# Patient Record
Sex: Female | Born: 1965 | Race: White | Hispanic: No | Smoking: Former smoker
Health system: Southern US, Community
[De-identification: ages and names within clinical notes are randomized; demographics above are authoritative.]

## PROBLEM LIST (undated history)

## (undated) DIAGNOSIS — F419 Anxiety disorder, unspecified: Secondary | ICD-10-CM

## (undated) DIAGNOSIS — K219 Gastro-esophageal reflux disease without esophagitis: Secondary | ICD-10-CM

## (undated) DIAGNOSIS — K7581 Nonalcoholic steatohepatitis (NASH): Secondary | ICD-10-CM

## (undated) DIAGNOSIS — E785 Hyperlipidemia, unspecified: Secondary | ICD-10-CM

## (undated) DIAGNOSIS — Z8719 Personal history of other diseases of the digestive system: Secondary | ICD-10-CM

## (undated) DIAGNOSIS — I2510 Atherosclerotic heart disease of native coronary artery without angina pectoris: Secondary | ICD-10-CM

## (undated) DIAGNOSIS — G629 Polyneuropathy, unspecified: Secondary | ICD-10-CM

## (undated) DIAGNOSIS — I255 Ischemic cardiomyopathy: Secondary | ICD-10-CM

## (undated) DIAGNOSIS — D763 Other histiocytosis syndromes: Secondary | ICD-10-CM

## (undated) DIAGNOSIS — D72829 Elevated white blood cell count, unspecified: Secondary | ICD-10-CM

## (undated) DIAGNOSIS — R001 Bradycardia, unspecified: Secondary | ICD-10-CM

## (undated) DIAGNOSIS — A0472 Enterocolitis due to Clostridium difficile, not specified as recurrent: Secondary | ICD-10-CM

## (undated) DIAGNOSIS — M797 Fibromyalgia: Secondary | ICD-10-CM

## (undated) DIAGNOSIS — J069 Acute upper respiratory infection, unspecified: Secondary | ICD-10-CM

## (undated) HISTORY — PX: SPLENECTOMY: SUR1306

## (undated) HISTORY — PX: ABDOMINAL HYSTERECTOMY: SHX81

## (undated) HISTORY — PX: TONSILLECTOMY: SUR1361

## (undated) HISTORY — DX: Elevated white blood cell count, unspecified: D72.829

---

## 1997-10-30 ENCOUNTER — Other Ambulatory Visit: Admission: RE | Admit: 1997-10-30 | Discharge: 1997-10-30 | Payer: Self-pay | Admitting: *Deleted

## 1999-04-29 ENCOUNTER — Other Ambulatory Visit: Admission: RE | Admit: 1999-04-29 | Discharge: 1999-04-29 | Payer: Self-pay | Admitting: *Deleted

## 2003-01-03 ENCOUNTER — Emergency Department (HOSPITAL_COMMUNITY): Admission: AD | Admit: 2003-01-03 | Discharge: 2003-01-03 | Payer: Self-pay | Admitting: Emergency Medicine

## 2005-06-16 ENCOUNTER — Other Ambulatory Visit: Admission: RE | Admit: 2005-06-16 | Discharge: 2005-06-16 | Payer: Self-pay | Admitting: General Surgery

## 2006-09-19 ENCOUNTER — Emergency Department (HOSPITAL_COMMUNITY): Admission: EM | Admit: 2006-09-19 | Discharge: 2006-09-19 | Payer: Self-pay | Admitting: Emergency Medicine

## 2007-02-25 ENCOUNTER — Ambulatory Visit (HOSPITAL_COMMUNITY): Payer: Self-pay | Admitting: Oncology

## 2007-02-25 ENCOUNTER — Encounter (HOSPITAL_COMMUNITY): Admission: RE | Admit: 2007-02-25 | Discharge: 2007-03-27 | Payer: Self-pay | Admitting: Oncology

## 2007-04-06 ENCOUNTER — Encounter (HOSPITAL_COMMUNITY): Admission: RE | Admit: 2007-04-06 | Discharge: 2007-04-20 | Payer: Self-pay | Admitting: Oncology

## 2007-05-31 ENCOUNTER — Encounter (HOSPITAL_COMMUNITY): Admission: RE | Admit: 2007-05-31 | Discharge: 2007-06-30 | Payer: Self-pay | Admitting: Oncology

## 2007-06-07 ENCOUNTER — Ambulatory Visit (HOSPITAL_COMMUNITY): Payer: Self-pay | Admitting: Oncology

## 2007-07-01 ENCOUNTER — Encounter (INDEPENDENT_AMBULATORY_CARE_PROVIDER_SITE_OTHER): Payer: Self-pay | Admitting: Surgery

## 2007-07-01 ENCOUNTER — Inpatient Hospital Stay (HOSPITAL_COMMUNITY): Admission: RE | Admit: 2007-07-01 | Discharge: 2007-07-07 | Payer: Self-pay | Admitting: Surgery

## 2007-07-14 ENCOUNTER — Encounter: Admission: RE | Admit: 2007-07-14 | Discharge: 2007-07-14 | Payer: Self-pay | Admitting: Surgery

## 2007-07-18 ENCOUNTER — Encounter (HOSPITAL_COMMUNITY): Admission: RE | Admit: 2007-07-18 | Discharge: 2007-08-17 | Payer: Self-pay | Admitting: Oncology

## 2007-08-09 ENCOUNTER — Ambulatory Visit (HOSPITAL_COMMUNITY): Payer: Self-pay | Admitting: Oncology

## 2007-08-15 ENCOUNTER — Inpatient Hospital Stay (HOSPITAL_COMMUNITY): Admission: EM | Admit: 2007-08-15 | Discharge: 2007-08-29 | Payer: Self-pay | Admitting: Emergency Medicine

## 2007-08-15 ENCOUNTER — Ambulatory Visit: Payer: Self-pay | Admitting: Oncology

## 2007-08-31 ENCOUNTER — Encounter (HOSPITAL_COMMUNITY): Admission: RE | Admit: 2007-08-31 | Discharge: 2007-09-30 | Payer: Self-pay | Admitting: Oncology

## 2008-02-02 ENCOUNTER — Encounter (HOSPITAL_COMMUNITY): Admission: RE | Admit: 2008-02-02 | Discharge: 2008-03-03 | Payer: Self-pay | Admitting: Oncology

## 2008-08-27 ENCOUNTER — Ambulatory Visit (HOSPITAL_COMMUNITY): Payer: Self-pay | Admitting: Oncology

## 2008-08-27 ENCOUNTER — Encounter (HOSPITAL_COMMUNITY): Admission: RE | Admit: 2008-08-27 | Discharge: 2008-09-26 | Payer: Self-pay | Admitting: Oncology

## 2008-09-10 ENCOUNTER — Emergency Department (HOSPITAL_COMMUNITY): Admission: EM | Admit: 2008-09-10 | Discharge: 2008-09-10 | Payer: Self-pay | Admitting: Emergency Medicine

## 2008-12-17 ENCOUNTER — Ambulatory Visit: Payer: Self-pay | Admitting: Orthopedic Surgery

## 2008-12-17 DIAGNOSIS — M7710 Lateral epicondylitis, unspecified elbow: Secondary | ICD-10-CM | POA: Insufficient documentation

## 2008-12-26 ENCOUNTER — Encounter (HOSPITAL_COMMUNITY): Admission: RE | Admit: 2008-12-26 | Discharge: 2009-01-17 | Payer: Self-pay | Admitting: Orthopedic Surgery

## 2008-12-26 ENCOUNTER — Encounter: Payer: Self-pay | Admitting: Orthopedic Surgery

## 2009-01-03 ENCOUNTER — Encounter: Payer: Self-pay | Admitting: Orthopedic Surgery

## 2009-01-11 ENCOUNTER — Emergency Department (HOSPITAL_COMMUNITY): Admission: EM | Admit: 2009-01-11 | Discharge: 2009-01-11 | Payer: Self-pay | Admitting: Emergency Medicine

## 2009-01-17 ENCOUNTER — Encounter: Payer: Self-pay | Admitting: Orthopedic Surgery

## 2009-01-23 ENCOUNTER — Encounter (INDEPENDENT_AMBULATORY_CARE_PROVIDER_SITE_OTHER): Payer: Self-pay | Admitting: *Deleted

## 2009-01-23 ENCOUNTER — Ambulatory Visit: Payer: Self-pay | Admitting: Orthopedic Surgery

## 2009-01-30 ENCOUNTER — Encounter: Payer: Self-pay | Admitting: Orthopedic Surgery

## 2009-03-13 ENCOUNTER — Encounter: Payer: Self-pay | Admitting: Orthopedic Surgery

## 2009-05-02 ENCOUNTER — Emergency Department (HOSPITAL_COMMUNITY): Admission: EM | Admit: 2009-05-02 | Discharge: 2009-05-02 | Payer: Self-pay | Admitting: Emergency Medicine

## 2010-05-11 ENCOUNTER — Encounter (HOSPITAL_COMMUNITY): Payer: Self-pay | Admitting: Oncology

## 2010-05-22 NOTE — Assessment & Plan Note (Signed)
Summary: 6 WK RE-CK RT ARM/[place] MEDICAID/CAF   Visit Type:  Follow-up Referring Provider:  Dr Rocco Serene   CC:  right elbow.  History of Present Illness: I saw [name] in the office today for a followup visit.  She is a 45 years old woman with the complaint of:  RIGHT ELBOW.  DX:   TENNIS ELBOW.  Treatment:   PT,  INJECTION, and brace   MEDS: RELAFEN 750 MG.  Complaints:   SHE STATES THAT HER ELBOW IS NOT ANY BETTER.  Today, scheduled for:   RECHECK.  any numbness ? YES SMALL, RING, AND LONG     Shoulder/Elbow Exam  General:    This is a normally developed, well-nourished, well-dressed female, who appears to be in some distress. She has pain in the RIGHT forearm and the extensors of the wrist. There is tenderness in the extensor carpi radialis, longus and brevis. There is numbness and tingling in the small, ring, and long finger. There is mild tenderness over the lateral epicondyle. There are no skin changes. Post and perfusion are normal.  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  Orders: Est. Patient Level III (64403)  Medications Added to Medication List This Visit: 1)  Neurontin 100 Mg Caps (Gabapentin) .Marland Kitchen.. 1 by mouth three times a day  Patient Instructions: 1)  NCS  2)  WRIST SPLINT FULL TIME  3)  NEURONTIN 100 MG three times a day  4)  RETURN WITH NCS REPORT  Prescriptions: NEURONTIN 100 MG CAPS (GABAPENTIN) 1 by mouth three times a day  #60 x 1   Entered and Authorized by:   Fuller Canada MD   Signed by:   Fuller Canada MD on 01/23/2009   Method used:   Print then Give to Patient   RxID:   [id number]

## 2010-05-22 NOTE — Letter (Signed)
Summary: History form  History form   Imported By: Jacklynn Ganong 12/25/2008 14:16:44  _____________________________________________________________________  External Attachment:    Type:   Image     Comment:   External Document

## 2010-05-22 NOTE — Miscellaneous (Signed)
Summary: ncs uppers faxed to [place]  Clinical Lists Changes  Orders: Added new Referral order of Neurology Referral (Neuro) - Signed

## 2010-05-22 NOTE — Miscellaneous (Signed)
Summary: OT Referral form  OT Referral form   Imported By: Jacklynn Ganong 01/08/2009 10:44:12  _____________________________________________________________________  External Attachment:    Type:   Image     Comment:   External Document

## 2010-05-22 NOTE — Assessment & Plan Note (Signed)
Summary: RT ARM PAIN,TENDONITIS/NEED XRAY/REF [place] MEDICA[place]   Visit Type:  Initial Consult Referring Razi Hickle:  Dr Rocco Serene   CC:  pain RIGHT elbow.  History of Present Illness: chief complaint: pain in RIGHT elbow.    45 year old female complains of several month history of pain over the RIGHT lateral elbow with numbness and radiation into the RIGHT hand, associated with numbness in the small, ring, and long finger. She has tried Relafen 750 mg twice a day and ice with no relief. She cannot lift a coffee cup, temperature teeth. Her pain is rated 8/10 at its worse. She is right-hand dominant.      Past History:  Past Medical History: status post hysterectomy, 1993 status post splenectomy 2009  History diabetes, and hypertension.  Family history of arthritis, diabetes.  Patient is married housewife, does not smoke or drink high school education was completed.  Medications include lisinopril 20 mg one a day. Metformin thousand milligrams 2 a day, and Relafen 750 mg twice a day  Review of Systems General:  Denies weight loss, weight gain, fever, chills, and fatigue. Cardiac :  Complains of blood clots; denies chest pain, angina, heart attack, heart failure, poor circulation, and phlebitis. Resp:  Complains of pneumonia; denies short of breath, difficulty breathing, COPD, and cough. GI:  Denies nausea, vomiting, diarrhea, constipation, difficulty swallowing, ulcers, GERD, and reflux. GU:  Denies kidney failure, kidney transplant, kidney stones, burning, poor stream, testicular cancer, blood in urine, and . Neuro:  Denies headache, dizziness, migraines, numbness, weakness, tremor, and unsteady walking. MS:  Denies joint pain, rheumatoid arthritis, joint swelling, gout, bone cancer, osteoporosis, and . Endo:  Complains of diabetes; denies thyroid disease and goiter. Psych:  Denies depression, mood swings, anxiety, panic attack, bipolar, and schizophrenia. Derm:  Denies  eczema, cancer, and itching. EENT:  Complains of poor vision; denies cataracts, glaucoma, poor hearing, vertigo, ears ringing, sinusitis, hoarseness, toothaches, and bleeding gums. Immunology:  Complains of seasonal allergies and sinus problems; denies allergic to bee stings. Lymphatic:  Denies lymph node cancer and lymph edema.  Physical Exam  Additional Exam:  Is a well-developed, well-nourished, female, abdomen, and hygiene, are intact. She is awake, alert, x3. Mood is normal.  She has normal sensation to soft touch over the RIGHT upper extremity, including the hand. She has good capillary refill and color.  She has a positive power grip test with tenderness over the lateral epicondyle, loss of about 10 of extension compared to her LEFT elbow. Flexion is normal. She is painful supination. Normal pronation.  There are no strength deficits in flexion or extension.  There is no lymphadenopathy or lymphangitis in the RIGHT axilla or epitrochlear region.  Skin is warm, dry, and intact. No rash.  LEFT arm has full range of motion with hyperextension of about 10 strength, stability and alignment of LEFT elbow noted.     Impression & Recommendations:  Problem # 1:  LATERAL EPICONDYLITIS, RIGHT (ICD-726.32) Assessment New  Orders: Physical Therapy Referral (PT) New Patient Level III (16109) Depo- Medrol 40mg  (J1030) Joint Aspirate / Injection, Intermediate (60454)  Medications Added to Medication List This Visit: 1)  Nabumetone 750 Mg Tabs (Nabumetone) .Marland Kitchen.. 1 by mouth two times a day  Patient Instructions: 1)  You have received an injection of cortisone today. You may experience increased pain at the injection site. Apply ice pack to the area for 20 minutes every 2 hours and take 2 xtra strength tylenol every 8 hours. This increased pain will usually resolve  in 24 hours. The injection will take effect in 3-10 days.  2)  PT @ [facility] 3)  Brace  4)  Read Tennis elbow brochure and  follw instructions  5)  return in 6 weeks  6)  continue relafen 750 mg two times a day  Prescriptions: NABUMETONE 750 MG TABS (NABUMETONE) 1 by mouth two times a day  #60 x 1   Entered and Authorized by:   Fuller Canada MD   Signed by:   Fuller Canada MD on 12/17/2008   Method used:   Handwritten   RxID:   [id number]

## 2010-05-22 NOTE — Miscellaneous (Signed)
Summary: OT Progress note  OT Progress note   Imported By: Jacklynn Ganong 01/21/2009 12:30:59  _____________________________________________________________________  External Attachment:    Type:   Image     Comment:   External Document

## 2010-05-22 NOTE — Miscellaneous (Signed)
Summary: Discharge from OT  Discharge from OT   Imported By: Jacklynn Ganong 03/26/2009 15:32:23  _____________________________________________________________________  External Attachment:    Type:   Image     Comment:   External Document

## 2010-05-22 NOTE — Miscellaneous (Signed)
Summary: OT Initial evaluation  OT Initial evaluation   Imported By: Jacklynn Ganong 12/31/2008 13:53:57  _____________________________________________________________________  External Attachment:    Type:   Image     Comment:   External Document

## 2010-07-25 LAB — COMPREHENSIVE METABOLIC PANEL
ALT: 45 U/L — ABNORMAL HIGH (ref 0–35)
AST: 32 U/L (ref 0–37)
Albumin: 4.3 g/dL (ref 3.5–5.2)
Alkaline Phosphatase: 72 U/L (ref 39–117)
BUN: 12 mg/dL (ref 6–23)
CO2: 30 mEq/L (ref 19–32)
Calcium: 10.3 mg/dL (ref 8.4–10.5)
Chloride: 98 mEq/L (ref 96–112)
Creatinine, Ser: 0.65 mg/dL (ref 0.4–1.2)
GFR calc Af Amer: >60 mL/min (ref >60)
GFR calc non Af Amer: >60 mL/min (ref >60)
Glucose, Bld: 197 mg/dL — ABNORMAL HIGH (ref 70–99)
Potassium: 4.1 mEq/L (ref 3.5–5.1)
Sodium: 137 mEq/L (ref 135–145)
Total Bilirubin: 0.4 mg/dL (ref 0.3–1.2)
Total Protein: 8.2 g/dL (ref 6.0–8.3)

## 2010-07-25 LAB — PROTIME-INR
INR: 0.9 (ref 0.00–1.49)
Prothrombin Time: 12.4 seconds (ref 11.6–15.2)

## 2010-07-25 LAB — CBC
HCT: 46.4 % — ABNORMAL HIGH (ref 36.0–46.0)
Hemoglobin: 16.1 g/dL — ABNORMAL HIGH (ref 12.0–15.0)
MCHC: 34.6 g/dL (ref 30.0–36.0)
MCV: 99.3 fL (ref 78.0–100.0)
Platelets: 364 10*3/uL (ref 150–400)
RBC: 4.67 MIL/uL (ref 3.87–5.11)
RDW: 13.8 % (ref 11.5–15.5)
WBC: 15.7 10*3/uL — ABNORMAL HIGH (ref 4.0–10.5)

## 2010-07-25 LAB — APTT: aPTT: 24 seconds (ref 24–37)

## 2010-07-25 LAB — DIFFERENTIAL
Basophils Absolute: 0.1 10*3/uL (ref 0.0–0.1)
Basophils Relative: 0 % (ref 0–1)
Eosinophils Absolute: 0.2 10*3/uL (ref 0.0–0.7)
Eosinophils Relative: 1 % (ref 0–5)
Lymphocytes Relative: 30 % (ref 12–46)
Lymphs Abs: 4.7 10*3/uL — ABNORMAL HIGH (ref 0.7–4.0)
Monocytes Absolute: 1.5 10*3/uL — ABNORMAL HIGH (ref 0.1–1.0)
Monocytes Relative: 10 % (ref 3–12)
Neutro Abs: 9.2 10*3/uL — ABNORMAL HIGH (ref 1.7–7.7)
Neutrophils Relative %: 59 % (ref 43–77)

## 2010-07-25 LAB — LIPASE, BLOOD: Lipase: 34 U/L (ref 11–59)

## 2010-07-29 LAB — COMPREHENSIVE METABOLIC PANEL
ALT: 29 U/L (ref 0–35)
AST: 20 U/L (ref 0–37)
Albumin: 3.9 g/dL (ref 3.5–5.2)
Alkaline Phosphatase: 78 U/L (ref 39–117)
BUN: 9 mg/dL (ref 6–23)
CO2: 24 mEq/L (ref 19–32)
Calcium: 9.4 mg/dL (ref 8.4–10.5)
Chloride: 103 mEq/L (ref 96–112)
Creatinine, Ser: 0.55 mg/dL (ref 0.4–1.2)
GFR calc Af Amer: >60 mL/min (ref >60)
GFR calc non Af Amer: >60 mL/min (ref >60)
Glucose, Bld: 185 mg/dL — ABNORMAL HIGH (ref 70–99)
Potassium: 4 mEq/L (ref 3.5–5.1)
Sodium: 135 mEq/L (ref 135–145)
Total Bilirubin: 0.6 mg/dL (ref 0.3–1.2)
Total Protein: 7.8 g/dL (ref 6.0–8.3)

## 2010-07-29 LAB — CBC
HCT: 44.6 % (ref 36.0–46.0)
Hemoglobin: 15.3 g/dL — ABNORMAL HIGH (ref 12.0–15.0)
MCHC: 34.3 g/dL (ref 30.0–36.0)
MCV: 98.6 fL (ref 78.0–100.0)
Platelets: 310 10*3/uL (ref 150–400)
RBC: 4.53 MIL/uL (ref 3.87–5.11)
RDW: 14.2 % (ref 11.5–15.5)
WBC: 13.7 10*3/uL — ABNORMAL HIGH (ref 4.0–10.5)

## 2010-07-29 LAB — DIFFERENTIAL
Basophils Absolute: 0.1 10*3/uL (ref 0.0–0.1)
Basophils Relative: 1 % (ref 0–1)
Eosinophils Absolute: 0.2 10*3/uL (ref 0.0–0.7)
Eosinophils Relative: 2 % (ref 0–5)
Lymphocytes Relative: 30 % (ref 12–46)
Lymphs Abs: 4.1 10*3/uL — ABNORMAL HIGH (ref 0.7–4.0)
Monocytes Absolute: 1.4 10*3/uL — ABNORMAL HIGH (ref 0.1–1.0)
Monocytes Relative: 10 % (ref 3–12)
Neutro Abs: 7.9 10*3/uL — ABNORMAL HIGH (ref 1.7–7.7)
Neutrophils Relative %: 58 % (ref 43–77)

## 2010-09-02 NOTE — Discharge Summary (Signed)
[name], [name]               ACCOUNT NO.:  [id number]   MEDICAL RECORD NO.:  [id number]          PATIENT TYPE:  INP   LOCATION:  1513                         FACILITY:  [facility]   PHYSICIAN:  Thornton Park. 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  Etiology of pain  uncertain, but thought to be related to her splenic and portal vein  thrombosis.   COURSE IN THE HOSPITAL:  This is a 45 year old lady from [place] who  developed some SMV, and portal vein thrombosis after lap spleen.  She  has developed this progression despite being on Lovenox at home.   She was seen here by Dr. Myrle Sheng and we began her on Coumadin.  Also she  was on underlying IV heparin during that period time.  Dr. __________  evaluated her scans and felt there was no indication for thrombolytics.  She has an underlying diagnosis of sea blue histiocytosis.  She  continued to have on and off bouts of pain and she had CT scans that did  not show any evidence of pancreatitis.  This was corroborated with  normal lipase, amylase studies.  She was frustrated by her pain but she  was reassured that there is nothing else that we had seen. We went over  the procedure in detail and she was finally felt to go home on NSAIDS  along with Percocet.  She was to continue her anticoagulants.  She was  given a prescription for Percocet 7.5/325 (number 40) and Coumadin 7.5  mg, was instructed to check with Dr. Mariel Sleet regarding maintenance of  her anticoagulation.  Condition is stable.      Thornton Park Daphine Deutscher, MD  Electronically Signed     MBM/MEDQ  D:   09/14/2007  T:  09/14/2007  Job:  [id number]   cc:   Ladona Horns. Mariel Sleet, MD  Fax: [phone]

## 2010-09-02 NOTE — Op Note (Signed)
NAMEKAMERYN, DAVERN               ACCOUNT NO.:  [id number]   MEDICAL RECORD NO.:  [id number]          PATIENT TYPE:  AMB   LOCATION:  DAY                          FACILITY:  [facility]   PHYSICIAN:  Thornton Park. Daphine Deutscher, MD  DATE OF BIRTH:  Sep 03, 1965   DATE OF PROCEDURE:  07/01/2007  DATE OF DISCHARGE:                               OPERATIVE REPORT   PREOPERATIVE DIAGNOSIS:  Sea-blue histiocytosis with splenomegaly.   POSTOPERATIVE DIAGNOSIS:  Sea-blue histiocytosis with splenomegaly.   PROCEDURE:  Laparoscopic splenectomy.   SURGEON:  Thornton Park. Daphine Deutscher, MD   ASSISTANT:  Currie Paris, MD   ANESTHESIA:  General.   DESCRIPTION OF PROCEDURE:  Shellyann Wandrey was taken to room 1 on July 01, 2007, and given general anesthesia.  She was placed in the leaning  spleen position using bean bag which is a right lateral decubitus.  The  abdomen and thorax on the left side predominantly were prepped with  Techni-Care and draped sterilely.  The abdomen was entered using a 5  Optiview sort of in the anterior axillary line entering without  difficulty and insufflating the abdomen.  A total of four 5 mm(s) were  placed and this gigantic spleen which was really stuck to everything was  taken down meticulously off the surrounding structures.  I only  encountered a little bit of bleeding on either side.  This was  controlled with a stapler.  I worked around her using an angled 30  degree 5 mm and freed it at the top where it was really intimately  associated with the stomach.   It was mobilized laterally, again using the Harmonic with control of  bleeding.  When I felt like I had completely isolated the pedicle and  felt like the splenic pedicle was away from the spleen I then elected to  enlarge my anterior axillary port going up first to 12 so that I could  insert the stapler.  Using the Covidien first a 45 and then a 60 and  then another 45 vascular staple load I went across the pedicle of  the  spleen.  On the second firing I kind of was coming right along the side  of the stomach but that all appeared to be intact and did not appear to  violate or resect any stomach with it although it was right on the edge.  The spleen was thus detached.  I then exchanged the 12 mm trocar for a  15 mm trocar and was able to insert the extraction bag through that and  was fortunate to be able to scoop up the spleen and get it to fit into  the bag and then bring it out through that port.  I then cut down on the  port, enlarging it to about 3 fingerbreadths and with that I was able to  pull the bag up through using the Xcel hand job port that I put into the  wound and then under very controlled circumstances cut up the spleen and  morcellated it and extracted it into a basin.  We then changed  our  gloves and all the equipment and there was complete isolation of the  splenic material as I removed it.   I repaired the extraction site in multiple layers using a running zero  Vicryl on the peritoneum and muscle layer and then the next muscle layer  of obliques closed with interrupted #1 PDS.  This area was injected with  Marcaine and ultimately closed with 4-0 Vicryl and Dermabond.  In the  meantime I inserted the scope and inspected.  We put in an  NG tube, blew up the stomach and inspected that and all looked good.  No  bleeding was noted and I then extracted the ports and closed these using  4-0 Vicryl as well.  Dermabond was used on all the wounds.  The patient  seemed to tolerate the procedure well and was taken to the recovery room  in satisfactory condition.      Thornton Park Daphine Deutscher, MD  Electronically Signed     MBM/MEDQ  D:  07/01/2007  T:  07/02/2007  Job:  [id number]   cc:   Ladona Horns. Mariel Sleet, MD  Fax: [phone]   [facility]

## 2010-09-02 NOTE — H&P (Signed)
[name], Foster               ACCOUNT NO.:  [id number]   MEDICAL RECORD NO.:  [id number]          PATIENT TYPE:  INP   LOCATION:  1513                         FACILITY:  [facility]   PHYSICIAN:  Alfonse Ras, MD   DATE OF BIRTH:  Nov 26, 1965   DATE OF ADMISSION:  08/15/2007  DATE OF DISCHARGE:                              HISTORY & PHYSICAL   REASON FOR ADMISSION:  Splenic vein and superior mesenteric vein  thrombosis status post laparoscopic splenectomy.   HISTORY OF PRESENT ILLNESS:  The patient is a 45 year old white female  now about 5 weeks status post a laparoscopic assisted splenectomy for  sea-blue histiocytosis.  Postoperatively, the patient did well until  postoperative day #5 when she was having increasing abdominal pain.  CT  scan at that time showed portal vein thrombosis.  The patient was seen  and started on therapeutic Lovenox and discharged home.  The patient has  been maintained on Lovenox without improvement in her symptomatology.  She denies any progression of her symptoms and denies any postprandial  pain.  She denies any nausea, vomiting, or diarrhea.  She denies any  fever, chills, or other constitutional symptoms.   PAST MEDICAL HISTORY:  Significant for and non-insulin dependent  diabetes mellitus and hypertension.   MEDICATIONS:  1. Aleve.  2. Lovenox 90 mg twice a day.  3. Simvastatin 40 mg a day.  4. Glucovance 2.5/500 mg b.i.d.  5. Enalapril 5 mg daily.  6. Hydromorphone 2 mg p.o. q.4 h. p.r.n. pain.   PHYSICAL EXAMINATION:  GENERAL:  On physical exam, she is an age-  appropriate obese white female in no distress.  VITAL SINGS:  Temperature is 98, blood pressure is 121/67, heart rate is  94.  HEENT:  Exam is benign.  Normocephalic and atraumatic.  LUNGS:  Clear to auscultation and percussion.  HEART:  Regular rate and rhythm without murmurs, rubs, or gallops.  ABDOMEN:  Obese, nontender.  She has a well-healed left upper quadrant  scar and  small laparoscopic scars.  No evidence of abdominal wall hernia  defects.  No evidence of fluid wave.   IMPRESSION:  Portal vein thrombosis extending down into the superior  mesenteric vein.   PLAN:  Admission, IV heparin, medical hematology consultation,  interventional radiology consultation for possible thrombolytic therapy.      Alfonse Ras, MD  Electronically Signed     KRE/MEDQ  D:  08/15/2007  T:  08/16/2007  Job:  [id number]   cc:   Ladona Horns. Mariel Sleet, MD  Fax: [phone]   Dani Gobble, MD  Fax: [phone]

## 2010-09-02 NOTE — Discharge Summary (Signed)
NAMECALEB, [name]               ACCOUNT NO.:  [id number]   MEDICAL RECORD NO.:  [id number]          PATIENT TYPE:  INP   LOCATION:  1538                         FACILITY:  [facility]   PHYSICIAN:  Thornton Park. Daphine Deutscher, MD  DATE OF BIRTH:  [birth year]   DATE OF ADMISSION:  07/01/2007  DATE OF DISCHARGE:  07/07/2007                               DISCHARGE SUMMARY   ADMITTING DIAGNOSIS:  Sea-blue histiocytosis.   DISCHARGE DIAGNOSIS:  Sea-blue histiocytosis.   PROCEDURE:  Laparoscopic splenectomy.   COURSE IN THE HOSPITAL:  [name] was an a.m. admission on March 13  and underwent laparoscopic splenectomy.  Postoperatively she did well  except having some pain in her midepigastrium.  I was out of town and  she was followed by Dr. Jamey Ripa and Dr. Johna Sheriff who then saw fit to  discharge her.  Arrangements were made for follow-up with me within the  following week.   FINAL DIAGNOSIS:  Sea-blue histiocytosis status post laparoscopic  splenectomy.      Thornton Park Daphine Deutscher, MD  Electronically Signed     MBM/MEDQ  D:  07/20/2007  T:  07/20/2007  Job:  [id number]   cc:   Ladona Horns. Mariel Sleet, MD  Fax: [phone]

## 2010-09-02 NOTE — Consult Note (Signed)
NAMEMARIANN, [name]               ACCOUNT NO.:  [id number]   MEDICAL RECORD NO.:  [id number]          PATIENT TYPE:  INP   LOCATION:  1513                         FACILITY:  [facility]   PHYSICIAN:  Leighton Roach. Truett Perna, M.D. DATE OF BIRTH:  July 21, 1965   DATE OF CONSULTATION:  08/16/2007  DATE OF DISCHARGE:                                 CONSULTATION   CONSULTING PHYSICIAN:  Dr. Truett Perna.   REASON FOR CONSULTATION:  Superior mesenteric vein, splenic vein  thrombosis.   REFERRING PHYSICIAN:  Dr. Luretha Murphy.   HISTORY OF PRESENT ILLNESS:  [name] is a pleasant, 45 year old  woman, patient of Dr. Mariel Sleet, with a history, recent diagnosis of sea  blue histiocytosis, with splenomegaly and thrombocytopenia, initially  seen by Dr. Mariel Sleet in [place] in March 2009, after undergoing  splenectomy by Dr. Colin Benton. The patient has been on Lovenox until  admission at 90 mg subcu b.i.d.  She was last seen by Dr. Mariel Sleet on  August 09, 2007, at which time, her abdominal pain was slightly better,  but not significantly improved. She was afebrile, and she had no  evidence of bleeding.  She was still tender to palpation in the left  upper quadrant to mild palpation.  A new CT scan was recommended, she  underwent this procedure on admission, after presenting with progressive  abdominal pain. Currently, she is on heparin per pharmacy, at 13.5 mL an  hour.  A new CT of the abdomen and pelvis shows thrombus throughout the  splenic vein, extending into the superior mesenteric vein, with no other  findings or masses.  Interventional radiology is evaluate the patient,  in order to determine if she is a candidate for thrombolytic therapy.  We were asked to see her, while she is being admitted in the hospital.   PAST MEDICAL HISTORY:  1. History of sea blue histiocytosis, as above.  2. NIDDM.  3. Hypertension.  4. Status post fracture of the left fifth metatarsal bone on September 19, 2006.   PAST  SURGICAL HISTORY:  1. Status post laparoscopic splenectomy July 01, 2007, Dr. Colin Benton.  2. Status post hysterectomy.   ALLERGIES:  NKDA.   MEDICATIONS:  1. Heparin per pharmacy.  2. Vasotec 5 mg daily.  3. DiaBeta 2.5 mg b.i.d.  4. Glucophage 500 mg b.i.d.  5. Morphine sulfate 2-4 mg q.4 h p.r.n.   REVIEW OF SYSTEMS:  Remarkable for increased fatigue, abdominal pain  with movement, denies any fever, chills, night sweats, headaches,  confusion, double vision, dysphagia, shortness of breath, productive  cough or chest pain.  Denies any GERD symptoms, vomiting, she has mild  nausea.  She denies any diarrhea, constipation, blood in the stools,  hematuria, dysuria, back pain, numbness or edema.  No weight loss or  failure to thrive.   FAMILY HISTORY:  Mother alive, with a history of psoriatic arthritis,  and elevated blood pressure.  Father alive with a history of diabetes  and peripheral vascular disease.  No sisters.  She has one brother at  52, with a diagnosis of blue histiocytosis,  status post splenectomy.  She has also another brother at 32, with what she calls signs of blue  histiocytosis.  Maternal grandmother has a history of pulmonary  embolism.   SOCIAL HISTORY:  The patient is married, she has four children, in good  health.  No tobacco or alcohol history. Lives in [place].   HEALTH MAINTENANCE:  Remarkable for last flu vaccine about 2 weeks ago,  physical about 2 weeks ago.   PHYSICAL EXAMINATION:  This is a 45 year old white female in no acute  distress, alert and oriented x3.  Blood pressure 122/75, pulse 69, respirations 18, pulse oximetry 97% on  room air, temperature 98, weight 192 pounds, height 66 inches.  HEENT:  Normocephalic, atraumatic.  PERRLA.  Oral mucosa without thrush  or lesions.  No evidence of gum bleeding.  NECK:  Supple.  No cervical or supraclavicular masses.  LUNGS:  Clear to auscultation bilaterally.  CARDIOVASCULAR:  Regular rate and  rhythm without murmurs, rubs or  gallops.  ABDOMEN:  Obese, tender in the mid low abdomen, more pronounced on the  left, but if diffuses across the right quadrant.  Incision clean and  dry. No hepatomegaly.  EXTREMITIES:  No clubbing or cyanosis.  No edema.  No inguinal masses.  SKIN:  Without bruising or petechial rash.  BREASTS:  Not examined.  GU AND RECTAL:  Deferred.  MUSCULOSKELETAL:  Without spinal tenderness.  NEURO:  Nonfocal.   LABS:  Hemoglobin of 12.9, hematocrit 38.2, white count 12.8, platelets  499, MCV 94.8, eosinophils 0.3, ANC 5.8, monocytes 1.5, basophils 0.3,  lymphocytes 3.3, sodium 136, potassium 3.6, BUN 5, creatinine 0.59,  glucose 256, calcium 8.9.   IMPRESSION:  Dr. Truett Perna has seen and evaluated the patient, as well as  discussed the case with Dr. Mariel Sleet, and reviewed the CTs with  radiology.   1. Status post splenectomy July 01, 2007.  2. Histiocytosis  3. Splenic - portal - superior mesenteric vein thrombosis, progressive      on Lovenox.  4. Pain, possibly related to abdominal venous thrombosis.  5. Family history of histiocytosis, in the brother.  6. Maternal grandmother with a history of pulmonary embolism.  7. Diabetes mellitus.  8. 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   Her only apparent  risk factor for the abdominal thrombosis has been the  recent surgery.  The abdominal pain is most likely related to the  thrombosis, as there is no other explanation for the pain.  However, the  CT does not show signs of bowel ischemia.   RECOMMENDATIONS:  1. Therapeutic IV heparin.  Anticoagulation should be at least for 7      days.  2. Coumadin anticoagulation to start after 3-4 days of therapeutic      heparin.  3. Check factor V Leiden gene mutation  4. Hematology will continue to follow her and then will follow the      patient in the outpatient basis in [place].   Thank you very much for allowing [place] the opportunity to participate in   the care of [name].      Marlowe Kays, P.A.      Leighton Roach. Truett Perna, M.D.  Electronically Signed    SW/MEDQ  D:  08/18/2007  T:  08/18/2007  Job:  [id number]   cc:   Melony Overly, PA   Ladona Horns. Mariel Sleet, MD  Fax: [phone]

## 2011-01-09 LAB — CBC
HCT: 40.2
Hemoglobin: 14.3
MCHC: 35.5
MCV: 98.3
Platelets: 113 — ABNORMAL LOW
RBC: 4.09
RDW: 13.5
WBC: 4.9

## 2011-01-09 LAB — COMPREHENSIVE METABOLIC PANEL
ALT: 47 — ABNORMAL HIGH
AST: 28
Albumin: 3.6
Alkaline Phosphatase: 61
BUN: 7
CO2: 22
Calcium: 8.6
Chloride: 106
Creatinine, Ser: 0.6
GFR calc Af Amer: >60
GFR calc non Af Amer: >60
Glucose, Bld: 189 — ABNORMAL HIGH
Potassium: 3.6
Sodium: 136
Total Bilirubin: 0.8
Total Protein: 6.7

## 2011-01-09 LAB — DIFFERENTIAL
Basophils Absolute: 0
Basophils Relative: 0
Eosinophils Absolute: 0
Eosinophils Relative: 1
Lymphocytes Relative: 22
Lymphs Abs: 1.1
Monocytes Absolute: 0.3
Monocytes Relative: 6
Neutro Abs: 3.5
Neutrophils Relative %: 72

## 2011-01-09 LAB — PROTIME-INR
INR: 1
Prothrombin Time: 13.3

## 2011-01-09 LAB — CHROMOSOME ANALYSIS, BONE MARROW

## 2011-01-09 LAB — BONE MARROW EXAM

## 2011-01-09 LAB — APTT: aPTT: 28

## 2011-01-12 LAB — CBC
HCT: 33.5 — ABNORMAL LOW
HCT: 42.8
HCT: 31.4 — ABNORMAL LOW
HCT: 34.1 — ABNORMAL LOW
HCT: 36.1
HCT: 38.8
Hemoglobin: 12
Hemoglobin: 15.1 — ABNORMAL HIGH
Hemoglobin: 11.1 — ABNORMAL LOW
Hemoglobin: 11.8 — ABNORMAL LOW
Hemoglobin: 12.7
Hemoglobin: 13.6
MCHC: 35.3
MCHC: 35.6
MCHC: 34.7
MCHC: 35
MCHC: 35.2
MCHC: 35.5
MCV: 98.1
MCV: 98.2
MCV: 100.7 — ABNORMAL HIGH
MCV: 96.9
MCV: 98.9
MCV: 99
Platelets: 132 — ABNORMAL LOW
Platelets: 148 — ABNORMAL LOW
Platelets: 181
Platelets: 234
Platelets: 498 — ABNORMAL HIGH
Platelets: 748 — ABNORMAL HIGH
RBC: 3.42 — ABNORMAL LOW
RBC: 4.36
RBC: 3.18 — ABNORMAL LOW
RBC: 3.38 — ABNORMAL LOW
RBC: 3.64 — ABNORMAL LOW
RBC: 4.01
RDW: 13.9
RDW: 14.1
RDW: 13.5
RDW: 13.6
RDW: 14.1
RDW: 14.6
WBC: 4.7
WBC: 8.7
WBC: 10.2
WBC: 11.1 — ABNORMAL HIGH
WBC: 12.1 — ABNORMAL HIGH
WBC: 9

## 2011-01-12 LAB — BASIC METABOLIC PANEL
BUN: 10
BUN: 2 — ABNORMAL LOW
BUN: 2 — ABNORMAL LOW
BUN: 2 — ABNORMAL LOW
CO2: 24
CO2: 29
CO2: 24
CO2: 27
Calcium: 7.9 — ABNORMAL LOW
Calcium: 8.5
Calcium: 8 — ABNORMAL LOW
Calcium: 8.4
Chloride: 104
Chloride: 99
Chloride: 104
Chloride: 107
Creatinine, Ser: 0.69
Creatinine, Ser: 0.83
Creatinine, Ser: 0.69
Creatinine, Ser: 0.82
GFR calc Af Amer: >60
GFR calc Af Amer: >60
GFR calc Af Amer: >60
GFR calc Af Amer: >60
GFR calc non Af Amer: >60
GFR calc non Af Amer: >60
GFR calc non Af Amer: >60
GFR calc non Af Amer: >60
Glucose, Bld: 214 — ABNORMAL HIGH
Glucose, Bld: 237 — ABNORMAL HIGH
Glucose, Bld: 182 — ABNORMAL HIGH
Glucose, Bld: 193 — ABNORMAL HIGH
Potassium: 3.1 — ABNORMAL LOW
Potassium: 3.2 — ABNORMAL LOW
Potassium: 3.4 — ABNORMAL LOW
Potassium: 4
Sodium: 132 — ABNORMAL LOW
Sodium: 137
Sodium: 136
Sodium: 138

## 2011-01-12 LAB — COMPREHENSIVE METABOLIC PANEL
ALT: 29
ALT: 30
AST: 19
AST: 24
Albumin: 2.9 — ABNORMAL LOW
Albumin: 3 — ABNORMAL LOW
Alkaline Phosphatase: 59
Alkaline Phosphatase: 79
BUN: 3 — ABNORMAL LOW
BUN: 5 — ABNORMAL LOW
CO2: 26
CO2: 28
Calcium: 8.3 — ABNORMAL LOW
Calcium: 8.8
Chloride: 100
Chloride: 101
Creatinine, Ser: 0.62
Creatinine, Ser: 0.64
GFR calc Af Amer: >60
GFR calc Af Amer: >60
GFR calc non Af Amer: >60
GFR calc non Af Amer: >60
Glucose, Bld: 184 — ABNORMAL HIGH
Glucose, Bld: 200 — ABNORMAL HIGH
Potassium: 3.7
Potassium: 3.8
Sodium: 133 — ABNORMAL LOW
Sodium: 135
Total Bilirubin: 0.4
Total Bilirubin: 0.6
Total Protein: 6.3
Total Protein: 7.2

## 2011-01-12 LAB — TYPE AND SCREEN
ABO/RH(D): O POS
Antibody Screen: NEGATIVE

## 2011-01-12 LAB — URINALYSIS, ROUTINE W REFLEX MICROSCOPIC
Bilirubin Urine: NEGATIVE
Glucose, UA: >1000 — AB
Hgb urine dipstick: NEGATIVE
Ketones, ur: NEGATIVE
Leukocytes, UA: NEGATIVE
Nitrite: NEGATIVE
Protein, ur: NEGATIVE
Specific Gravity, Urine: 1.044 — ABNORMAL HIGH
Urobilinogen, UA: 0.2
pH: 5.5

## 2011-01-12 LAB — DIFFERENTIAL
Basophils Absolute: 0.1
Basophils Absolute: 0.1
Basophils Relative: 1
Basophils Relative: 1
Eosinophils Absolute: 0.1
Eosinophils Absolute: 0.1
Eosinophils Relative: 2
Eosinophils Relative: 1
Lymphocytes Relative: 9 — ABNORMAL LOW
Lymphocytes Relative: 19
Lymphs Abs: 0.7
Lymphs Abs: 2.1
Monocytes Absolute: 1.4 — ABNORMAL HIGH
Monocytes Absolute: 1.8 — ABNORMAL HIGH
Monocytes Relative: 16 — ABNORMAL HIGH
Monocytes Relative: 16 — ABNORMAL HIGH
Neutro Abs: 6.4
Neutro Abs: 7
Neutrophils Relative %: 74
Neutrophils Relative %: 63

## 2011-01-12 LAB — HEMOGLOBIN A1C
Hgb A1c MFr Bld: 6.3 — ABNORMAL HIGH
Mean Plasma Glucose: 147

## 2011-01-12 LAB — URINE MICROSCOPIC-ADD ON

## 2011-01-12 LAB — ABO/RH: ABO/RH(D): O POS

## 2011-01-12 LAB — AMYLASE: Amylase: 21 — ABNORMAL LOW

## 2011-01-13 LAB — CBC
HCT: 38.2
HCT: 38.3
HCT: 40.4
HCT: 40.5
HCT: 43.1
Hemoglobin: 12.9
Hemoglobin: 13.3
Hemoglobin: 13.6
Hemoglobin: 13.7
Hemoglobin: 15
MCHC: 33.6
MCHC: 33.9
MCHC: 33.9
MCHC: 34.7
MCHC: 34.8
MCV: 94.2
MCV: 94.8
MCV: 94.8
MCV: 95.2
MCV: 95.4
Platelets: 481 — ABNORMAL HIGH
Platelets: 490 — ABNORMAL HIGH
Platelets: 499 — ABNORMAL HIGH
Platelets: 516 — ABNORMAL HIGH
Platelets: 544 — ABNORMAL HIGH
RBC: 4.03
RBC: 4.06
RBC: 4.24
RBC: 4.25
RBC: 4.54
RDW: 15.2
RDW: 15.4
RDW: 15.7 — ABNORMAL HIGH
RDW: 16.4 — ABNORMAL HIGH
RDW: 16.4 — ABNORMAL HIGH
WBC: 10.4
WBC: 11.2 — ABNORMAL HIGH
WBC: 12.4 — ABNORMAL HIGH
WBC: 9.2
WBC: 9.8

## 2011-01-13 LAB — HEPARIN ANTI-XA: Heparin LMW: <0.1

## 2011-01-13 LAB — BASIC METABOLIC PANEL
BUN: 5 — ABNORMAL LOW
CO2: 27
Calcium: 8.9
Chloride: 105
Creatinine, Ser: 0.59
GFR calc Af Amer: >60
GFR calc non Af Amer: >60
Glucose, Bld: 256 — ABNORMAL HIGH
Potassium: 3.6
Sodium: 136

## 2011-01-13 LAB — DIFFERENTIAL
Basophils Absolute: 0.3 — ABNORMAL HIGH
Basophils Relative: 3 — ABNORMAL HIGH
Eosinophils Absolute: 0.3
Eosinophils Relative: 3
Lymphocytes Relative: 30
Lymphs Abs: 3.3
Monocytes Absolute: 1.5 — ABNORMAL HIGH
Monocytes Relative: 13 — ABNORMAL HIGH
Neutro Abs: 5.8
Neutrophils Relative %: 51

## 2011-01-13 LAB — HEPARIN LEVEL (UNFRACTIONATED)
Heparin Unfractionated: 0.29 — ABNORMAL LOW
Heparin Unfractionated: 0.45
Heparin Unfractionated: 0.46
Heparin Unfractionated: 0.48

## 2011-01-13 LAB — APTT: aPTT: 30

## 2011-01-13 LAB — SAVE SMEAR

## 2011-01-13 LAB — PROTIME-INR
INR: 1
Prothrombin Time: 13

## 2011-01-13 LAB — LIPASE, BLOOD: Lipase: 58

## 2011-01-13 LAB — FACTOR 5 LEIDEN

## 2011-01-13 LAB — AMYLASE: Amylase: 53

## 2011-01-14 LAB — CBC
HCT: 40.9
Hemoglobin: 14.2
MCHC: 34.6
MCV: 94.5
Platelets: 416 — ABNORMAL HIGH
RBC: 4.33
RDW: 17 — ABNORMAL HIGH
WBC: 9.4

## 2011-01-14 LAB — PROTIME-INR
INR: 2.1 — ABNORMAL HIGH
INR: 2.4 — ABNORMAL HIGH
Prothrombin Time: 24.5 — ABNORMAL HIGH
Prothrombin Time: 27.3 — ABNORMAL HIGH

## 2011-01-23 LAB — CBC
HCT: 45
Hemoglobin: 15.5 — ABNORMAL HIGH
MCHC: 34.4
MCV: 98.8
Platelets: 123 — ABNORMAL LOW
RBC: 4.55
RDW: 13.8
WBC: 5.9

## 2011-01-23 LAB — POTASSIUM: Potassium: 3.7

## 2011-01-23 LAB — VITAMIN B12: Vitamin B-12: 476 (ref 211–911)

## 2011-01-27 LAB — IRON AND TIBC
Iron: 124
Saturation Ratios: 36
TIBC: 341
UIBC: 217

## 2011-01-27 LAB — DIFFERENTIAL
Basophils Absolute: 0
Basophils Relative: 1
Eosinophils Absolute: 0.1
Eosinophils Relative: 2
Lymphocytes Relative: 28
Lymphs Abs: 2
Monocytes Absolute: 0.5
Monocytes Relative: 7
Neutro Abs: 4.5
Neutrophils Relative %: 62

## 2011-01-27 LAB — TSH: TSH: 2.393

## 2011-01-27 LAB — COMPREHENSIVE METABOLIC PANEL
ALT: 46 — ABNORMAL HIGH
AST: 30
Albumin: 3.9
Alkaline Phosphatase: 79
BUN: 8
CO2: 24
Calcium: 9.4
Chloride: 104
Creatinine, Ser: 0.69
GFR calc Af Amer: >60
GFR calc non Af Amer: >60
Glucose, Bld: 170 — ABNORMAL HIGH
Potassium: 3.2 — ABNORMAL LOW
Sodium: 137
Total Bilirubin: 1
Total Protein: 7.4

## 2011-01-27 LAB — CBC
HCT: 44.1
Hemoglobin: 15.8 — ABNORMAL HIGH
MCHC: 35.9
MCV: 98.1
Platelets: 145 — ABNORMAL LOW
RBC: 4.5
RDW: 13.3
WBC: 7.2

## 2011-01-27 LAB — T4, FREE: Free T4: 1.15

## 2011-01-27 LAB — FOLATE: Folate: 8.6

## 2011-01-27 LAB — FERRITIN: Ferritin: 278 (ref 10–291)

## 2011-01-27 LAB — HIV ANTIBODY (ROUTINE TESTING W REFLEX): HIV: NONREACTIVE

## 2011-08-05 ENCOUNTER — Inpatient Hospital Stay (HOSPITAL_COMMUNITY)
Admission: EM | Admit: 2011-08-05 | Discharge: 2011-08-12 | DRG: 182 | Disposition: A | Discharge status: Home / Self Care | Payer: BC Managed Care – PPO | Attending: Internal Medicine | Admitting: Internal Medicine

## 2011-08-05 ENCOUNTER — Emergency Department (HOSPITAL_COMMUNITY): Payer: BC Managed Care – PPO

## 2011-08-05 ENCOUNTER — Encounter (HOSPITAL_COMMUNITY): Payer: Self-pay | Admitting: *Deleted

## 2011-08-05 DIAGNOSIS — K5100 Ulcerative (chronic) pancolitis without complications: Secondary | ICD-10-CM | POA: Diagnosis present

## 2011-08-05 DIAGNOSIS — E1165 Type 2 diabetes mellitus with hyperglycemia: Secondary | ICD-10-CM

## 2011-08-05 DIAGNOSIS — M7710 Lateral epicondylitis, unspecified elbow: Secondary | ICD-10-CM

## 2011-08-05 DIAGNOSIS — I10 Essential (primary) hypertension: Secondary | ICD-10-CM | POA: Diagnosis present

## 2011-08-05 DIAGNOSIS — K529 Noninfective gastroenteritis and colitis, unspecified: Secondary | ICD-10-CM | POA: Diagnosis present

## 2011-08-05 DIAGNOSIS — E7800 Pure hypercholesterolemia, unspecified: Secondary | ICD-10-CM | POA: Diagnosis present

## 2011-08-05 DIAGNOSIS — D72829 Elevated white blood cell count, unspecified: Secondary | ICD-10-CM | POA: Diagnosis present

## 2011-08-05 DIAGNOSIS — IMO0001 Reserved for inherently not codable concepts without codable children: Secondary | ICD-10-CM | POA: Diagnosis present

## 2011-08-05 DIAGNOSIS — Z6834 Body mass index (BMI) 34.0-34.9, adult: Secondary | ICD-10-CM

## 2011-08-05 DIAGNOSIS — R109 Unspecified abdominal pain: Secondary | ICD-10-CM | POA: Diagnosis present

## 2011-08-05 DIAGNOSIS — E669 Obesity, unspecified: Secondary | ICD-10-CM | POA: Diagnosis present

## 2011-08-05 DIAGNOSIS — E785 Hyperlipidemia, unspecified: Secondary | ICD-10-CM | POA: Diagnosis present

## 2011-08-05 DIAGNOSIS — A0472 Enterocolitis due to Clostridium difficile, not specified as recurrent: Principal | ICD-10-CM | POA: Diagnosis present

## 2011-08-05 DIAGNOSIS — E119 Type 2 diabetes mellitus without complications: Secondary | ICD-10-CM | POA: Diagnosis present

## 2011-08-05 LAB — CBC
HCT: 44.5 % (ref 36.0–46.0)
Hemoglobin: 15.4 g/dL — ABNORMAL HIGH (ref 12.0–15.0)
MCH: 33 pg (ref 26.0–34.0)
MCHC: 34.6 g/dL (ref 30.0–36.0)
MCV: 95.5 fL (ref 78.0–100.0)
Platelets: 468 10*3/uL — ABNORMAL HIGH (ref 150–400)
RBC: 4.66 MIL/uL (ref 3.87–5.11)
RDW: 14.4 % (ref 11.5–15.5)
WBC: 23.4 10*3/uL — ABNORMAL HIGH (ref 4.0–10.5)

## 2011-08-05 LAB — COMPREHENSIVE METABOLIC PANEL
ALT: 57 U/L — ABNORMAL HIGH (ref 0–35)
AST: 33 U/L (ref 0–37)
Albumin: 3.3 g/dL — ABNORMAL LOW (ref 3.5–5.2)
Alkaline Phosphatase: 71 U/L (ref 39–117)
BUN: 8 mg/dL (ref 6–23)
CO2: 23 mEq/L (ref 19–32)
Calcium: 8.9 mg/dL (ref 8.4–10.5)
Chloride: 96 mEq/L (ref 96–112)
Creatinine, Ser: 0.61 mg/dL (ref 0.50–1.10)
GFR calc Af Amer: >90 mL/min (ref >90)
GFR calc non Af Amer: >90 mL/min (ref >90)
Glucose, Bld: 228 mg/dL — ABNORMAL HIGH (ref 70–99)
Potassium: 3.4 mEq/L — ABNORMAL LOW (ref 3.5–5.1)
Sodium: 134 mEq/L — ABNORMAL LOW (ref 135–145)
Total Bilirubin: 0.4 mg/dL (ref 0.3–1.2)
Total Protein: 7.3 g/dL (ref 6.0–8.3)

## 2011-08-05 LAB — DIFFERENTIAL
Basophils Absolute: 0.1 10*3/uL (ref 0.0–0.1)
Basophils Relative: 0 % (ref 0–1)
Eosinophils Absolute: 0.1 10*3/uL (ref 0.0–0.7)
Eosinophils Relative: 0 % (ref 0–5)
Lymphocytes Relative: 17 % (ref 12–46)
Lymphs Abs: 3.9 10*3/uL (ref 0.7–4.0)
Monocytes Absolute: 3.1 10*3/uL — ABNORMAL HIGH (ref 0.1–1.0)
Monocytes Relative: 13 % — ABNORMAL HIGH (ref 3–12)
Neutro Abs: 16.2 10*3/uL — ABNORMAL HIGH (ref 1.7–7.7)
Neutrophils Relative %: 70 % (ref 43–77)

## 2011-08-05 LAB — LIPASE, BLOOD: Lipase: 12 U/L (ref 11–59)

## 2011-08-05 MED ORDER — ONDANSETRON HCL 4 MG/2ML IJ SOLN
4.0000 mg | Freq: Once | INTRAMUSCULAR | Status: AC
  Administered 2011-08-05: 4 mg via INTRAVENOUS
  Filled 2011-08-05: qty 2

## 2011-08-05 MED ORDER — PANTOPRAZOLE SODIUM 40 MG IV SOLR
40.0000 mg | Freq: Once | INTRAVENOUS | Status: AC
  Administered 2011-08-05: 40 mg via INTRAVENOUS
  Filled 2011-08-05: qty 40

## 2011-08-05 MED ORDER — HYDROMORPHONE HCL PF 1 MG/ML IJ SOLN
1.0000 mg | Freq: Once | INTRAMUSCULAR | Status: AC
  Administered 2011-08-05: 1 mg via INTRAVENOUS
  Filled 2011-08-05: qty 1

## 2011-08-05 MED ORDER — SODIUM CHLORIDE 0.9 % IV BOLUS (SEPSIS)
1000.0000 mL | Freq: Once | INTRAVENOUS | Status: AC
  Administered 2011-08-05: 1000 mL via INTRAVENOUS

## 2011-08-05 MED ORDER — IOHEXOL 300 MG/ML  SOLN: 100.0000 mL | Freq: Once | INTRAMUSCULAR | Status: AC | PRN

## 2011-08-05 NOTE — ED Notes (Signed)
abd cramping with n/v/d since last Friday.

## 2011-08-05 NOTE — ED Provider Notes (Signed)
History     CSN: [id number]  Arrival date & time 08/05/11  1702   First MD Initiated Contact with Patient 08/05/11 2301      Chief Complaint  Patient presents with  . Abdominal Pain  . n/v/d     (Consider location/radiation/quality/duration/timing/severity/associated sxs/prior treatment) HPI Comments: 46 y/o female with onset of crampy abd pain that started 5 days ago - has been constant, gradually worsening and comes in waves.  She has been unable to eat since yesterday, has several episodes of vomiting and some small loose stools.  She has had prior hysterectomy and splenectomy.  She denies alcohol or tobacco use. She has no fevers chills cough shortness of breath swelling rashes headache or sore throat. Symptoms are severe at this time  Patient is a 46 y.o. female presenting with abdominal pain. The history is provided by the patient and the spouse.  Abdominal Pain The primary symptoms of the illness include abdominal pain.    Past Medical History  Diagnosis Date  . Diabetes mellitus   . Hypertension   . High cholesterol     Past Surgical History  Procedure Date  . Splenectomy   . Tonsillectomy   . Abdominal hysterectomy     No family history on file.  History  Substance Use Topics  . Smoking status: Never Smoker   . Smokeless tobacco: Not on file  . Alcohol Use: No    OB History    Grav Para Term Preterm Abortions TAB SAB Ect Mult Living                  Review of Systems  Gastrointestinal: Positive for abdominal pain.  All other systems reviewed and are negative.    Allergies  Aspartame and phenylalanine  Home Medications   Current Outpatient Rx  Name Route Sig Dispense Refill  . BISMUTH SUBSALICYLATE 262 MG/15ML PO SUSP Oral Take 15 mLs by mouth every 6 (six) hours as needed.    . CYCLOBENZAPRINE HCL 10 MG PO TABS Oral Take 10 mg by mouth 3 (three) times daily as needed.    Marland Kitchen FLUOXETINE HCL 20 MG PO CAPS Oral Take 20 mg by mouth daily.    Marland Kitchen  GLIPIZIDE 10 MG PO TABS Oral Take 10 mg by mouth 2 (two) times daily before a meal.    . LISINOPRIL 20 MG PO TABS Oral Take 20 mg by mouth daily.    Marland Kitchen METFORMIN HCL 1000 MG PO TABS Oral Take 1,000 mg by mouth 2 (two) times daily.    Marland Kitchen PANTOPRAZOLE SODIUM 40 MG PO TBEC Oral Take 40 mg by mouth daily.    Marland Kitchen PRAVASTATIN SODIUM 20 MG PO TABS Oral Take 20 mg by mouth daily.    Marland Kitchen SAXAGLIPTIN HCL 5 MG PO TABS Oral Take 5 mg by mouth daily.      BP 100/68  Pulse 77  Temp(Src) 98.1 F (36.7 C) (Oral)  Resp 16  Ht 5\' 2"  (1.575 m)  Wt 178 lb (80.74 kg)  BMI 32.56 kg/m2  SpO2 97%  Physical Exam  Nursing note and vitals reviewed. Constitutional: She appears well-developed and well-nourished.       Uncomfortable appearing  HENT:  Head: Normocephalic and atraumatic.  Mouth/Throat: Oropharynx is clear and moist. No oropharyngeal exudate.  Eyes: Conjunctivae and EOM are normal. Pupils are equal, round, and reactive to light. Right eye exhibits no discharge. Left eye exhibits no discharge. No scleral icterus.  Neck: Normal range of motion. Neck  supple. No JVD present. No thyromegaly present.  Cardiovascular: Normal rate, regular rhythm, normal heart sounds and intact distal pulses.  Exam reveals no gallop and no friction rub.   No murmur heard. Pulmonary/Chest: Effort normal and breath sounds normal. No respiratory distress. She has no wheezes. She has no rales.  Abdominal: Soft. Bowel sounds are normal. She exhibits no distension and no mass. There is tenderness ( Upper abdominal tenderness, right upper, left upper, epigastric. Has mild lower abdominal tenderness. Non-peritoneal, no guarding, no masses).  Musculoskeletal: Normal range of motion. She exhibits no edema and no tenderness.  Lymphadenopathy:    She has no cervical adenopathy.  Neurological: She is alert. Coordination normal.  Skin: Skin is warm and dry. No rash noted. No erythema.  Psychiatric: She has a normal mood and affect. Her  behavior is normal.    ED Course  Procedures (including critical care time)  Labs Reviewed  CBC - Abnormal; Notable for the following:    WBC 23.4 (*)    Hemoglobin 15.4 (*)    Platelets 468 (*)    All other components within normal limits  DIFFERENTIAL - Abnormal; Notable for the following:    Neutro Abs 16.2 (*)    Monocytes Relative 13 (*)    Monocytes Absolute 3.1 (*)    All other components within normal limits  COMPREHENSIVE METABOLIC PANEL - Abnormal; Notable for the following:    Sodium 134 (*)    Potassium 3.4 (*)    Glucose, Bld 228 (*)    Albumin 3.3 (*)    ALT 57 (*)    All other components within normal limits  LIPASE, BLOOD   Ct Abdomen Pelvis W Contrast  08/06/2011  *RADIOLOGY REPORT*  Clinical Data: Abdominal pain cramping, nausea and vomiting. History splenectomy hysterectomy.  CT ABDOMEN AND PELVIS WITH CONTRAST  Technique:  Multidetector CT imaging of the abdomen and pelvis was performed following the standard protocol during bolus administration of intravenous contrast.  Contrast:  100 ml  Comparison: .  CT abdomen pelvis 01/11/2009  Findings:  The lung bases are clear.  There is fatty infiltration of the liver.  No focal hepatic mass. Patient is status post splenectomy.  There is cavernous transformation of the portal vein, a chronic finding, unchanged compared to prior CT examinations.  There is no biliary ductal dilatation.  The pancreas, adrenal glands, and kidneys are within normal limits.  The appendix is normal.  There is circumferential wall thickening of the colon extending from the cecum through the distal sigmoid colon consistent with pancolitis.  There is injection of the vasculature surrounding the colon.  There is slight stranding in the pericolonic mesenteries.  There is no evidence of bowel obstruction.  Small bowel loops appear to have normal wall thickness.  Abdominal aorta is normal in caliber.  Urinary bladder is within normal limits.  Patient is  status post hysterectomy.  There is no free intraperitoneal air or evidence of abscess.  IMPRESSION:  1.  Pancolitis.  The wall of the entire colon is circumferentially thickened and there is prominence of the pericolonic vasculature and slight pericolonic fat stranding.  Findings could be due to infectious colitis or inflammatory bowel disease.  Consider testing to exclude C difficile colitis. 2.  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 3.  Fatty infiltration of the liver.  Original Report Authenticated By: Britta Mccreedy, M.D.     1. Pancolitis       MDM  The patient appears  dehydrated, has a borderline tachycardia with a pulse of 100 and significant abdominal pain. 2 lower choir IV fluids, pain medications, nausea medicines as well as laboratory workup. Thus far her CBC shows a significant leukocytosis with a white blood cell count of 23,000, as well as a cooperative metabolic panel showing a glucose of 228, normal liver function tests and a normal renal function. Her lipase is normal as well. She has had a prior abdominal surgery with both a splenectomy and hysterectomy thus there is a possibility for adhesions and small bowel obstruction. CT scan ordered, labs ordered, will reevaluate.  CT scan reviewed and shows pancolitis. Antibiotics ordered, patient appears medically stable, no signs of perforation. I discussed the care with the [facility] hospitalist who will admit for further evaluation and ongoing antibiotics.        Vida Roller, MD 08/06/11 [id number]

## 2011-08-06 ENCOUNTER — Encounter (HOSPITAL_COMMUNITY): Payer: Self-pay | Admitting: Internal Medicine

## 2011-08-06 DIAGNOSIS — E119 Type 2 diabetes mellitus without complications: Secondary | ICD-10-CM | POA: Diagnosis present

## 2011-08-06 DIAGNOSIS — E1165 Type 2 diabetes mellitus with hyperglycemia: Secondary | ICD-10-CM | POA: Diagnosis present

## 2011-08-06 DIAGNOSIS — K5100 Ulcerative (chronic) pancolitis without complications: Secondary | ICD-10-CM | POA: Diagnosis present

## 2011-08-06 DIAGNOSIS — I10 Essential (primary) hypertension: Secondary | ICD-10-CM | POA: Diagnosis present

## 2011-08-06 DIAGNOSIS — E785 Hyperlipidemia, unspecified: Secondary | ICD-10-CM | POA: Diagnosis present

## 2011-08-06 LAB — GLUCOSE, CAPILLARY
Glucose-Capillary: 201 mg/dL — ABNORMAL HIGH (ref 70–99)
Glucose-Capillary: 213 mg/dL — ABNORMAL HIGH (ref 70–99)
Glucose-Capillary: 231 mg/dL — ABNORMAL HIGH (ref 70–99)
Glucose-Capillary: 99 mg/dL (ref 70–99)

## 2011-08-06 LAB — HEMOGLOBIN A1C
Hgb A1c MFr Bld: 8.9 % — ABNORMAL HIGH (ref <5.7)
Mean Plasma Glucose: 209 mg/dL — ABNORMAL HIGH (ref <117)

## 2011-08-06 LAB — MAGNESIUM: Magnesium: 1.6 mg/dL (ref 1.5–2.5)

## 2011-08-06 LAB — CLOSTRIDIUM DIFFICILE BY PCR: Toxigenic C. Difficile by PCR: POSITIVE — AB

## 2011-08-06 MED ORDER — INSULIN ASPART 100 UNIT/ML ~~LOC~~ SOLN
0.0000 [IU] | Freq: Every day | SUBCUTANEOUS | Status: DC
  Administered 2011-08-10 – 2011-08-11 (×2): 2 [IU] via SUBCUTANEOUS
  Filled 2011-08-06: qty 1

## 2011-08-06 MED ORDER — ONDANSETRON HCL 4 MG/2ML IJ SOLN: 4.0000 mg | INTRAMUSCULAR | Status: DC | PRN

## 2011-08-06 MED ORDER — CIPROFLOXACIN IN D5W 400 MG/200ML IV SOLN
400.0000 mg | Freq: Once | INTRAVENOUS | Status: AC
  Administered 2011-08-06: 400 mg via INTRAVENOUS
  Filled 2011-08-06: qty 200

## 2011-08-06 MED ORDER — ONDANSETRON HCL 4 MG PO TABS: 4.0000 mg | ORAL_TABLET | Freq: Four times a day (QID) | ORAL | Status: DC | PRN

## 2011-08-06 MED ORDER — ACETAMINOPHEN 325 MG PO TABS
650.0000 mg | ORAL_TABLET | Freq: Four times a day (QID) | ORAL | Status: DC | PRN
  Administered 2011-08-08: 650 mg via ORAL
  Filled 2011-08-06: qty 2

## 2011-08-06 MED ORDER — HYDROMORPHONE HCL PF 1 MG/ML IJ SOLN
1.0000 mg | INTRAMUSCULAR | Status: AC | PRN
  Administered 2011-08-06 (×2): 1 mg via INTRAVENOUS
  Administered 2011-08-06 (×2): 0.5 mg via INTRAVENOUS
  Filled 2011-08-06 (×4): qty 1

## 2011-08-06 MED ORDER — METRONIDAZOLE 500 MG PO TABS
500.0000 mg | ORAL_TABLET | Freq: Three times a day (TID) | ORAL | Status: DC
  Administered 2011-08-06 – 2011-08-09 (×9): 500 mg via ORAL
  Filled 2011-08-06 (×8): qty 1

## 2011-08-06 MED ORDER — CYCLOBENZAPRINE HCL 10 MG PO TABS: 10.0000 mg | ORAL_TABLET | Freq: Three times a day (TID) | ORAL | Status: DC | PRN

## 2011-08-06 MED ORDER — ACETAMINOPHEN 650 MG RE SUPP: 650.0000 mg | Freq: Four times a day (QID) | RECTAL | Status: DC | PRN

## 2011-08-06 MED ORDER — DIPHENHYDRAMINE HCL 25 MG PO CAPS: 50.0000 mg | ORAL_CAPSULE | ORAL | Status: DC | PRN

## 2011-08-06 MED ORDER — FLORA-Q PO CAPS
1.0000 | ORAL_CAPSULE | Freq: Every day | ORAL | Status: DC
  Administered 2011-08-06 – 2011-08-11 (×6): 1 via ORAL
  Filled 2011-08-06 (×7): qty 1

## 2011-08-06 MED ORDER — DIPHENHYDRAMINE HCL 50 MG/ML IJ SOLN
25.0000 mg | Freq: Once | INTRAMUSCULAR | Status: AC
  Administered 2011-08-06: 25 mg via INTRAVENOUS
  Filled 2011-08-06: qty 1

## 2011-08-06 MED ORDER — HYDROMORPHONE HCL PF 1 MG/ML IJ SOLN
1.0000 mg | Freq: Once | INTRAMUSCULAR | Status: AC
  Administered 2011-08-06: 1 mg via INTRAVENOUS
  Filled 2011-08-06: qty 1

## 2011-08-06 MED ORDER — ONDANSETRON HCL 4 MG/2ML IJ SOLN: 4.0000 mg | Freq: Three times a day (TID) | INTRAMUSCULAR | Status: AC | PRN

## 2011-08-06 MED ORDER — ENOXAPARIN SODIUM 40 MG/0.4ML ~~LOC~~ SOLN
40.0000 mg | SUBCUTANEOUS | Status: DC
  Administered 2011-08-06 – 2011-08-12 (×7): 40 mg via SUBCUTANEOUS
  Filled 2011-08-06 (×7): qty 0.4

## 2011-08-06 MED ORDER — INSULIN ASPART 100 UNIT/ML ~~LOC~~ SOLN
0.0000 [IU] | Freq: Three times a day (TID) | SUBCUTANEOUS | Status: DC
  Administered 2011-08-06: 18:00:00 via SUBCUTANEOUS
  Administered 2011-08-07 – 2011-08-08 (×5): 3 [IU] via SUBCUTANEOUS
  Administered 2011-08-08 – 2011-08-09 (×2): 4 [IU] via SUBCUTANEOUS
  Administered 2011-08-09 (×2): 2 [IU] via SUBCUTANEOUS
  Administered 2011-08-10: 5 [IU] via SUBCUTANEOUS
  Administered 2011-08-10: 2 [IU] via SUBCUTANEOUS
  Administered 2011-08-11 (×2): 5 [IU] via SUBCUTANEOUS
  Administered 2011-08-11: 2 [IU] via SUBCUTANEOUS
  Administered 2011-08-12: 5 [IU] via SUBCUTANEOUS

## 2011-08-06 MED ORDER — FLUOXETINE HCL 20 MG PO CAPS
20.0000 mg | ORAL_CAPSULE | Freq: Every day | ORAL | Status: DC
  Administered 2011-08-06 – 2011-08-11 (×6): 20 mg via ORAL
  Filled 2011-08-06 (×6): qty 1

## 2011-08-06 MED ORDER — FAMOTIDINE IN NACL 20-0.9 MG/50ML-% IV SOLN
20.0000 mg | Freq: Two times a day (BID) | INTRAVENOUS | Status: DC
  Administered 2011-08-06 – 2011-08-10 (×9): 20 mg via INTRAVENOUS
  Filled 2011-08-06 (×11): qty 50

## 2011-08-06 MED ORDER — POTASSIUM CHLORIDE IN NACL 40-0.9 MEQ/L-% IV SOLN
INTRAVENOUS | Status: AC
  Filled 2011-08-06: qty 1000

## 2011-08-06 MED ORDER — DIPHENHYDRAMINE HCL 50 MG/ML IJ SOLN
25.0000 mg | Freq: Once | INTRAMUSCULAR | Status: AC
  Administered 2011-08-06: 50 mg via INTRAVENOUS
  Filled 2011-08-06: qty 1

## 2011-08-06 MED ORDER — HYDROMORPHONE HCL PF 1 MG/ML IJ SOLN
0.5000 mg | INTRAMUSCULAR | Status: DC | PRN
  Administered 2011-08-06 – 2011-08-09 (×27): 0.5 mg via INTRAVENOUS
  Filled 2011-08-06 (×29): qty 1

## 2011-08-06 MED ORDER — TRAZODONE HCL 50 MG PO TABS: 25.0000 mg | ORAL_TABLET | Freq: Every evening | ORAL | Status: DC | PRN

## 2011-08-06 MED ORDER — CIPROFLOXACIN IN D5W 400 MG/200ML IV SOLN
400.0000 mg | Freq: Two times a day (BID) | INTRAVENOUS | Status: DC
  Administered 2011-08-06 – 2011-08-07 (×2): 400 mg via INTRAVENOUS
  Filled 2011-08-06 (×3): qty 200

## 2011-08-06 MED ORDER — SODIUM CHLORIDE 0.9 % IV SOLN: INTRAVENOUS | Status: AC

## 2011-08-06 MED ORDER — METRONIDAZOLE IN NACL 5-0.79 MG/ML-% IV SOLN
500.0000 mg | Freq: Once | INTRAVENOUS | Status: AC
  Administered 2011-08-06: 500 mg via INTRAVENOUS
  Filled 2011-08-06: qty 100

## 2011-08-06 MED ORDER — SIMVASTATIN 10 MG PO TABS
10.0000 mg | ORAL_TABLET | Freq: Every day | ORAL | Status: DC
  Administered 2011-08-06: 10 mg via ORAL
  Filled 2011-08-06: qty 1

## 2011-08-06 MED ORDER — DIPHENHYDRAMINE HCL 50 MG/ML IJ SOLN: 25.0000 mg | Freq: Four times a day (QID) | INTRAMUSCULAR | Status: DC | PRN

## 2011-08-06 MED ORDER — POTASSIUM CHLORIDE 2 MEQ/ML IV SOLN
INTRAVENOUS | Status: DC
  Administered 2011-08-06 – 2011-08-11 (×6): via INTRAVENOUS
  Filled 2011-08-06 (×13): qty 1000

## 2011-08-06 NOTE — Progress Notes (Signed)
Patient admitted by Dr. Orvan Falconer earlier today for colitis.  Patient seen and examined, database reviewed.  She reports being on amoxicillin 2 weeks ago for a sinus infection  She is on abx, C diff has been sent.    Continue on clear liquids for now, advance as tolerated.  Will follow.

## 2011-08-06 NOTE — H&P (Signed)
PCP:  Dixie Dials, nurse practitioner [facility] health Department  Chief Complaint:  Abdominal pain and loose stools for the past 5 days  HPI: [name] is an 46 y.o. female.   Obese Caucasian lady with hypertension and diabetes, no previous known intestinal problems took a course of antibiotics 2 weeks ago for sinusitis, and for the past 5 days has been having colicky abdominal pain frequent loose stools about 10 stools per day. No nausea but sometimes vomiting is induced by severe abdominal pain.Marland Kitchen stool are loose but not watery and contained no blood or pus.  He has no history of sick contacts; her father had a history of intestinal tumors, but she is unclear of the precise nature.  Initially patient came to the emergency room for evaluation and was found to have pancolitis on CT and the hospitalist service was called to assist with management.  Rewiew of Systems:  The patient denies , fever, weight loss,, vision loss, decreased hearing, hoarseness, chest pain, syncope, dyspnea on exertion, peripheral edema, balance deficits, hemoptysis, melena, hematochezia, severe indigestion/heartburn, hematuria, incontinence, genital sores, muscle weakness, suspicious skin lesions, transient blindness, difficulty walking, depression, unusual weight change, abnormal bleeding, enlarged lymph nodes, angioedema, and breast masses.    Past Medical History  Diagnosis Date  . Diabetes mellitus   . Hypertension   . High cholesterol     Past Surgical History  Procedure Date  . Splenectomy   . Tonsillectomy   . Abdominal hysterectomy     Medications:  HOME MEDS: Prior to Admission medications   Medication Sig Start Date End Date Taking? Authorizing Provider  bismuth subsalicylate (PEPTO BISMOL) 262 MG/15ML suspension Take 15 mLs by mouth every 6 (six) hours as needed.   Yes Historical Provider, MD  cyclobenzaprine (FLEXERIL) 10 MG tablet Take 10 mg by mouth 3 (three) times daily as  needed.   Yes Historical Provider, MD  FLUoxetine (PROZAC) 20 MG capsule Take 20 mg by mouth daily.   Yes Historical Provider, MD  glipiZIDE (GLUCOTROL) 10 MG tablet Take 10 mg by mouth 2 (two) times daily before a meal.   Yes Historical Provider, MD  lisinopril (PRINIVIL,ZESTRIL) 20 MG tablet Take 20 mg by mouth daily.   Yes Historical Provider, MD  metFORMIN (GLUCOPHAGE) 1000 MG tablet Take 1,000 mg by mouth 2 (two) times daily.   Yes Historical Provider, MD  pantoprazole (PROTONIX) 40 MG tablet Take 40 mg by mouth daily.   Yes Historical Provider, MD  pravastatin (PRAVACHOL) 20 MG tablet Take 20 mg by mouth daily.   Yes Historical Provider, MD  saxagliptin HCl (ONGLYZA) 5 MG TABS tablet Take 5 mg by mouth daily.   Yes Historical Provider, MD     Allergies:  Allergies  Allergen Reactions  . Aspartame And Phenylalanine Shortness Of Breath and Swelling    Throat swells    Social History:   reports that she has never smoked. She does not have any smokeless tobacco history on file. She reports that she does not drink alcohol or use illicit drugs.  Family History: No family history on file.   Physical Exam: Filed Vitals:   08/05/11 1753 08/05/11 2206 08/06/11 0037  BP: 140/104 136/71 100/68  Pulse: 100 79 77  Temp: 98.5 F (36.9 C)  98.1 F (36.7 C)  TempSrc: Oral Oral Oral  Resp: 20 20 16   Height: 5\' 2"  (1.575 m)    Weight: 80.74 kg (178 lb)    SpO2: 98% 97% 97%  Blood pressure 100/68, pulse 77, temperature 98.1 F (36.7 C), temperature source Oral, resp. rate 16, height 5\' 2"  (1.575 m), weight 80.74 kg (178 lb), SpO2 97.00%.  GEN:  Pleasant but uncomfortable milligram daily lady lying in the stretcher.; cooperative with exam. Her discomfort is increased by the itching and redness of her face, due to allergy to the dye/sweetener in the oral CT contrast. PSYCH:  alert and oriented x4;  affect is appropriate. HEENT: Mucous membranes pink, dry and anicteric; PERRLA; EOM  intact; no cervical lymphadenopathy nor  carotid bruit; no JVD; Breasts:: Not examined CHEST WALL: No tenderness CHEST: Normal respiration, clear to auscultation bilaterally HEART: Regular rate and rhythm; no murmurs rubs or gallops BACK: No kyphosis or scoliosis; no CVA tenderness ABDOMEN: Obese, soft ; no masses, no organomegaly, increase abdominal bowel sounds;  no intertriginous candida. Rectal Exam: Not done EXTREMITIES: ; age-appropriate arthropathy of the hands and knees; no edema; no ulcerations. Genitalia: not examined PULSES: 2+ and symmetric SKIN: Normal hydration no rash or ulceration CNS: Cranial nerves 2-12 grossly intact no focal lateralizing neurologic deficit   Labs & Imaging Results for orders placed during the hospital encounter of 08/05/11 (from the past 48 hour(s))  CBC     Status: Abnormal   Collection Time   08/05/11 10:06 PM      Component Value Range Comment   WBC 23.4 (*) 4.0 - 10.5 (K/uL)    RBC 4.66  3.87 - 5.11 (MIL/uL)    Hemoglobin 15.4 (*) 12.0 - 15.0 (g/dL)    HCT 91.4  78.2 - 95.6 (%)    MCV 95.5  78.0 - 100.0 (fL)    MCH 33.0  26.0 - 34.0 (pg)    MCHC 34.6  30.0 - 36.0 (g/dL)    RDW 21.3  08.6 - 57.8 (%)    Platelets 468 (*) 150 - 400 (K/uL)   DIFFERENTIAL     Status: Abnormal   Collection Time   08/05/11 10:06 PM      Component Value Range Comment   Neutrophils Relative 70  43 - 77 (%)    Neutro Abs 16.2 (*) 1.7 - 7.7 (K/uL)    Lymphocytes Relative 17  12 - 46 (%)    Lymphs Abs 3.9  0.7 - 4.0 (K/uL)    Monocytes Relative 13 (*) 3 - 12 (%)    Monocytes Absolute 3.1 (*) 0.1 - 1.0 (K/uL)    Eosinophils Relative 0  0 - 5 (%)    Eosinophils Absolute 0.1  0.0 - 0.7 (K/uL)    Basophils Relative 0  0 - 1 (%)    Basophils Absolute 0.1  0.0 - 0.1 (K/uL)   COMPREHENSIVE METABOLIC PANEL     Status: Abnormal   Collection Time   08/05/11 10:06 PM      Component Value Range Comment   Sodium 134 (*) 135 - 145 (mEq/L)    Potassium 3.4 (*) 3.5 - 5.1  (mEq/L)    Chloride 96  96 - 112 (mEq/L)    CO2 23  19 - 32 (mEq/L)    Glucose, Bld 228 (*) 70 - 99 (mg/dL)    BUN 8  6 - 23 (mg/dL)    Creatinine, Ser 4.69  0.50 - 1.10 (mg/dL)    Calcium 8.9  8.4 - 10.5 (mg/dL)    Total Protein 7.3  6.0 - 8.3 (g/dL)    Albumin 3.3 (*) 3.5 - 5.2 (g/dL)    AST 33  0 - 37 (U/L)  ALT 57 (*) 0 - 35 (U/L)    Alkaline Phosphatase 71  39 - 117 (U/L)    Total Bilirubin 0.4  0.3 - 1.2 (mg/dL)    GFR calc non Af Amer >90  >90 (mL/min)    GFR calc Af Amer >90  >90 (mL/min)   LIPASE, BLOOD     Status: Normal   Collection Time   08/05/11 10:06 PM      Component Value Range Comment   Lipase 12  11 - 59 (U/L)    Ct Abdomen Pelvis W Contrast  08/06/2011  *RADIOLOGY REPORT*  Clinical Data: Abdominal pain cramping, nausea and vomiting. History splenectomy hysterectomy.  CT ABDOMEN AND PELVIS WITH CONTRAST  Technique:  Multidetector CT imaging of the abdomen and pelvis was performed following the standard protocol during bolus administration of intravenous contrast.  Contrast:  100 ml  Comparison: .  CT abdomen pelvis 01/11/2009  Findings:  The lung bases are clear.  There is fatty infiltration of the liver.  No focal hepatic mass. Patient is status post splenectomy.  There is cavernous transformation of the portal vein, a chronic finding, unchanged compared to prior CT examinations.  There is no biliary ductal dilatation.  The pancreas, adrenal glands, and kidneys are within normal limits.  The appendix is normal.  There is circumferential wall thickening of the colon extending from the cecum through the distal sigmoid colon consistent with pancolitis.  There is injection of the vasculature surrounding the colon.  There is slight stranding in the pericolonic mesenteries.  There is no evidence of bowel obstruction.  Small bowel loops appear to have normal wall thickness.  Abdominal aorta is normal in caliber.  Urinary bladder is within normal limits.  Patient is status post  hysterectomy.  There is no free intraperitoneal air or evidence of abscess.  IMPRESSION:  1.  Pancolitis.  The wall of the entire colon is circumferentially thickened and there is prominence of the pericolonic vasculature and slight pericolonic fat stranding.  Findings could be due to infectious colitis or inflammatory bowel disease.  Consider testing to exclude C difficile colitis. 2.  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 3.  Fatty infiltration of the liver.  Original Report Authenticated By: Britta Mccreedy, M.D.      Assessment Present on Admission:  .Pancolitis, possible C. Difficile, severe other infectious colitis, possibly new onset inflammatory bowel disease  .Diabetes type 2, uncontrolled  .Hypertension .High cholesterol   PLAN: Will admit for hydration, keep her on a liquid diet for the time being, give oral Flagyl, while awaiting the results of the C. difficile PCR, and intravenous Cipro. Will discontinue Cipro if C. difficile becomes positive.  Other plans as per orders  Other plans as per orders.   Deasha Clendenin 08/06/2011, 3:30 AM

## 2011-08-06 NOTE — ED Notes (Signed)
Pt c/o sharp abdominal pain. Medicated IV. Pt alert and oriented x 3. Skin warm and dry. Color pink. IV infusing with no edema or redness.

## 2011-08-07 LAB — GLUCOSE, CAPILLARY
Glucose-Capillary: 175 mg/dL — ABNORMAL HIGH (ref 70–99)
Glucose-Capillary: 173 mg/dL — ABNORMAL HIGH (ref 70–99)
Glucose-Capillary: 184 mg/dL — ABNORMAL HIGH (ref 70–99)
Glucose-Capillary: 172 mg/dL — ABNORMAL HIGH (ref 70–99)

## 2011-08-07 LAB — CBC
HCT: 36.2 % (ref 36.0–46.0)
Hemoglobin: 12.4 g/dL (ref 12.0–15.0)
MCH: 33.2 pg (ref 26.0–34.0)
MCHC: 34.3 g/dL (ref 30.0–36.0)
MCV: 96.8 fL (ref 78.0–100.0)
Platelets: 388 10*3/uL (ref 150–400)
RBC: 3.74 MIL/uL — ABNORMAL LOW (ref 3.87–5.11)
RDW: 14.5 % (ref 11.5–15.5)
WBC: 19.2 10*3/uL — ABNORMAL HIGH (ref 4.0–10.5)

## 2011-08-07 LAB — COMPREHENSIVE METABOLIC PANEL
ALT: 32 U/L (ref 0–35)
AST: 18 U/L (ref 0–37)
Albumin: 2.5 g/dL — ABNORMAL LOW (ref 3.5–5.2)
Alkaline Phosphatase: 47 U/L (ref 39–117)
BUN: 3 mg/dL — ABNORMAL LOW (ref 6–23)
CO2: 25 mEq/L (ref 19–32)
Calcium: 7.7 mg/dL — ABNORMAL LOW (ref 8.4–10.5)
Chloride: 99 mEq/L (ref 96–112)
Creatinine, Ser: 0.57 mg/dL (ref 0.50–1.10)
GFR calc Af Amer: >90 mL/min (ref >90)
GFR calc non Af Amer: >90 mL/min (ref >90)
Glucose, Bld: 252 mg/dL — ABNORMAL HIGH (ref 70–99)
Potassium: 3.4 mEq/L — ABNORMAL LOW (ref 3.5–5.1)
Sodium: 131 mEq/L — ABNORMAL LOW (ref 135–145)
Total Bilirubin: 0.5 mg/dL (ref 0.3–1.2)
Total Protein: 5.6 g/dL — ABNORMAL LOW (ref 6.0–8.3)

## 2011-08-07 MED ORDER — SODIUM CHLORIDE 0.9 % IJ SOLN
INTRAMUSCULAR | Status: AC
  Administered 2011-08-07: 10 mL
  Filled 2011-08-07: qty 3

## 2011-08-07 MED ORDER — MAGNESIUM SULFATE 40 MG/ML IJ SOLN
2.0000 g | Freq: Once | INTRAMUSCULAR | Status: AC
  Administered 2011-08-07: 2 g via INTRAVENOUS
  Filled 2011-08-07: qty 50

## 2011-08-07 MED ORDER — POTASSIUM CHLORIDE 20 MEQ/15ML (10%) PO LIQD
40.0000 meq | Freq: Once | ORAL | Status: AC
  Administered 2011-08-07: 40 meq via ORAL
  Filled 2011-08-07: qty 30

## 2011-08-07 NOTE — Progress Notes (Signed)
[name] is a 46 y.o. female admitted with n/v/d/abdominal pain 2 weeks after taking abx for a sore throat. Stool was positive for c.diff. She reports her stool is still loose but less frequent. She still has abdominal pain.    1. Pancolitis   2. Hypertension   3. High cholesterol     Past Medical History  Diagnosis Date  . Diabetes mellitus   . Hypertension   . High cholesterol    Current Facility-Administered Medications  Medication Dose Route Frequency Provider Last Rate Last Dose  . 0.9 %  sodium chloride infusion   Intravenous STAT Vida Roller, MD      . acetaminophen (TYLENOL) tablet 650 mg  650 mg Oral Q6H PRN Vania Rea, MD       Or  . acetaminophen (TYLENOL) suppository 650 mg  650 mg Rectal Q6H PRN Vania Rea, MD      . cyclobenzaprine (FLEXERIL) tablet 10 mg  10 mg Oral TID PRN Vania Rea, MD      . diphenhydrAMINE (BENADRYL) capsule 50 mg  50 mg Oral Q4H PRN Vania Rea, MD      . diphenhydrAMINE (BENADRYL) injection 25 mg  25 mg Intravenous Q6H PRN Vania Rea, MD      . enoxaparin (LOVENOX) injection 40 mg  40 mg Subcutaneous Q24H Vania Rea, MD   40 mg at 08/07/11 1610  . famotidine (PEPCID) IVPB 20 mg  20 mg Intravenous Q12H Vania Rea, MD   20 mg at 08/06/11 2144  . Flora-Q (FLORA-Q) Capsule 1 capsule  1 capsule Oral Daily Vania Rea, MD   1 capsule at 08/06/11 1024  . FLUoxetine (PROZAC) capsule 20 mg  20 mg Oral Daily Vania Rea, MD   20 mg at 08/06/11 1330  . HYDROmorphone (DILAUDID) injection 0.5 mg  0.5 mg Intravenous Q2H PRN Vania Rea, MD   0.5 mg at 08/07/11 0847  . HYDROmorphone (DILAUDID) injection 1 mg  1 mg Intravenous Q4H PRN Vida Roller, MD   1 mg at 08/06/11 1309  . insulin aspart (novoLOG) injection 0-15 Units  0-15 Units Subcutaneous TID WC Vania Rea, MD   3 Units at 08/07/11 0848  . insulin aspart (novoLOG) injection 0-5 Units  0-5 Units Subcutaneous QHS Vania Rea, MD       . magnesium sulfate IVPB 2 g 50 mL  2 g Intravenous Once Brayln Duque, MD      . metroNIDAZOLE (FLAGYL) tablet 500 mg  500 mg Oral Q8H Vania Rea, MD   500 mg at 08/07/11 9604  . ondansetron (ZOFRAN) injection 4 mg  4 mg Intravenous Q8H PRN Vida Roller, MD      . ondansetron [facility]) tablet 4 mg  4 mg Oral Q6H PRN Vania Rea, MD       Or  . ondansetron [facility]) injection 4 mg  4 mg Intravenous Q4H PRN Vania Rea, MD      . potassium chloride 20 MEQ/15ML (10%) liquid 40 mEq  40 mEq Oral Once Oiva Dibari, MD      . sodium chloride 0.9 % 1,000 mL with potassium chloride 40 mEq infusion   Intravenous Continuous Erick Blinks, MD 75 mL/hr at 08/06/11 1733    . sodium chloride 0.9 % injection        10 mL at 08/07/11 0909  . traZODone (DESYREL) tablet 25 mg  25 mg Oral QHS PRN Vania Rea, MD      . DISCONTD: ciprofloxacin (CIPRO) IVPB 400  mg  400 mg Intravenous Q12H Vania Rea, MD   400 mg at 08/07/11 0151  . DISCONTD: simvastatin (ZOCOR) tablet 10 mg  10 mg Oral q1800 Vania Rea, MD   10 mg at 08/06/11 1732   Allergies  Allergen Reactions  . Aspartame And Phenylalanine Shortness Of Breath and Swelling    Throat swells   Active Problems:  Hypertension  High cholesterol  Diabetes type 2, uncontrolled  Pancolitis   Vital signs in last 24 hours: Temp:  [97.9 F (36.6 C)-98.6 F (37 C)] 97.9 F (36.6 C) (04/19 0847) Pulse Rate:  [90-98] 90  (04/19 0847) Resp:  [20] 20  (04/19 0847) BP: (101-108)/(65-72) 104/65 mmHg (04/19 0847) SpO2:  [92 %-95 %] 93 % (04/19 0847) Weight:  [88.7 kg (195 lb 8.8 oz)] 88.7 kg (195 lb 8.8 oz) (04/19 0701) Weight change: 7.96 kg (17 lb 8.8 oz) Last BM Date: 08/06/11  Intake/Output from previous day: 04/18 0701 - 04/19 0700 In: 2437.1 [P.O.:960; I.V.:1277.1; IV Piggyback:200] Out: 6 [Urine:3; Stool:3] Intake/Output this shift: Total I/O In: 120 [P.O.:120] Out: -   Lab Results:  Basename 08/07/11 0031  08/05/11 2206  WBC 19.2* 23.4*  HGB 12.4 15.4*  HCT 36.2 44.5  PLT 388 468*   BMET  Basename 08/07/11 0031 08/05/11 2206  NA 131* 134*  K 3.4* 3.4*  CL 99 96  CO2 25 23  GLUCOSE 252* 228*  BUN 3* 8  CREATININE 0.57 0.61  CALCIUM 7.7* 8.9    Studies/Results: Ct Abdomen Pelvis W Contrast  08/06/2011  *RADIOLOGY REPORT*  Clinical Data: Abdominal pain cramping, nausea and vomiting. History splenectomy hysterectomy.  CT ABDOMEN AND PELVIS WITH CONTRAST  Technique:  Multidetector CT imaging of the abdomen and pelvis was performed following the standard protocol during bolus administration of intravenous contrast.  Contrast:  100 ml  Comparison: .  CT abdomen pelvis 01/11/2009  Findings:  The lung bases are clear.  There is fatty infiltration of the liver.  No focal hepatic mass. Patient is status post splenectomy.  There is cavernous transformation of the portal vein, a chronic finding, unchanged compared to prior CT examinations.  There is no biliary ductal dilatation.  The pancreas, adrenal glands, and kidneys are within normal limits.  The appendix is normal.  There is circumferential wall thickening of the colon extending from the cecum through the distal sigmoid colon consistent with pancolitis.  There is injection of the vasculature surrounding the colon.  There is slight stranding in the pericolonic mesenteries.  There is no evidence of bowel obstruction.  Small bowel loops appear to have normal wall thickness.  Abdominal aorta is normal in caliber.  Urinary bladder is within normal limits.  Patient is status post hysterectomy.  There is no free intraperitoneal air or evidence of abscess.  IMPRESSION:  1.  Pancolitis.  The wall of the entire colon is circumferentially thickened and there is prominence of the pericolonic vasculature and slight pericolonic fat stranding.  Findings could be due to infectious colitis or inflammatory bowel disease.  Consider testing to exclude C difficile colitis.  2.  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 3.  Fatty infiltration of the liver.  Original Report Authenticated By: Britta Mccreedy, M.D.    Medications: I have reviewed the patient's current medications.   Physical exam GENERAL- alert HEAD- normal atraumatic, no neck masses, normal thyroid, no jvd RESPIRATORY- appears well, vitals normal, no respiratory distress, acyanotic, normal RR, ear and throat exam is normal,  neck free of mass or lymphadenopathy, chest clear, no wheezing, crepitations, rhonchi, normal symmetric air entry CVS- regular rate and rhythm, S1, S2 normal, no murmur, click, rub or gallop ABDOMEN- abdomen is soft without significant tenderness, masses, organomegaly or guarding NEURO- Grossly normal EXTREMITIES- extremities normal, atraumatic, no cyanosis or edema  Plan   *C.difficile colitis- leukocytosis improving. D/c cipro.     To complete 2 weeks of flagyl. Advance diet.   Replenish   electrolytes(potasssium/magnesium).  *Hypertension- controlled.  *High cholesterol- hold statin till diarrhea better.  *Diabetes type 2, uncontrolled- controlled.  *Dvt/gi prophylaxis.  Shaunie Boehm 08/07/2011 9:54 AM Pager: [phone].

## 2011-08-08 LAB — COMPREHENSIVE METABOLIC PANEL
ALT: 27 U/L (ref 0–35)
AST: 24 U/L (ref 0–37)
Albumin: 2.5 g/dL — ABNORMAL LOW (ref 3.5–5.2)
Alkaline Phosphatase: 52 U/L (ref 39–117)
BUN: <3 mg/dL — ABNORMAL LOW (ref 6–23)
CO2: 27 mEq/L (ref 19–32)
Calcium: 8.1 mg/dL — ABNORMAL LOW (ref 8.4–10.5)
Chloride: 105 mEq/L (ref 96–112)
Creatinine, Ser: 0.51 mg/dL (ref 0.50–1.10)
GFR calc Af Amer: >90 mL/min (ref >90)
GFR calc non Af Amer: >90 mL/min (ref >90)
Glucose, Bld: 196 mg/dL — ABNORMAL HIGH (ref 70–99)
Potassium: 4.1 mEq/L (ref 3.5–5.1)
Sodium: 137 mEq/L (ref 135–145)
Total Bilirubin: 0.3 mg/dL (ref 0.3–1.2)
Total Protein: 5.5 g/dL — ABNORMAL LOW (ref 6.0–8.3)

## 2011-08-08 LAB — CBC
HCT: 35.9 % — ABNORMAL LOW (ref 36.0–46.0)
Hemoglobin: 12.3 g/dL (ref 12.0–15.0)
MCH: 33.2 pg (ref 26.0–34.0)
MCHC: 34.3 g/dL (ref 30.0–36.0)
MCV: 97 fL (ref 78.0–100.0)
Platelets: 429 10*3/uL — ABNORMAL HIGH (ref 150–400)
RBC: 3.7 MIL/uL — ABNORMAL LOW (ref 3.87–5.11)
RDW: 14.6 % (ref 11.5–15.5)
WBC: 14.1 10*3/uL — ABNORMAL HIGH (ref 4.0–10.5)

## 2011-08-08 LAB — GLUCOSE, CAPILLARY
Glucose-Capillary: 186 mg/dL — ABNORMAL HIGH (ref 70–99)
Glucose-Capillary: 178 mg/dL — ABNORMAL HIGH (ref 70–99)
Glucose-Capillary: 147 mg/dL — ABNORMAL HIGH (ref 70–99)
Glucose-Capillary: 140 mg/dL — ABNORMAL HIGH (ref 70–99)

## 2011-08-08 LAB — PHOSPHORUS: Phosphorus: 2.9 mg/dL (ref 2.3–4.6)

## 2011-08-08 LAB — MAGNESIUM: Magnesium: 2.1 mg/dL (ref 1.5–2.5)

## 2011-08-08 NOTE — Progress Notes (Signed)
Subjective: Had worsening pain after eating solid food this am.  Still having significant abd pain.  Had 5 BM yesterday, none today, no vomiting.  Objective: Vital signs in last 24 hours: Temp:  [97.3 F (36.3 C)-98.4 F (36.9 C)] 98.2 F (36.8 C) (04/20 0441) Pulse Rate:  [69-91] 69  (04/20 0441) Resp:  [18-20] 18  (04/20 0441) BP: (88-109)/(56-72) 102/70 mmHg (04/20 0441) SpO2:  [93 %-97 %] 97 % (04/20 0441) Weight:  [88.6 kg (195 lb 5.2 oz)] 88.6 kg (195 lb 5.2 oz) (04/20 0441) Weight change:  Last BM Date: 08/07/11  Intake/Output from previous day: 04/19 0701 - 04/20 0700 In: 270 [P.O.:120; IV Piggyback:150] Out: -      Physical Exam: General: Alert, awake, oriented x3, in no acute distress. HEENT: No bruits, no goiter. Heart: Regular rate and rhythm, without murmurs, rubs, gallops. Lungs: Clear to auscultation bilaterally. Abdomen: Soft, nontender, nondistended, positive bowel sounds. Extremities: No clubbing cyanosis or edema with positive pedal pulses. Neuro: Grossly intact, nonfocal.    Lab Results: Basic Metabolic Panel:  Basename 08/08/11 0651 08/07/11 0031 08/06/11 0623  NA 137 131* --  K 4.1 3.4* --  CL 105 99 --  CO2 27 25 --  GLUCOSE 196* 252* --  BUN <3* 3* --  CREATININE 0.51 0.57 --  CALCIUM 8.1* 7.7* --  MG 2.1 -- 1.6  PHOS 2.9 -- --   Liver Function Tests:  [facility] 08/08/11 0651 08/07/11 0031  AST 24 18  ALT 27 32  ALKPHOS 52 47  BILITOT 0.3 0.5  PROT 5.5* 5.6*  ALBUMIN 2.5* 2.5*    Basename 08/05/11 2206  LIPASE 12  AMYLASE --   No results found for this basename: AMMONIA:2 in the last 72 hours CBC:  Basename 08/08/11 0651 08/07/11 0031 08/05/11 2206  WBC 14.1* 19.2* --  NEUTROABS -- -- 16.2*  HGB 12.3 12.4 --  HCT 35.9* 36.2 --  MCV 97.0 96.8 --  PLT 429* 388 --   Cardiac Enzymes: No results found for this basename: CKTOTAL:3,CKMB:3,CKMBINDEX:3,TROPONINI:3 in the last 72 hours BNP: No results found for this basename:  PROBNP:3 in the last 72 hours D-Dimer: No results found for this basename: DDIMER:2 in the last 72 hours CBG:  Basename 08/08/11 1158 08/08/11 0819 08/07/11 2048 08/07/11 1710 08/07/11 1117 08/07/11 0736  GLUCAP 178* 186* 172* 184* 173* 175*   Hemoglobin A1C:  Basename 08/06/11 0320  HGBA1C 8.9*   Fasting Lipid Panel: No results found for this basename: CHOL,HDL,LDLCALC,TRIG,CHOLHDL,LDLDIRECT in the last 72 hours Thyroid Function Tests: No results found for this basename: TSH,T4TOTAL,FREET4,T3FREE,THYROIDAB in the last 72 hours Anemia Panel: No results found for this basename: VITAMINB12,FOLATE,FERRITIN,TIBC,IRON,RETICCTPCT in the last 72 hours Coagulation: No results found for this basename: LABPROT:2,INR:2 in the last 72 hours Urine Drug Screen: Drugs of Abuse  No results found for this basename: labopia, cocainscrnur, labbenz, amphetmu, thcu, labbarb    Alcohol Level: No results found for this basename: ETH:2 in the last 72 hours Urinalysis: No results found for this basename: COLORURINE:2,APPERANCEUR:2,LABSPEC:2,PHURINE:2,GLUCOSEU:2,HGBUR:2,BILIRUBINUR:2,KETONESUR:2,PROTEINUR:2,UROBILINOGEN:2,NITRITE:2,LEUKOCYTESUR:2 in the last 72 hours  Recent Results (from the past 240 hour(s))  CLOSTRIDIUM DIFFICILE BY PCR     Status: Abnormal   Collection Time   08/06/11  3:30 PM      Component Value Range Status Comment   C difficile by pcr POSITIVE (*) NEGATIVE  Final     Studies/Results: No results found.  Medications: Scheduled Meds:   . enoxaparin  40 mg Subcutaneous Q24H  . famotidine (PEPCID)  IV  20 mg Intravenous Q12H  . Flora-Q  1 capsule Oral Daily  . FLUoxetine  20 mg Oral Daily  . insulin aspart  0-15 Units Subcutaneous TID WC  . insulin aspart  0-5 Units Subcutaneous QHS  . metroNIDAZOLE  500 mg Oral Q8H   Continuous Infusions:   . sodium chloride 0.9 % 1,000 mL with potassium chloride 40 mEq infusion 75 mL/hr at 08/07/11 2027   PRN Meds:.acetaminophen,  acetaminophen, cyclobenzaprine, diphenhydrAMINE, diphenhydrAMINE, HYDROmorphone, ondansetron (ZOFRAN) IV, ondansetron, traZODone  Assessment/Plan:  Active Problems:  Hypertension  High cholesterol  Diabetes type 2, uncontrolled  Pancolitis  Plan:  1. C diff colitis.  Leukocytosis is improving as is diarrhea.  She continues to have significant abd pain.  We will go back to full liquids for now.  Continue flagyl and advance diet as tolerated.  2. HTN, stable  3. DM2,  Fair control  Dispo, plan on discharge home once abd pain has improved and tolerating po.  Hopefully in next 24-48hrs   LOS: 3 days   Yena Tisby [facility] Pager: [phone] 08/08/2011, 12:36 PM

## 2011-08-09 ENCOUNTER — Inpatient Hospital Stay (HOSPITAL_COMMUNITY): Payer: BC Managed Care – PPO

## 2011-08-09 DIAGNOSIS — A0472 Enterocolitis due to Clostridium difficile, not specified as recurrent: Secondary | ICD-10-CM | POA: Diagnosis present

## 2011-08-09 LAB — GLUCOSE, CAPILLARY
Glucose-Capillary: 147 mg/dL — ABNORMAL HIGH (ref 70–99)
Glucose-Capillary: 174 mg/dL — ABNORMAL HIGH (ref 70–99)
Glucose-Capillary: 131 mg/dL — ABNORMAL HIGH (ref 70–99)
Glucose-Capillary: 179 mg/dL — ABNORMAL HIGH (ref 70–99)

## 2011-08-09 LAB — CBC
HCT: 36.6 % (ref 36.0–46.0)
Hemoglobin: 12 g/dL (ref 12.0–15.0)
MCH: 32.2 pg (ref 26.0–34.0)
MCHC: 32.8 g/dL (ref 30.0–36.0)
MCV: 98.1 fL (ref 78.0–100.0)
Platelets: 471 10*3/uL — ABNORMAL HIGH (ref 150–400)
RBC: 3.73 MIL/uL — ABNORMAL LOW (ref 3.87–5.11)
RDW: 14.8 % (ref 11.5–15.5)
WBC: 13.8 10*3/uL — ABNORMAL HIGH (ref 4.0–10.5)

## 2011-08-09 LAB — BASIC METABOLIC PANEL
BUN: <3 mg/dL — ABNORMAL LOW (ref 6–23)
CO2: 26 mEq/L (ref 19–32)
Calcium: 8.3 mg/dL — ABNORMAL LOW (ref 8.4–10.5)
Chloride: 100 mEq/L (ref 96–112)
Creatinine, Ser: 0.51 mg/dL (ref 0.50–1.10)
GFR calc Af Amer: >90 mL/min (ref >90)
GFR calc non Af Amer: >90 mL/min (ref >90)
Glucose, Bld: 228 mg/dL — ABNORMAL HIGH (ref 70–99)
Potassium: 3.9 mEq/L (ref 3.5–5.1)
Sodium: 132 mEq/L — ABNORMAL LOW (ref 135–145)

## 2011-08-09 MED ORDER — SODIUM CHLORIDE 0.9 % IJ SOLN
INTRAMUSCULAR | Status: AC
  Administered 2011-08-09: 17:00:00
  Filled 2011-08-09: qty 3

## 2011-08-09 MED ORDER — PANTOPRAZOLE SODIUM 40 MG IV SOLR
40.0000 mg | INTRAVENOUS | Status: DC
  Administered 2011-08-09: 40 mg via INTRAVENOUS
  Filled 2011-08-09: qty 40

## 2011-08-09 MED ORDER — VANCOMYCIN 50 MG/ML ORAL SOLUTION
125.0000 mg | Freq: Four times a day (QID) | ORAL | Status: DC
  Administered 2011-08-09 – 2011-08-10 (×4): 125 mg via ORAL
  Filled 2011-08-09 (×5): qty 2.5

## 2011-08-09 MED ORDER — OXYCODONE-ACETAMINOPHEN 5-325 MG PO TABS
1.0000 | ORAL_TABLET | ORAL | Status: DC | PRN
  Administered 2011-08-09 – 2011-08-11 (×5): 2 via ORAL
  Administered 2011-08-11: 1 via ORAL
  Administered 2011-08-11: 2 via ORAL
  Administered 2011-08-12: 1 via ORAL
  Filled 2011-08-09 (×2): qty 2
  Filled 2011-08-09: qty 1
  Filled 2011-08-09 (×4): qty 2
  Filled 2011-08-09: qty 1

## 2011-08-09 NOTE — Progress Notes (Signed)
Subjective: She continues to have diffuse generalized abdominal pain with any sort of by mouth intake. She's not had any vomiting. Reports that she is still having diarrhea.  Objective: Vital signs in last 24 hours: Temp:  [97.8 F (36.6 C)-98.2 F (36.8 C)] 98.2 F (36.8 C) (04/21 1027) Pulse Rate:  [71-87] 82  (04/21 1027) Resp:  [16-20] 20  (04/21 1027) BP: (95-115)/(64-79) 95/65 mmHg (04/21 1027) SpO2:  [94 %-97 %] 95 % (04/21 1027) Weight change:  Last BM Date: 08/08/11  Intake/Output from previous day: 04/20 0701 - 04/21 0700 In: 1020 [P.O.:1020] Out: -  Total I/O In: 120 [P.O.:120] Out: -    Physical Exam: General: Alert, awake, oriented x3, in no acute distress. HEENT: No bruits, no goiter. Heart: Regular rate and rhythm, without murmurs, rubs, gallops. Lungs: Clear to auscultation bilaterally. Abdomen: Soft, diffuse generalized tenderness, nondistended, positive bowel sounds. Extremities: No clubbing cyanosis or edema with positive pedal pulses. Neuro: Grossly intact, nonfocal.    Lab Results: Basic Metabolic Panel:  Basename 08/09/11 1230 08/08/11 0651  NA 132* 137  K 3.9 4.1  CL 100 105  CO2 26 27  GLUCOSE 228* 196*  BUN <3* <3*  CREATININE 0.51 0.51  CALCIUM 8.3* 8.1*  MG -- 2.1  PHOS -- 2.9   Liver Function Tests:  Basename 08/08/11 0651 08/07/11 0031  AST 24 18  ALT 27 32  ALKPHOS 52 47  BILITOT 0.3 0.5  PROT 5.5* 5.6*  ALBUMIN 2.5* 2.5*   No results found for this basename: LIPASE:2,AMYLASE:2 in the last 72 hours No results found for this basename: AMMONIA:2 in the last 72 hours CBC:  Basename 08/09/11 1230 08/08/11 0651  WBC 13.8* 14.1*  NEUTROABS -- --  HGB 12.0 12.3  HCT 36.6 35.9*  MCV 98.1 97.0  PLT 471* 429*   Cardiac Enzymes: No results found for this basename: CKTOTAL:3,CKMB:3,CKMBINDEX:3,TROPONINI:3 in the last 72 hours BNP: No results found for this basename: PROBNP:3 in the last 72 hours D-Dimer: No results found  for this basename: DDIMER:2 in the last 72 hours CBG:  Basename 08/09/11 1142 08/09/11 0749 08/08/11 2028 08/08/11 1634 08/08/11 1158 08/08/11 0819  GLUCAP 174* 147* 140* 147* 178* 186*   Hemoglobin A1C: No results found for this basename: HGBA1C in the last 72 hours Fasting Lipid Panel: No results found for this basename: CHOL,HDL,LDLCALC,TRIG,CHOLHDL,LDLDIRECT in the last 72 hours Thyroid Function Tests: No results found for this basename: TSH,T4TOTAL,FREET4,T3FREE,THYROIDAB in the last 72 hours Anemia Panel: No results found for this basename: VITAMINB12,FOLATE,FERRITIN,TIBC,IRON,RETICCTPCT in the last 72 hours Coagulation: No results found for this basename: LABPROT:2,INR:2 in the last 72 hours Urine Drug Screen: Drugs of Abuse  No results found for this basename: labopia, cocainscrnur, labbenz, amphetmu, thcu, labbarb    Alcohol Level: No results found for this basename: ETH:2 in the last 72 hours Urinalysis: No results found for this basename: COLORURINE:2,APPERANCEUR:2,LABSPEC:2,PHURINE:2,GLUCOSEU:2,HGBUR:2,BILIRUBINUR:2,KETONESUR:2,PROTEINUR:2,UROBILINOGEN:2,NITRITE:2,LEUKOCYTESUR:2 in the last 72 hours  Recent Results (from the past 240 hour(s))  CLOSTRIDIUM DIFFICILE BY PCR     Status: Abnormal   Collection Time   08/06/11  3:30 PM      Component Value Range Status Comment   C difficile by pcr POSITIVE (*) NEGATIVE  Final     Studies/Results: No results found.  Medications: Scheduled Meds:   . enoxaparin  40 mg Subcutaneous Q24H  . famotidine (PEPCID) IV  20 mg Intravenous Q12H  . Flora-Q  1 capsule Oral Daily  . FLUoxetine  20 mg Oral Daily  .  insulin aspart  0-15 Units Subcutaneous TID WC  . insulin aspart  0-5 Units Subcutaneous QHS  . metroNIDAZOLE  500 mg Oral Q8H   Continuous Infusions:   . sodium chloride 0.9 % 1,000 mL with potassium chloride 40 mEq infusion 75 mL/hr at 08/08/11 2157   PRN Meds:.acetaminophen, acetaminophen, cyclobenzaprine,  diphenhydrAMINE, diphenhydrAMINE, HYDROmorphone, ondansetron (ZOFRAN) IV, ondansetron, traZODone  Assessment/Plan:  Active Problems:  Hypertension  High cholesterol  Diabetes type 2, uncontrolled  Pancolitis  Plan:  #1. Clostridium difficile colitis. Patient was found to have pancolitis on CT on admission. She's been started on oral Flagyl. She has had some improvement in her leukocytosis, she has not had a further fevers. She is on day 4 of therapy he continues to have significant abdominal pain requiring frequent pain medicines. She is on a full liquid diet and has failed to progress. We will repeat a CT scan today without contrast to see if there is any new developments in the interim. We will also change her antibiotics from Flagyl to vancomycin.  #2. Diabetes. Paracentral.  3. Hypertension. Stable.  4. Disposition. Plan will be to discharge home once her abdominal pain has improved.   LOS: 4 days   Manson Luckadoo [facility] Pager: [phone] 08/09/2011, 2:29 PM

## 2011-08-10 LAB — GLUCOSE, CAPILLARY
Glucose-Capillary: 150 mg/dL — ABNORMAL HIGH (ref 70–99)
Glucose-Capillary: 229 mg/dL — ABNORMAL HIGH (ref 70–99)
Glucose-Capillary: 208 mg/dL — ABNORMAL HIGH (ref 70–99)
Glucose-Capillary: 96 mg/dL (ref 70–99)
Glucose-Capillary: 220 mg/dL — ABNORMAL HIGH (ref 70–99)

## 2011-08-10 LAB — CBC
HCT: 37.4 % (ref 36.0–46.0)
Hemoglobin: 12.4 g/dL (ref 12.0–15.0)
MCH: 32.4 pg (ref 26.0–34.0)
MCHC: 33.2 g/dL (ref 30.0–36.0)
MCV: 97.7 fL (ref 78.0–100.0)
Platelets: 486 10*3/uL — ABNORMAL HIGH (ref 150–400)
RBC: 3.83 MIL/uL — ABNORMAL LOW (ref 3.87–5.11)
RDW: 14.9 % (ref 11.5–15.5)
WBC: 12.2 10*3/uL — ABNORMAL HIGH (ref 4.0–10.5)

## 2011-08-10 LAB — BASIC METABOLIC PANEL
BUN: <3 mg/dL — ABNORMAL LOW (ref 6–23)
CO2: 27 mEq/L (ref 19–32)
Calcium: 8.8 mg/dL (ref 8.4–10.5)
Chloride: 101 mEq/L (ref 96–112)
Creatinine, Ser: 0.59 mg/dL (ref 0.50–1.10)
GFR calc Af Amer: >90 mL/min (ref >90)
GFR calc non Af Amer: >90 mL/min (ref >90)
Glucose, Bld: 196 mg/dL — ABNORMAL HIGH (ref 70–99)
Potassium: 4.2 mEq/L (ref 3.5–5.1)
Sodium: 135 mEq/L (ref 135–145)

## 2011-08-10 MED ORDER — VANCOMYCIN 50 MG/ML ORAL SOLUTION
250.0000 mg | Freq: Four times a day (QID) | ORAL | Status: DC
  Administered 2011-08-10 – 2011-08-12 (×8): 250 mg via ORAL
  Filled 2011-08-10 (×9): qty 5

## 2011-08-10 MED ORDER — SODIUM CHLORIDE 0.9 % IJ SOLN
INTRAMUSCULAR | Status: AC
  Administered 2011-08-10: 10 mL
  Filled 2011-08-10: qty 3

## 2011-08-10 NOTE — Progress Notes (Signed)
   CARE MANAGEMENT NOTE 08/10/2011  Patient:  [name], [name]   Account Number:  [id number]  Date Initiated:  08/10/2011  Documentation initiated by:  Rosemary Holms  Subjective/Objective Assessment:   Pt admitted with n,v,d. PTA pt lived at home.     Action/Plan:   Spoke to pt at bedside. No HH DME needs identified.   Anticipated DC Date:  08/11/2011   Anticipated DC Plan:  HOME/SELF CARE      DC Planning Services  CM consult      Choice offered to / List presented to:             Status of service:  In process, will continue to follow Medicare Important Message given?   (If response is "NO", the following Medicare IM given date fields will be blank) Date Medicare IM given:   Date Additional Medicare IM given:    Discharge Disposition:    Per UR Regulation:    If discussed at Long Length of Stay Meetings, dates discussed:    Comments:  08/10/11 1300 Moreen Piggott Leanord Hawking RN BSN CM

## 2011-08-10 NOTE — Progress Notes (Signed)
Subjective: This lady was admitted with C. difficile colitis secondary to taking antibiotics for sinusitis for 2 weeks. Her white blood cell count has improved, her CT scan of her abdomen also shows improvement in her pancolitis. However, she continues to have pain although she is able to keep food down without vomiting.           Physical Exam: Blood pressure 133/84, pulse 71, temperature 98.1 F (36.7 C), temperature source Oral, resp. rate 20, height 5\' 2"  (1.575 m), weight 89.6 kg (197 lb 8.5 oz), SpO2 98.00%. She actually looks systemically well. She is not toxic or septic. She appears to be in pain. Her abdomen is soft it is tender in a generalized fashion. Bowel sounds are scanty. Heart sounds are present and normal. Lung fields are clear. She is alert and orientated.   Investigations:  Recent Results (from the past 240 hour(s))  CLOSTRIDIUM DIFFICILE BY PCR     Status: Abnormal   Collection Time   08/06/11  3:30 PM      Component Value Range Status Comment   C difficile by pcr POSITIVE (*) NEGATIVE  Final      Basic Metabolic Panel:  Basename 08/10/11 0523 08/09/11 1230 08/08/11 0651  NA 135 132* --  K 4.2 3.9 --  CL 101 100 --  CO2 27 26 --  GLUCOSE 196* 228* --  BUN <3* <3* --  CREATININE 0.59 0.51 --  CALCIUM 8.8 8.3* --  MG -- -- 2.1  PHOS -- -- 2.9   Liver Function Tests:  [facility] 08/08/11 0651  AST 24  ALT 27  ALKPHOS 52  BILITOT 0.3  PROT 5.5*  ALBUMIN 2.5*     CBC:  Basename 08/10/11 0523 08/09/11 1230  WBC 12.2* 13.8*  NEUTROABS -- --  HGB 12.4 12.0  HCT 37.4 36.6  MCV 97.7 98.1  PLT 486* 471*    Ct Abdomen Pelvis Wo Contrast  08/09/2011  *RADIOLOGY REPORT*  Clinical Data: Persistent abdominal pain.  History of C difficile colitis.  CT ABDOMEN AND PELVIS WITHOUT CONTRAST  Technique:  Multidetector CT imaging of the abdomen and pelvis was performed following the standard protocol without intravenous contrast.  Comparison: 08/05/2011   Findings: Clear lung bases.  Normal heart size.  Trace bilateral pleural effusions are new.  Mild hepatic steatosis.  Hepatomegaly, greater than 21 cm cranial caudal.  Status post splenectomy.  Normal stomach, pancreas, gallbladder, biliary tract, adrenal glands, kidneys.  Prominent retroperitoneal nodes are unchanged. Not pathologic by size criteria.  Cavernous transformation of the portal vein is more apparent on recent contrast enhanced exam.  Improvement in colitis.  There is residual wall thickening involving the proximal transverse and hepatic flexure colon on image 34. Normal terminal ileum and appendix.  No pneumatosis or free intraperitoneal air.  Normal small bowel without abdominal ascites.  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 No ascites.  No pelvic adenopathy.  Normal urinary bladder.  Hysterectomy. Normal ovaries/adnexa for age. No significant free fluid.  Likely degenerative sclerosis of the bilateral sacroiliac joints. No acute osseous abnormality.  IMPRESSION:  1.  Improvement in colitis.  Hepatic flexure and proximal transverse colonic wall thickening remains. 2.  No new explanation for abdominal pain. 3.  Hepatic steatosis and hepatomegaly.  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 4.  New trace bilateral pleural effusions.  Original Report Authenticated By: Consuello Bossier, M.D.      Medications:  Scheduled:   .  enoxaparin  40 mg Subcutaneous Q24H  . Flora-Q  1 capsule Oral Daily  . FLUoxetine  20 mg Oral Daily  . insulin aspart  0-15 Units Subcutaneous TID WC  . insulin aspart  0-5 Units Subcutaneous QHS  . sodium chloride      . sodium chloride      . vancomycin  250 mg Oral Q6H  . DISCONTD: famotidine (PEPCID) IV  20 mg Intravenous Q12H  . DISCONTD: metroNIDAZOLE  500 mg Oral Q8H  . DISCONTD: pantoprazole (PROTONIX) IV  40 mg Intravenous Q24H  . DISCONTD: vancomycin  125 mg Oral Q6H    Impression: 1.  C. difficile colitis, radiologically and hematologically improving but patient does not feel better. 2. Type 2 diabetes mellitus. 3. Hypertension. 4. Obesity.     Plan: 1. Increase vancomycin to 250 mg every 6 hours orally. 2. Analgesia as required.     LOS: 5 days   Wilson Singer Pager [phone]  08/10/2011, 10:51 AM

## 2011-08-11 LAB — CBC
HCT: 37.6 % (ref 36.0–46.0)
Hemoglobin: 12.4 g/dL (ref 12.0–15.0)
MCH: 32 pg (ref 26.0–34.0)
MCHC: 33 g/dL (ref 30.0–36.0)
MCV: 97.2 fL (ref 78.0–100.0)
Platelets: 479 10*3/uL — ABNORMAL HIGH (ref 150–400)
RBC: 3.87 MIL/uL (ref 3.87–5.11)
RDW: 14.9 % (ref 11.5–15.5)
WBC: 12.1 10*3/uL — ABNORMAL HIGH (ref 4.0–10.5)

## 2011-08-11 LAB — GLUCOSE, CAPILLARY
Glucose-Capillary: 148 mg/dL — ABNORMAL HIGH (ref 70–99)
Glucose-Capillary: 241 mg/dL — ABNORMAL HIGH (ref 70–99)
Glucose-Capillary: 204 mg/dL — ABNORMAL HIGH (ref 70–99)
Glucose-Capillary: 239 mg/dL — ABNORMAL HIGH (ref 70–99)

## 2011-08-11 LAB — COMPREHENSIVE METABOLIC PANEL
ALT: 18 U/L (ref 0–35)
AST: 19 U/L (ref 0–37)
Albumin: 2.7 g/dL — ABNORMAL LOW (ref 3.5–5.2)
Alkaline Phosphatase: 53 U/L (ref 39–117)
BUN: <3 mg/dL — ABNORMAL LOW (ref 6–23)
CO2: 30 mEq/L (ref 19–32)
Calcium: 9.2 mg/dL (ref 8.4–10.5)
Chloride: 101 mEq/L (ref 96–112)
Creatinine, Ser: 0.63 mg/dL (ref 0.50–1.10)
GFR calc Af Amer: >90 mL/min (ref >90)
GFR calc non Af Amer: >90 mL/min (ref >90)
Glucose, Bld: 192 mg/dL — ABNORMAL HIGH (ref 70–99)
Potassium: 4.6 mEq/L (ref 3.5–5.1)
Sodium: 137 mEq/L (ref 135–145)
Total Bilirubin: 0.2 mg/dL — ABNORMAL LOW (ref 0.3–1.2)
Total Protein: 6 g/dL (ref 6.0–8.3)

## 2011-08-11 MED ORDER — SODIUM CHLORIDE 0.9 % IV SOLN: 250.0000 mL | INTRAVENOUS | Status: DC | PRN

## 2011-08-11 MED ORDER — SODIUM CHLORIDE 0.9 % IJ SOLN
3.0000 mL | Freq: Two times a day (BID) | INTRAMUSCULAR | Status: DC
  Administered 2011-08-11 (×2): 3 mL via INTRAVENOUS
  Filled 2011-08-11: qty 3

## 2011-08-11 MED ORDER — SODIUM CHLORIDE 0.9 % IJ SOLN: 3.0000 mL | INTRAMUSCULAR | Status: DC | PRN

## 2011-08-11 NOTE — Progress Notes (Signed)
Subjective: This lady is slowly improving. She is able to take food down, still has some diarrhea apparently and abdominal pain.           Physical Exam: Blood pressure 108/75, pulse 75, temperature 97.9 F (36.6 C), temperature source Oral, resp. rate 20, height 5\' 2"  (1.575 m), weight 87.2 kg (192 lb 3.9 oz), SpO2 96.00%. She actually looks systemically well. She is not toxic or septic. She appears to be in pain. Her abdomen is soft it is tender in a generalized fashion. Bowel sounds are scanty. Heart sounds are present and normal. Lung fields are clear. She is alert and orientated.   Investigations:  Recent Results (from the past 240 hour(s))  CLOSTRIDIUM DIFFICILE BY PCR     Status: Abnormal   Collection Time   08/06/11  3:30 PM      Component Value Range Status Comment   C difficile by pcr POSITIVE (*) NEGATIVE  Final      Basic Metabolic Panel:  Basename 08/11/11 0629 08/10/11 0523  NA 137 135  K 4.6 4.2  CL 101 101  CO2 30 27  GLUCOSE 192* 196*  BUN <3* <3*  CREATININE 0.63 0.59  CALCIUM 9.2 8.8  MG -- --  PHOS -- --   Liver Function Tests:  [facility] 08/11/11 0629  AST 19  ALT 18  ALKPHOS 53  BILITOT 0.2*  PROT 6.0  ALBUMIN 2.7*     CBC:  Basename 08/11/11 0629 08/10/11 0523  WBC 12.1* 12.2*  NEUTROABS -- --  HGB 12.4 12.4  HCT 37.6 37.4  MCV 97.2 97.7  PLT 479* 486*    Ct Abdomen Pelvis Wo Contrast  08/09/2011  *RADIOLOGY REPORT*  Clinical Data: Persistent abdominal pain.  History of C difficile colitis.  CT ABDOMEN AND PELVIS WITHOUT CONTRAST  Technique:  Multidetector CT imaging of the abdomen and pelvis was performed following the standard protocol without intravenous contrast.  Comparison: 08/05/2011  Findings: Clear lung bases.  Normal heart size.  Trace bilateral pleural effusions are new.  Mild hepatic steatosis.  Hepatomegaly, greater than 21 cm cranial caudal.  Status post splenectomy.  Normal stomach, pancreas, gallbladder, biliary  tract, adrenal glands, kidneys.  Prominent retroperitoneal nodes are unchanged. Not pathologic by size criteria.  Cavernous transformation of the portal vein is more apparent on recent contrast enhanced exam.  Improvement in colitis.  There is residual wall thickening involving the proximal transverse and hepatic flexure colon on image 34. Normal terminal ileum and appendix.  No pneumatosis or free intraperitoneal air.  Normal small bowel without abdominal ascites.  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 No ascites.  No pelvic adenopathy.  Normal urinary bladder.  Hysterectomy. Normal ovaries/adnexa for age. No significant free fluid.  Likely degenerative sclerosis of the bilateral sacroiliac joints. No acute osseous abnormality.  IMPRESSION:  1.  Improvement in colitis.  Hepatic flexure and proximal transverse colonic wall thickening remains. 2.  No new explanation for abdominal pain. 3.  Hepatic steatosis and hepatomegaly.  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 4.  New trace bilateral pleural effusions.  Original Report Authenticated By: Consuello Bossier, M.D.      Medications:  Scheduled:    . enoxaparin  40 mg Subcutaneous Q24H  . Flora-Q  1 capsule Oral Daily  . FLUoxetine  20 mg Oral Daily  . insulin aspart  0-15 Units Subcutaneous TID WC  . insulin aspart  0-5 Units Subcutaneous QHS  .  vancomycin  250 mg Oral Q6H    Impression: 1. C. difficile colitis, radiologically and hematologically improving , patient feels somewhat better, slowly.. 2. Type 2 diabetes mellitus. 3. Hypertension. 4. Obesity.     Plan: 1. Discontinue IV fluids. 2. Mobilize. 3. Possible discharge home tomorrow.     LOS: 6 days   Wilson Singer Pager [phone]  08/11/2011, 10:55 AM

## 2011-08-11 NOTE — Progress Notes (Signed)
UR Chart Review Completed  

## 2011-08-12 LAB — GLUCOSE, CAPILLARY: Glucose-Capillary: 238 mg/dL — ABNORMAL HIGH (ref 70–99)

## 2011-08-12 MED ORDER — SENNA-DOCUSATE SODIUM 8.6-50 MG PO TABS: 1.0000 | ORAL_TABLET | Freq: Every day | ORAL | Status: DC

## 2011-08-12 MED ORDER — VANCOMYCIN HCL 250 MG PO CAPS: 250.0000 mg | ORAL_CAPSULE | Freq: Four times a day (QID) | ORAL | Status: DC

## 2011-08-12 MED ORDER — OXYCODONE-ACETAMINOPHEN 5-325 MG PO TABS: 1.0000 | ORAL_TABLET | ORAL | Status: DC | PRN

## 2011-08-12 NOTE — Discharge Summary (Signed)
Pt dc'd to home with belongings and education materials.

## 2011-08-12 NOTE — Discharge Summary (Signed)
Physician Discharge Summary  Patient ID: [name] MRN: [id number] DOB/AGE: 24-May-1965 46 y.o.  Admit date: 08/05/2011 Discharge date: 08/12/2011    Discharge Diagnoses:  1. C. difficile pancolitis, slowly improving. 2. Diabetes mellitus. 3. Hypertension. 4. Obesity.   Medication List  As of 08/12/2011 10:27 AM   TAKE these medications         bismuth subsalicylate 262 MG/15ML suspension   Commonly known as: PEPTO BISMOL   Take 15 mLs by mouth every 6 (six) hours as needed.      cyclobenzaprine 10 MG tablet   Commonly known as: FLEXERIL   Take 10 mg by mouth 3 (three) times daily as needed.      FLUoxetine 20 MG capsule   Commonly known as: PROZAC   Take 20 mg by mouth daily.      glipiZIDE 10 MG tablet   Commonly known as: GLUCOTROL   Take 10 mg by mouth 2 (two) times daily before a meal.      lisinopril 20 MG tablet   Commonly known as: PRINIVIL,ZESTRIL   Take 20 mg by mouth daily.      metFORMIN 1000 MG tablet   Commonly known as: GLUCOPHAGE   Take 1,000 mg by mouth 2 (two) times daily.      oxyCODONE-acetaminophen 5-325 MG per tablet   Commonly known as: PERCOCET   Take 1-2 tablets by mouth every 4 (four) hours as needed.      pantoprazole 40 MG tablet   Commonly known as: PROTONIX   Take 40 mg by mouth daily.      pravastatin 20 MG tablet   Commonly known as: PRAVACHOL   Take 20 mg by mouth daily.      saxagliptin HCl 5 MG Tabs tablet   Commonly known as: ONGLYZA   Take 5 mg by mouth daily.      sennosides-docusate sodium 8.6-50 MG tablet   Commonly known as: SENOKOT-S   Take 1 tablet by mouth daily.      vancomycin 250 MG capsule   Commonly known as: VANCOCIN   Take 1 capsule (250 mg total) by mouth 4 (four) times daily.            Discharged Condition: Stable.    Consults: None.  Significant Diagnostic Studies: Ct Abdomen Pelvis Wo Contrast  08/09/2011  *RADIOLOGY REPORT*  Clinical Data: Persistent abdominal pain.  History of C  difficile colitis.  CT ABDOMEN AND PELVIS WITHOUT CONTRAST  Technique:  Multidetector CT imaging of the abdomen and pelvis was performed following the standard protocol without intravenous contrast.  Comparison: 08/05/2011  Findings: Clear lung bases.  Normal heart size.  Trace bilateral pleural effusions are new.  Mild hepatic steatosis.  Hepatomegaly, greater than 21 cm cranial caudal.  Status post splenectomy.  Normal stomach, pancreas, gallbladder, biliary tract, adrenal glands, kidneys.  Prominent retroperitoneal nodes are unchanged. Not pathologic by size criteria.  Cavernous transformation of the portal vein is more apparent on recent contrast enhanced exam.  Improvement in colitis.  There is residual wall thickening involving the proximal transverse and hepatic flexure colon on image 34. Normal terminal ileum and appendix.  No pneumatosis or free intraperitoneal air.  Normal small bowel without abdominal ascites.  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 No ascites.  No pelvic adenopathy.  Normal urinary bladder.  Hysterectomy. Normal ovaries/adnexa for age. No significant free fluid.  Likely degenerative sclerosis of the bilateral sacroiliac joints. No acute osseous  abnormality.  IMPRESSION:  1.  Improvement in colitis.  Hepatic flexure and proximal transverse colonic wall thickening remains. 2.  No new explanation for abdominal pain. 3.  Hepatic steatosis and hepatomegaly.  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 4.  New trace bilateral pleural effusions.  Original Report Authenticated By: Consuello Bossier, M.D.   Ct Abdomen Pelvis W Contrast  08/06/2011  *RADIOLOGY REPORT*  Clinical Data: Abdominal pain cramping, nausea and vomiting. History splenectomy hysterectomy.  CT ABDOMEN AND PELVIS WITH CONTRAST  Technique:  Multidetector CT imaging of the abdomen and pelvis was performed following the standard protocol during bolus  administration of intravenous contrast.  Contrast:  100 ml  Comparison: .  CT abdomen pelvis 01/11/2009  Findings:  The lung bases are clear.  There is fatty infiltration of the liver.  No focal hepatic mass. Patient is status post splenectomy.  There is cavernous transformation of the portal vein, a chronic finding, unchanged compared to prior CT examinations.  There is no biliary ductal dilatation.  The pancreas, adrenal glands, and kidneys are within normal limits.  The appendix is normal.  There is circumferential wall thickening of the colon extending from the cecum through the distal sigmoid colon consistent with pancolitis.  There is injection of the vasculature surrounding the colon.  There is slight stranding in the pericolonic mesenteries.  There is no evidence of bowel obstruction.  Small bowel loops appear to have normal wall thickness.  Abdominal aorta is normal in caliber.  Urinary bladder is within normal limits.  Patient is status post hysterectomy.  There is no free intraperitoneal air or evidence of abscess.  IMPRESSION:  1.  Pancolitis.  The wall of the entire colon is circumferentially thickened and there is prominence of the pericolonic vasculature and slight pericolonic fat stranding.  Findings could be due to infectious colitis or inflammatory bowel disease.  Consider testing to exclude C difficile colitis. 2.  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 3.  Fatty infiltration of the liver.  Original Report Authenticated By: Britta Mccreedy, M.D.    Lab Results: Basic Metabolic Panel:  Basename 08/11/11 0629 08/10/11 0523  NA 137 135  K 4.6 4.2  CL 101 101  CO2 30 27  GLUCOSE 192* 196*  BUN <3* <3*  CREATININE 0.63 0.59  CALCIUM 9.2 8.8  MG -- --  PHOS -- --   Liver Function Tests:  [facility] 08/11/11 0629  AST 19  ALT 18  ALKPHOS 53  BILITOT 0.2*  PROT 6.0  ALBUMIN 2.7*     CBC:  Basename 08/11/11 0629 08/10/11 0523  WBC 12.1* 12.2*    NEUTROABS -- --  HGB 12.4 12.4  HCT 37.6 37.4  MCV 97.2 97.7  PLT 479* 486*    Recent Results (from the past 240 hour(s))  CLOSTRIDIUM DIFFICILE BY PCR     Status: Abnormal   Collection Time   08/06/11  3:30 PM      Component Value Range Status Comment   C difficile by pcr POSITIVE (*) NEGATIVE  Final      Hospital Course: This very pleasant 45 year old lady was admitted with symptoms of abdominal pain and loose stools for 5 days. She has been on every week course of antibiotics for sinusitis. Stool was positive for C. difficile toxin by PCR. She was appropriately started on metronidazole. Unfortunately this did not improve her symptoms in a timely fashion and therefore she was switched to vancomycin. This has improved her  symptoms to the point where I think she is stable enough to be discharged home. She has not really opened her bowels but she does have some mild abdominal pain for which she is taking oral opioids as needed. She has not had a fever. She has had no nausea or vomiting. She is tolerating food just fine.  Discharge Exam: Blood pressure 115/73, pulse 74, temperature 98.4 F (36.9 C), temperature source Oral, resp. rate 20, height 5\' 2"  (1.575 m), weight 85.9 kg (189 lb 6 oz), SpO2 97.00%. She looks systemically well. Abdomen is soft and mildly tender. Bowel sounds are heard. There is no obvious rebound tenderness to indicate any significant peritonitis. Heart sounds are present and normal. Lung fields are clear. She is alert and oriented.  Disposition: Home. She will further 5 day course of vancomycin to finish the course. She will followup with her primary care physician.  Discharge Orders    Future Orders Please Complete By Expires   Diet - low sodium heart healthy      Increase activity slowly           SignedWilson Singer Pager [phone]  08/12/2011, 10:27 AM

## 2011-08-12 NOTE — Progress Notes (Signed)
   CARE MANAGEMENT NOTE 08/12/2011  Patient:  [name], [name]   Account Number:  [id number]  Date Initiated:  08/10/2011  Documentation initiated by:  Rosemary Holms  Subjective/Objective Assessment:   Pt admitted with n,v,d. PTA pt lived at home.     Action/Plan:   Spoke to pt at bedside. No HH DME needs identified.   Anticipated DC Date:  08/12/2011   Anticipated DC Plan:  HOME/SELF CARE      DC Planning Services  CM consult      Choice offered to / List presented to:             Status of service:  Completed, signed off Medicare Important Message given?   (If response is "NO", the following Medicare IM given date fields will be blank) Date Medicare IM given:   Date Additional Medicare IM given:    Discharge Disposition:  HOME/SELF CARE  Per UR Regulation:    If discussed at Long Length of Stay Meetings, dates discussed:    Comments:  08/12/11 1000 Rosemary Holms RN BSN CM  08/10/11 1300 Rudie Sermons Leanord Hawking RN BSN CM

## 2011-08-22 ENCOUNTER — Encounter (HOSPITAL_COMMUNITY): Payer: Self-pay

## 2011-08-22 ENCOUNTER — Inpatient Hospital Stay (HOSPITAL_COMMUNITY)
Admission: EM | Admit: 2011-08-22 | Discharge: 2011-08-29 | DRG: 182 | Disposition: A | Discharge status: Home / Self Care | Payer: BC Managed Care – PPO | Attending: Internal Medicine | Admitting: Internal Medicine

## 2011-08-22 ENCOUNTER — Emergency Department (HOSPITAL_COMMUNITY): Payer: BC Managed Care – PPO

## 2011-08-22 DIAGNOSIS — K529 Noninfective gastroenteritis and colitis, unspecified: Secondary | ICD-10-CM

## 2011-08-22 DIAGNOSIS — E860 Dehydration: Secondary | ICD-10-CM

## 2011-08-22 DIAGNOSIS — R1013 Epigastric pain: Secondary | ICD-10-CM | POA: Diagnosis present

## 2011-08-22 DIAGNOSIS — E119 Type 2 diabetes mellitus without complications: Secondary | ICD-10-CM | POA: Diagnosis present

## 2011-08-22 DIAGNOSIS — D7289 Other specified disorders of white blood cells: Secondary | ICD-10-CM

## 2011-08-22 DIAGNOSIS — E1165 Type 2 diabetes mellitus with hyperglycemia: Secondary | ICD-10-CM | POA: Diagnosis present

## 2011-08-22 DIAGNOSIS — E785 Hyperlipidemia, unspecified: Secondary | ICD-10-CM | POA: Diagnosis present

## 2011-08-22 DIAGNOSIS — R1319 Other dysphagia: Secondary | ICD-10-CM | POA: Diagnosis present

## 2011-08-22 DIAGNOSIS — E7800 Pure hypercholesterolemia, unspecified: Secondary | ICD-10-CM | POA: Diagnosis present

## 2011-08-22 DIAGNOSIS — K2960 Other gastritis without bleeding: Secondary | ICD-10-CM | POA: Diagnosis present

## 2011-08-22 DIAGNOSIS — R109 Unspecified abdominal pain: Secondary | ICD-10-CM | POA: Diagnosis present

## 2011-08-22 DIAGNOSIS — B370 Candidal stomatitis: Secondary | ICD-10-CM

## 2011-08-22 DIAGNOSIS — A0471 Enterocolitis due to Clostridium difficile, recurrent: Secondary | ICD-10-CM | POA: Diagnosis present

## 2011-08-22 DIAGNOSIS — A0472 Enterocolitis due to Clostridium difficile, not specified as recurrent: Principal | ICD-10-CM | POA: Diagnosis present

## 2011-08-22 DIAGNOSIS — K5900 Constipation, unspecified: Secondary | ICD-10-CM | POA: Diagnosis not present

## 2011-08-22 DIAGNOSIS — I10 Essential (primary) hypertension: Secondary | ICD-10-CM | POA: Diagnosis present

## 2011-08-22 DIAGNOSIS — IMO0001 Reserved for inherently not codable concepts without codable children: Secondary | ICD-10-CM | POA: Diagnosis present

## 2011-08-22 DIAGNOSIS — R197 Diarrhea, unspecified: Secondary | ICD-10-CM

## 2011-08-22 DIAGNOSIS — D72829 Elevated white blood cell count, unspecified: Secondary | ICD-10-CM | POA: Diagnosis present

## 2011-08-22 HISTORY — DX: Enterocolitis due to Clostridium difficile, not specified as recurrent: A04.72

## 2011-08-22 HISTORY — DX: Other histiocytosis syndromes: D76.3

## 2011-08-22 LAB — DIFFERENTIAL
Basophils Absolute: 0.1 10*3/uL (ref 0.0–0.1)
Basophils Relative: 0 % (ref 0–1)
Eosinophils Absolute: 1 10*3/uL — ABNORMAL HIGH (ref 0.0–0.7)
Eosinophils Relative: 6 % — ABNORMAL HIGH (ref 0–5)
Lymphocytes Relative: 19 % (ref 12–46)
Lymphs Abs: 3 10*3/uL (ref 0.7–4.0)
Monocytes Absolute: 1.7 10*3/uL — ABNORMAL HIGH (ref 0.1–1.0)
Monocytes Relative: 10 % (ref 3–12)
Neutro Abs: 10.5 10*3/uL — ABNORMAL HIGH (ref 1.7–7.7)
Neutrophils Relative %: 65 % (ref 43–77)

## 2011-08-22 LAB — COMPREHENSIVE METABOLIC PANEL
ALT: 55 U/L — ABNORMAL HIGH (ref 0–35)
AST: 49 U/L — ABNORMAL HIGH (ref 0–37)
Albumin: 3.6 g/dL (ref 3.5–5.2)
Alkaline Phosphatase: 72 U/L (ref 39–117)
BUN: 8 mg/dL (ref 6–23)
CO2: 23 mEq/L (ref 19–32)
Calcium: 9.2 mg/dL (ref 8.4–10.5)
Chloride: 98 mEq/L (ref 96–112)
Creatinine, Ser: 0.57 mg/dL (ref 0.50–1.10)
GFR calc Af Amer: >90 mL/min (ref >90)
GFR calc non Af Amer: >90 mL/min (ref >90)
Glucose, Bld: 230 mg/dL — ABNORMAL HIGH (ref 70–99)
Potassium: 3.6 mEq/L (ref 3.5–5.1)
Sodium: 137 mEq/L (ref 135–145)
Total Bilirubin: 0.4 mg/dL (ref 0.3–1.2)
Total Protein: 7.2 g/dL (ref 6.0–8.3)

## 2011-08-22 LAB — CBC
HCT: 44.9 % (ref 36.0–46.0)
Hemoglobin: 15.5 g/dL — ABNORMAL HIGH (ref 12.0–15.0)
MCH: 33.3 pg (ref 26.0–34.0)
MCHC: 34.5 g/dL (ref 30.0–36.0)
MCV: 96.4 fL (ref 78.0–100.0)
Platelets: 573 10*3/uL — ABNORMAL HIGH (ref 150–400)
RBC: 4.66 MIL/uL (ref 3.87–5.11)
RDW: 14.1 % (ref 11.5–15.5)
WBC: 16.3 10*3/uL — ABNORMAL HIGH (ref 4.0–10.5)

## 2011-08-22 LAB — GLUCOSE, CAPILLARY: Glucose-Capillary: 176 mg/dL — ABNORMAL HIGH (ref 70–99)

## 2011-08-22 LAB — LIPASE, BLOOD: Lipase: 30 U/L (ref 11–59)

## 2011-08-22 IMAGING — CR DG ABDOMEN ACUTE W/ 1V CHEST
3 series · 3 of 3 positions shown · non-contrast
Comparison: CT abdomen and pelvis [DATE], [DATE],
[DATE], [DATE], [DATE], [DATE], [DATE],
[DATE], [DATE], [DATE].

CLINICAL DATA: Several week history of persistent mid abdominal
pain.  Patient recently treated for Clostridium difficile.
Surgical history includes splenectomy for thrombocytopenia.
History of cavernous transformation of the portal vein.

ACUTE ABDOMEN SERIES (ABDOMEN 2 VIEW & CHEST 1 VIEW) [DATE]:

[view not recorded (1 of 3)]
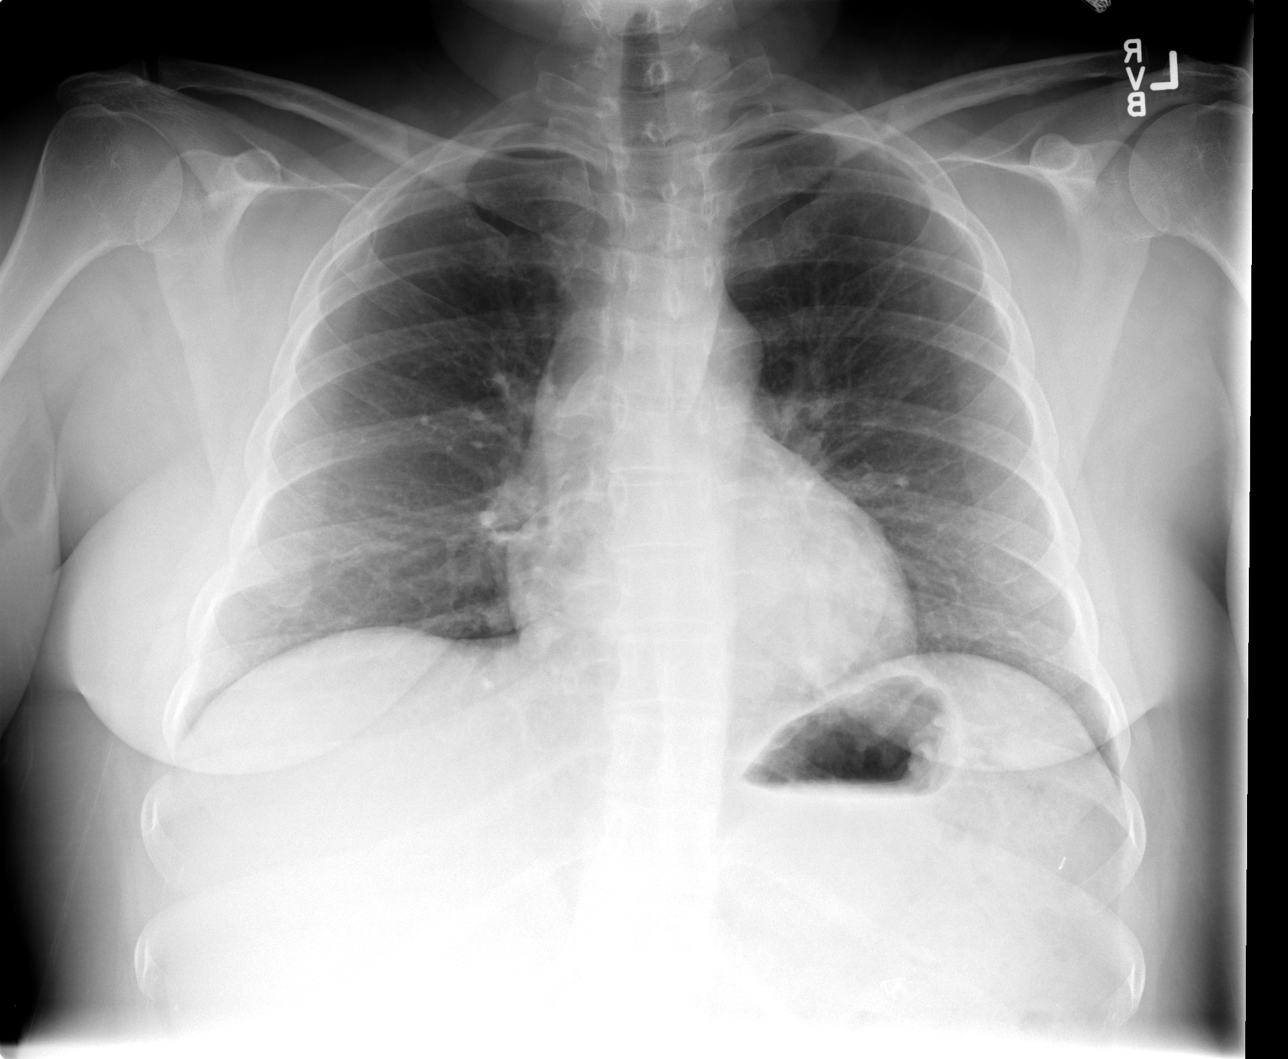

[view not recorded (2 of 3)]
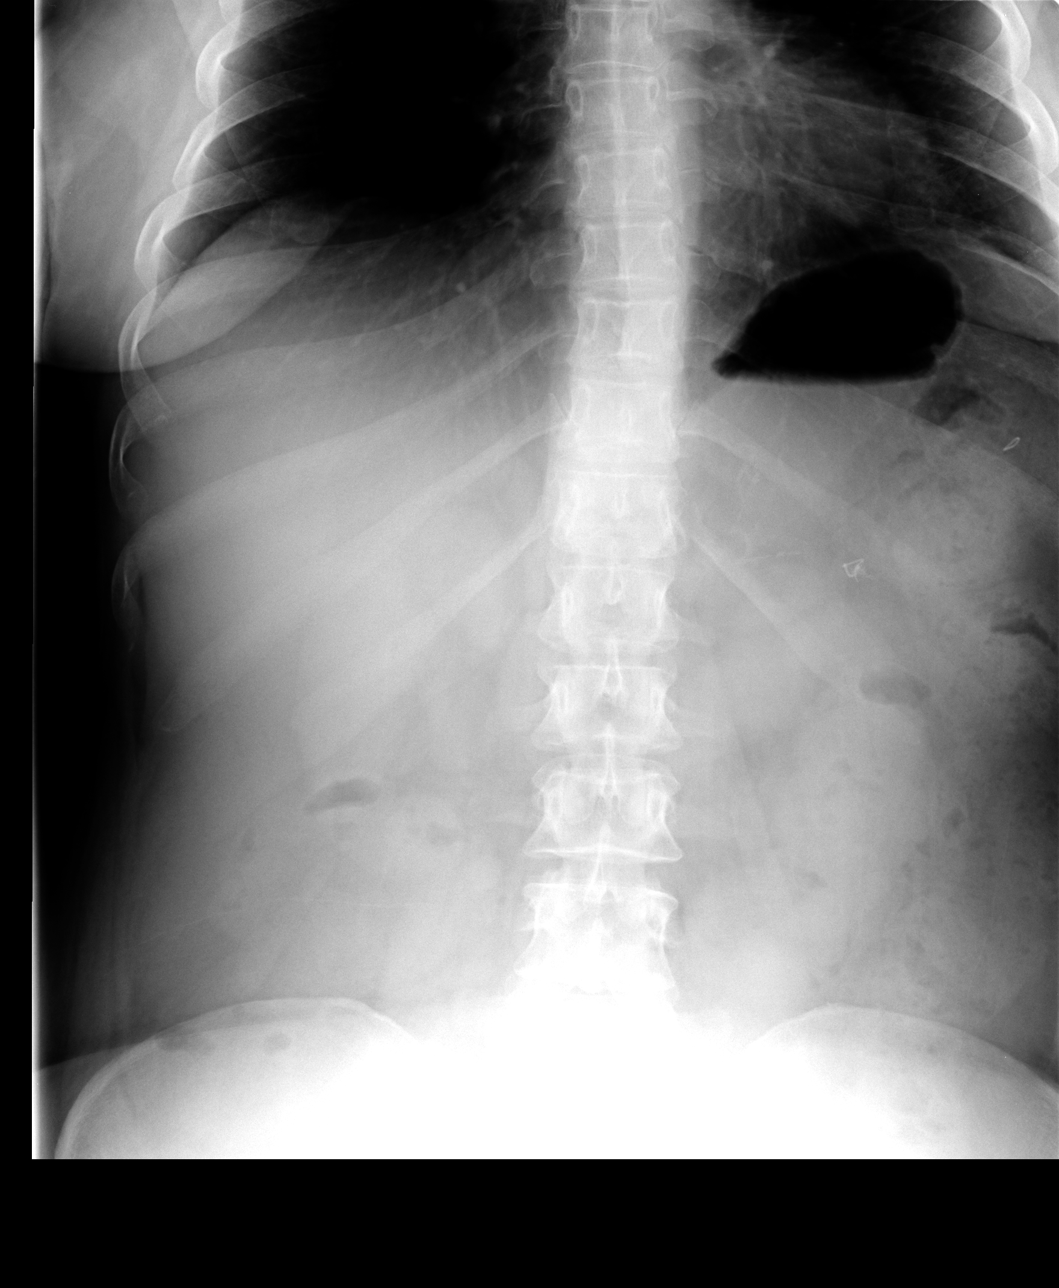

[view not recorded (3 of 3)]
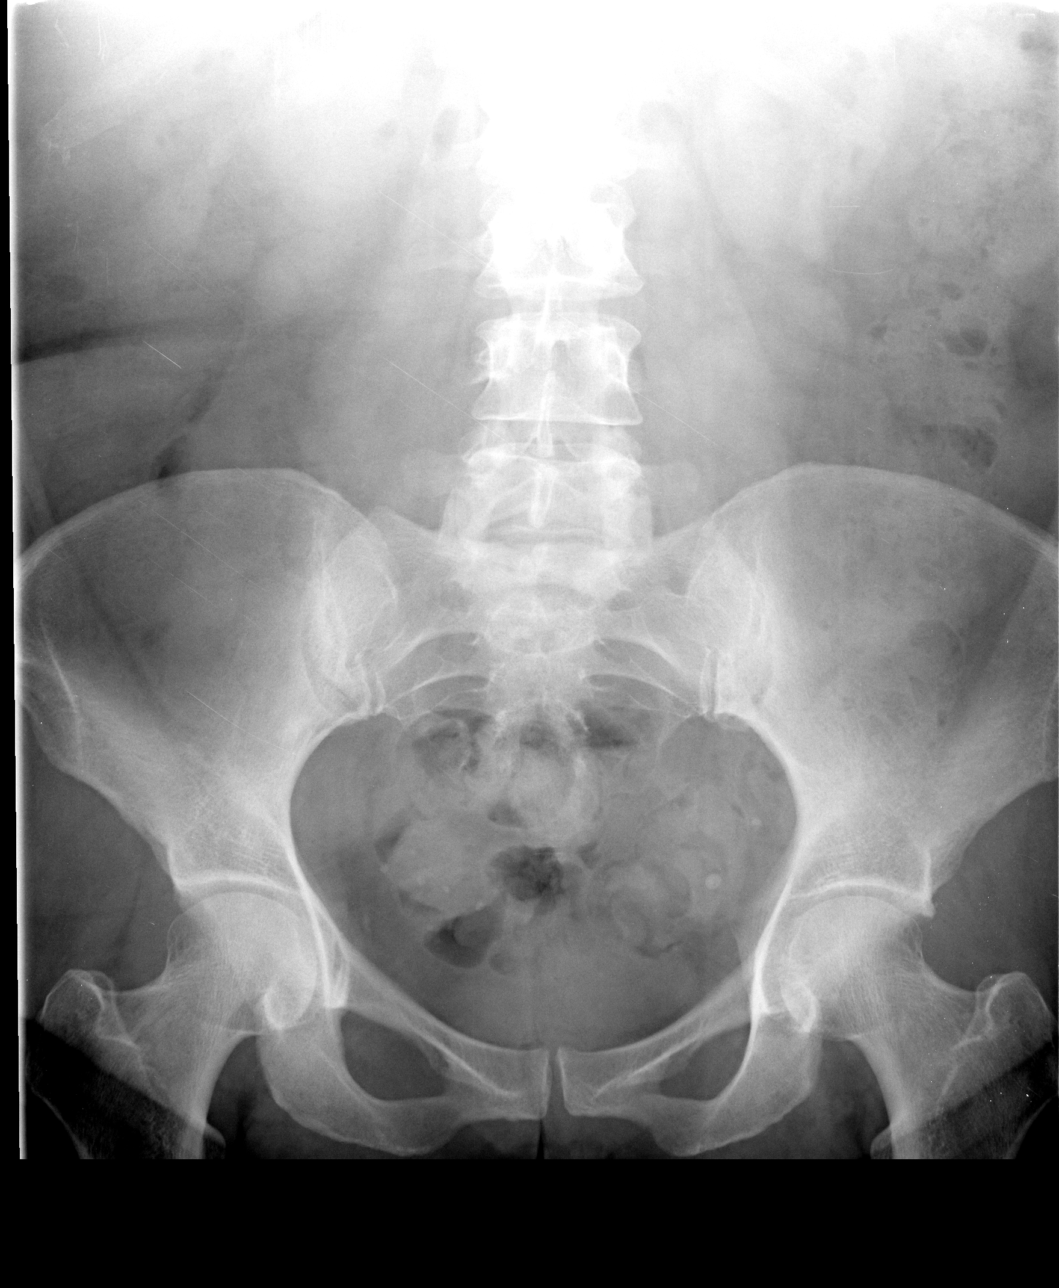

[3 of 3 positions shown; findings below may reference images not displayed]

FINDINGS: Bowel gas pattern unremarkable without evidence of
obstruction or significant ileus.  No evidence of free air or
significant air fluid levels on the erect image.  Surgical clips in
the left upper quadrant from prior splenectomy.  Phleboliths in
both sides of the pelvis.  No visible opaque urinary tract calculi.
Psoas margins visible.  Regional skeleton intact.

Cardiomediastinal silhouette unremarkable.   Lungs clear.
Bronchovascular markings normal.  Pulmonary vascularity normal.  No
pneumothorax.  No pleural effusions.
IMPRESSION: No acute abdominal or pulmonary abnormality.

## 2011-08-22 MED ORDER — VANCOMYCIN 50 MG/ML ORAL SOLUTION
125.0000 mg | Freq: Four times a day (QID) | ORAL | Status: DC
  Administered 2011-08-22 – 2011-08-29 (×23): 125 mg via ORAL
  Filled 2011-08-22 (×28): qty 2.5

## 2011-08-22 MED ORDER — CYCLOBENZAPRINE HCL 10 MG PO TABS: 10.0000 mg | ORAL_TABLET | Freq: Three times a day (TID) | ORAL | Status: DC | PRN

## 2011-08-22 MED ORDER — VANCOMYCIN 50 MG/ML ORAL SOLUTION: 125.0000 mg | Freq: Every day | ORAL | Status: DC

## 2011-08-22 MED ORDER — PANTOPRAZOLE SODIUM 40 MG IV SOLR
40.0000 mg | INTRAVENOUS | Status: DC
  Administered 2011-08-22 – 2011-08-23 (×2): 40 mg via INTRAVENOUS
  Filled 2011-08-22 (×3): qty 40

## 2011-08-22 MED ORDER — ONDANSETRON HCL 4 MG/2ML IJ SOLN
4.0000 mg | Freq: Once | INTRAMUSCULAR | Status: AC
  Administered 2011-08-22: 4 mg via INTRAVENOUS
  Filled 2011-08-22: qty 2

## 2011-08-22 MED ORDER — SIMVASTATIN 10 MG PO TABS
10.0000 mg | ORAL_TABLET | Freq: Every day | ORAL | Status: DC
  Administered 2011-08-22 – 2011-08-28 (×7): 10 mg via ORAL
  Filled 2011-08-22 (×7): qty 1

## 2011-08-22 MED ORDER — MAGIC MOUTHWASH W/LIDOCAINE
15.0000 mL | Freq: Three times a day (TID) | ORAL | Status: DC
Start: 2011-08-22 — End: 2011-08-22

## 2011-08-22 MED ORDER — POTASSIUM CHLORIDE IN NACL 20-0.9 MEQ/L-% IV SOLN
INTRAVENOUS | Status: DC
  Administered 2011-08-22: 1000 mL via INTRAVENOUS
  Administered 2011-08-23: 12:00:00 via INTRAVENOUS
  Administered 2011-08-24: 1000 mL via INTRAVENOUS

## 2011-08-22 MED ORDER — MAGIC MOUTHWASH
7.5000 mL | Freq: Three times a day (TID) | ORAL | Status: DC
  Administered 2011-08-22 – 2011-08-25 (×10): 7.5 mL via ORAL
  Administered 2011-08-26: 17:00:00 via ORAL
  Administered 2011-08-26 – 2011-08-28 (×7): 7.5 mL via ORAL
  Administered 2011-08-29: 5 mL via ORAL
  Filled 2011-08-22 (×2): qty 10
  Filled 2011-08-22: qty 5
  Filled 2011-08-22 (×2): qty 10
  Filled 2011-08-22 (×2): qty 5
  Filled 2011-08-22: qty 10
  Filled 2011-08-22: qty 5
  Filled 2011-08-22: qty 10
  Filled 2011-08-22 (×2): qty 5
  Filled 2011-08-22: qty 10
  Filled 2011-08-22: qty 5
  Filled 2011-08-22: qty 10
  Filled 2011-08-22 (×2): qty 5
  Filled 2011-08-22 (×2): qty 10

## 2011-08-22 MED ORDER — VANCOMYCIN 50 MG/ML ORAL SOLUTION
125.0000 mg | ORAL | Status: DC
Start: 2011-09-21 — End: 2011-08-29
  Filled 2011-08-22: qty 2.5

## 2011-08-22 MED ORDER — FLUOXETINE HCL 20 MG PO CAPS
20.0000 mg | ORAL_CAPSULE | Freq: Every day | ORAL | Status: DC
  Administered 2011-08-22 – 2011-08-29 (×8): 20 mg via ORAL
  Filled 2011-08-22 (×8): qty 1

## 2011-08-22 MED ORDER — LISINOPRIL 10 MG PO TABS
20.0000 mg | ORAL_TABLET | Freq: Every day | ORAL | Status: DC
  Administered 2011-08-22 – 2011-08-23 (×2): 20 mg via ORAL
  Filled 2011-08-22: qty 1
  Filled 2011-08-22: qty 2

## 2011-08-22 MED ORDER — ENOXAPARIN SODIUM 40 MG/0.4ML ~~LOC~~ SOLN
40.0000 mg | SUBCUTANEOUS | Status: DC
  Administered 2011-08-22 – 2011-08-25 (×4): 40 mg via SUBCUTANEOUS
  Filled 2011-08-22 (×4): qty 0.4

## 2011-08-22 MED ORDER — VANCOMYCIN 50 MG/ML ORAL SOLUTION: 125.0000 mg | ORAL | Status: DC

## 2011-08-22 MED ORDER — ONDANSETRON HCL 4 MG PO TABS
4.0000 mg | ORAL_TABLET | Freq: Four times a day (QID) | ORAL | Status: DC | PRN
  Administered 2011-08-28 – 2011-08-29 (×2): 4 mg via ORAL
  Filled 2011-08-22: qty 1

## 2011-08-22 MED ORDER — HYDROMORPHONE HCL PF 1 MG/ML IJ SOLN
1.0000 mg | Freq: Once | INTRAMUSCULAR | Status: AC
  Administered 2011-08-22: 1 mg via INTRAVENOUS
  Filled 2011-08-22: qty 1

## 2011-08-22 MED ORDER — VANCOMYCIN 50 MG/ML ORAL SOLUTION
125.0000 mg | Freq: Two times a day (BID) | ORAL | Status: DC
  Administered 2011-08-29 (×2): 125 mg via ORAL
  Filled 2011-08-22 (×4): qty 2.5

## 2011-08-22 MED ORDER — ONDANSETRON HCL 4 MG/2ML IJ SOLN: 4.0000 mg | Freq: Three times a day (TID) | INTRAMUSCULAR | Status: DC | PRN

## 2011-08-22 MED ORDER — HYDROMORPHONE HCL PF 1 MG/ML IJ SOLN
1.0000 mg | INTRAMUSCULAR | Status: DC | PRN
  Administered 2011-08-22 – 2011-08-26 (×15): 1 mg via INTRAVENOUS
  Filled 2011-08-22 (×16): qty 1

## 2011-08-22 MED ORDER — SODIUM CHLORIDE 0.9 % IV BOLUS (SEPSIS)
1000.0000 mL | Freq: Once | INTRAVENOUS | Status: AC
  Administered 2011-08-22: 1000 mL via INTRAVENOUS

## 2011-08-22 MED ORDER — ONDANSETRON HCL 4 MG/2ML IJ SOLN
4.0000 mg | Freq: Four times a day (QID) | INTRAMUSCULAR | Status: DC | PRN
  Administered 2011-08-25: 4 mg via INTRAVENOUS
  Filled 2011-08-22 (×2): qty 2

## 2011-08-22 MED ORDER — SODIUM CHLORIDE 0.9 % IV SOLN
INTRAVENOUS | Status: AC
  Administered 2011-08-22: 17:00:00 via INTRAVENOUS

## 2011-08-22 MED ORDER — SACCHAROMYCES BOULARDII 250 MG PO CAPS
250.0000 mg | ORAL_CAPSULE | Freq: Two times a day (BID) | ORAL | Status: DC
  Administered 2011-08-22 – 2011-08-29 (×14): 250 mg via ORAL
  Filled 2011-08-22 (×14): qty 1

## 2011-08-22 MED ORDER — INSULIN ASPART 100 UNIT/ML ~~LOC~~ SOLN
0.0000 [IU] | Freq: Every day | SUBCUTANEOUS | Status: DC
  Administered 2011-08-23 – 2011-08-24 (×2): 2 [IU] via SUBCUTANEOUS
  Administered 2011-08-26 – 2011-08-27 (×2): 3 [IU] via SUBCUTANEOUS
  Administered 2011-08-28: 2 [IU] via SUBCUTANEOUS

## 2011-08-22 MED ORDER — VANCOMYCIN HCL 500 MG IV SOLR
INTRAVENOUS | Status: AC
  Filled 2011-08-22: qty 500

## 2011-08-22 MED ORDER — LIDOCAINE VISCOUS 2 % MT SOLN
7.5000 mL | Freq: Three times a day (TID) | OROMUCOSAL | Status: DC
  Administered 2011-08-22 – 2011-08-29 (×18): 7.5 mL via OROMUCOSAL
  Filled 2011-08-22 (×7): qty 15
  Filled 2011-08-22: qty 30
  Filled 2011-08-22 (×10): qty 15

## 2011-08-22 MED ORDER — HYDROMORPHONE HCL PF 1 MG/ML IJ SOLN: 1.0000 mg | INTRAMUSCULAR | Status: DC | PRN

## 2011-08-22 MED ORDER — INSULIN ASPART 100 UNIT/ML ~~LOC~~ SOLN
0.0000 [IU] | Freq: Three times a day (TID) | SUBCUTANEOUS | Status: DC
  Administered 2011-08-23: 3 [IU] via SUBCUTANEOUS
  Administered 2011-08-23 – 2011-08-24 (×3): 5 [IU] via SUBCUTANEOUS
  Administered 2011-08-24 – 2011-08-25 (×4): 3 [IU] via SUBCUTANEOUS
  Administered 2011-08-25 – 2011-08-26 (×2): 5 [IU] via SUBCUTANEOUS
  Administered 2011-08-26: 8 [IU] via SUBCUTANEOUS
  Administered 2011-08-26: 5 [IU] via SUBCUTANEOUS
  Administered 2011-08-27 (×2): 3 [IU] via SUBCUTANEOUS
  Administered 2011-08-27 – 2011-08-28 (×2): 5 [IU] via SUBCUTANEOUS
  Administered 2011-08-28: 8 [IU] via SUBCUTANEOUS
  Administered 2011-08-28: 3 [IU] via SUBCUTANEOUS

## 2011-08-22 MED ORDER — ACETAMINOPHEN 325 MG PO TABS
650.0000 mg | ORAL_TABLET | Freq: Four times a day (QID) | ORAL | Status: DC | PRN
  Administered 2011-08-24 – 2011-08-27 (×3): 650 mg via ORAL
  Filled 2011-08-22 (×3): qty 2

## 2011-08-22 MED ORDER — ACETAMINOPHEN 650 MG RE SUPP: 650.0000 mg | Freq: Four times a day (QID) | RECTAL | Status: DC | PRN

## 2011-08-22 NOTE — ED Notes (Signed)
Pt stable at transfer

## 2011-08-22 NOTE — H&P (Signed)
PCP:   No primary provider on file.   Chief Complaint:  Abdominal pain  HPI: This is a 46 year old female who was recently in the hospital for Clostridium difficile colitis. She was discharged home on April 24 to complete a course of oral vancomycin. She reports at that time that she had felt somewhat improved. She reports that her vomiting had resolved, her diarrhea has improved. She reports that yesterday her symptoms have gotten significantly worse. She began having diffuse abdominal pain. She began vomiting and having multiple loose stools. She has felt feverish. She denies any blood in her stools. Denies any other complaints. She was evaluated in the emergency room where she was noted to be dehydrated, had an elevated WBC count. Patient has been referred for admission  Allergies:   Allergies  Allergen Reactions  . Aspartame And Phenylalanine Shortness Of Breath and Swelling    Throat swells  ANY ARTIFICAL SWEETNERS  . Other Swelling    Artifical sweetners  . Sucralose Rash    Pt.s face broke out in rash after drinking Breeza.  Pt. Claims it is from the artificial sweetener.         Past Medical History  Diagnosis Date  . Diabetes mellitus   . Hypertension   . High cholesterol     Past Surgical History  Procedure Date  . Splenectomy   . Tonsillectomy   . Abdominal hysterectomy     Prior to Admission medications   Medication Sig Start Date End Date Taking? Authorizing Provider  bismuth subsalicylate (PEPTO BISMOL) 262 MG/15ML suspension Take 15 mLs by mouth every 6 (six) hours as needed.   Yes Historical Provider, MD  cyclobenzaprine (FLEXERIL) 10 MG tablet Take 10 mg by mouth 3 (three) times daily as needed.   Yes Historical Provider, MD  FLUoxetine (PROZAC) 20 MG capsule Take 20 mg by mouth daily.   Yes Historical Provider, MD  glipiZIDE (GLUCOTROL) 10 MG tablet Take 10 mg by mouth 2 (two) times daily before a meal.   Yes Historical Provider, MD  lisinopril  (PRINIVIL,ZESTRIL) 20 MG tablet Take 20 mg by mouth daily.   Yes Historical Provider, MD  metFORMIN (GLUCOPHAGE) 1000 MG tablet Take 1,000 mg by mouth 2 (two) times daily.   Yes Historical Provider, MD  pantoprazole (PROTONIX) 40 MG tablet Take 40 mg by mouth daily.   Yes Historical Provider, MD  pravastatin (PRAVACHOL) 20 MG tablet Take 20 mg by mouth daily.   Yes Historical Provider, MD  saxagliptin HCl (ONGLYZA) 5 MG TABS tablet Take 5 mg by mouth daily.   Yes Historical Provider, MD  sennosides-docusate sodium (SENOKOT-S) 8.6-50 MG tablet Take 1 tablet by mouth daily. 08/12/11 08/11/12 Yes Nimish Normajean Glasgow, MD    Social History:  reports that she has never smoked. She does not have any smokeless tobacco history on file. She reports that she does not drink alcohol or use illicit drugs.  No family history on file.  Review of Systems:  Constitutional: Denies fever, chills, diaphoresis, appetite change and fatigue.  HEENT: Denies photophobia, eye pain, redness, hearing loss, ear pain, congestion, sore throat, rhinorrhea, sneezing, mouth sores, trouble swallowing, neck pain, neck stiffness and tinnitus.   Respiratory: Denies SOB, DOE, cough, chest tightness,  and wheezing.   Cardiovascular: Denies chest pain, palpitations and leg swelling.  Gastrointestinal: Denies nausea, vomiting, abdominal pain, diarrhea, constipation, blood in stool and abdominal distention.  Genitourinary: Denies dysuria, urgency, frequency, hematuria, flank pain and difficulty urinating.  Musculoskeletal: Denies myalgias,  back pain, joint swelling, arthralgias and gait problem.  Skin: Denies pallor, rash and wound.  Neurological: Denies dizziness, seizures, syncope, weakness, light-headedness, numbness and headaches.  Hematological: Denies adenopathy. Easy bruising, personal or family bleeding history  Psychiatric/Behavioral: Denies suicidal ideation, mood changes, confusion, nervousness, sleep disturbance and  agitation   Physical Exam: Blood pressure 115/67, pulse 94, resp. rate 22, SpO2 95.00%. General: Patient is lying in bed, alert and oriented x3, she does appear uncomfortable from abdominal pain. HEENT: Normocephalic, atraumatic, pupils are equal round react to light, oral thrush is noted in the oral mucosa Neck: Supple Chest: Clear to auscultation bilaterally cardiac: S1, S2, regular rate and rhythm Abdomen: Soft, mild tenderness diffusely, bowel sounds are sluggish, nondistended Extremities: No cyanosis, clubbing, edema Neurologic: Grossly intact, nonfocal  Skin: No visible rashes  Labs on Admission:  Results for orders placed during the hospital encounter of 08/22/11 (from the past 48 hour(s))  CBC     Status: Abnormal   Collection Time   08/22/11  3:31 PM      Component Value Range Comment   WBC 16.3 (*) 4.0 - 10.5 (K/uL)    RBC 4.66  3.87 - 5.11 (MIL/uL)    Hemoglobin 15.5 (*) 12.0 - 15.0 (g/dL)    HCT 16.1  09.6 - 04.5 (%)    MCV 96.4  78.0 - 100.0 (fL)    MCH 33.3  26.0 - 34.0 (pg)    MCHC 34.5  30.0 - 36.0 (g/dL)    RDW 40.9  81.1 - 91.4 (%)    Platelets 573 (*) 150 - 400 (K/uL)   DIFFERENTIAL     Status: Abnormal   Collection Time   08/22/11  3:31 PM      Component Value Range Comment   Neutrophils Relative 65  43 - 77 (%)    Neutro Abs 10.5 (*) 1.7 - 7.7 (K/uL)    Lymphocytes Relative 19  12 - 46 (%)    Lymphs Abs 3.0  0.7 - 4.0 (K/uL)    Monocytes Relative 10  3 - 12 (%)    Monocytes Absolute 1.7 (*) 0.1 - 1.0 (K/uL)    Eosinophils Relative 6 (*) 0 - 5 (%)    Eosinophils Absolute 1.0 (*) 0.0 - 0.7 (K/uL)    Basophils Relative 0  0 - 1 (%)    Basophils Absolute 0.1  0.0 - 0.1 (K/uL)   COMPREHENSIVE METABOLIC PANEL     Status: Abnormal   Collection Time   08/22/11  3:31 PM      Component Value Range Comment   Sodium 137  135 - 145 (mEq/L)    Potassium 3.6  3.5 - 5.1 (mEq/L)    Chloride 98  96 - 112 (mEq/L)    CO2 23  19 - 32 (mEq/L)    Glucose, Bld 230 (*) 70 -  99 (mg/dL)    BUN 8  6 - 23 (mg/dL)    Creatinine, Ser 7.82  0.50 - 1.10 (mg/dL)    Calcium 9.2  8.4 - 10.5 (mg/dL)    Total Protein 7.2  6.0 - 8.3 (g/dL)    Albumin 3.6  3.5 - 5.2 (g/dL)    AST 49 (*) 0 - 37 (U/L)    ALT 55 (*) 0 - 35 (U/L)    Alkaline Phosphatase 72  39 - 117 (U/L)    Total Bilirubin 0.4  0.3 - 1.2 (mg/dL)    GFR calc non Af Amer >90  >90 (mL/min)  GFR calc Af Amer >90  >90 (mL/min)   LIPASE, BLOOD     Status: Normal   Collection Time   08/22/11  3:31 PM      Component Value Range Comment   Lipase 30  11 - 59 (U/L)     Radiological Exams on Admission: Dg Abd Acute W/chest  08/22/2011  *RADIOLOGY REPORT*  Clinical Data: Several week history of persistent mid abdominal pain.  Patient recently treated for Clostridium difficile. 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  ACUTE ABDOMEN SERIES (ABDOMEN 2 VIEW & CHEST 1 VIEW) 08/22/2011:  Comparison: CT abdomen and pelvis 08/09/2011, 08/05/2011, 01/11/2009, 08/28/2008, 02/02/2008, 08/21/2007, 08/15/2007, 07/14/2007, 07/06/2007, 05/31/2007.  Findings: Bowel gas pattern unremarkable without evidence of obstruction or significant ileus.  No evidence of free air or significant air fluid levels on the erect image.  Surgical clips in the left upper quadrant from prior splenectomy.  Phleboliths in both sides of the pelvis.  No visible opaque urinary tract calculi. Psoas margins visible.  Regional skeleton intact.  Cardiomediastinal silhouette unremarkable.   Lungs clear. Bronchovascular markings normal.  Pulmonary vascularity normal.  No pneumothorax.  No pleural effusions.  IMPRESSION: No acute abdominal or pulmonary abnormality.  Original Report Authenticated By: Arnell Sieving, M.D.    Assessment/Plan Principal Problem:  *Recurrent colitis due to Clostridium difficile Active Problems:  Hypertension  High cholesterol  Diabetes type 2, uncontrolled   Dehydration  Leukocytosis  Oral thrush  Plan:  It appears the patient is a case of recurrent Clostridium difficile colitis. I suspect she'll need a prolonged vancomycin taper. We will order antibiotics per Clostridium difficile pharmacy protocol. We will also start probiotics. We will check a repeat stool C. difficile PCR. Patient will be rehydrated with IV fluids. Plain films of her abdomen are unremarkable. We will hold off on repeating a CT scan at this time. If the patient's clinical condition changes, then this can certainly be repeated. Her oral thrush is likely secondary to recent antibiotic use. She will receive Magic mouthwash. Patient will be continued on IV fluids. We will keep her on a clear liquid diet, this can be advanced as tolerated. She'll be kept on sliding scale insulin for diabetes. Patient will be monitored closely.  Further orders per the clinical course   Time Spent on Admission:  Terry Bolotin [facility] Pager: [phone] 08/22/2011, 5:54 PM

## 2011-08-22 NOTE — Progress Notes (Signed)
Pharmacy Note: C.Diff protocol initiated by MD w/ Vancomycin taper via order set. Plan: Follow peripherally. Mady Gemma, [facility] 08/22/2011 6:54 PM

## 2011-08-22 NOTE — ED Notes (Signed)
Pt reports that she was released from hospital 10 days ago, had c-diff, ab cramping, nausea, and vomiting, returned yesterday

## 2011-08-22 NOTE — ED Provider Notes (Cosign Needed)
History     CSN: [id number]  Arrival date & time 08/22/11  1420   First MD Initiated Contact with Patient 08/22/11 1421      Chief Complaint  Patient presents with  . Abdominal Pain    (Consider location/radiation/quality/duration/timing/severity/associated sxs/prior treatment) Patient is a 46 y.o. female presenting with abdominal pain. The history is provided by the patient (the pt compleins of loose stools and abd cramps.  pt recently txed for c. diff). No language interpreter was used.  Abdominal Pain The primary symptoms of the illness include abdominal pain and diarrhea. The primary symptoms of the illness do not include fatigue. The current episode started more than 2 days ago. The onset of the illness was sudden. The problem has not changed since onset. The abdominal pain began 2 days ago. The pain came on gradually. The abdominal pain has been unchanged since its onset. The abdominal pain is generalized. The abdominal pain does not radiate. The severity of the abdominal pain is 7/10. The abdominal pain is relieved by nothing. The abdominal pain is exacerbated by vomiting.  The illness is associated with eating. The patient states that she believes she is currently not pregnant. The patient has had a change in bowel habit. Symptoms associated with the illness do not include chills, hematuria, frequency or back pain. Significant associated medical issues do not include PUD.    Past Medical History  Diagnosis Date  . Diabetes mellitus   . Hypertension   . High cholesterol     Past Surgical History  Procedure Date  . Splenectomy   . Tonsillectomy   . Abdominal hysterectomy     No family history on file.  History  Substance Use Topics  . Smoking status: Never Smoker   . Smokeless tobacco: Not on file  . Alcohol Use: No    OB History    Grav Para Term Preterm Abortions TAB SAB Ect Mult Living                  Review of Systems  Constitutional: Negative for chills  and fatigue.  HENT: Negative for congestion, sinus pressure and ear discharge.   Eyes: Negative for discharge.  Respiratory: Negative for cough.   Cardiovascular: Negative for chest pain.  Gastrointestinal: Positive for abdominal pain and diarrhea.  Genitourinary: Negative for frequency and hematuria.  Musculoskeletal: Negative for back pain.  Skin: Negative for rash.  Neurological: Negative for seizures and headaches.  Hematological: Negative.   Psychiatric/Behavioral: Negative for hallucinations.    Allergies  Aspartame and phenylalanine; Other; and Sucralose  Home Medications   Current Outpatient Rx  Name Route Sig Dispense Refill  . BISMUTH SUBSALICYLATE 262 MG/15ML PO SUSP Oral Take 15 mLs by mouth every 6 (six) hours as needed.    . CYCLOBENZAPRINE HCL 10 MG PO TABS Oral Take 10 mg by mouth 3 (three) times daily as needed.    Marland Kitchen FLUOXETINE HCL 20 MG PO CAPS Oral Take 20 mg by mouth daily.    Marland Kitchen GLIPIZIDE 10 MG PO TABS Oral Take 10 mg by mouth 2 (two) times daily before a meal.    . LISINOPRIL 20 MG PO TABS Oral Take 20 mg by mouth daily.    Marland Kitchen METFORMIN HCL 1000 MG PO TABS Oral Take 1,000 mg by mouth 2 (two) times daily.    Marland Kitchen PANTOPRAZOLE SODIUM 40 MG PO TBEC Oral Take 40 mg by mouth daily.    Marland Kitchen PRAVASTATIN SODIUM 20 MG PO TABS Oral Take  20 mg by mouth daily.    Marland Kitchen SAXAGLIPTIN HCL 5 MG PO TABS Oral Take 5 mg by mouth daily.    . SENNA-DOCUSATE SODIUM 8.6-50 MG PO TABS Oral Take 1 tablet by mouth daily. 30 tablet 0    There were no vitals taken for this visit.  Physical Exam  Constitutional: She is oriented to person, place, and time. She appears well-developed.  HENT:  Head: Normocephalic and atraumatic.  Eyes: Conjunctivae and EOM are normal. No scleral icterus.  Neck: Neck supple. No thyromegaly present.  Cardiovascular: Normal rate and regular rhythm.  Exam reveals no gallop and no friction rub.   No murmur heard. Pulmonary/Chest: No stridor. She has no wheezes. She  has no rales. She exhibits no tenderness.  Abdominal: She exhibits no distension. There is tenderness. There is no rebound.  Musculoskeletal: Normal range of motion. She exhibits no edema.  Lymphadenopathy:    She has no cervical adenopathy.  Neurological: She is oriented to person, place, and time. Coordination normal.  Skin: No rash noted. No erythema.  Psychiatric: She has a normal mood and affect. Her behavior is normal.    ED Course  Procedures (including critical care time)  Labs Reviewed  CBC - Abnormal; Notable for the following:    WBC 16.3 (*)    Hemoglobin 15.5 (*)    Platelets 573 (*)    All other components within normal limits  DIFFERENTIAL - Abnormal; Notable for the following:    Neutro Abs 10.5 (*)    Monocytes Absolute 1.7 (*)    Eosinophils Relative 6 (*)    Eosinophils Absolute 1.0 (*)    All other components within normal limits  COMPREHENSIVE METABOLIC PANEL - Abnormal; Notable for the following:    Glucose, Bld 230 (*)    AST 49 (*)    ALT 55 (*)    All other components within normal limits  LIPASE, BLOOD   Dg Abd Acute W/chest  08/22/2011  *RADIOLOGY REPORT*  Clinical Data: Several week history of persistent mid abdominal pain.  Patient recently treated for Clostridium difficile. 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  ACUTE ABDOMEN SERIES (ABDOMEN 2 VIEW & CHEST 1 VIEW) 08/22/2011:  Comparison: CT abdomen and pelvis 08/09/2011, 08/05/2011, 01/11/2009, 08/28/2008, 02/02/2008, 08/21/2007, 08/15/2007, 07/14/2007, 07/06/2007, 05/31/2007.  Findings: Bowel gas pattern unremarkable without evidence of obstruction or significant ileus.  No evidence of free air or significant air fluid levels on the erect image.  Surgical clips in the left upper quadrant from prior splenectomy.  Phleboliths in both sides of the pelvis.  No visible opaque urinary tract calculi. Psoas margins visible.  Regional  skeleton intact.  Cardiomediastinal silhouette unremarkable.   Lungs clear. Bronchovascular markings normal.  Pulmonary vascularity normal.  No pneumothorax.  No pleural effusions.  IMPRESSION: No acute abdominal or pulmonary abnormality.  Original Report Authenticated By: Arnell Sieving, M.D.     1. Colitis       MDM          Benny Lennert, MD 08/22/11 [phone]

## 2011-08-23 DIAGNOSIS — B370 Candidal stomatitis: Secondary | ICD-10-CM

## 2011-08-23 DIAGNOSIS — D7289 Other specified disorders of white blood cells: Secondary | ICD-10-CM

## 2011-08-23 DIAGNOSIS — E860 Dehydration: Secondary | ICD-10-CM

## 2011-08-23 DIAGNOSIS — R197 Diarrhea, unspecified: Secondary | ICD-10-CM

## 2011-08-23 LAB — COMPREHENSIVE METABOLIC PANEL
ALT: 41 U/L — ABNORMAL HIGH (ref 0–35)
AST: 33 U/L (ref 0–37)
Albumin: 3.1 g/dL — ABNORMAL LOW (ref 3.5–5.2)
Alkaline Phosphatase: 59 U/L (ref 39–117)
BUN: 6 mg/dL (ref 6–23)
CO2: 26 mEq/L (ref 19–32)
Calcium: 8.5 mg/dL (ref 8.4–10.5)
Chloride: 99 mEq/L (ref 96–112)
Creatinine, Ser: 0.6 mg/dL (ref 0.50–1.10)
GFR calc Af Amer: >90 mL/min (ref >90)
GFR calc non Af Amer: >90 mL/min (ref >90)
Glucose, Bld: 187 mg/dL — ABNORMAL HIGH (ref 70–99)
Potassium: 4.1 mEq/L (ref 3.5–5.1)
Sodium: 135 mEq/L (ref 135–145)
Total Bilirubin: 0.3 mg/dL (ref 0.3–1.2)
Total Protein: 6.5 g/dL (ref 6.0–8.3)

## 2011-08-23 LAB — CBC
HCT: 40 % (ref 36.0–46.0)
Hemoglobin: 13.2 g/dL (ref 12.0–15.0)
MCH: 32.4 pg (ref 26.0–34.0)
MCHC: 33 g/dL (ref 30.0–36.0)
MCV: 98 fL (ref 78.0–100.0)
Platelets: 520 10*3/uL — ABNORMAL HIGH (ref 150–400)
RBC: 4.08 MIL/uL (ref 3.87–5.11)
RDW: 14.4 % (ref 11.5–15.5)
WBC: 13.2 10*3/uL — ABNORMAL HIGH (ref 4.0–10.5)

## 2011-08-23 LAB — GLUCOSE, CAPILLARY
Glucose-Capillary: 183 mg/dL — ABNORMAL HIGH (ref 70–99)
Glucose-Capillary: 226 mg/dL — ABNORMAL HIGH (ref 70–99)
Glucose-Capillary: 227 mg/dL — ABNORMAL HIGH (ref 70–99)
Glucose-Capillary: 215 mg/dL — ABNORMAL HIGH (ref 70–99)

## 2011-08-23 LAB — CLOSTRIDIUM DIFFICILE BY PCR: Toxigenic C. Difficile by PCR: NEGATIVE

## 2011-08-23 NOTE — Progress Notes (Signed)
Subjective: Doesn't feel significantly changed since yesterday.  Still having significant abdominal pain. Has not has further vomiting.   Objective: Vital signs in last 24 hours: Temp:  [97.7 F (36.5 C)-98.3 F (36.8 C)] 97.7 F (36.5 C) (05/05 0418) Pulse Rate:  [69-97] 69  (05/05 0418) Resp:  [20-22] 20  (05/05 0418) BP: (104-115)/(61-71) 107/61 mmHg (05/05 0418) SpO2:  [95 %-96 %] 96 % (05/05 0418) Weight:  [82.8 kg (182 lb 8.7 oz)] 82.8 kg (182 lb 8.7 oz) (05/04 1841) Weight change:  Last BM Date: 08/22/11  Intake/Output from previous day: 05/04 0701 - 05/05 0700 In: 240 [P.O.:240] Out: 500 [Urine:500] Total I/O In: 320 [P.O.:320] Out: 750 [Urine:750]   Physical Exam: General: Alert, awake, oriented x3, in no acute distress. HEENT: No bruits, no goiter. Heart: Regular rate and rhythm, without murmurs, rubs, gallops. Lungs: Clear to auscultation bilaterally. Abdomen: Soft, diffuse tenderness, nondistended, positive bowel sounds. Extremities: No clubbing cyanosis or edema with positive pedal pulses. Neuro: Grossly intact, nonfocal.    Lab Results: Basic Metabolic Panel:  Basename 08/23/11 0431 08/22/11 1531  NA 135 137  K 4.1 3.6  CL 99 98  CO2 26 23  GLUCOSE 187* 230*  BUN 6 8  CREATININE 0.60 0.57  CALCIUM 8.5 9.2  MG -- --  PHOS -- --   Liver Function Tests:  [facility] 08/23/11 0431 08/22/11 1531  AST 33 49*  ALT 41* 55*  ALKPHOS 59 72  BILITOT 0.3 0.4  PROT 6.5 7.2  ALBUMIN 3.1* 3.6    Basename 08/22/11 1531  LIPASE 30  AMYLASE --   No results found for this basename: AMMONIA:2 in the last 72 hours CBC:  Basename 08/23/11 0431 08/22/11 1531  WBC 13.2* 16.3*  NEUTROABS -- 10.5*  HGB 13.2 15.5*  HCT 40.0 44.9  MCV 98.0 96.4  PLT 520* 573*   Cardiac Enzymes: No results found for this basename: CKTOTAL:3,CKMB:3,CKMBINDEX:3,TROPONINI:3 in the last 72 hours BNP: No results found for this basename: PROBNP:3 in the last 72  hours D-Dimer: No results found for this basename: DDIMER:2 in the last 72 hours CBG:  Basename 08/23/11 1147 08/23/11 0738 08/22/11 2009  GLUCAP 226* 183* 176*   Hemoglobin A1C: No results found for this basename: HGBA1C in the last 72 hours Fasting Lipid Panel: No results found for this basename: CHOL,HDL,LDLCALC,TRIG,CHOLHDL,LDLDIRECT in the last 72 hours Thyroid Function Tests: No results found for this basename: TSH,T4TOTAL,FREET4,T3FREE,THYROIDAB in the last 72 hours Anemia Panel: No results found for this basename: VITAMINB12,FOLATE,FERRITIN,TIBC,IRON,RETICCTPCT in the last 72 hours Coagulation: No results found for this basename: LABPROT:2,INR:2 in the last 72 hours Urine Drug Screen: Drugs of Abuse  No results found for this basename: labopia, cocainscrnur, labbenz, amphetmu, thcu, labbarb    Alcohol Level: No results found for this basename: ETH:2 in the last 72 hours Urinalysis: No results found for this basename: COLORURINE:2,APPERANCEUR:2,LABSPEC:2,PHURINE:2,GLUCOSEU:2,HGBUR:2,BILIRUBINUR:2,KETONESUR:2,PROTEINUR:2,UROBILINOGEN:2,NITRITE:2,LEUKOCYTESUR:2 in the last 72 hours  No results found for this or any previous visit (from the past 240 hour(s)).  Studies/Results: Dg Abd Acute W/chest  08/22/2011  *RADIOLOGY REPORT*  Clinical Data: Several week history of persistent mid abdominal pain.  Patient recently treated for Clostridium difficile. 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  ACUTE ABDOMEN SERIES (ABDOMEN 2 VIEW & CHEST 1 VIEW) 08/22/2011:  Comparison: CT abdomen and pelvis 08/09/2011, 08/05/2011, 01/11/2009, 08/28/2008, 02/02/2008, 08/21/2007, 08/15/2007, 07/14/2007, 07/06/2007, 05/31/2007.  Findings: Bowel gas pattern unremarkable without evidence of obstruction or significant ileus.  No evidence  of free air or significant air fluid levels on the erect image.  Surgical clips in the left  upper quadrant from prior splenectomy.  Phleboliths in both sides of the pelvis.  No visible opaque urinary tract calculi. Psoas margins visible.  Regional skeleton intact.  Cardiomediastinal silhouette unremarkable.   Lungs clear. Bronchovascular markings normal.  Pulmonary vascularity normal.  No pneumothorax.  No pleural effusions.  IMPRESSION: No acute abdominal or pulmonary abnormality.  Original Report Authenticated By: Arnell Sieving, M.D.    Medications: Scheduled Meds:   . sodium chloride   Intravenous STAT  . enoxaparin  40 mg Subcutaneous Q24H  . FLUoxetine  20 mg Oral Daily  .  HYDROmorphone (DILAUDID) injection  1 mg Intravenous Once  . HYDROmorphone  1 mg Intravenous Once  . HYDROmorphone  1 mg Intravenous Once  . insulin aspart  0-15 Units Subcutaneous TID WC  . insulin aspart  0-5 Units Subcutaneous QHS  . magic mouthwash  7.5 mL Oral TID   And  . lidocaine  7.5 mL Mouth/Throat TID  . lisinopril  20 mg Oral Daily  . ondansetron  4 mg Intravenous Once  . ondansetron  4 mg Intravenous Once  . pantoprazole (PROTONIX) IV  40 mg Intravenous Q24H  . saccharomyces boulardii  250 mg Oral BID  . simvastatin  10 mg Oral q1800  . sodium chloride  1,000 mL Intravenous Once  . vancomycin  125 mg Oral QID   Followed by  . vancomycin  125 mg Oral BID   Followed by  . vancomycin  125 mg Oral Daily   Followed by  . vancomycin  125 mg Oral QODAY   Followed by  . vancomycin  125 mg Oral Q3 days  . DISCONTD: magic mouthwash w/lidocaine  15 mL Oral TID   Continuous Infusions:   . 0.9 % NaCl with KCl 20 mEq / L 100 mL/hr at 08/23/11 1215   PRN Meds:.acetaminophen, acetaminophen, cyclobenzaprine, HYDROmorphone (DILAUDID) injection, ondansetron (ZOFRAN) IV, ondansetron, DISCONTD:  HYDROmorphone (DILAUDID) injection, DISCONTD: ondansetron (ZOFRAN) IV  Assessment/Plan:  Principal Problem:  *Recurrent colitis due to Clostridium difficile Active Problems:  Hypertension  High  cholesterol  Diabetes type 2, uncontrolled  Dehydration  Leukocytosis  Oral thrush  Plan:  This lady was readmitted yesterday for recurrent C diff. She had recurrent abd pain, vomiting, diarrhea and was found to be dehydrated with a leukocytosis.  She was started on treatment protocol for recurrent C diff with prolonged vanco taper. Stool C diff PCR still needs to be collected.  She is also on probiotics. Her WBC count appears to be improving.  She is on clear liquids, can consider advancing this tomorrow if she is improving  She is on magic mouthwash for oral thrush  Anticipate she will be ready for discharge in the next few days if she continues to improve.   LOS: 1 day   Ilean Spradlin [facility] Pager: [phone] 08/23/2011, 1:03 PM

## 2011-08-24 DIAGNOSIS — D7289 Other specified disorders of white blood cells: Secondary | ICD-10-CM

## 2011-08-24 DIAGNOSIS — B370 Candidal stomatitis: Secondary | ICD-10-CM

## 2011-08-24 DIAGNOSIS — R197 Diarrhea, unspecified: Secondary | ICD-10-CM

## 2011-08-24 DIAGNOSIS — E860 Dehydration: Secondary | ICD-10-CM

## 2011-08-24 LAB — CBC
HCT: 40.4 % (ref 36.0–46.0)
Hemoglobin: 13.4 g/dL (ref 12.0–15.0)
MCH: 32.7 pg (ref 26.0–34.0)
MCHC: 33.2 g/dL (ref 30.0–36.0)
MCV: 98.5 fL (ref 78.0–100.0)
Platelets: 516 10*3/uL — ABNORMAL HIGH (ref 150–400)
RBC: 4.1 MIL/uL (ref 3.87–5.11)
RDW: 14.3 % (ref 11.5–15.5)
WBC: 10.8 10*3/uL — ABNORMAL HIGH (ref 4.0–10.5)

## 2011-08-24 LAB — BASIC METABOLIC PANEL
BUN: 3 mg/dL — ABNORMAL LOW (ref 6–23)
CO2: 27 mEq/L (ref 19–32)
Calcium: 8.5 mg/dL (ref 8.4–10.5)
Chloride: 105 mEq/L (ref 96–112)
Creatinine, Ser: 0.65 mg/dL (ref 0.50–1.10)
GFR calc Af Amer: >90 mL/min (ref >90)
GFR calc non Af Amer: >90 mL/min (ref >90)
Glucose, Bld: 164 mg/dL — ABNORMAL HIGH (ref 70–99)
Potassium: 4.1 mEq/L (ref 3.5–5.1)
Sodium: 140 mEq/L (ref 135–145)

## 2011-08-24 LAB — GLUCOSE, CAPILLARY
Glucose-Capillary: 166 mg/dL — ABNORMAL HIGH (ref 70–99)
Glucose-Capillary: 173 mg/dL — ABNORMAL HIGH (ref 70–99)
Glucose-Capillary: 208 mg/dL — ABNORMAL HIGH (ref 70–99)
Glucose-Capillary: 202 mg/dL — ABNORMAL HIGH (ref 70–99)

## 2011-08-24 MED ORDER — PANTOPRAZOLE SODIUM 40 MG PO TBEC
40.0000 mg | DELAYED_RELEASE_TABLET | Freq: Every day | ORAL | Status: DC
  Administered 2011-08-24 – 2011-08-26 (×3): 40 mg via ORAL
  Filled 2011-08-24 (×3): qty 1

## 2011-08-24 MED ORDER — SODIUM CHLORIDE 0.9 % IV BOLUS (SEPSIS)
1000.0000 mL | Freq: Once | INTRAVENOUS | Status: AC
  Administered 2011-08-24: 1000 mL via INTRAVENOUS

## 2011-08-24 MED ORDER — LISINOPRIL 10 MG PO TABS
10.0000 mg | ORAL_TABLET | Freq: Every day | ORAL | Status: DC
  Administered 2011-08-25: 10 mg via ORAL
  Filled 2011-08-24: qty 1

## 2011-08-24 NOTE — Progress Notes (Signed)
Subjective: This lady has been able to eat without vomiting. She has opened her bowels without significant diarrhea. She continues to get abdominal pain, mainly in the epigastric area. She has had no fever.           Physical Exam: Blood pressure 96/61, pulse 113, temperature 98.3 F (36.8 C), temperature source Oral, resp. rate 20, height 5\' 2"  (1.575 m), weight 82.8 kg (182 lb 8.7 oz), SpO2 95.00%. She looks systemically well but appears to be in some discomfort. Abdomen is soft and mildly tender in a generalized fashion. Bowel sounds are clearly heard and are normal. Lung fields are clear. Heart sounds are present and normal. She is alert and orientated.   Investigations:  Recent Results (from the past 240 hour(s))  CLOSTRIDIUM DIFFICILE BY PCR     Status: Normal   Collection Time   08/23/11  2:50 PM      Component Value Range Status Comment   C difficile by pcr NEGATIVE  NEGATIVE  Final      Basic Metabolic Panel:  Basename 08/24/11 0440 08/23/11 0431  NA 140 135  K 4.1 4.1  CL 105 99  CO2 27 26  GLUCOSE 164* 187*  BUN 3* 6  CREATININE 0.65 0.60  CALCIUM 8.5 8.5  MG -- --  PHOS -- --   Liver Function Tests:  [facility] 08/23/11 0431 08/22/11 1531  AST 33 49*  ALT 41* 55*  ALKPHOS 59 72  BILITOT 0.3 0.4  PROT 6.5 7.2  ALBUMIN 3.1* 3.6     CBC:  Basename 08/24/11 0440 08/23/11 0431 08/22/11 1531  WBC 10.8* 13.2* --  NEUTROABS -- -- 10.5*  HGB 13.4 13.2 --  HCT 40.4 40.0 --  MCV 98.5 98.0 --  PLT 516* 520* --    Dg Abd Acute W/chest  08/22/2011  *RADIOLOGY REPORT*  Clinical Data: Several week history of persistent mid abdominal pain.  Patient recently treated for Clostridium difficile. 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  ACUTE ABDOMEN SERIES (ABDOMEN 2 VIEW & CHEST 1 VIEW) 08/22/2011:  Comparison: CT abdomen and pelvis 08/09/2011, 08/05/2011, 01/11/2009, 08/28/2008,  02/02/2008, 08/21/2007, 08/15/2007, 07/14/2007, 07/06/2007, 05/31/2007.  Findings: Bowel gas pattern unremarkable without evidence of obstruction or significant ileus.  No evidence of free air or significant air fluid levels on the erect image.  Surgical clips in the left upper quadrant from prior splenectomy.  Phleboliths in both sides of the pelvis.  No visible opaque urinary tract calculi. Psoas margins visible.  Regional skeleton intact.  Cardiomediastinal silhouette unremarkable.   Lungs clear. Bronchovascular markings normal.  Pulmonary vascularity normal.  No pneumothorax.  No pleural effusions.  IMPRESSION: No acute abdominal or pulmonary abnormality.  Original Report Authenticated By: Arnell Sieving, M.D.      Medications:  Scheduled:   . enoxaparin  40 mg Subcutaneous Q24H  . FLUoxetine  20 mg Oral Daily  . insulin aspart  0-15 Units Subcutaneous TID WC  . insulin aspart  0-5 Units Subcutaneous QHS  . magic mouthwash  7.5 mL Oral TID   And  . lidocaine  7.5 mL Mouth/Throat TID  . lisinopril  10 mg Oral Daily  . pantoprazole  40 mg Oral Q1200  . saccharomyces boulardii  250 mg Oral BID  . simvastatin  10 mg Oral q1800  . sodium chloride  1,000 mL Intravenous Once  . vancomycin  125 mg Oral QID   Followed by  . vancomycin  125 mg Oral BID   Followed by  . vancomycin  125 mg Oral Daily   Followed by  . vancomycin  125 mg Oral QODAY   Followed by  . vancomycin  125 mg Oral Q3 days  . DISCONTD: lisinopril  20 mg Oral Daily  . DISCONTD: pantoprazole (PROTONIX) IV  40 mg Intravenous Q24H    Impression: 1. 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 2. Hypertension, currently soft blood pressures. 3. Diabetes mellitus type 2.     Plan: 1. Reduce ACE inhibitor. 2. Discontinue IV fluids. 3. Advance diet. 4. Mobilize. 5. Continue with vancomycin.     LOS: 2 days   Wilson Singer Pager [phone]  08/24/2011, 11:23 AM

## 2011-08-24 NOTE — Progress Notes (Addendum)
INITIAL ADULT NUTRITION ASSESSMENT Date: 08/24/2011   Time: 2:32 PM Reason for Assessment: Nutrition Screen Unplanned wt loss  ASSESSMENT: Female 46 y.o.  Dx: Recurrent colitis due to Clostridium difficile   Past Medical History  Diagnosis Date  . Diabetes mellitus   . Hypertension   . High cholesterol     Scheduled Meds:   . enoxaparin  40 mg Subcutaneous Q24H  . FLUoxetine  20 mg Oral Daily  . insulin aspart  0-15 Units Subcutaneous TID WC  . insulin aspart  0-5 Units Subcutaneous QHS  . magic mouthwash  7.5 mL Oral TID   And  . lidocaine  7.5 mL Mouth/Throat TID  . lisinopril  10 mg Oral Daily  . pantoprazole  40 mg Oral Q1200  . saccharomyces boulardii  250 mg Oral BID  . simvastatin  10 mg Oral q1800  . sodium chloride  1,000 mL Intravenous Once  . vancomycin  125 mg Oral QID   Followed by  . vancomycin  125 mg Oral BID   Followed by  . vancomycin  125 mg Oral Daily   Followed by  . vancomycin  125 mg Oral QODAY   Followed by  . vancomycin  125 mg Oral Q3 days  . DISCONTD: lisinopril  20 mg Oral Daily  . DISCONTD: pantoprazole (PROTONIX) IV  40 mg Intravenous Q24H   Continuous Infusions:   . DISCONTD: 0.9 % NaCl with KCl 20 mEq / L 1,000 mL (08/24/11 0100)   PRN Meds:.acetaminophen, acetaminophen, cyclobenzaprine, HYDROmorphone (DILAUDID) injection, ondansetron (ZOFRAN) IV, ondansetron  Ht: 5\' 2"  (157.5 cm)  Wt: 182 lb 8.7 oz (82.8 kg)  Ideal Wt: 50.1 kg  % Ideal Wt: 166%  Usual Wt: 197# (<30d ago) % Usual Wt: 93%  Body mass index is 33.39 kg/(m^2). Obesity Class I  Food/Nutrition Related Hx: Pt d/c from [facility] 4/24 tx for Clostridium difficile colitis. Readmitted 5/4  reporting poor tol of oral intake, loose stools and unplanned wt loss of 15# <30d. Her diet has been advanced today says that she "was starving" and she consumed/tolerated 100% of lunch. She is at nutrition risk r/t significant wt decrease. Pt declines oral supplement at this  time.   CMP     Component Value Date/Time   NA 140 08/24/2011 0440   K 4.1 08/24/2011 0440   CL 105 08/24/2011 0440   CO2 27 08/24/2011 0440   GLUCOSE 164* 08/24/2011 0440   BUN 3* 08/24/2011 0440   CREATININE 0.65 08/24/2011 0440   CALCIUM 8.5 08/24/2011 0440   PROT 6.5 08/23/2011 0431   ALBUMIN 3.1* 08/23/2011 0431   AST 33 08/23/2011 0431   ALT 41* 08/23/2011 0431   ALKPHOS 59 08/23/2011 0431   BILITOT 0.3 08/23/2011 0431   GFRNONAA >90 08/24/2011 0440   GFRAA >90 08/24/2011 0440    Intake/Output Summary (Last 24 hours) at 08/24/11 1435 Last data filed at 08/24/11 1610  Gross per 24 hour  Intake    540 ml  Output   1850 ml  Net  -1310 ml     Diet Order: General-Regular   Supplements/Tube Feeding:none at this time  IVF:    DISCONTD: 0.9 % NaCl with KCl 20 mEq / L Last Rate: 1,000 mL (08/24/11 0100)    Estimated Nutritional Needs:   Kcal:1423-1565 kcal per day Protein:75-85 grams per day Fluid:1 ml/kcal  NUTRITION DIAGNOSIS: -Inadequate oral intake (NI-2.1).  Status: Ongoing  RELATED TO: recurrent colitis  AS EVIDENCE BY: pt hx  MONITORING/EVALUATION(Goals): -Tol of diet, wt trends and adequacy of nutrition intake -GOC: Pt will maintain oral intake to consistantly meet >75% of est nutr needs  EDUCATION NEEDS: -Education needs addressed  INTERVENTION: -Nutrition Services will obtain meal preferences to help maximize oral intake -RD to follow for nutritional needs  Dietitian [phone]  DOCUMENTATION CODES Per approved criteria  -Not Applicable    Francene Boyers 08/24/2011, 2:32 PM

## 2011-08-24 NOTE — Progress Notes (Addendum)
Inpatient Diabetes Program Recommendations  AACE/ADA: New Consensus Statement on Inpatient Glycemic Control  Target Ranges:  Prepandial:   less than 140 mg/dL      Peak postprandial:   less than 180 mg/dL (1-2 hours)      Critically ill patients:  140 - 180 mg/dL  Pager:  [phone] Hours:  8 am-10pm   Reason for Visit: Elevated glucose:  Results for DERRIONA, BRANSCOM (MRN [id number]) as of 08/24/2011 08:55  Ref. Range 08/23/2011 07:38 08/23/2011 11:47 08/23/2011 16:57 08/23/2011 20:03 08/24/2011 07:52  Glucose-Capillary Latest Range: 70-99 mg/dL 147 (H) 829 (H) 562 (H) 215 (H) 166 (H)    Inpatient Diabetes Program Recommendations Insulin - Basal: Consider adding low dose Lantus:  10 units daily    Note:  Please order outpatient diabetes education:  HbgA1c in April was 8.9%

## 2011-08-25 ENCOUNTER — Inpatient Hospital Stay (HOSPITAL_COMMUNITY): Payer: BC Managed Care – PPO

## 2011-08-25 DIAGNOSIS — D7289 Other specified disorders of white blood cells: Secondary | ICD-10-CM

## 2011-08-25 DIAGNOSIS — R197 Diarrhea, unspecified: Secondary | ICD-10-CM

## 2011-08-25 DIAGNOSIS — E860 Dehydration: Secondary | ICD-10-CM

## 2011-08-25 LAB — CBC
HCT: 40.1 % (ref 36.0–46.0)
Hemoglobin: 13.5 g/dL (ref 12.0–15.0)
MCH: 32.9 pg (ref 26.0–34.0)
MCHC: 33.7 g/dL (ref 30.0–36.0)
MCV: 97.8 fL (ref 78.0–100.0)
Platelets: ADEQUATE 10*3/uL (ref 150–400)
RBC: 4.1 MIL/uL (ref 3.87–5.11)
RDW: 14 % (ref 11.5–15.5)
WBC: 11.1 10*3/uL — ABNORMAL HIGH (ref 4.0–10.5)

## 2011-08-25 LAB — COMPREHENSIVE METABOLIC PANEL
ALT: 39 U/L — ABNORMAL HIGH (ref 0–35)
AST: 36 U/L (ref 0–37)
Albumin: 3.1 g/dL — ABNORMAL LOW (ref 3.5–5.2)
Alkaline Phosphatase: 71 U/L (ref 39–117)
BUN: 5 mg/dL — ABNORMAL LOW (ref 6–23)
CO2: 29 mEq/L (ref 19–32)
Calcium: 9.2 mg/dL (ref 8.4–10.5)
Chloride: 103 mEq/L (ref 96–112)
Creatinine, Ser: 0.63 mg/dL (ref 0.50–1.10)
GFR calc Af Amer: >90 mL/min (ref >90)
GFR calc non Af Amer: >90 mL/min (ref >90)
Glucose, Bld: 188 mg/dL — ABNORMAL HIGH (ref 70–99)
Potassium: 4.4 mEq/L (ref 3.5–5.1)
Sodium: 140 mEq/L (ref 135–145)
Total Bilirubin: 0.2 mg/dL — ABNORMAL LOW (ref 0.3–1.2)
Total Protein: 6.5 g/dL (ref 6.0–8.3)

## 2011-08-25 LAB — GLUCOSE, CAPILLARY
Glucose-Capillary: 196 mg/dL — ABNORMAL HIGH (ref 70–99)
Glucose-Capillary: 213 mg/dL — ABNORMAL HIGH (ref 70–99)
Glucose-Capillary: 196 mg/dL — ABNORMAL HIGH (ref 70–99)
Glucose-Capillary: 194 mg/dL — ABNORMAL HIGH (ref 70–99)

## 2011-08-25 MED ORDER — SENNOSIDES-DOCUSATE SODIUM 8.6-50 MG PO TABS
1.0000 | ORAL_TABLET | Freq: Two times a day (BID) | ORAL | Status: DC
Start: 2011-08-25 — End: 2011-08-29
  Administered 2011-08-25 – 2011-08-29 (×9): 1 via ORAL
  Filled 2011-08-25 (×9): qty 1

## 2011-08-25 MED ORDER — IOHEXOL 300 MG/ML  SOLN
100.0000 mL | Freq: Once | INTRAMUSCULAR | Status: AC | PRN
  Administered 2011-08-25: 100 mL via INTRAVENOUS

## 2011-08-25 NOTE — Care Management Note (Unsigned)
    Page 1 of 1   08/25/2011     3:39:21 PM   CARE MANAGEMENT NOTE 08/25/2011  Patient:  [name], [name]   Account Number:  [id number]  Date Initiated:  08/25/2011  Documentation initiated by:  Rosemary Holms  Subjective/Objective Assessment:   Pt admitted from home where she lives independently with spouse. c/o N/V and Abdominal pain.     Action/Plan:   Plans to DC home. No HH needs anticipated.   Anticipated DC Date:  08/26/2011   Anticipated DC Plan:  HOME/SELF CARE      DC Planning Services  CM consult      Choice offered to / List presented to:             Status of service:  In process, will continue to follow Medicare Important Message given?   (If response is "NO", the following Medicare IM given date fields will be blank) Date Medicare IM given:   Date Additional Medicare IM given:    Discharge Disposition:    Per UR Regulation:    If discussed at Long Length of Stay Meetings, dates discussed:    Comments:  08/24/11 1530 Denisse Whitenack Leanord Hawking RN BSN CM

## 2011-08-25 NOTE — Progress Notes (Signed)
UR Chart Review Completed  

## 2011-08-25 NOTE — Progress Notes (Signed)
Subjective: This lady tolerated an advanced diet yesterday without vomiting. Her bowels have not opened for the last couple of days. She still has significant epigastric and upper abdominal pain.           Physical Exam: Blood pressure 115/76, pulse 64, temperature 98.1 F (36.7 C), temperature source Oral, resp. rate 20, height 5\' 2"  (1.575 m), weight 82.8 kg (182 lb 8.7 oz), SpO2 96.00%. She looks systemically well but appears to be in some discomfort. Abdomen is soft and mildly tender in a generalized fashion. Bowel sounds are clearly heard and are normal. Lung fields are clear. Heart sounds are present and normal. She is alert and orientated.   Investigations:  Recent Results (from the past 240 hour(s))  CLOSTRIDIUM DIFFICILE BY PCR     Status: Normal   Collection Time   08/23/11  2:50 PM      Component Value Range Status Comment   C difficile by pcr NEGATIVE  NEGATIVE  Final      Basic Metabolic Panel:  Basename 08/25/11 0555 08/24/11 0440  NA 140 140  K 4.4 4.1  CL 103 105  CO2 29 27  GLUCOSE 188* 164*  BUN 5* 3*  CREATININE 0.63 0.65  CALCIUM 9.2 8.5  MG -- --  PHOS -- --   Liver Function Tests:  [facility] 08/25/11 0555 08/23/11 0431  AST 36 33  ALT 39* 41*  ALKPHOS 71 59  BILITOT 0.2* 0.3  PROT 6.5 6.5  ALBUMIN 3.1* 3.1*     CBC:  Basename 08/25/11 0555 08/24/11 0440 08/22/11 1531  WBC 11.1* 10.8* --  NEUTROABS -- -- 10.5*  HGB 13.5 13.4 --  HCT 40.1 40.4 --  MCV 97.8 98.5 --  PLT PLATELET CLUMPS NOTED ON SMEAR, COUNT APPEARS ADEQUATE 516* --        Medications:  Scheduled:    . enoxaparin  40 mg Subcutaneous Q24H  . FLUoxetine  20 mg Oral Daily  . insulin aspart  0-15 Units Subcutaneous TID WC  . insulin aspart  0-5 Units Subcutaneous QHS  . magic mouthwash  7.5 mL Oral TID   And  . lidocaine  7.5 mL Mouth/Throat TID  . lisinopril  10 mg Oral Daily  . pantoprazole  40 mg Oral Q1200  . saccharomyces boulardii  250 mg Oral BID  .  senna-docusate  1 tablet Oral BID  . simvastatin  10 mg Oral q1800  . vancomycin  125 mg Oral QID   Followed by  . vancomycin  125 mg Oral BID   Followed by  . vancomycin  125 mg Oral Daily   Followed by  . vancomycin  125 mg Oral QODAY   Followed by  . vancomycin  125 mg Oral Q3 days  . DISCONTD: lisinopril  20 mg Oral Daily  . DISCONTD: pantoprazole (PROTONIX) IV  40 mg Intravenous Q24H    Impression: 1. 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 2. Hypertension, currently soft blood pressures. 3. Diabetes mellitus type 2. 4. Constipation now.     Plan: 1. Continue with current therapy with vancomycin. 2. CT scan of the abdomen. I want to see if there is any significant deterioration in colitis. 3. Start Senokot-S one tablet twice a day.     LOS: 3 days   Wilson Singer Pager [phone]  08/25/2011, 8:32 AM

## 2011-08-26 ENCOUNTER — Encounter (HOSPITAL_COMMUNITY): Payer: Self-pay | Admitting: Gastroenterology

## 2011-08-26 DIAGNOSIS — R197 Diarrhea, unspecified: Secondary | ICD-10-CM

## 2011-08-26 DIAGNOSIS — Z8719 Personal history of other diseases of the digestive system: Secondary | ICD-10-CM

## 2011-08-26 DIAGNOSIS — E860 Dehydration: Secondary | ICD-10-CM

## 2011-08-26 DIAGNOSIS — R109 Unspecified abdominal pain: Secondary | ICD-10-CM

## 2011-08-26 DIAGNOSIS — D7289 Other specified disorders of white blood cells: Secondary | ICD-10-CM

## 2011-08-26 LAB — CBC
HCT: 39.7 % (ref 36.0–46.0)
Hemoglobin: 13.4 g/dL (ref 12.0–15.0)
MCH: 32.9 pg (ref 26.0–34.0)
MCHC: 33.8 g/dL (ref 30.0–36.0)
MCV: 97.5 fL (ref 78.0–100.0)
Platelets: 479 10*3/uL — ABNORMAL HIGH (ref 150–400)
RBC: 4.07 MIL/uL (ref 3.87–5.11)
RDW: 13.8 % (ref 11.5–15.5)
WBC: 10.5 10*3/uL (ref 4.0–10.5)

## 2011-08-26 LAB — COMPREHENSIVE METABOLIC PANEL
ALT: 34 U/L (ref 0–35)
AST: 30 U/L (ref 0–37)
Albumin: 3.1 g/dL — ABNORMAL LOW (ref 3.5–5.2)
Alkaline Phosphatase: 95 U/L (ref 39–117)
BUN: 5 mg/dL — ABNORMAL LOW (ref 6–23)
CO2: 32 mEq/L (ref 19–32)
Calcium: 9.2 mg/dL (ref 8.4–10.5)
Chloride: 100 mEq/L (ref 96–112)
Creatinine, Ser: 0.63 mg/dL (ref 0.50–1.10)
GFR calc Af Amer: >90 mL/min (ref >90)
GFR calc non Af Amer: >90 mL/min (ref >90)
Glucose, Bld: 220 mg/dL — ABNORMAL HIGH (ref 70–99)
Potassium: 3.9 mEq/L (ref 3.5–5.1)
Sodium: 138 mEq/L (ref 135–145)
Total Bilirubin: 0.2 mg/dL — ABNORMAL LOW (ref 0.3–1.2)
Total Protein: 6.5 g/dL (ref 6.0–8.3)

## 2011-08-26 LAB — GLUCOSE, CAPILLARY
Glucose-Capillary: 211 mg/dL — ABNORMAL HIGH (ref 70–99)
Glucose-Capillary: 260 mg/dL — ABNORMAL HIGH (ref 70–99)
Glucose-Capillary: 237 mg/dL — ABNORMAL HIGH (ref 70–99)
Glucose-Capillary: 276 mg/dL — ABNORMAL HIGH (ref 70–99)

## 2011-08-26 LAB — SEDIMENTATION RATE: Sed Rate: 12 mm/hr (ref 0–22)

## 2011-08-26 MED ORDER — SODIUM CHLORIDE 0.9 % IJ SOLN
INTRAMUSCULAR | Status: AC
  Administered 2011-08-26: 17:00:00
  Filled 2011-08-26: qty 3

## 2011-08-26 MED ORDER — OXYCODONE HCL 5 MG PO TABS
10.0000 mg | ORAL_TABLET | ORAL | Status: DC | PRN
Start: 2011-08-26 — End: 2011-08-29
  Administered 2011-08-26 – 2011-08-29 (×15): 10 mg via ORAL
  Filled 2011-08-26 (×9): qty 2
  Filled 2011-08-26: qty 1
  Filled 2011-08-26 (×2): qty 2
  Filled 2011-08-26: qty 1
  Filled 2011-08-26 (×4): qty 2

## 2011-08-26 MED ORDER — PROMETHAZINE HCL 25 MG/ML IJ SOLN
25.0000 mg | INTRAMUSCULAR | Status: AC
  Administered 2011-08-27: 25 mg via INTRAVENOUS
  Filled 2011-08-26: qty 1

## 2011-08-26 MED ORDER — PANTOPRAZOLE SODIUM 40 MG PO TBEC
40.0000 mg | DELAYED_RELEASE_TABLET | Freq: Every day | ORAL | Status: DC
  Administered 2011-08-27 – 2011-08-29 (×3): 40 mg via ORAL
  Filled 2011-08-26 (×3): qty 1

## 2011-08-26 NOTE — Progress Notes (Signed)
Inpatient Diabetes Program Recommendations  AACE/ADA: New Consensus Statement on Inpatient Glycemic Control  Target Ranges:  Prepandial:   less than 140 mg/dL      Peak postprandial:   less than 180 mg/dL (1-2 hours)      Critically ill patients:  140 - 180 mg/dL  Pager:  [phone] Hours:  8 am-10pm   Reason for Visit: Elevated glucose: 190s-210s  Inpatient Diabetes Program Recommendations Insulin - Basal: Consider adding low dose Lantus:  10 units daily Outpatient Referral: Please order outpatient diabetes education:  HgbA1C in April was 8.9% Diet: Add CHO modified medium to diet

## 2011-08-26 NOTE — Consult Note (Signed)
Referring Provider: Dr. Karilyn Cota Primary Care Physician:  No primary provider on file. Primary Gastroenterologist:  Dr. Jena Gauss   Date of Admission: 08/22/11 Date of Consultation: 08/26/11  Reason for Consultation: Abdominal pain, recent history of Cdiff colitis  HPI:  [name] is a 46 year old female who was recently inpatient for Cdiff colitis. Documented via PCR, CT findings as below. Treated initially with Flagyl while inpatient; however, this did not improve symptoms. Started on vanc. Discharged on 4/24 with significant improvement of symptoms.  Returned 5/4 with diffuse abdominal pain, vomiting, multiple loose stools. Cdiff PCR rechecked, NEGATIVE. CT scan 5/7 repeated with resolution of colitis, no evidence of residual or recurrent colitis. Appendix normal. 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   States diffuse abdominal pain since last admission, "draws her up". Worse than labor. Denies rectal bleeding. Possible melena last week, notes intermittent N/V. NO BM since admission. Diarrhea resolved. No hematemesis. Reports early satiety, epigastric discomfort with eating, feels like food "stops". 15 lbs wt loss per pt since first onset of Cdiff. Decreased appetite. Denies any hx of PUD. NSAIDs rare, no aspirin powders. Denies fever/chills.    Interestingly, has hx genetic disorder:  [employer], s/p splenectomy in 2009. Returned with splenic vein, superior mesenteric vein, portal vein thrombosis. Anticoagulated at that time. Was followed by Dr. Mariel Sleet. Unable to review records at this time.       Past Medical History  Diagnosis Date  . Diabetes mellitus   . Hypertension   . High cholesterol   . Clostridium difficile colitis   . Sea-blue histiocyte syndrome     Past Surgical History  Procedure Date  . Splenectomy   . Tonsillectomy   . Abdominal hysterectomy     Prior to Admission medications   Medication Sig Start Date End Date Taking?  Authorizing Provider  bismuth subsalicylate (PEPTO BISMOL) 262 MG/15ML suspension Take 15 mLs by mouth every 6 (six) hours as needed.   Yes Historical Provider, MD  cyclobenzaprine (FLEXERIL) 10 MG tablet Take 10 mg by mouth 3 (three) times daily as needed.   Yes Historical Provider, MD  FLUoxetine (PROZAC) 20 MG capsule Take 20 mg by mouth daily.   Yes Historical Provider, MD  glipiZIDE (GLUCOTROL) 10 MG tablet Take 10 mg by mouth 2 (two) times daily before a meal.   Yes Historical Provider, MD  lisinopril (PRINIVIL,ZESTRIL) 20 MG tablet Take 20 mg by mouth daily.   Yes Historical Provider, MD  metFORMIN (GLUCOPHAGE) 1000 MG tablet Take 1,000 mg by mouth 2 (two) times daily.   Yes Historical Provider, MD  pantoprazole (PROTONIX) 40 MG tablet Take 40 mg by mouth daily.   Yes Historical Provider, MD  pravastatin (PRAVACHOL) 20 MG tablet Take 20 mg by mouth daily.   Yes Historical Provider, MD  saxagliptin HCl (ONGLYZA) 5 MG TABS tablet Take 5 mg by mouth daily.   Yes Historical Provider, MD  sennosides-docusate sodium (SENOKOT-S) 8.6-50 MG tablet Take 1 tablet by mouth daily. 08/12/11 08/11/12 Yes Nimish Normajean Glasgow, MD    Current Facility-Administered Medications  Medication Dose Route Frequency Provider Last Rate Last Dose  . acetaminophen (TYLENOL) tablet 650 mg  650 mg Oral Q6H PRN Erick Blinks, MD   650 mg at 08/24/11 1122   Or  . acetaminophen (TYLENOL) suppository 650 mg  650 mg Rectal Q6H PRN Erick Blinks, MD      . cyclobenzaprine (FLEXERIL) tablet 10 mg  10 mg Oral TID PRN Erick Blinks, MD      .  FLUoxetine (PROZAC) capsule 20 mg  20 mg Oral Daily Erick Blinks, MD   20 mg at 08/26/11 1026  . insulin aspart (novoLOG) injection 0-15 Units  0-15 Units Subcutaneous TID WC Erick Blinks, MD   8 Units at 08/26/11 1247  . insulin aspart (novoLOG) injection 0-5 Units  0-5 Units Subcutaneous QHS Erick Blinks, MD   2 Units at 08/24/11 2209  . magic mouthwash  7.5 mL Oral TID Erick Blinks, MD   7.5 mL at 08/26/11 1026   And  . lidocaine (XYLOCAINE) 2 % viscous mouth solution 7.5 mL  7.5 mL Mouth/Throat TID Erick Blinks, MD   7.5 mL at 08/26/11 1121  . ondansetron (ZOFRAN) tablet 4 mg  4 mg Oral Q6H PRN Erick Blinks, MD       Or  . ondansetron (ZOFRAN) injection 4 mg  4 mg Intravenous Q6H PRN Erick Blinks, MD   4 mg at 08/25/11 1348  . oxyCODONE (Oxy IR/ROXICODONE) immediate release tablet 10 mg  10 mg Oral Q4H PRN Nimish Normajean Glasgow, MD   10 mg at 08/26/11 1247  . pantoprazole (PROTONIX) EC tablet 40 mg  40 mg Oral Q1200 Nimish C Karilyn Cota, MD   40 mg at 08/26/11 1025  . saccharomyces boulardii (FLORASTOR) capsule 250 mg  250 mg Oral BID Erick Blinks, MD   250 mg at 08/26/11 1025  . senna-docusate (Senokot-S) tablet 1 tablet  1 tablet Oral BID Wilson Singer, MD   1 tablet at 08/26/11 1025  . simvastatin (ZOCOR) tablet 10 mg  10 mg Oral q1800 Erick Blinks, MD   10 mg at 08/25/11 1800  . sodium chloride 0.9 % injection           . vancomycin (VANCOCIN) 50 mg/mL oral solution 125 mg  125 mg Oral QID Erick Blinks, MD   125 mg at 08/26/11 1247   Followed by  . vancomycin (VANCOCIN) 50 mg/mL oral solution 125 mg  125 mg Oral BID Erick Blinks, MD       Followed by  . vancomycin (VANCOCIN) 50 mg/mL oral solution 125 mg  125 mg Oral Daily Erick Blinks, MD       Followed by  . vancomycin (VANCOCIN) 50 mg/mL oral solution 125 mg  125 mg Oral QODAY Erick Blinks, MD       Followed by  . vancomycin (VANCOCIN) 50 mg/mL oral solution 125 mg  125 mg Oral Q3 days Erick Blinks, MD      . DISCONTD: enoxaparin (LOVENOX) injection 40 mg  40 mg Subcutaneous Q24H Erick Blinks, MD   40 mg at 08/25/11 2211  . DISCONTD: HYDROmorphone (DILAUDID) injection 1 mg  1 mg Intravenous Q2H PRN Erick Blinks, MD   1 mg at 08/26/11 0458  . DISCONTD: lisinopril (PRINIVIL,ZESTRIL) tablet 10 mg  10 mg Oral Daily Nimish C Karilyn Cota, MD   10 mg at 08/25/11 1040    Allergies as of 08/22/2011  - Review Complete 08/22/2011  Allergen Reaction Noted  . Aspartame and phenylalanine Shortness Of Breath and Swelling 08/05/2011  . Other Swelling 08/09/2011  . Sucralose Rash 08/09/2011    Family History  Problem Relation Age of Onset  . Colon cancer Neg Hx     History   Social History  . Marital Status: Married    Spouse Name: N/A    Number of Children: N/A  . Years of Education: N/A   Occupational History  . Not on file.   Social History Main Topics  .  Smoking status: Never Smoker   . Smokeless tobacco: Not on file  . Alcohol Use: No  . Drug Use: No  . Sexually Active: Yes    Birth Control/ Protection: None   Other Topics Concern  . Not on file   Social History Narrative  . No narrative on file    Review of Systems: Gen: SEE HPI CV: Denies chest pain, heart palpitations, syncope, edema  Resp: Denies shortness of breath with rest, cough, wheezing GI: SEE HPI GU : Denies urinary burning, urinary frequency, urinary incontinence.  MS: Denies joint pain,swelling, cramping Derm: Denies rash, itching, dry skin Psych: Denies depression, anxiety,confusion, or memory loss Heme: Denies bruising, bleeding, and enlarged lymph nodes.  Physical Exam: Vital signs in last 24 hours: Temp:  [98.2 F (36.8 C)-98.5 F (36.9 C)] 98.2 F (36.8 C) (05/08 0509) Pulse Rate:  [75] 75  (05/08 0509) Resp:  [20] 20  (05/08 0509) BP: (97-121)/(60-80) 97/60 mmHg (05/08 0509) SpO2:  [96 %-97 %] 97 % (05/08 0509) Last BM Date: 08/24/11 General:   Alert,  Well-developed, well-nourished, pleasant and cooperative in NAD Head:  Normocephalic and atraumatic. Eyes:  Sclera clear, no icterus.   Conjunctiva pink. Ears:  Normal auditory acuity. Nose:  No deformity, discharge,  or lesions. Mouth:  No deformity or lesions, dentition normal. Neck:  Supple; no masses or thyromegaly. Lungs:  Clear throughout to auscultation.   No wheezes, crackles, or rhonchi. No acute distress. Heart:   Regular rate and rhythm; no murmurs, clicks, rubs,  or gallops. Abdomen:  Soft, TTP epigastric, LUQ, LLQ. No masses, hepatosplenomegaly or hernias noted. Normal bowel sounds, without guarding, and without rebound.   Rectal:  Deferred  Msk:  Symmetrical without gross deformities. Normal posture. Pulses:  Normal pulses noted. Extremities:  Without clubbing or edema. Neurologic:  Alert and  oriented x4;  grossly normal neurologically. Skin:  Intact without significant lesions or rashes. Cervical Nodes:  No significant cervical adenopathy. Psych:  Alert and cooperative. Normal mood and affect.  Intake/Output from previous day: 05/07 0701 - 05/08 0700 In: 540 [P.O.:540] Out: -  Intake/Output this shift:    Lab Results:  Basename 08/26/11 0538 08/25/11 0555 08/24/11 0440  WBC 10.5 11.1* 10.8*  HGB 13.4 13.5 13.4  HCT 39.7 40.1 40.4  PLT 479* PLATELET CLUMPS NOTED ON SMEAR, COUNT APPEARS ADEQUATE 516*   BMET  Basename 08/26/11 0538 08/25/11 0555 08/24/11 0440  NA 138 140 140  K 3.9 4.4 4.1  CL 100 103 105  CO2 32 29 27  GLUCOSE 220* 188* 164*  BUN 5* 5* 3*  CREATININE 0.63 0.63 0.65  CALCIUM 9.2 9.2 8.5   LFT  Basename 08/26/11 0538 08/25/11 0555  PROT 6.5 6.5  ALBUMIN 3.1* 3.1*  AST 30 36  ALT 34 39*  ALKPHOS 95 71  BILITOT 0.2* 0.2*  BILIDIR -- --  IBILI -- --    Studies/Results:   CT abd/pelvis 2011-08-19:  IMPRESSION:  1. Pancolitis. The wall of the entire colon is circumferentially  thickened and there is prominence of the pericolonic vasculature  and slight pericolonic fat stranding. Findings could be due to  infectious colitis or inflammatory bowel disease. Consider testing  to exclude C difficile colitis.  2. 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  3. Fatty infiltration of the liver.  Ct Abdomen Pelvis W Contrast  08/25/2011  *RADIOLOGY REPORT*  Clinical Data: Abdominal cramping for 3 days.  History of colitis,  diabetes, hypertension,  splenectomy and hysterectomy.  Question recurrent colitis.  CT ABDOMEN AND PELVIS WITH CONTRAST  Technique:  Multidetector CT imaging of the abdomen and pelvis was performed following the standard protocol during bolus administration of intravenous contrast.  Contrast: OMNIPAQUE IOHEXOL 300 MG/ML  SOLN  Comparison: Abdominal pelvic CT 08/09/2011.  Findings: A small right pleural effusion has resolved.  The lung bases are clear.  There is no residual or recurrent colonic wall thickening or surrounding inflammatory change.  The appendix appears stable. There is no small bowel wall thickening.  Moderate stool is present throughout the colon.  Patient is status post splenectomy.  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  A few mildly enlarged mesenteric lymph nodes are unchanged.  There is no evidence of acute thrombosis.  The liver, gallbladder, pancreas, adrenal glands and kidneys appear stable.  The ovaries appear stable status post partial hysterectomy.  There are stable degenerative changes of the sacroiliac joints.  IMPRESSION:  1.  Interval resolution of colitis.  No evidence of residual or recurrent colitis. 2.  No other inflammatory changes demonstrated.  The appendix appears unchanged. 3.  Stable chronic findings related to cavernous transformation of the portal vein.  Original Report Authenticated By: Gerrianne Scale, M.D.    Impression: 46 year old female with recent hx of Cdiff colitis documented by PCR, CT at that time with pancolitis noted. Readmitted with diffuse abdominal pain, N/V, dyspepsia, sensation of food "sticking" in epigastric region, early satiety, reported weight loss, report of possible melena last week. Updated CT with resolution of colitis, negative Cdiff this admission. On Vanc taper (did not respond well to Flagyl during last hospital admission). No diarrhea since readmission.   Due to  dyspepsia, possible dysphagia, will proceed with upper endoscopy in the morning, dilation if needed. No need for lower GI evaluation unless develops rectal bleeding or other indications.   As side note, hx of fatty liver.LFTs essentially benign this admission. CT findings interesting with chronic findings of cavernous transformation of portal vein. Notes splenic vein, superior mesenteric vein and portal vein thrombosis shortly after splenectomy in 2009. Was followed by Dr. Mariel Sleet. I do not have these notes available at this time. Was anticoagulated at that point. No anticoagulation currently. Doubt related to current symptomatology but would like to have on file treatment from that time, recommendations.   Plan: EGD with Dr. Jena Gauss in the morning, dilation if needed Likely proceed with Phenergan on call due to polypharmacy Continue Vanc taper PPI Probiotic Outpatient f/u of fatty liver   LOS: 4 days   [name]  08/26/2011, 4:02 PM

## 2011-08-26 NOTE — Progress Notes (Signed)
Subjective: This lady did not have a great day yesterday and tells me that she had vomiting. She still continues to have abdominal pain, although when I walked into the room she was last asleep. I see that she is using intravenous hydromorphone on a regular basis, yesterday she had 5 mg intravenously in total, this is equivalent to oral morphine 100 mg.           Physical Exam: Blood pressure 97/60, pulse 75, temperature 98.2 F (36.8 C), temperature source Oral, resp. rate 20, height 5\' 2"  (1.575 m), weight 82.8 kg (182 lb 8.7 oz), SpO2 97.00%. She looks systemically well but appears to be in some discomfort. Abdomen is soft and mildly tender in a generalized fashion. Bowel sounds are clearly heard and are normal. Lung fields are clear. Heart sounds are present and normal. She is alert and orientated.   Investigations:  Recent Results (from the past 240 hour(s))  CLOSTRIDIUM DIFFICILE BY PCR     Status: Normal   Collection Time   08/23/11  2:50 PM      Component Value Range Status Comment   C difficile by pcr NEGATIVE  NEGATIVE  Final      Basic Metabolic Panel:  Basename 08/26/11 0538 08/25/11 0555  NA 138 140  K 3.9 4.4  CL 100 103  CO2 32 29  GLUCOSE 220* 188*  BUN 5* 5*  CREATININE 0.63 0.63  CALCIUM 9.2 9.2  MG -- --  PHOS -- --   Liver Function Tests:  [facility] 08/26/11 0538 08/25/11 0555  AST 30 36  ALT 34 39*  ALKPHOS 95 71  BILITOT 0.2* 0.2*  PROT 6.5 6.5  ALBUMIN 3.1* 3.1*     CBC:  Basename 08/26/11 0538 08/25/11 0555  WBC 10.5 11.1*  NEUTROABS -- --  HGB 13.4 13.5  HCT 39.7 40.1  MCV 97.5 97.8  PLT 479* PLATELET CLUMPS NOTED ON SMEAR, COUNT APPEARS ADEQUATE    CT ABDOMEN AND PELVIS WITH CONTRAST  Technique: Multidetector CT imaging of the abdomen and pelvis was performed following the standard protocol during bolus administration of intravenous contrast.  Contrast: OMNIPAQUE IOHEXOL 300 MG/ML SOLN  Comparison: Abdominal pelvic  CT 08/09/2011.  Findings: A small right pleural effusion has resolved. The lung bases are clear.  There is no residual or recurrent colonic wall thickening or surrounding inflammatory change. The appendix appears stable. There is no small bowel wall thickening. Moderate stool is present throughout the colon.  Patient is status post splenectomy. 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 A few mildly enlarged mesenteric lymph nodes are unchanged. There is no evidence of acute thrombosis.  The liver, gallbladder, pancreas, adrenal glands and kidneys appear stable. The ovaries appear stable status post partial hysterectomy. There are stable degenerative changes of the sacroiliac joints.  IMPRESSION:  1. Interval resolution of colitis. No evidence of residual or recurrent colitis. 2. No other inflammatory changes demonstrated. The appendix appears unchanged. 3. Stable chronic findings related to cavernous transformation of the portal vein.       Medications:  Scheduled:    . enoxaparin  40 mg Subcutaneous Q24H  . FLUoxetine  20 mg Oral Daily  . insulin aspart  0-15 Units Subcutaneous TID WC  . insulin aspart  0-5 Units Subcutaneous QHS  . magic mouthwash  7.5 mL Oral TID   And  . lidocaine  7.5 mL Mouth/Throat TID  . pantoprazole  40 mg Oral Q1200  .  saccharomyces boulardii  250 mg Oral BID  . senna-docusate  1 tablet Oral BID  . simvastatin  10 mg Oral q1800  . vancomycin  125 mg Oral QID   Followed by  . vancomycin  125 mg Oral BID   Followed by  . vancomycin  125 mg Oral Daily   Followed by  . vancomycin  125 mg Oral QODAY   Followed by  . vancomycin  125 mg Oral Q3 days  . DISCONTD: lisinopril  10 mg Oral Daily    Impression: 1. No evidence of colitis on CT scan. Etiology of abdominal pain is unclear. 2. Hypertension, currently soft blood pressures. 3. Diabetes mellitus type 2. 4.  Constipation now.     Plan: 1. Discontinue IV hydromorphone area to start oral oxycodone. 2. Gastroenterology consultation.     LOS: 4 days   Wilson Singer Pager [phone]  08/26/2011, 8:07 AM

## 2011-08-27 ENCOUNTER — Encounter (HOSPITAL_COMMUNITY): Payer: Self-pay | Admitting: *Deleted

## 2011-08-27 ENCOUNTER — Encounter (HOSPITAL_COMMUNITY)
Admission: EM | Disposition: A | Discharge status: Home / Self Care | Payer: Self-pay | Source: Home / Self Care | Attending: Internal Medicine

## 2011-08-27 DIAGNOSIS — D7289 Other specified disorders of white blood cells: Secondary | ICD-10-CM

## 2011-08-27 DIAGNOSIS — R109 Unspecified abdominal pain: Secondary | ICD-10-CM

## 2011-08-27 DIAGNOSIS — K2960 Other gastritis without bleeding: Secondary | ICD-10-CM

## 2011-08-27 DIAGNOSIS — E860 Dehydration: Secondary | ICD-10-CM

## 2011-08-27 DIAGNOSIS — R1310 Dysphagia, unspecified: Secondary | ICD-10-CM

## 2011-08-27 DIAGNOSIS — K219 Gastro-esophageal reflux disease without esophagitis: Secondary | ICD-10-CM

## 2011-08-27 DIAGNOSIS — R1013 Epigastric pain: Secondary | ICD-10-CM

## 2011-08-27 HISTORY — PX: ESOPHAGOGASTRODUODENOSCOPY: SHX1529

## 2011-08-27 LAB — GLUCOSE, CAPILLARY
Glucose-Capillary: 201 mg/dL — ABNORMAL HIGH (ref 70–99)
Glucose-Capillary: 179 mg/dL — ABNORMAL HIGH (ref 70–99)
Glucose-Capillary: 153 mg/dL — ABNORMAL HIGH (ref 70–99)
Glucose-Capillary: 291 mg/dL — ABNORMAL HIGH (ref 70–99)

## 2011-08-27 LAB — CBC
HCT: 40 % (ref 36.0–46.0)
Hemoglobin: 13.2 g/dL (ref 12.0–15.0)
MCH: 32.1 pg (ref 26.0–34.0)
MCHC: 33 g/dL (ref 30.0–36.0)
MCV: 97.3 fL (ref 78.0–100.0)
Platelets: 490 10*3/uL — ABNORMAL HIGH (ref 150–400)
RBC: 4.11 MIL/uL (ref 3.87–5.11)
RDW: 13.9 % (ref 11.5–15.5)
WBC: 10.5 10*3/uL (ref 4.0–10.5)

## 2011-08-27 LAB — BASIC METABOLIC PANEL
BUN: 6 mg/dL (ref 6–23)
CO2: 30 mEq/L (ref 19–32)
Calcium: 9.3 mg/dL (ref 8.4–10.5)
Chloride: 96 mEq/L (ref 96–112)
Creatinine, Ser: 0.68 mg/dL (ref 0.50–1.10)
GFR calc Af Amer: >90 mL/min (ref >90)
GFR calc non Af Amer: >90 mL/min (ref >90)
Glucose, Bld: 225 mg/dL — ABNORMAL HIGH (ref 70–99)
Potassium: 4.2 mEq/L (ref 3.5–5.1)
Sodium: 137 mEq/L (ref 135–145)

## 2011-08-27 SURGERY — ESOPHAGOGASTRODUODENOSCOPY (EGD) WITH ESOPHAGEAL DILATION
Anesthesia: Moderate Sedation

## 2011-08-27 MED ORDER — DIPHENHYDRAMINE HCL 25 MG PO CAPS
25.0000 mg | ORAL_CAPSULE | Freq: Four times a day (QID) | ORAL | Status: DC | PRN
  Administered 2011-08-27 (×2): 25 mg via ORAL
  Filled 2011-08-27 (×2): qty 1

## 2011-08-27 MED ORDER — SODIUM CHLORIDE 0.45 % IV SOLN
INTRAVENOUS | Status: DC
  Administered 2011-08-27: 1000 mL via INTRAVENOUS

## 2011-08-27 MED ORDER — MIDAZOLAM HCL 5 MG/5ML IJ SOLN
INTRAMUSCULAR | Status: DC | PRN
  Administered 2011-08-27: 2 mg via INTRAVENOUS

## 2011-08-27 MED ORDER — SODIUM CHLORIDE 0.9 % IJ SOLN
INTRAMUSCULAR | Status: AC
  Filled 2011-08-27: qty 3

## 2011-08-27 MED ORDER — MEPERIDINE HCL 100 MG/ML IJ SOLN
INTRAMUSCULAR | Status: AC
  Filled 2011-08-27: qty 1

## 2011-08-27 MED ORDER — MEPERIDINE HCL 100 MG/ML IJ SOLN
INTRAMUSCULAR | Status: DC | PRN
  Administered 2011-08-27: 50 mg via INTRAVENOUS

## 2011-08-27 MED ORDER — PROMETHAZINE HCL 25 MG/ML IJ SOLN
INTRAMUSCULAR | Status: AC
  Administered 2011-08-27: 25 mg via INTRAVENOUS
  Filled 2011-08-27: qty 1

## 2011-08-27 MED ORDER — STERILE WATER FOR IRRIGATION IR SOLN
Status: DC | PRN
  Administered 2011-08-27: 15:00:00

## 2011-08-27 MED ORDER — MIDAZOLAM HCL 5 MG/5ML IJ SOLN
INTRAMUSCULAR | Status: AC
  Filled 2011-08-27: qty 10

## 2011-08-27 MED ORDER — SODIUM CHLORIDE 0.9 % IJ SOLN
INTRAMUSCULAR | Status: AC
  Filled 2011-08-27: qty 10

## 2011-08-27 NOTE — Progress Notes (Signed)
Subjective: This lady continues to have abdominal pain. She has been able to tolerate a diet, fortunately. She is due to have EGD this morning.           Physical Exam: Blood pressure 137/82, pulse 72, temperature 97.8 F (36.6 C), temperature source Oral, resp. rate 20, height 5\' 2"  (1.575 m), weight 82.8 kg (182 lb 8.7 oz), SpO2 95.00%. She looks systemically well but appears to be in some discomfort. Abdomen is soft and mildly tender in a generalized fashion. Bowel sounds are clearly heard and are normal. Lung fields are clear. Heart sounds are present and normal. She is alert and orientated.   Investigations:  Recent Results (from the past 240 hour(s))  CLOSTRIDIUM DIFFICILE BY PCR     Status: Normal   Collection Time   08/23/11  2:50 PM      Component Value Range Status Comment   C difficile by pcr NEGATIVE  NEGATIVE  Final      Basic Metabolic Panel:  Basename 08/27/11 0659 08/26/11 0538  NA 137 138  K 4.2 3.9  CL 96 100  CO2 30 32  GLUCOSE 225* 220*  BUN 6 5*  CREATININE 0.68 0.63  CALCIUM 9.3 9.2  MG -- --  PHOS -- --   Liver Function Tests:  [facility] 08/26/11 0538 08/25/11 0555  AST 30 36  ALT 34 39*  ALKPHOS 95 71  BILITOT 0.2* 0.2*  PROT 6.5 6.5  ALBUMIN 3.1* 3.1*     CBC:  Basename 08/27/11 0659 08/26/11 0538  WBC 10.5 10.5  NEUTROABS -- --  HGB 13.2 13.4  HCT 40.0 39.7  MCV 97.3 97.5  PLT 490* 479*    CT ABDOMEN AND PELVIS WITH CONTRAST  Technique: Multidetector CT imaging of the abdomen and pelvis was performed following the standard protocol during bolus administration of intravenous contrast.  Contrast: OMNIPAQUE IOHEXOL 300 MG/ML SOLN  Comparison: Abdominal pelvic CT 08/09/2011.  Findings: A small right pleural effusion has resolved. The lung bases are clear.  There is no residual or recurrent colonic wall thickening or surrounding inflammatory change. The appendix appears stable. There is no small bowel wall  thickening. Moderate stool is present throughout the colon.  Patient is status post splenectomy. 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 A few mildly enlarged mesenteric lymph nodes are unchanged. There is no evidence of acute thrombosis.  The liver, gallbladder, pancreas, adrenal glands and kidneys appear stable. The ovaries appear stable status post partial hysterectomy. There are stable degenerative changes of the sacroiliac joints.  IMPRESSION:  1. Interval resolution of colitis. No evidence of residual or recurrent colitis. 2. No other inflammatory changes demonstrated. The appendix appears unchanged. 3. Stable chronic findings related to cavernous transformation of the portal vein.       Medications:  Scheduled:    . FLUoxetine  20 mg Oral Daily  . insulin aspart  0-15 Units Subcutaneous TID WC  . insulin aspart  0-5 Units Subcutaneous QHS  . magic mouthwash  7.5 mL Oral TID   And  . lidocaine  7.5 mL Mouth/Throat TID  . pantoprazole  40 mg Oral Q1200  . promethazine  25 mg Intravenous On Call  . saccharomyces boulardii  250 mg Oral BID  . senna-docusate  1 tablet Oral BID  . simvastatin  10 mg Oral q1800  . sodium chloride      . vancomycin  125 mg Oral QID   Followed  by  . vancomycin  125 mg Oral BID   Followed by  . vancomycin  125 mg Oral Daily   Followed by  . vancomycin  125 mg Oral QODAY   Followed by  . vancomycin  125 mg Oral Q3 days  . DISCONTD: enoxaparin  40 mg Subcutaneous Q24H  . DISCONTD: pantoprazole  40 mg Oral Q1200    Impression: 1. No evidence of colitis on CT scan. Etiology of abdominal pain is unclear. 2. Hypertension, currently soft blood pressures. 3. Diabetes mellitus type 2. 4. Constipation now.     Plan: 1. Continue current therapy. 2. EGD today.     LOS: 5 days   Wilson Singer Pager [phone]  08/27/2011, 8:48 AM

## 2011-08-27 NOTE — Progress Notes (Signed)
Pt received from having EGD back to room bed.  Pt lethargic but easily arouse to voice.  Pt without complaints, call bell given.  Will continue to monitor closely.

## 2011-08-27 NOTE — Op Note (Signed)
[facility] [address] [place], [place]  [address]  ENDOSCOPY PROCEDURE REPORT  PATIENT:  [name], [name]  MR#:  [id number] BIRTHDATE:  [birth year], 45 yrs. old  GENDER:  female  ENDOSCOPIST:  R. Roetta Sessions, MD FACP [facility] Referred by:         Hospitalist  PROCEDURE DATE:  08/27/2011 PROCEDURE:  EGD with Elease Hashimoto dilation followed by gastric biopsy  INDICATIONS:  Epigastric pain; esophageal dysphagia; GERD  INFORMED CONSENT:   The risks, benefits, limitations, alternatives and imponderables have been discussed.  The potential for biopsy, esophogeal dilation, etc. have also been reviewed.  Questions have been answered.  All parties agreeable.  Please see the history and physical in the medical record for more information.  MEDICATIONS:      Versed 2 mg IV and Demerol 50 mg IV. Phenergan 25 mg IV to augment conscious sedation. Cetacaine spray.  DESCRIPTION OF PROCEDURE:   The EG-[id number] ([id number]) endoscope was introduced through the mouth and advanced to the second portion of the duodenum without difficulty or limitations.  The mucosal surfaces were surveyed very carefully during advancement of the scope and upon withdrawal.  Retroflexion view of the proximal stomach and esophagogastric junction was performed.  <<PROCEDUREIMAGES>>  FINDINGS:  normal, patent esophagus. Stomach empty. Multiple linear antral erosions. No ulcer or infiltrating process. Small hiatal     hernia. Patent pylorus. Normal first and second portion of the duodenum.  THERAPEUTIC / DIAGNOSTIC MANEUVERS PERFORMED:  A 54 French Maloney dilator was passed to full insertion easily. A look back revealed no apparent complication related to this maneuver. Subsequently, biopsies of the antrum were taken for histologic study. COMPLICATIONS:   None IMPRESSION:  Normal esophagus-status post empiric dilation. Small hiatal hernia. Antral erosions-status post biopsy. Today's findings do                                  not explain patient's abdominal pain which, thus far, is  somewhat out of proportion to objective findings.  RECOMMENDATIONS:  Advance diet as tolerated.  Continue PPI;  Dr. Darrick Penna will reassess 08/28/2011. Followup on pathology.  ______________________________ R. Roetta Sessions, MD Caleen Essex  CC:  n. eSIGNED:   R. Roetta Sessions at 08/27/2011 03:04 PM  Milda Smart, [phone]

## 2011-08-28 DIAGNOSIS — R109 Unspecified abdominal pain: Secondary | ICD-10-CM

## 2011-08-28 DIAGNOSIS — D7289 Other specified disorders of white blood cells: Secondary | ICD-10-CM

## 2011-08-28 DIAGNOSIS — E860 Dehydration: Secondary | ICD-10-CM

## 2011-08-28 LAB — GLUCOSE, CAPILLARY
Glucose-Capillary: 182 mg/dL — ABNORMAL HIGH (ref 70–99)
Glucose-Capillary: 277 mg/dL — ABNORMAL HIGH (ref 70–99)
Glucose-Capillary: 214 mg/dL — ABNORMAL HIGH (ref 70–99)
Glucose-Capillary: 230 mg/dL — ABNORMAL HIGH (ref 70–99)

## 2011-08-28 MED ORDER — DICYCLOMINE HCL 10 MG PO CAPS
10.0000 mg | ORAL_CAPSULE | Freq: Three times a day (TID) | ORAL | Status: DC
  Administered 2011-08-28 – 2011-08-29 (×4): 10 mg via ORAL
  Filled 2011-08-28 (×4): qty 1

## 2011-08-28 MED ORDER — METOCLOPRAMIDE HCL 10 MG PO TABS: 10.0000 mg | ORAL_TABLET | Freq: Four times a day (QID) | ORAL | Status: DC

## 2011-08-28 MED ORDER — SACCHAROMYCES BOULARDII 250 MG PO CAPS: 250.0000 mg | ORAL_CAPSULE | Freq: Two times a day (BID) | ORAL | Status: AC

## 2011-08-28 MED ORDER — ONDANSETRON HCL 4 MG PO TABS
4.0000 mg | ORAL_TABLET | Freq: Four times a day (QID) | ORAL | Status: DC
  Administered 2011-08-28 – 2011-08-29 (×4): 4 mg via ORAL
  Filled 2011-08-28 (×4): qty 1

## 2011-08-28 MED ORDER — OXYCODONE HCL 10 MG PO TABS: 10.0000 mg | ORAL_TABLET | ORAL | Status: DC | PRN

## 2011-08-28 NOTE — Progress Notes (Signed)
Spoke with Dr. Darrick Penna in regards to patients scheduled doses of Bentyl and Zofran.  I needed to know if patients would continue the Bentyl and Zofran as a home medication.  The MD stated that she would continue to need the meds 30 minutes before meals and at bedtime.  The MD also stated at that time that after talking with Dr. Nobie Putnam the patient would be discharged 08/29/11.  I told her I would let the patient know.  I discussed the plan for discharge with the patient and se verbalized understanding, still seeming to be some what agitated.  No further questions from her except for if she was suppose to continue to take the oral Vanc. Which I told her she still had orders, but always have the right to refuse.  Will continue to monitor.

## 2011-08-28 NOTE — Progress Notes (Signed)
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 She needs to use Bentyl & Zofran. Continue full liquid diet. Will speak with Dr. Karilyn Cota about d/c pt 5/11.

## 2011-08-28 NOTE — Progress Notes (Signed)
UR Chart Review Completed  

## 2011-08-28 NOTE — Discharge Summary (Signed)
Physician Discharge Summary  Patient ID: [name] MRN: [id number] DOB/AGE: 1965-06-08 46 y.o.  Admit date: 08/22/2011 Discharge date: 08/28/2011    Discharge Diagnoses:  1. Abdominal pain, unclear etiology. EGD shows gastric antrum erosions. Biopsy taken. 2. Treated C. difficile colitis. 3. Hypertension. 4. Type 2 diabetes mellitus.   Medication List  As of 08/28/2011  8:42 AM   TAKE these medications         bismuth subsalicylate 262 MG/15ML suspension   Commonly known as: PEPTO BISMOL   Take 15 mLs by mouth every 6 (six) hours as needed.      cyclobenzaprine 10 MG tablet   Commonly known as: FLEXERIL   Take 10 mg by mouth 3 (three) times daily as needed.      FLUoxetine 20 MG capsule   Commonly known as: PROZAC   Take 20 mg by mouth daily.      glipiZIDE 10 MG tablet   Commonly known as: GLUCOTROL   Take 10 mg by mouth 2 (two) times daily before a meal.      lisinopril 20 MG tablet   Commonly known as: PRINIVIL,ZESTRIL   Take 20 mg by mouth daily.      metFORMIN 1000 MG tablet   Commonly known as: GLUCOPHAGE   Take 1,000 mg by mouth 2 (two) times daily.      metoCLOPramide 10 MG tablet   Commonly known as: REGLAN   Take 1 tablet (10 mg total) by mouth 4 (four) times daily.      Oxycodone HCl 10 MG Tabs   Take 1 tablet (10 mg total) by mouth every 4 (four) hours as needed.      pantoprazole 40 MG tablet   Commonly known as: PROTONIX   Take 40 mg by mouth daily.      pravastatin 20 MG tablet   Commonly known as: PRAVACHOL   Take 20 mg by mouth daily.      saccharomyces boulardii 250 MG capsule   Commonly known as: FLORASTOR   Take 1 capsule (250 mg total) by mouth 2 (two) times daily.      saxagliptin HCl 5 MG Tabs tablet   Commonly known as: ONGLYZA   Take 5 mg by mouth daily.      sennosides-docusate sodium 8.6-50 MG tablet   Commonly known as: SENOKOT-S   Take 1 tablet by mouth daily.            Discharged Condition:  Stable.    Consults: Gastroenterology, Dr. Kendell Bane.  Significant Diagnostic Studies: Ct Abdomen Pelvis Wo Contrast  08/09/2011  *RADIOLOGY REPORT*  Clinical Data: Persistent abdominal pain.  History of C difficile colitis.  CT ABDOMEN AND PELVIS WITHOUT CONTRAST  Technique:  Multidetector CT imaging of the abdomen and pelvis was performed following the standard protocol without intravenous contrast.  Comparison: 08/05/2011  Findings: Clear lung bases.  Normal heart size.  Trace bilateral pleural effusions are new.  Mild hepatic steatosis.  Hepatomegaly, greater than 21 cm cranial caudal.  Status post splenectomy.  Normal stomach, pancreas, gallbladder, biliary tract, adrenal glands, kidneys.  Prominent retroperitoneal nodes are unchanged. Not pathologic by size criteria.  Cavernous transformation of the portal vein is more apparent on recent contrast enhanced exam.  Improvement in colitis.  There is residual wall thickening involving the proximal transverse and hepatic flexure colon on image 34. Normal terminal ileum and appendix.  No pneumatosis or free intraperitoneal air.  Normal small bowel without abdominal ascites.  Mesenteric collaterals are  secondary to a cavernous transformation of the portal vein. No ascites.  No pelvic adenopathy.  Normal urinary bladder.  Hysterectomy. Normal ovaries/adnexa for age. No significant free fluid.  Likely degenerative sclerosis of the bilateral sacroiliac joints. No acute osseous abnormality.  IMPRESSION:  1.  Improvement in colitis.  Hepatic flexure and proximal transverse colonic wall thickening remains. 2.  No new explanation for abdominal pain. 3.  Hepatic steatosis and hepatomegaly.  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 4.  New trace bilateral pleural effusions.  Original Report Authenticated By: Consuello Bossier, M.D.   Ct Abdomen Pelvis W Contrast  08/25/2011  *RADIOLOGY REPORT*  Clinical Data: Abdominal cramping for 3 days.  History  of colitis, diabetes, hypertension, splenectomy and hysterectomy.  Question recurrent colitis.  CT ABDOMEN AND PELVIS WITH CONTRAST  Technique:  Multidetector CT imaging of the abdomen and pelvis was performed following the standard protocol during bolus administration of intravenous contrast.  Contrast: OMNIPAQUE IOHEXOL 300 MG/ML  SOLN  Comparison: Abdominal pelvic CT 08/09/2011.  Findings: A small right pleural effusion has resolved.  The lung bases are clear.  There is no residual or recurrent colonic wall thickening or surrounding inflammatory change.  The appendix appears stable. There is no small bowel wall thickening.  Moderate stool is present throughout the colon.  Patient is status post splenectomy.  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  A few mildly enlarged mesenteric lymph nodes are unchanged.  There is no evidence of acute thrombosis.  The liver, gallbladder, pancreas, adrenal glands and kidneys appear stable.  The ovaries appear stable status post partial hysterectomy.  There are stable degenerative changes of the sacroiliac joints.  IMPRESSION:  1.  Interval resolution of colitis.  No evidence of residual or recurrent colitis. 2.  No other inflammatory changes demonstrated.  The appendix appears unchanged. 3.  Stable chronic findings related to cavernous transformation of the portal vein.  Original Report Authenticated By: Gerrianne Scale, M.D.   Ct Abdomen Pelvis W Contrast  08/06/2011  *RADIOLOGY REPORT*  Clinical Data: Abdominal pain cramping, nausea and vomiting. History splenectomy hysterectomy.  CT ABDOMEN AND PELVIS WITH CONTRAST  Technique:  Multidetector CT imaging of the abdomen and pelvis was performed following the standard protocol during bolus administration of intravenous contrast.  Contrast:  100 ml  Comparison: .  CT abdomen pelvis 01/11/2009  Findings:  The lung bases are clear.  There is fatty  infiltration of the liver.  No focal hepatic mass. Patient is status post splenectomy.  There is cavernous transformation of the portal vein, a chronic finding, unchanged compared to prior CT examinations.  There is no biliary ductal dilatation.  The pancreas, adrenal glands, and kidneys are within normal limits.  The appendix is normal.  There is circumferential wall thickening of the colon extending from the cecum through the distal sigmoid colon consistent with pancolitis.  There is injection of the vasculature surrounding the colon.  There is slight stranding in the pericolonic mesenteries.  There is no evidence of bowel obstruction.  Small bowel loops appear to have normal wall thickness.  Abdominal aorta is normal in caliber.  Urinary bladder is within normal limits.  Patient is status post hysterectomy.  There is no free intraperitoneal air or evidence of abscess.  IMPRESSION:  1.  Pancolitis.  The wall of the entire colon is circumferentially thickened and there is prominence of the pericolonic vasculature and slight pericolonic fat stranding.  Findings could be due to infectious colitis or inflammatory bowel disease.  Consider testing to exclude C difficile colitis. 2.  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 3.  Fatty infiltration of the liver.  Original Report Authenticated By: Britta Mccreedy, M.D.   Dg Abd Acute W/chest  08/22/2011  *RADIOLOGY REPORT*  Clinical Data: Several week history of persistent mid abdominal pain.  Patient recently treated for Clostridium difficile. 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  ACUTE ABDOMEN SERIES (ABDOMEN 2 VIEW & CHEST 1 VIEW) 08/22/2011:  Comparison: CT abdomen and pelvis 08/09/2011, 08/05/2011, 01/11/2009, 08/28/2008, 02/02/2008, 08/21/2007, 08/15/2007, 07/14/2007, 07/06/2007, 05/31/2007.  Findings: Bowel gas pattern unremarkable without evidence of  obstruction or significant ileus.  No evidence of free air or significant air fluid levels on the erect image.  Surgical clips in the left upper quadrant from prior splenectomy.  Phleboliths in both sides of the pelvis.  No visible opaque urinary tract calculi. Psoas margins visible.  Regional skeleton intact.  Cardiomediastinal silhouette unremarkable.   Lungs clear. Bronchovascular markings normal.  Pulmonary vascularity normal.  No pneumothorax.  No pleural effusions.  IMPRESSION: No acute abdominal or pulmonary abnormality.  Original Report Authenticated By: Arnell Sieving, M.D.    Lab Results: Basic Metabolic Panel:  Basename 08/27/11 0659 08/26/11 0538  NA 137 138  K 4.2 3.9  CL 96 100  CO2 30 32  GLUCOSE 225* 220*  BUN 6 5*  CREATININE 0.68 0.63  CALCIUM 9.3 9.2  MG -- --  PHOS -- --   Liver Function Tests:  [facility] 08/26/11 0538  AST 30  ALT 34  ALKPHOS 95  BILITOT 0.2*  PROT 6.5  ALBUMIN 3.1*     CBC:  Basename 08/27/11 0659 08/26/11 0538  WBC 10.5 10.5  NEUTROABS -- --  HGB 13.2 13.4  HCT 40.0 39.7  MCV 97.3 97.5  PLT 490* 479*    Recent Results (from the past 240 hour(s))  CLOSTRIDIUM DIFFICILE BY PCR     Status: Normal   Collection Time   08/23/11  2:50 PM      Component Value Range Status Comment   C difficile by pcr NEGATIVE  NEGATIVE  Final      Hospital Course: This 46 year old lady was admitted with symptoms of abdominal pain, vomiting and multiple loose stools. She also felt feverish. Her daughter had had similar symptoms. The patient had recently been hospitalized with C. difficile colitis and the initial evaluation and assessment pointed towards untreated C. difficile colitis. She was therefore started on a longer taper vancomycin. Her symptoms somewhat improved but she continued to have epigastric and abdominal pain. A repeat CT scan of her abdomen revealed the C. difficile colitis had completely resolved. She was therefore seen by  gastroenterology, Dr. Kendell Bane, who performed EGD. The EGD did show gastric antrum erosions and biopsies have been taken. She had dilation of the esophagus as she had been complaining of some dysphagia. She is able to tolerate a diet but she is rather frustrated that no clear diagnosis has been established for this admission. I've told her that it may well be to do with her gastric erosions and possibly viral gastroenteritis that is taking time to heal her gastrointestinal system. She has been tolerating a diet without any further vomiting.  Discharge Exam: Blood pressure 113/72, pulse 76, temperature 98.3 F (36.8 C), temperature source Oral, resp. rate 16, height 5\' 2"  (1.575 m), weight 82.8 kg (  182 lb 8.7 oz), SpO2 92.00%. She does look systemically well although she is tearful and frustrated. Abdomen is soft and mildly tender. Heart sounds are present and normal. Lung fields are clear. She is not toxic or septic. She is alert and orientated.  Disposition: Home. She has been given a prescription for metoclopramide to see if this will help her symptoms. She will follow with Dr. Kendell Bane in the next couple weeks or so.  Discharge Orders    Future Orders Please Complete By Expires   Diet - low sodium heart healthy      Increase activity slowly           SignedWilson Singer Pager [phone]  08/28/2011, 8:42 AM

## 2011-08-28 NOTE — Progress Notes (Signed)
Subjective:  Patient very upset with pending discharge. Mother on the phone, discussed her concerns as well. Patient reports no improvement with abdominal pain since admission. Diarrhea resolved. Unable to eat significant amounts of food. Currently on full liquids. C/O diffuse abdominal pain but currently more epigastric. Sharp pains.   Objective: Vital signs in last 24 hours: Temp:  [98.2 F (36.8 C)-98.5 F (36.9 C)] 98.3 F (36.8 C) (05/10 0410) Pulse Rate:  [76-108] 76  (05/10 0410) Resp:  [14-20] 16  (05/10 0410) BP: (92-128)/(57-83) 113/72 mmHg (05/10 0410) SpO2:  [90 %-95 %] 92 % (05/10 0410) Last BM Date: 08/26/11 General:   Alert,  Well-developed, well-nourished, pleasant and cooperative in NAD. Very tearful.  Head:  Normocephalic and atraumatic. Eyes:  Sclera clear, no icterus.  Abdomen:  Soft, moderate LUQ/epigastric tenderness. Nondistended. No masses, hepatomegaly or hernias noted. Normal bowel sounds, without guarding, and without rebound.   Extremities:  Without clubbing, deformity or edema. Neurologic:  Alert and  oriented x4;  grossly normal neurologically. Skin:  Intact without significant lesions or rashes. Psych:  Alert and cooperative. Very tearful.  Intake/Output from previous day: 05/09 0701 - 05/10 0700 In: 480 [P.O.:480] Out: 1400 [Urine:1400] Intake/Output this shift:    Lab Results: CBC  Basename 08/27/11 0659 08/26/11 0538  WBC 10.5 10.5  HGB 13.2 13.4  HCT 40.0 39.7  MCV 97.3 97.5  PLT 490* 479*   BMET  Basename 08/27/11 0659 08/26/11 0538  NA 137 138  K 4.2 3.9  CL 96 100  CO2 30 32  GLUCOSE 225* 220*  BUN 6 5*  CREATININE 0.68 0.63  CALCIUM 9.3 9.2   LFTs  Basename 08/26/11 0538  BILITOT 0.2*  BILIDIR --  IBILI --  ALKPHOS 95  AST 30  ALT 34  PROT 6.5  ALBUMIN 3.1*     Assessment: 46 y/o female with recent hx of CDiff colitis with documented negative PCR this admission and resolution of colitis on last CT. Continues with  ongoing diffuse abdominal pain which currently is unexplained. EGD findings NOT likely source of symptoms. Patient was pain free prior to 07/2011. Patient with history of [place] Histiocytosis s/p splenectomy in 2009. Subsequent splenic vein, superior mesenteric vein, portal vein thrombosis. Current CT shows no acute thrombosis but shows known chronic cavernous transformation of portal vein with multiple collateral mesenteric vessels.   Plan: 1. Patient's d/c pending. She is visibly upset and frustrated. I spoke with mother as well who requests phone call from attending or Dr. Darrick Penna. I will discuss findings with Dr. Darrick Penna to determine additional recommendations regarding patient's ongoing unexplained abdominal pain.    LOS: 6 days   Tana Coast  08/28/2011, 8:06 AM   Discussed with Dr. Darrick Penna, suggest addition of antispasmotic/antiemetic for possible post-infectious IBS. She will come to evaluate patient.

## 2011-08-29 DIAGNOSIS — D7289 Other specified disorders of white blood cells: Secondary | ICD-10-CM

## 2011-08-29 DIAGNOSIS — R109 Unspecified abdominal pain: Secondary | ICD-10-CM

## 2011-08-29 DIAGNOSIS — R197 Diarrhea, unspecified: Secondary | ICD-10-CM

## 2011-08-29 DIAGNOSIS — E860 Dehydration: Secondary | ICD-10-CM

## 2011-08-29 DIAGNOSIS — B370 Candidal stomatitis: Secondary | ICD-10-CM

## 2011-08-29 LAB — CREATININE, SERUM
Creatinine, Ser: 0.67 mg/dL (ref 0.50–1.10)
GFR calc Af Amer: >90 mL/min (ref >90)
GFR calc non Af Amer: >90 mL/min (ref >90)

## 2011-08-29 LAB — GLUCOSE, CAPILLARY: Glucose-Capillary: 197 mg/dL — ABNORMAL HIGH (ref 70–99)

## 2011-08-29 MED ORDER — SENNA-DOCUSATE SODIUM 8.6-50 MG PO TABS: 1.0000 | ORAL_TABLET | Freq: Every day | ORAL | Status: DC | PRN

## 2011-08-29 MED ORDER — OXYCODONE HCL 10 MG PO TABS: 10.0000 mg | ORAL_TABLET | ORAL | Status: AC | PRN

## 2011-08-29 MED ORDER — ONDANSETRON HCL 4 MG PO TABS: 4.0000 mg | ORAL_TABLET | Freq: Four times a day (QID) | ORAL | Status: AC

## 2011-08-29 MED ORDER — MAGIC MOUTHWASH: 10.0000 mL | Freq: Three times a day (TID) | ORAL | Status: DC

## 2011-08-29 MED ORDER — VANCOMYCIN 50 MG/ML ORAL SOLUTION: ORAL | Status: DC

## 2011-08-29 MED ORDER — DICYCLOMINE HCL 10 MG PO CAPS: 10.0000 mg | ORAL_CAPSULE | Freq: Three times a day (TID) | ORAL | Status: DC

## 2011-08-29 NOTE — Progress Notes (Signed)
Subjective: Since I last evaluated the patient  ONE NL STOOL. NO VOMITIN. FEELING FULL AFTER FLD BREAKFAST. PAIN SAME AS YESTERDAY: RU/RLQ.  Objective: Vital signs in last 24 hours: Temp:  [98 F (36.7 C)-98.5 F (36.9 C)] 98 F (36.7 C) (05/11 0528) Pulse Rate:  [68-92] 68  (05/11 0528) Resp:  [16] 16  (05/11 0528) BP: (98-102)/(61-66) 100/66 mmHg (05/11 0528) SpO2:  [90 %-94 %] 90 % (05/11 0528) Last BM Date: 08/26/11  Intake/Output from previous day: 05/10 0701 - 05/11 0700 In: 720 [P.O.:720] Out: -  Intake/Output this shift:    General appearance: alert, cooperative and no distress Resp: clear to auscultation bilaterally Cardio: regular rate and rhythm GI: soft, MILD TTP IN RUQ/RLQ W/O REBOUND OR GUARDING; bowel sounds normal; no masses  Lab Results:  [facility] 08/27/11 0659  WBC 10.5  HGB 13.2  HCT 40.0  PLT 490*   BMET  Basename 08/29/11 0607 08/27/11 0659  NA -- 137  K -- 4.2  CL -- 96  CO2 -- 30  GLUCOSE -- 225*  BUN -- 6  CREATININE 0.67 0.68  CALCIUM -- 9.3   LFT No results found for this basename: PROT,ALBUMIN,AST,ALT,ALKPHOS,BILITOT,BILIDIR,IBILI in the last 72 hours PT/INR No results found for this basename: LABPROT:2,INR:2 in the last 72 hours Hepatitis Panel No results found for this basename: HEPBSAG,HCVAB,HEPAIGM,HEPBIGM in the last 72 hours C-Diff No results found for this basename: CDIFFTOX:3 in the last 72 hours Fecal Lactopherrin No results found for this basename: FECLLACTOFRN in the last 72 hours  Studies/Results: No results found.  Medications: I have reviewed the patient's current medications.  Assessment/Plan: 46 YO FEMALE ADMITTED WITH ABDOMINAL PAINA DN DIARRHEA. PMHX: CDIFF APR 2013-CDIFF PCR NEG MAY 2013. CLINICALLY IMPROVED ON PO VANC.  PLAN: 1. ZOFRAN 30 MINUTES PRIOR TO MEALS TID AND AT BEDTIME FOR THE NEXT 5 DAYS. COMPLETE VANC TAPER. 2. DICYCLOMINE 10 MG 30 MINUTES PRIOR TO MEALS AND AT BEDTIME. ADD PHILLIPS COLON  HEALTH DAILY FOR THE NEXT 6 MOS. 3. OK TO D/C HOME. PT CONCERNED ABOUT PAIN CONTROL. 4. CONTINUE FULL LIQUID DIET. ADVANCE DIET TO SOFT MECH LOW FAT DIET ON MAY 14. 5. FOLLOW UP WITH DR. Jena Gauss IN 1 WEEK.    .  LOS: 7 days   Ory Elting 08/29/2011, 8:10 AM

## 2011-08-29 NOTE — Progress Notes (Signed)
Please refer to discharge summary by Dr. Karilyn Cota on 08/28/11 for details on hospital course.  Patient feels improved since I saw her on admission.  She has not had any diarrhea and no vomiting.  Tolerating liquids.  No fever and labs are improved.  She is ready to go home.  Blood pressure 100/66, pulse 68, temperature 98 F (36.7 C), temperature source Oral, resp. rate 16, height 5\' 2"  (1.575 m), weight 82.8 kg (182 lb 8.7 oz), SpO2 90.00%. NAD CTA B S1, S2, RRR Soft, mild TTP in RUQ, BS+ No edema b/l  Discharge meds:  [place], [place]  Home Medication Instructions RUE:[id number]   Printed on:08/29/11 0936  Medication Information                    pravastatin (PRAVACHOL) 20 MG tablet Take 20 mg by mouth daily.           glipiZIDE (GLUCOTROL) 10 MG tablet Take 10 mg by mouth 2 (two) times daily before a meal.           FLUoxetine (PROZAC) 20 MG capsule Take 20 mg by mouth daily.           metFORMIN (GLUCOPHAGE) 1000 MG tablet Take 1,000 mg by mouth 2 (two) times daily.           saxagliptin HCl (ONGLYZA) 5 MG TABS tablet Take 5 mg by mouth daily.           cyclobenzaprine (FLEXERIL) 10 MG tablet Take 10 mg by mouth 3 (three) times daily as needed.           saccharomyces boulardii (FLORASTOR) 250 MG capsule Take 1 capsule (250 mg total) by mouth 2 (two) times daily.           dicyclomine (BENTYL) 10 MG capsule Take 1 capsule (10 mg total) by mouth 4 (four) times daily -  before meals and at bedtime.           Alum & Mag Hydroxide-Simeth (MAGIC MOUTHWASH) SOLN Take 10 mLs by mouth 3 (three) times daily.           ondansetron (ZOFRAN) 4 MG tablet Take 1 tablet (4 mg total) by mouth 4 (four) times daily.           vancomycin (VANCOCIN) 50 mg/mL oral solution Take 125mg  po bid for 7 days then 125mg  po daily for 7 days, then 125mg  po every other day for 7 days, then 125mg  po every 3 days for 14 days then stop           sennosides-docusate sodium (SENOKOT-S) 8.6-50 MG  tablet Take 1 tablet by mouth daily as needed for constipation.           Oxycodone HCl 10 MG TABS Take 1 tablet (10 mg total) by mouth every 4 (four) hours as needed.              Discharge Diagnosis list is unchanged from D/c summary.

## 2011-08-29 NOTE — Discharge Instructions (Signed)
Clostridium Difficile Infection  Clostridium difficile (C. diff) is a bacteria found in the intestinal tract or colon. Under certain conditions, it causes diarrhea and sometimes severe disease. The severe form of the disease is known as pseudomembranous colitis (often called C. diff colitis). This disease can damage the lining of the colon or cause the colon to become enlarged (toxic megacolon).   CAUSES   Your colon normally contains many different bacteria, including C. diff. The balance of bacteria in your colon can change during illness. This is especially true when you take antibiotic medicine. Taking antibiotics may allow the C. diff to grow, multiply excessively, and make a toxin that then causes illness. The elderly and people with certain medical conditions have a greater risk of getting C. diff infections.  SYMPTOMS    Watery diarrhea.   Fever.   Fatigue.   Loss of appetite.   Nausea.   Abdominal swelling, pain, or tenderness.   Dehydration.  DIAGNOSIS   Your symptoms may make your caregiver suspicious of a C. diff infection, especially if you have used antibiotics in the preceding weeks. However, there are only 2 ways to know for certain whether you have a C. diff infection:   A lab test that finds the toxin in your stool.   The specific appearance of an abnormality (pseudomembrane) in your colon. This can only be seen by doing a sigmoidoscopy or colonoscopy. These procedures involve passing an instrument through your rectum to look at the inside of your colon.  Your caregiver will help determine if these tests are necessary.  TREATMENT    Most people are successfully treated with 1 of 2 specific antibiotics, usually given by mouth. Other antibiotics you are receiving are stopped if possible.   Intravenous (IV) fluids and correction of electrolyte imbalance may be necessary.   Rarely, surgery may be needed to remove the infected part of the intestines.   Careful hand washing by you and your  caregivers is important to prevent the spread of infection. In the hospital, your caregivers may also put on gowns and gloves to prevent the spread of the C. diff bacteria. Your room is also cleaned regularly with a hospital grade disinfectant.  HOME CARE INSTRUCTIONS   Drink enough fluids to keep your urine clear or pale yellow. Avoid milk, caffeine, and alcohol.   Ask your caregiver for specific rehydration instructions.   Try eating small, frequent meals rather than large meals.   Take your antibiotics as directed. Finish them even if you start to feel better.   Do not use medicines to slow diarrhea. This could delay healing or cause complications.   Wash your hands thoroughly after using the bathroom and before preparing food.   Make sure people who live with you wash their hands often, too.  SEEK MEDICAL CARE IF:   Diarrhea persists longer than expected or recurs after completing your course of antibiotic treatment for the C. diff infection.   You have trouble staying hydrated.  SEEK IMMEDIATE MEDICAL CARE IF:   You develop a new fever.   You have increasing abdominal pain or tenderness.   There is blood in your stools, or your stools are dark black and tarry.   You cannot hold down food or liquids.  MAKE SURE YOU:    Understand these instructions.   Will watch your condition.   Will get help right away if you are not doing well or get worse.  Document Released: 01/14/2005 Document Revised: 03/26/2011 Document 

## 2011-09-01 ENCOUNTER — Encounter: Payer: Self-pay | Admitting: Internal Medicine

## 2011-09-09 ENCOUNTER — Encounter: Payer: Self-pay | Admitting: Internal Medicine

## 2011-09-10 ENCOUNTER — Ambulatory Visit: Payer: BC Managed Care – PPO | Admitting: Urgent Care

## 2011-09-15 ENCOUNTER — Ambulatory Visit (INDEPENDENT_AMBULATORY_CARE_PROVIDER_SITE_OTHER): Payer: BC Managed Care – PPO | Admitting: Urgent Care

## 2011-09-15 ENCOUNTER — Encounter: Payer: Self-pay | Admitting: Urgent Care

## 2011-09-15 VITALS — BP 121/70 | HR 73 | Temp 97.9°F | Ht 62.0 in | Wt 183.4 lb

## 2011-09-15 DIAGNOSIS — R1013 Epigastric pain: Secondary | ICD-10-CM

## 2011-09-15 DIAGNOSIS — R140 Abdominal distension (gaseous): Secondary | ICD-10-CM

## 2011-09-15 DIAGNOSIS — R141 Gas pain: Secondary | ICD-10-CM

## 2011-09-15 DIAGNOSIS — R112 Nausea with vomiting, unspecified: Secondary | ICD-10-CM

## 2011-09-15 DIAGNOSIS — K219 Gastro-esophageal reflux disease without esophagitis: Secondary | ICD-10-CM

## 2011-09-15 DIAGNOSIS — R143 Flatulence: Secondary | ICD-10-CM

## 2011-09-15 MED ORDER — OMEPRAZOLE 20 MG PO CPDR: 20.0000 mg | DELAYED_RELEASE_CAPSULE | Freq: Every day | ORAL | Status: DC

## 2011-09-15 MED ORDER — SIMETHICONE 125 MG PO CHEW: 125.0000 mg | CHEWABLE_TABLET | Freq: Two times a day (BID) | ORAL | Status: DC | PRN

## 2011-09-15 NOTE — Patient Instructions (Addendum)
ALIGN daily for probiotics Begin omeprazole 20mg  daily for acid reflux Hold bentyl for now to see if it makes a difference w/ nausea & swallowing Simethicone as needed for bloating twice per day If no better, then call me back by the end of the week

## 2011-09-15 NOTE — Assessment & Plan Note (Signed)
see nausea and vomiting

## 2011-09-15 NOTE — Assessment & Plan Note (Signed)
[name] is a pleasant 46 y.o. female with history of C. difficile colitis. Recent EGD benign.  She has persistent postprandial nausea and vomiting. I question post infectious gastroparesis.  She is also on antispasmotic and Flexeril which could be contributing to her symptoms. She is not on PPI and will start to squelch any acid that may be contributing.  ALIGN daily for probiotics Begin omeprazole 20mg  daily for acid reflux Hold bentyl for now to see if it makes a difference w/ nausea & swallowing Simethicone as needed for bloating twice per day If no better, then call me back by the end of the week

## 2011-09-15 NOTE — Progress Notes (Signed)
Referring Provider: Theodora Blow, FNP Primary Care Physician:  Theodora Blow, FNP, FNP Primary Gastroenterologist:  Dr. Jena Gauss  Chief Complaint  Patient presents with  . Abdominal Pain  . Bloated    HPI:  [name] is a 46 y.o. female here for follow up for abdominal pain and diarrhea. She has history of C. difficile with a positive PCR earlier this year. She was initially treated with Flagyl, followed by vancomycin. She was readmitted to the hospital earlier this month with similar symptoms and was treated empirically with vancomycin despite negative C. Difficile pcr.  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 She has been doing okay at home, however she has more bad days than good. Some days she describes as "pretty good".  Every time she eats, she feels as though her food stops in her chest and upper esophagus. This is then followed by bloating. She has post-prandial nausea and vomiting. She is vomiting several times per day after eating. She is eating a lot of Jell-O, putting and broth because she can keep them down. Her blood sugars remain a bit high. She reports a 10 to 15 pound unintentional weight loss in the last several months. She denies any fever or chills. She is having one to 2 soft formed brown bowel movements daily and denies any rectal bleeding or melena. She ran out of probiotics yesterday, but had been taking them on a regular basis.  She takes an occasional TUMS for indigestion.  Not on a PPI.  Bentyl w/ meals & bedtime since discharge from the hospital.  Inpatient EGD as below. Past Medical History  Diagnosis Date  . Diabetes mellitus   . Hypertension   . High cholesterol   . Clostridium difficile colitis   . Sea-blue histiocyte syndrome     Past Surgical History  Procedure Date  . Splenectomy   . Tonsillectomy   . Abdominal hysterectomy   . Esophagogastroduodenoscopy 08/27/2011    Rourk-normal, patent esophagus. Stomach empty. Multiple  linear antral erosions. No ulcer or infiltrating process. Small hiatal hernia. Patent pylorus. Normal first and second portion of the duodenum    Current Outpatient Prescriptions  Medication Sig Dispense Refill  . cyclobenzaprine (FLEXERIL) 10 MG tablet Take 10 mg by mouth 3 (three) times daily as needed.      . dicyclomine (BENTYL) 10 MG capsule Take 1 capsule (10 mg total) by mouth 4 (four) times daily -  before meals and at bedtime.  120 capsule  1  . FLUoxetine (PROZAC) 20 MG capsule Take 20 mg by mouth daily.      Marland Kitchen glipiZIDE (GLUCOTROL) 10 MG tablet Take 10 mg by mouth 2 (two) times daily before a meal.      . metFORMIN (GLUCOPHAGE) 1000 MG tablet Take 1,000 mg by mouth 2 (two) times daily.      . pravastatin (PRAVACHOL) 20 MG tablet Take 20 mg by mouth daily.      . saxagliptin HCl (ONGLYZA) 5 MG TABS tablet Take 5 mg by mouth daily.      . sennosides-docusate sodium (SENOKOT-S) 8.6-50 MG tablet Take 1 tablet by mouth daily as needed for constipation.  30 tablet  0  . omeprazole (PRILOSEC) 20 MG capsule Take 1 capsule (20 mg total) by mouth daily.  31 capsule  2  . simethicone (MYLICON) 125 MG chewable tablet Chew 1 tablet (125 mg total) by mouth 2 (two) times daily as needed for flatulence.  62 tablet  0    Allergies as of 09/15/2011 - Review Complete 09/15/2011  Allergen Reaction Noted  . Aspartame and phenylalanine Shortness Of Breath and Swelling 08/05/2011  . Other Swelling 08/09/2011  . Sucralose Rash 08/09/2011    Review of Systems: Gen: Denies any fever, chills, sweats, anorexia, fatigue, weakness, malaise, weight loss, and sleep disorder CV: Denies chest pain, angina, palpitations, syncope, orthopnea, PND, peripheral edema, and claudication. Resp: Denies dyspnea at rest, dyspnea with exercise, cough, sputum, wheezing, coughing up blood, and pleurisy. GI: Denies vomiting blood, jaundice, and fecal incontinence.  Derm: Denies rash, itching, dry skin, hives, moles, warts, or  unhealing ulcers.  Psych: Denies depression, anxiety, memory loss, suicidal ideation, hallucinations, paranoia, and confusion. Heme: Denies bruising, bleeding, and enlarged lymph nodes.  Physical Exam: BP 121/70  Pulse 73  Temp(Src) 97.9 F (36.6 C) (Temporal)  Ht 5\' 2"  (1.575 m)  Wt 183 lb 6.4 oz (83.19 kg)  BMI 33.54 kg/m2 General:   Alert,  Well-developed, well-nourished, pleasant and cooperative in NAD Eyes:  Sclera clear, no icterus.   Conjunctiva pink. Mouth:  No deformity or lesions, oropharynx pink and moist. Neck:  Supple; no masses or thyromegaly. Heart:  Regular rate and rhythm; no murmurs, clicks, rubs,  or gallops. Abdomen:  Normal bowel sounds.  No bruits.  Soft, non-tender and non-distended without masses, hepatosplenomegaly or hernias noted.  No guarding or rebound tenderness.   Rectal:  Deferred. Msk:  Symmetrical without gross deformities.  Pulses:  Normal pulses noted. Extremities:  No clubbing or edema. Neurologic:  Alert and oriented x4;  grossly normal neurologically. Skin:  Intact without significant lesions or rashes.

## 2011-09-15 NOTE — Assessment & Plan Note (Signed)
Continued pain despite treatment for C. difficile colitis. No associated change in bowel habits at this time. Most of her symptoms are upper abdomen/epigastric.   See nausea and vomiting.

## 2011-09-16 NOTE — Progress Notes (Signed)
Faxed to PCP

## 2011-09-18 ENCOUNTER — Telehealth: Payer: Self-pay | Admitting: Internal Medicine

## 2011-09-18 MED ORDER — ONDANSETRON HCL 4 MG PO TABS: 4.0000 mg | ORAL_TABLET | Freq: Three times a day (TID) | ORAL | Status: DC | PRN

## 2011-09-18 NOTE — Telephone Encounter (Signed)
She needs gastric emptying study off of Flexeril and Bentyl Continue PPI Zofran as needed

## 2011-09-18 NOTE — Telephone Encounter (Signed)
Pt aware.  Crystal please schedule.

## 2011-09-18 NOTE — Telephone Encounter (Signed)
Pt seen KJ earlier this week and was calling to let her know that meds were working, but she still feel nauseated. [phone] or [phone]

## 2011-09-21 ENCOUNTER — Other Ambulatory Visit: Payer: Self-pay | Admitting: Gastroenterology

## 2011-09-21 DIAGNOSIS — R110 Nausea: Secondary | ICD-10-CM

## 2011-09-21 NOTE — Telephone Encounter (Signed)
Scheduled for GES on 09/23/11 at 8:00 am pt is aware

## 2011-09-23 ENCOUNTER — Encounter (HOSPITAL_COMMUNITY): Payer: Self-pay

## 2011-09-23 ENCOUNTER — Encounter (HOSPITAL_COMMUNITY)
Admission: RE | Admit: 2011-09-23 | Discharge: 2011-09-23 | Disposition: A | Discharge status: Home / Self Care | Payer: BC Managed Care – PPO | Source: Ambulatory Visit | Attending: Urgent Care | Admitting: Urgent Care

## 2011-09-23 ENCOUNTER — Telehealth: Payer: Self-pay | Admitting: Urgent Care

## 2011-09-23 DIAGNOSIS — R112 Nausea with vomiting, unspecified: Secondary | ICD-10-CM | POA: Insufficient documentation

## 2011-09-23 DIAGNOSIS — R142 Eructation: Secondary | ICD-10-CM | POA: Insufficient documentation

## 2011-09-23 DIAGNOSIS — R109 Unspecified abdominal pain: Secondary | ICD-10-CM | POA: Insufficient documentation

## 2011-09-23 DIAGNOSIS — E7800 Pure hypercholesterolemia, unspecified: Secondary | ICD-10-CM | POA: Insufficient documentation

## 2011-09-23 DIAGNOSIS — E119 Type 2 diabetes mellitus without complications: Secondary | ICD-10-CM | POA: Insufficient documentation

## 2011-09-23 DIAGNOSIS — R110 Nausea: Secondary | ICD-10-CM

## 2011-09-23 DIAGNOSIS — K219 Gastro-esophageal reflux disease without esophagitis: Secondary | ICD-10-CM | POA: Insufficient documentation

## 2011-09-23 DIAGNOSIS — R6881 Early satiety: Secondary | ICD-10-CM | POA: Insufficient documentation

## 2011-09-23 DIAGNOSIS — R141 Gas pain: Secondary | ICD-10-CM | POA: Insufficient documentation

## 2011-09-23 MED ORDER — TECHNETIUM TC 99M SULFUR COLLOID
2.0000 | Freq: Once | INTRAVENOUS | Status: AC | PRN
  Administered 2011-09-23: 2.1 via ORAL

## 2011-09-23 NOTE — Progress Notes (Signed)
Faxed to PCP

## 2011-09-23 NOTE — Telephone Encounter (Signed)
Patient is aware and ov is made for 07/09

## 2011-09-23 NOTE — Telephone Encounter (Signed)
LMOM for return call Please let pt know her stomach emptying time is normal. Needs OV 4-6 weeks for FU nausea

## 2011-10-27 ENCOUNTER — Ambulatory Visit: Payer: BC Managed Care – PPO | Admitting: Urgent Care

## 2011-11-12 ENCOUNTER — Encounter: Payer: Self-pay | Admitting: Urgent Care

## 2011-11-12 ENCOUNTER — Ambulatory Visit (INDEPENDENT_AMBULATORY_CARE_PROVIDER_SITE_OTHER): Payer: BC Managed Care – PPO | Admitting: Urgent Care

## 2011-11-12 VITALS — BP 100/66 | HR 71 | Temp 97.3°F | Ht 62.0 in | Wt 174.8 lb

## 2011-11-12 DIAGNOSIS — R140 Abdominal distension (gaseous): Secondary | ICD-10-CM

## 2011-11-12 DIAGNOSIS — IMO0001 Reserved for inherently not codable concepts without codable children: Secondary | ICD-10-CM

## 2011-11-12 DIAGNOSIS — R141 Gas pain: Secondary | ICD-10-CM

## 2011-11-12 DIAGNOSIS — K219 Gastro-esophageal reflux disease without esophagitis: Secondary | ICD-10-CM

## 2011-11-12 DIAGNOSIS — E1165 Type 2 diabetes mellitus with hyperglycemia: Secondary | ICD-10-CM

## 2011-11-12 DIAGNOSIS — R109 Unspecified abdominal pain: Secondary | ICD-10-CM

## 2011-11-12 LAB — CBC WITH DIFFERENTIAL/PLATELET
Basophils Absolute: 0.1 10*3/uL (ref 0.0–0.1)
Basophils Relative: 1 % (ref 0–1)
Eosinophils Absolute: 0.3 10*3/uL (ref 0.0–0.7)
Eosinophils Relative: 3 % (ref 0–5)
HCT: 44.9 % (ref 36.0–46.0)
Hemoglobin: 15 g/dL (ref 12.0–15.0)
Lymphocytes Relative: 24 % (ref 12–46)
Lymphs Abs: 2.8 10*3/uL (ref 0.7–4.0)
MCH: 32 pg (ref 26.0–34.0)
MCHC: 33.4 g/dL (ref 30.0–36.0)
MCV: 95.7 fL (ref 78.0–100.0)
Monocytes Absolute: 1.1 10*3/uL — ABNORMAL HIGH (ref 0.1–1.0)
Monocytes Relative: 10 % (ref 3–12)
Neutro Abs: 7.4 10*3/uL (ref 1.7–7.7)
Neutrophils Relative %: 62 % (ref 43–77)
Platelets: 423 10*3/uL — ABNORMAL HIGH (ref 150–400)
RBC: 4.69 MIL/uL (ref 3.87–5.11)
RDW: 14.7 % (ref 11.5–15.5)
WBC: 11.7 10*3/uL — ABNORMAL HIGH (ref 4.0–10.5)

## 2011-11-12 NOTE — Progress Notes (Signed)
Faxed to PCP

## 2011-11-12 NOTE — Assessment & Plan Note (Signed)
Appt with Dr Fransico Him regarding poorly controlled diabetes Numbers given for several local PCPs to establish care

## 2011-11-12 NOTE — Progress Notes (Signed)
Referring Provider: Theodora Blow, FNP (previously) Primary Care Physician:  Reynolds Bowl, MD Primary Gastroenterologist:  Dr. Jena Gauss  Chief Complaint  Patient presents with  . Follow-up  . Emesis    HPI:  [name] is a 46 y.o. female here for follow up.  She has history of C. difficile with a positive PCR earlier this year. She was initially treated with Flagyl, followed by vancomycin. 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 She then developed epigastric pain, nausea & vomiting.  GES was normal.  She has been having post-prandial bloating & severe pain within 30 minutes  C/o LUQ & LLQ pain.  C/o nausea & vomiting after eating.  BM 1-2 per day.  Denies rectal bleeding or melena.  Wt loss 15# in past 3 months unintentionally.  Avoiding certain foods due to pain after eating.  Blood sugars 470s last night.  Very poorly controlled.  She no longer has PCP as the nurse practitioner she was seeing has left.  08/25/11 CT A/P w/contrast->Interval resolution of colitis. No evidence of residual or  recurrent colitis.  2. No other inflammatory changes demonstrated. The appendix  appears unchanged.  3. 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   08/09/11 CT A/P without contrast->Improvement in colitis. Hepatic flexure and proximal  transverse colonic wall thickening remains.  2. No new explanation for abdominal pain.  3. Hepatic steatosis and hepatomegaly. Redemonstration of  cavernous transformation of the portal vein.  4. New trace bilateral pleural effusions.   08/05/11 CT A/P->1. Pancolitis. The wall of the entire colon is circumferentially  thickened and there is prominence of the pericolonic vasculature  and slight pericolonic fat stranding. Findings could be due to  infectious colitis or inflammatory bowel disease. Consider testing  to exclude C difficile colitis.  2. 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  3. Fatty infiltration of the liver.    Vitals - 1 value per visit 11/12/2011 09/15/2011 08/29/2011 08/22/2011 08/12/2011  Weight (lb) 174.8 183.4  182.54 189.38   Past Medical History  Diagnosis Date  . Diabetes mellitus   . Hypertension   . High cholesterol   . Clostridium difficile colitis   . Sea-blue histiocyte syndrome     Past Surgical History  Procedure Date  . Splenectomy   . Tonsillectomy   . Abdominal hysterectomy     partial  . Esophagogastroduodenoscopy 08/27/2011    Rourk-reactive gastropathy, normal, patent esophagus. Stomach empty. Multiple linear antral erosions. No ulcer or infiltrating process. Small hiatal hernia. Patent pylorus. Normal first and second portion of the duodenum    Current Outpatient Prescriptions  Medication Sig Dispense Refill  . lisinopril (PRINIVIL,ZESTRIL) 20 MG tablet Take 20 mg by mouth daily.      Marland Kitchen omeprazole (PRILOSEC) 20 MG capsule Take 1 capsule (20 mg total) by mouth daily.  31 capsule  2  . pravastatin (PRAVACHOL) 20 MG tablet Take 20 mg by mouth daily.      . Probiotic Product (PROBIOTIC DAILY PO) Take by mouth daily.      . sitaGLIPtin (JANUVIA) 100 MG tablet Take 100 mg by mouth daily.      . simethicone (MYLICON) 125 MG chewable tablet Chew 1 tablet (125 mg total) by mouth 2 (two) times daily as needed for flatulence.  62 tablet  0    Allergies as of 11/12/2011 - Review Complete 11/12/2011  Allergen Reaction Noted  . Aspartame and phenylalanine  Shortness Of Breath and Swelling 08/05/2011  . Other Swelling 08/09/2011  . Sucralose Rash 08/09/2011    Review of Systems: See HPI, otherwise normal  Physical Exam: BP 100/66  Pulse 71  Temp 97.3 F (36.3 C) (Temporal)  Ht 5\' 2"  (1.575 m)  Wt 174 lb 12.8 oz (79.289 kg)  BMI 31.97 kg/m2 General:   Alert,  Well-developed, well-nourished, pleasant and cooperative in NAD Eyes:  Sclera clear, no icterus.   Conjunctiva pink. Mouth:  No deformity or lesions,  oropharynx pink and moist. Neck:  Supple; no masses or thyromegaly. Heart:  Regular rate and rhythm; no murmurs, clicks, rubs,  or gallops. Abdomen:  Normal bowel sounds.  No bruits.  Soft, non-tender and non-distended without masses, hepatosplenomegaly or hernias noted.  No guarding or rebound tenderness.   Rectal:  Deferred. Msk:  Symmetrical without gross deformities.  Pulses:  Normal pulses noted. Extremities:  No clubbing or edema. Neurologic:  Alert and oriented x4;  grossly normal neurologically. Skin:  Intact without significant lesions or rashes.

## 2011-11-12 NOTE — Assessment & Plan Note (Signed)
Continue align daily No relief w/ simethicone

## 2011-11-12 NOTE — Assessment & Plan Note (Addendum)
GERD & gastritis.  Continue omeprazole 20mg  daily.

## 2011-11-12 NOTE — Assessment & Plan Note (Addendum)
Persistent LUQ/LLQ pain s/p c diff earlier this year.  No persistent diarrhea.  Interestingly, pain has post-prandial component & is associated w/ nausea & vomiting.  Significant weight loss is concerning although may be related to poorly controlled diabetes.  Differentials include biliary etiology, occult malignancy, underlying IBD, SBBO or post-infectious IBS.  Multiple CT scans over the years without etiology of pain.  CBC, CMP,  TSH, lipase iFOBT Will need colonoscopy for unintentional weight loss pending labs

## 2011-11-12 NOTE — Patient Instructions (Addendum)
Go get your labs as soon as possible Return your iFOBT stool card to our office ASAP Appt with Dr Fransico Him regarding poorly controlled diabetes Please find a primary care physician as soon as possible-numbers given

## 2011-11-13 ENCOUNTER — Telehealth: Payer: Self-pay | Admitting: Urgent Care

## 2011-11-13 LAB — COMPREHENSIVE METABOLIC PANEL
ALT: 76 U/L — ABNORMAL HIGH (ref 0–35)
AST: 84 U/L — ABNORMAL HIGH (ref 0–37)
Albumin: 4 g/dL (ref 3.5–5.2)
Alkaline Phosphatase: 92 U/L (ref 39–117)
BUN: 8 mg/dL (ref 6–23)
CO2: 26 mEq/L (ref 19–32)
Calcium: 9.4 mg/dL (ref 8.4–10.5)
Chloride: 96 mEq/L (ref 96–112)
Creat: 0.64 mg/dL (ref 0.50–1.10)
Glucose, Bld: 386 mg/dL — ABNORMAL HIGH (ref 70–99)
Potassium: 4.3 mEq/L (ref 3.5–5.3)
Sodium: 132 mEq/L — ABNORMAL LOW (ref 135–145)
Total Bilirubin: 0.5 mg/dL (ref 0.3–1.2)
Total Protein: 7.3 g/dL (ref 6.0–8.3)

## 2011-11-13 LAB — LIPASE: Lipase: 18 U/L (ref 0–75)

## 2011-11-13 LAB — TSH: TSH: 0.887 u[IU]/mL (ref 0.350–4.500)

## 2011-11-13 NOTE — Progress Notes (Signed)
Quick Note:  Please let pt know her WBC ct & platelet ct are just a bit high & have been that way in the past. She should discuss w/ her PCP Blood sugar is way too high & she needs to be seen ASAP to discuss w/ either PCP or Dr Fransico Him Her LFTS are just mildly elevated too & sodium a bit low. She needs colonoscopy w/ Dr Jena Gauss re: Unintentional weight loss, abd pain & bloating, hx c diff [name], [name], MD Cc:Dr Nida   ______

## 2011-11-13 NOTE — Telephone Encounter (Signed)
A referral has been faxed to Dr. Fransico Him and just waiting for appointment date and time

## 2011-11-16 NOTE — Progress Notes (Signed)
Quick Note:  Tried to call pt- phone number was busy ______ 

## 2011-11-19 NOTE — Progress Notes (Signed)
Quick Note:  Tried to call pt- LMOM ______ 

## 2011-11-23 ENCOUNTER — Telehealth: Payer: Self-pay | Admitting: Urgent Care

## 2011-11-23 ENCOUNTER — Other Ambulatory Visit: Payer: Self-pay | Admitting: Internal Medicine

## 2011-11-23 DIAGNOSIS — R140 Abdominal distension (gaseous): Secondary | ICD-10-CM

## 2011-11-23 DIAGNOSIS — R109 Unspecified abdominal pain: Secondary | ICD-10-CM

## 2011-11-23 DIAGNOSIS — R634 Abnormal weight loss: Secondary | ICD-10-CM

## 2011-11-23 MED ORDER — PEG 3350-KCL-NA BICARB-NACL 420 G PO SOLR: ORAL | Status: AC

## 2011-11-23 NOTE — Progress Notes (Signed)
Quick Note:  Pt aware, Benedetto Goad, will you check on appt with Dr. Fransico Him and schedule tcs with RMR. ______

## 2011-11-23 NOTE — Telephone Encounter (Signed)
Opened in error

## 2011-11-23 NOTE — Telephone Encounter (Signed)
Needs BPE [id number] dysphagia s/p dilation thanks

## 2011-11-23 NOTE — Telephone Encounter (Signed)
Tried to call with no answer  

## 2011-11-23 NOTE — Telephone Encounter (Signed)
Spoke with pt- gave her lab results. She said for the last week she has been having problems with vomiting after she eats. She said the food goes down her throat and stops and stays there until she vomits. Please advise.

## 2011-11-23 NOTE — Progress Notes (Signed)
Appointment with Dr. Fransico Him is scheduled for Thursday Aug 8th and patient is aware and TCS w/RMR will be Monday Aug 19th and patient is aware and instructions were mailed

## 2011-11-23 NOTE — Telephone Encounter (Signed)
PATIENT IS CALLING TO GET LAB RESULTS AND STOOL STUDIES. STILL HAVING PROBLEMS SWALLOWING AFTER HER EGD WITH ED/ SHE FEELS LIKE EVERYTHING IS GETTING STUCK IN HER THROAT AND SHE THROWS UP PLEASE ADVISE?

## 2011-11-24 ENCOUNTER — Other Ambulatory Visit: Payer: Self-pay | Admitting: Internal Medicine

## 2011-11-24 ENCOUNTER — Other Ambulatory Visit: Payer: Self-pay | Admitting: Urgent Care

## 2011-11-24 ENCOUNTER — Encounter (HOSPITAL_COMMUNITY): Payer: Self-pay | Admitting: Pharmacy Technician

## 2011-11-24 DIAGNOSIS — R1310 Dysphagia, unspecified: Secondary | ICD-10-CM

## 2011-11-24 NOTE — Telephone Encounter (Signed)
Patient is scheduled for a BPE on Wednesday Aug 14th at 11:00 arrive at 10:45 and patient is aware

## 2011-11-24 NOTE — Telephone Encounter (Signed)
Pt aware, pt wants to know if they can do it on the 19th when she has her other procedure done. I informed pt that I didn't know if they could do both on the same day and would let [place] know.

## 2011-11-30 ENCOUNTER — Other Ambulatory Visit (HOSPITAL_COMMUNITY): Payer: BC Managed Care – PPO

## 2011-12-02 ENCOUNTER — Ambulatory Visit (HOSPITAL_COMMUNITY)
Admission: RE | Admit: 2011-12-02 | Discharge: 2011-12-02 | Disposition: A | Discharge status: Home / Self Care | Payer: BC Managed Care – PPO | Source: Ambulatory Visit | Attending: Urgent Care | Admitting: Urgent Care

## 2011-12-02 DIAGNOSIS — R1310 Dysphagia, unspecified: Secondary | ICD-10-CM | POA: Insufficient documentation

## 2011-12-03 NOTE — Progress Notes (Signed)
Quick Note:  Dr Jena Gauss,  Pt is s/p empiric dilation 08/27/11 w/ persistent dysphagia. ? Repeat EGD/ED for wide mucosal ring? KJ ______

## 2011-12-04 MED ORDER — SODIUM CHLORIDE 0.45 % IV SOLN
Freq: Once | INTRAVENOUS | Status: AC
  Administered 2011-12-07: 1000 mL via INTRAVENOUS

## 2011-12-04 NOTE — Progress Notes (Signed)
Quick Note:  Please call & let pt know BPE looks ok. Nothing obstructing esophagus. Continue Omeprazole 20 mg daily Needs office visit with Dr. Jena Gauss only within next 6-8 weeks Thanks CC: Reynolds Bowl, MD  ______

## 2011-12-07 ENCOUNTER — Encounter (HOSPITAL_COMMUNITY)
Admission: RE | Disposition: A | Discharge status: Home / Self Care | Payer: Self-pay | Source: Ambulatory Visit | Attending: Internal Medicine

## 2011-12-07 ENCOUNTER — Encounter (HOSPITAL_COMMUNITY): Payer: Self-pay | Admitting: *Deleted

## 2011-12-07 ENCOUNTER — Ambulatory Visit (HOSPITAL_COMMUNITY)
Admission: RE | Admit: 2011-12-07 | Discharge: 2011-12-07 | Disposition: A | Discharge status: Home / Self Care | Payer: BC Managed Care – PPO | Source: Ambulatory Visit | Attending: Internal Medicine | Admitting: Internal Medicine

## 2011-12-07 DIAGNOSIS — R198 Other specified symptoms and signs involving the digestive system and abdomen: Secondary | ICD-10-CM | POA: Insufficient documentation

## 2011-12-07 DIAGNOSIS — R933 Abnormal findings on diagnostic imaging of other parts of digestive tract: Secondary | ICD-10-CM

## 2011-12-07 DIAGNOSIS — Z79899 Other long term (current) drug therapy: Secondary | ICD-10-CM | POA: Insufficient documentation

## 2011-12-07 DIAGNOSIS — R109 Unspecified abdominal pain: Secondary | ICD-10-CM | POA: Insufficient documentation

## 2011-12-07 DIAGNOSIS — R634 Abnormal weight loss: Secondary | ICD-10-CM

## 2011-12-07 DIAGNOSIS — E119 Type 2 diabetes mellitus without complications: Secondary | ICD-10-CM | POA: Insufficient documentation

## 2011-12-07 DIAGNOSIS — R140 Abdominal distension (gaseous): Secondary | ICD-10-CM

## 2011-12-07 DIAGNOSIS — E785 Hyperlipidemia, unspecified: Secondary | ICD-10-CM | POA: Insufficient documentation

## 2011-12-07 DIAGNOSIS — I10 Essential (primary) hypertension: Secondary | ICD-10-CM | POA: Insufficient documentation

## 2011-12-07 HISTORY — PX: COLONOSCOPY: SHX5424

## 2011-12-07 LAB — GLUCOSE, CAPILLARY: Glucose-Capillary: 267 mg/dL — ABNORMAL HIGH (ref 70–99)

## 2011-12-07 SURGERY — COLONOSCOPY
Anesthesia: Moderate Sedation

## 2011-12-07 MED ORDER — MIDAZOLAM HCL 5 MG/5ML IJ SOLN
INTRAMUSCULAR | Status: AC
  Filled 2011-12-07: qty 10

## 2011-12-07 MED ORDER — MEPERIDINE HCL 100 MG/ML IJ SOLN
INTRAMUSCULAR | Status: DC | PRN
  Administered 2011-12-07: 50 mg via INTRAVENOUS
  Administered 2011-12-07: 25 mg via INTRAVENOUS

## 2011-12-07 MED ORDER — STERILE WATER FOR IRRIGATION IR SOLN
Status: DC | PRN
  Administered 2011-12-07: 08:00:00

## 2011-12-07 MED ORDER — MIDAZOLAM HCL 5 MG/5ML IJ SOLN
INTRAMUSCULAR | Status: DC | PRN
  Administered 2011-12-07 (×2): 2 mg via INTRAVENOUS
  Administered 2011-12-07: 1 mg via INTRAVENOUS

## 2011-12-07 MED ORDER — MEPERIDINE HCL 100 MG/ML IJ SOLN
INTRAMUSCULAR | Status: AC
  Filled 2011-12-07: qty 2

## 2011-12-07 NOTE — H&P (View-Only) (Signed)
Referring Provider: Hall, Michelle D, FNP (previously) Primary Care Physician:  COMSTOCK, [name], MD Primary Gastroenterologist:  Dr. Rourk  Chief Complaint  Patient presents with  . Follow-up  . Emesis    HPI:  [name] is a 46 y.o. female here for follow up.  She has history of C. difficile with a positive PCR earlier this year. She was initially treated with Flagyl, followed by vancomycin. 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 She then developed epigastric pain, nausea & vomiting.  GES was normal.  She has been having post-prandial bloating & severe pain within 30 minutes  C/o LUQ & LLQ pain.  C/o nausea & vomiting after eating.  BM 1-2 per day.  Denies rectal bleeding or melena.  Wt loss 15# in past 3 months unintentionally.  Avoiding certain foods due to pain after eating.  Blood sugars 470s last night.  Very poorly controlled.  She no longer has PCP as the nurse practitioner she was seeing has left.  08/25/11 CT A/P w/contrast->Interval resolution of colitis. No evidence of residual or  recurrent colitis.  2. No other inflammatory changes demonstrated. The appendix  appears unchanged.  3. 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   08/09/11 CT A/P without contrast->Improvement in colitis. Hepatic flexure and proximal  transverse colonic wall thickening remains.  2. No new explanation for abdominal pain.  3. Hepatic steatosis and hepatomegaly. Redemonstration of  cavernous transformation of the portal vein.  4. New trace bilateral pleural effusions.   08/05/11 CT A/P->1. Pancolitis. The wall of the entire colon is circumferentially  thickened and there is prominence of the pericolonic vasculature  and slight pericolonic fat stranding. Findings could be due to  infectious colitis or inflammatory bowel disease. Consider testing  to exclude C difficile colitis.  2. 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  3. Fatty infiltration of the liver.    Vitals - 1 value per visit 11/12/2011 09/15/2011 08/29/2011 08/22/2011 08/12/2011  Weight (lb) 174.8 183.4  182.54 189.38   Past Medical History  Diagnosis Date  . Diabetes mellitus   . Hypertension   . High cholesterol   . Clostridium difficile colitis   . Sea-blue histiocyte syndrome     Past Surgical History  Procedure Date  . Splenectomy   . Tonsillectomy   . Abdominal hysterectomy     partial  . Esophagogastroduodenoscopy 08/27/2011    Rourk-reactive gastropathy, normal, patent esophagus. Stomach empty. Multiple linear antral erosions. No ulcer or infiltrating process. Small hiatal hernia. Patent pylorus. Normal first and second portion of the duodenum    Current Outpatient Prescriptions  Medication Sig Dispense Refill  . lisinopril (PRINIVIL,ZESTRIL) 20 MG tablet Take 20 mg by mouth daily.      . omeprazole (PRILOSEC) 20 MG capsule Take 1 capsule (20 mg total) by mouth daily.  31 capsule  2  . pravastatin (PRAVACHOL) 20 MG tablet Take 20 mg by mouth daily.      . Probiotic Product (PROBIOTIC DAILY PO) Take by mouth daily.      . sitaGLIPtin (JANUVIA) 100 MG tablet Take 100 mg by mouth daily.      . simethicone (MYLICON) 125 MG chewable tablet Chew 1 tablet (125 mg total) by mouth 2 (two) times daily as needed for flatulence.  62 tablet  0    Allergies as of 11/12/2011 - Review Complete 11/12/2011  Allergen Reaction Noted  . Aspartame and phenylalanine   Shortness Of Breath and Swelling 08/05/2011  . Other Swelling 08/09/2011  . Sucralose Rash 08/09/2011    Review of Systems: See HPI, otherwise normal  Physical Exam: BP 100/66  Pulse 71  Temp 97.3 F (36.3 C) (Temporal)  Ht 5' 2" (1.575 m)  Wt 174 lb 12.8 oz (79.289 kg)  BMI 31.97 kg/m2 General:   Alert,  Well-developed, well-nourished, pleasant and cooperative in NAD Eyes:  Sclera clear, no icterus.   Conjunctiva pink. Mouth:  No deformity or lesions,  oropharynx pink and moist. Neck:  Supple; no masses or thyromegaly. Heart:  Regular rate and rhythm; no murmurs, clicks, rubs,  or gallops. Abdomen:  Normal bowel sounds.  No bruits.  Soft, non-tender and non-distended without masses, hepatosplenomegaly or hernias noted.  No guarding or rebound tenderness.   Rectal:  Deferred. Msk:  Symmetrical without gross deformities.  Pulses:  Normal pulses noted. Extremities:  No clubbing or edema. Neurologic:  Alert and oriented x4;  grossly normal neurologically. Skin:  Intact without significant lesions or rashes.    

## 2011-12-07 NOTE — Interval H&P Note (Signed)
History and Physical Interval Note:  12/07/2011 7:44 AM  Opel S Sobotta  has presented today for surgery, with the diagnosis of Unintentional weight loss and abdominal pain and bloating  The various methods of treatment have been discussed with the patient and family. After consideration of risks, benefits and other options for treatment, the patient has consented to  Procedure(s) (LRB): COLONOSCOPY (N/A) as a surgical intervention .  The patient's history has been reviewed, patient examined, no change in status, stable for surgery.  I have reviewed the patient's chart and labs.  Questions were answered to the patient's satisfaction.     Eula Listen

## 2011-12-07 NOTE — Op Note (Addendum)
[facility] [address] [place], [address]   COLONOSCOPY PROCEDURE REPORT  PATIENT: Patricia, Foster  MR#: [id number] BIRTHDATE: 09/16/1965 , 45  yrs. old GENDER: Female ENDOSCOPIST: R.  Roetta Sessions, MD FACP [facility] REFERRED BY:  Reynolds Bowl, M.D. PROCEDURE DATE:  12/07/2011  PROCEDURE:  INDICATIONS:  unintentional weight loss and change in bowel habits; abnormal colon on CT. Ongoing difficulties with management of diabetes mellitus. Hemoglobin A1c 12 per patient.  bowel symptoms back to baseline of constipation.  INFORMED CONSENT:  The risks, benefits, alternatives and imponderables including but not limited to bleeding, perforation as well as the possibility of a missed lesion have been reviewed.  The potential for biopsy, lesion removal, etc. have also been discussed.  Questions have been answered.  All parties agreeable. Please see the history and physical in the medical record for more information.  MEDICATIONS: Demerol 75 mg IV and Versed 5 mg IV in divided doses.  DESCRIPTION OF PROCEDURE:  After a digital rectal exam was performed, the EC-[id number] ([id number])  colonoscope was advanced from the anus through the rectum and colon to the area of the cecum, ileocecal valve and appendiceal orifice.  The cecum was deeply intubated.  These structures were well-seen and photographed for the record.  From the level of the cecum and ileocecal valve, the scope was slowly and cautiously withdrawn.  The mucosal surfaces were carefully surveyed utilizing scope tip deflection to facilitate fold flattening as needed.  The scope was pulled down into the rectum where a thorough examination including retroflexion was performed.    FINDINGS:  adequate preparation. Normal appearing rectal mucosa. The colonic mucosacolonic mucosa appeared normal. The distal 10 cm of terminal ileal mucosa appeared normal.  THERAPEUTIC / DIAGNOSTIC MANEUVERS PERFORMED:   none  COMPLICATIONS: none  CECAL WITHDRAWAL TIME:  9 minutes  IMPRESSION:  normal rectum,, colon and terminal ileum  RECOMMENDATIONS:   maximization of glycemic control. Repeat colonoscopy in 10 years for screening purposes. Office followup with [place] in one month.   _______________________________ R.  Roetta Sessions, MD Caleen Essex  CC:

## 2011-12-08 NOTE — Progress Notes (Signed)
Quick Note:  Pt aware, she already has appt to come in on 01/07/12 Dawn, please cc pcp ______

## 2011-12-08 NOTE — Progress Notes (Signed)
Faxed to PCP

## 2011-12-10 ENCOUNTER — Encounter (HOSPITAL_COMMUNITY): Payer: Self-pay | Admitting: Internal Medicine

## 2011-12-10 NOTE — Progress Notes (Signed)
REVIEWED.  

## 2011-12-23 NOTE — Progress Notes (Signed)
Records reviewed from office visit 12/17/11 with Dr. Fransico Him. Patient presented with severe hyperglycemia range of 250-350. Levemir was increased, NovoLog was changed to Humalog, and Victoza 0.6 mg daily was added. She has followup in 4 weeks with him.

## 2012-01-06 ENCOUNTER — Encounter: Payer: Self-pay | Admitting: Internal Medicine

## 2012-01-07 ENCOUNTER — Ambulatory Visit: Payer: BC Managed Care – PPO | Admitting: Gastroenterology

## 2012-01-07 ENCOUNTER — Encounter: Payer: Self-pay | Admitting: Urgent Care

## 2012-01-07 ENCOUNTER — Ambulatory Visit (INDEPENDENT_AMBULATORY_CARE_PROVIDER_SITE_OTHER): Payer: BC Managed Care – PPO | Admitting: Urgent Care

## 2012-01-07 VITALS — BP 115/79 | HR 77 | Temp 98.1°F | Ht 62.0 in | Wt 178.6 lb

## 2012-01-07 DIAGNOSIS — R7989 Other specified abnormal findings of blood chemistry: Secondary | ICD-10-CM

## 2012-01-07 DIAGNOSIS — R197 Diarrhea, unspecified: Secondary | ICD-10-CM

## 2012-01-07 DIAGNOSIS — E871 Hypo-osmolality and hyponatremia: Secondary | ICD-10-CM

## 2012-01-07 DIAGNOSIS — R945 Abnormal results of liver function studies: Secondary | ICD-10-CM

## 2012-01-07 DIAGNOSIS — K760 Fatty (change of) liver, not elsewhere classified: Secondary | ICD-10-CM

## 2012-01-07 DIAGNOSIS — K219 Gastro-esophageal reflux disease without esophagitis: Secondary | ICD-10-CM

## 2012-01-07 DIAGNOSIS — R112 Nausea with vomiting, unspecified: Secondary | ICD-10-CM

## 2012-01-07 DIAGNOSIS — D72829 Elevated white blood cell count, unspecified: Secondary | ICD-10-CM

## 2012-01-07 DIAGNOSIS — K7689 Other specified diseases of liver: Secondary | ICD-10-CM

## 2012-01-07 LAB — COMPREHENSIVE METABOLIC PANEL
ALT: 70 U/L — AB (ref 7–35)
AST: 88 U/L
Albumin: 4.3
Alkaline Phosphatase: 89 U/L
BUN: 12 mg/dL (ref 4–21)
Calcium: 9.7 mg/dL
Chloride: 100 mmol/L
Creat: 0.61
Glucose: 151
Hepatitis B Surface Antigen: NEGATIVE
Hepatitis C Ab: NEGATIVE
Hgb A1c MFr Bld: 11.2 % — AB (ref 4.0–6.0)
Potassium: 4.4 mmol/L
Sodium: 138 mmol/L (ref 137–147)
TSH: 1.21 u[IU]/mL (ref 0.41–5.90)
Total Bilirubin: 0.4 mg/dL
Total Protein: 7.9 g/dL
Vitamin D 1, 25 (OH)2 Total: 22

## 2012-01-07 MED ORDER — OMEPRAZOLE 20 MG PO CPDR: 20.0000 mg | DELAYED_RELEASE_CAPSULE | Freq: Every day | ORAL | Status: DC

## 2012-01-07 NOTE — Progress Notes (Signed)
Primary Care Physician:  No primary provider on file. Primary Gastroenterologist:  Dr. Jena Gauss  Chief Complaint  Patient presents with  . Follow-up    HPI:  [name] is a 46 y.o. female here for follow up colonoscopy, dysphagia & diarrhea.  Her nausea is intermittent & much better.  She has been working w/ Dr. Fransico Him on diabetes management & began new diabetes medications as below.  Blood sugars much improved.  Recent colonoscopy normal is reassuring.  Hx c diff earlier in the year.  Her diarrhea has pretty much resolved at this point.  BPE for persistent dysphagia is reassuring.  She did run out of prilosec 20mg  daily.  She feels like food stops in chest at time.  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  She has not established care with PCP as advised yet.    She has history of C. difficile with a positive PCR earlier this year. She was initially treated with Flagyl, followed by vancomycin. 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 Weight loss has stopped.  Hx fatty liver.  Last AST/ALT mildly elevated.  Also had very mild hyponatremia on last labs.  Denies excessive water intake.  Recent Results (from the past 840 hour(s))  GLUCOSE, CAPILLARY   Collection Time   12/07/11  7:07 AM      Component Value Range   Glucose-Capillary 267 (*) 70 - 99 mg/dL   Recent Results (from the past 1344 hour(s))  CBC WITH DIFFERENTIAL   Collection Time   11/12/11  1:05 PM      Component Value Range   WBC 11.7 (*) 4.0 - 10.5 K/uL   RBC 4.69  3.87 - 5.11 MIL/uL   Hemoglobin 15.0  12.0 - 15.0 g/dL   HCT 96.0  45.4 - 09.8 %   MCV 95.7  78.0 - 100.0 fL   MCH 32.0  26.0 - 34.0 pg   MCHC 33.4  30.0 - 36.0 g/dL   RDW 11.9  14.7 - 82.9 %   Platelets 423 (*) 150 - 400 K/uL   Neutrophils Relative 62  43 - 77 %   Neutro Abs 7.4  1.7 - 7.7 K/uL   Lymphocytes Relative 24  12 - 46 %   Lymphs Abs 2.8  0.7 - 4.0 K/uL   Monocytes Relative 10  3 -  12 %   Monocytes Absolute 1.1 (*) 0.1 - 1.0 K/uL   Eosinophils Relative 3  0 - 5 %   Eosinophils Absolute 0.3  0.0 - 0.7 K/uL   Basophils Relative 1  0 - 1 %   Basophils Absolute 0.1  0.0 - 0.1 K/uL   Smear Review Criteria for review not met    COMPREHENSIVE METABOLIC PANEL   Collection Time   11/12/11  1:05 PM      Component Value Range   Sodium 132 (*) 135 - 145 mEq/L   Potassium 4.3  3.5 - 5.3 mEq/L   Chloride 96  96 - 112 mEq/L   CO2 26  19 - 32 mEq/L   Glucose, Bld 386 (*) 70 - 99 mg/dL   BUN 8  6 - 23 mg/dL   Creat 5.62  1.30 - 8.65 mg/dL   Total Bilirubin 0.5  0.3 - 1.2 mg/dL   Alkaline Phosphatase 92  39 - 117 U/L   AST 84 (*) 0 - 37 U/L   ALT 76 (*) 0 - 35 U/L   Total Protein  7.3  6.0 - 8.3 g/dL   Albumin 4.0  3.5 - 5.2 g/dL   Calcium 9.4  8.4 - 96.0 mg/dL  LIPASE   Collection Time   11/12/11  1:05 PM      Component Value Range   Lipase 18  0 - 75 U/L  TSH   Collection Time   11/12/11  1:05 PM      Component Value Range   TSH 0.887  0.350 - 4.500 uIU/mL  GLUCOSE, CAPILLARY   Collection Time   12/07/11  7:07 AM      Component Value Range   Glucose-Capillary 267 (*) 70 - 99 mg/dL   Past Medical History  Diagnosis Date  . Diabetes mellitus   . Hypertension   . High cholesterol   . Clostridium difficile colitis   . Sea-blue histiocyte syndrome   . Leukocytosis     Dr Mariel Sleet    Past Surgical History  Procedure Date  . Splenectomy   . Tonsillectomy   . Abdominal hysterectomy     partial  . Esophagogastroduodenoscopy 08/27/2011    Rourk-reactive gastropathy, normal, patent esophagus. Stomach empty. Multiple linear antral erosions. No ulcer or infiltrating process. Small hiatal hernia. Patent pylorus. Normal first and second portion of the duodenum  . Colonoscopy 12/07/2011    Rourk-normal rectum,, colon and terminal ileum    Current Outpatient Prescriptions  Medication Sig Dispense Refill  . ibuprofen (ADVIL,MOTRIN) 200 MG tablet Take 600 mg by mouth  every 6 (six) hours as needed. For pain      . insulin aspart (NOVOLOG) 100 UNIT/ML injection Inject 10 Units into the skin 3 (three) times daily before meals.      . insulin detemir (LEVEMIR) 100 UNIT/ML injection Inject 50 Units into the skin at bedtime.       Marland Kitchen lisinopril (PRINIVIL,ZESTRIL) 20 MG tablet Take 20 mg by mouth daily.      . pravastatin (PRAVACHOL) 20 MG tablet Take 20 mg by mouth daily.      . Probiotic Product (PROBIOTIC DAILY PO) Take 1 tablet by mouth daily.       . metFORMIN (GLUCOPHAGE) 1000 MG tablet Take 1,000 mg by mouth 2 (two) times daily.      Marland Kitchen omeprazole (PRILOSEC) 20 MG capsule Take 1 capsule (20 mg total) by mouth daily.  31 capsule  11  . sitaGLIPtin (JANUVIA) 100 MG tablet Take 100 mg by mouth daily.        Allergies as of 01/07/2012 - Review Complete 01/07/2012  Allergen Reaction Noted  . Aspartame and phenylalanine Shortness Of Breath and Swelling 08/05/2011  . Sucralose Rash 08/09/2011    Review of Systems: See HPI, otherwise normal  Physical Exam: BP 115/79  Pulse 77  Temp 98.1 F (36.7 C) (Temporal)  Ht 5\' 2"  (1.575 m)  Wt 178 lb 9.6 oz (81.012 kg)  BMI 32.67 kg/m2 General:   Alert,  Well-developed, well-nourished, pleasant and cooperative in NAD Eyes:  Sclera clear, no icterus.   Conjunctiva pink. Mouth:  No deformity or lesions, oropharynx pink and moist. Neck:  Supple; no masses or thyromegaly. Heart:  Regular rate and rhythm; no murmurs, clicks, rubs,  or gallops. Abdomen:  Normal bowel sounds.  No bruits.  Soft, non-tender and non-distended without masses, hepatosplenomegaly or hernias noted.  No guarding or rebound tenderness.   Rectal:  Deferred. Msk:  Symmetrical without gross deformities.  Pulses:  Normal pulses noted. Extremities:  No clubbing or edema. Neurologic:  Alert and oriented  x4;  grossly normal neurologically. Skin:  Intact without significant lesions or rashes.

## 2012-01-07 NOTE — Assessment & Plan Note (Signed)
In setting of fatty liver.  Suspect NASH given risk factors of diabetes & obesity.  Check Hep B sAG & HCV Ab.  Recheck LFTS.

## 2012-01-07 NOTE — Assessment & Plan Note (Signed)
Resume PPI 

## 2012-01-07 NOTE — Assessment & Plan Note (Signed)
Chronic.  Establish care w/ PCP or FU w/ Dr Mariel Sleet for this.

## 2012-01-07 NOTE — Assessment & Plan Note (Signed)
Resolving.  Doing much better.  No need for antiemetics lately.  Normal GES.  Possibly secondary to hyperglycemia, medication effect, or GERD.  Resume omeprazole 20mg  daily

## 2012-01-07 NOTE — Progress Notes (Signed)
No PCP on file 

## 2012-01-07 NOTE — Assessment & Plan Note (Signed)
Recheck CMP as pt does not have PCP currently.

## 2012-01-07 NOTE — Patient Instructions (Addendum)
Please call 1 of the primary care providers we have given to establish care for your non-gastro related issues and to followup on your elevated white blood cell count Next screening colonoscopy August 2023 Please have your blood work done within the next week Office visit in 6 months with Dr. Jena Gauss Followup with Dr. Fransico Him for your diabetes Keep up the great work!

## 2012-01-07 NOTE — Assessment & Plan Note (Addendum)
Resolved.  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  Next screening colonoscopy August 2023 Office visit in 6 months with Dr. Jena Gauss Followup with Dr. Fransico Him

## 2012-01-18 NOTE — Progress Notes (Signed)
Labs reviewed from Dr Nida's office 01/07/12- AST 88, ALT 70, HepBsAg & HCVab negative. Otherwise normal LFTS. Hgb a1c 11.2 TSH normal  Please let pt know her LFTs are stable, but mildly elevated No evidence of Hepatitis B/C Suspect LFTs elevated due to fatty liver Instructions for fatty liver: Recommend 1-2# weight loss per week until ideal body weight through exercise & diet. Low fat/cholesterol diet. Gradually increase exercise from 15 min daily up to 1 hr per day 5 days/week. Limit alcohol use. It is very important that her diabetes gets in control.  Fatty liver can lead to cirrhosis. Thanks

## 2012-01-19 ENCOUNTER — Emergency Department (HOSPITAL_COMMUNITY): Payer: BC Managed Care – PPO

## 2012-01-19 ENCOUNTER — Emergency Department (HOSPITAL_COMMUNITY)
Admission: EM | Admit: 2012-01-19 | Discharge: 2012-01-19 | Disposition: A | Discharge status: Home / Self Care | Payer: BC Managed Care – PPO | Attending: Emergency Medicine | Admitting: Emergency Medicine

## 2012-01-19 ENCOUNTER — Encounter (HOSPITAL_COMMUNITY): Payer: Self-pay | Admitting: *Deleted

## 2012-01-19 DIAGNOSIS — Z79899 Other long term (current) drug therapy: Secondary | ICD-10-CM | POA: Insufficient documentation

## 2012-01-19 DIAGNOSIS — N390 Urinary tract infection, site not specified: Secondary | ICD-10-CM | POA: Insufficient documentation

## 2012-01-19 DIAGNOSIS — Z794 Long term (current) use of insulin: Secondary | ICD-10-CM | POA: Insufficient documentation

## 2012-01-19 DIAGNOSIS — I10 Essential (primary) hypertension: Secondary | ICD-10-CM | POA: Insufficient documentation

## 2012-01-19 DIAGNOSIS — R109 Unspecified abdominal pain: Secondary | ICD-10-CM | POA: Insufficient documentation

## 2012-01-19 DIAGNOSIS — E7800 Pure hypercholesterolemia, unspecified: Secondary | ICD-10-CM | POA: Insufficient documentation

## 2012-01-19 DIAGNOSIS — E119 Type 2 diabetes mellitus without complications: Secondary | ICD-10-CM | POA: Insufficient documentation

## 2012-01-19 DIAGNOSIS — R630 Anorexia: Secondary | ICD-10-CM | POA: Insufficient documentation

## 2012-01-19 DIAGNOSIS — Z87891 Personal history of nicotine dependence: Secondary | ICD-10-CM | POA: Insufficient documentation

## 2012-01-19 DIAGNOSIS — R112 Nausea with vomiting, unspecified: Secondary | ICD-10-CM | POA: Insufficient documentation

## 2012-01-19 LAB — CBC WITH DIFFERENTIAL/PLATELET
Basophils Absolute: 0 10*3/uL (ref 0.0–0.1)
Basophils Relative: 0 % (ref 0–1)
Eosinophils Absolute: 1 10*3/uL — ABNORMAL HIGH (ref 0.0–0.7)
Eosinophils Relative: 5 % (ref 0–5)
HCT: 45 % (ref 36.0–46.0)
Hemoglobin: 15.7 g/dL — ABNORMAL HIGH (ref 12.0–15.0)
Lymphocytes Relative: 33 % (ref 12–46)
Lymphs Abs: 5.9 10*3/uL — ABNORMAL HIGH (ref 0.7–4.0)
MCH: 33.6 pg (ref 26.0–34.0)
MCHC: 34.9 g/dL (ref 30.0–36.0)
MCV: 96.4 fL (ref 78.0–100.0)
Monocytes Absolute: 1.8 10*3/uL — ABNORMAL HIGH (ref 0.1–1.0)
Monocytes Relative: 10 % (ref 3–12)
Neutro Abs: 9.3 10*3/uL — ABNORMAL HIGH (ref 1.7–7.7)
Neutrophils Relative %: 51 % (ref 43–77)
Platelets: 504 10*3/uL — ABNORMAL HIGH (ref 150–400)
RBC: 4.67 MIL/uL (ref 3.87–5.11)
RDW: 14.6 % (ref 11.5–15.5)
WBC: 18 10*3/uL — ABNORMAL HIGH (ref 4.0–10.5)

## 2012-01-19 LAB — COMPREHENSIVE METABOLIC PANEL
ALT: 63 U/L — ABNORMAL HIGH (ref 0–35)
AST: 51 U/L — ABNORMAL HIGH (ref 0–37)
Albumin: 3.8 g/dL (ref 3.5–5.2)
Alkaline Phosphatase: 83 U/L (ref 39–117)
BUN: 8 mg/dL (ref 6–23)
CO2: 28 mEq/L (ref 19–32)
Calcium: 9.9 mg/dL (ref 8.4–10.5)
Chloride: 98 mEq/L (ref 96–112)
Creatinine, Ser: 0.55 mg/dL (ref 0.50–1.10)
GFR calc Af Amer: >90 mL/min (ref >90)
GFR calc non Af Amer: >90 mL/min (ref >90)
Glucose, Bld: 116 mg/dL — ABNORMAL HIGH (ref 70–99)
Potassium: 3.9 mEq/L (ref 3.5–5.1)
Sodium: 136 mEq/L (ref 135–145)
Total Bilirubin: 0.4 mg/dL (ref 0.3–1.2)
Total Protein: 7.7 g/dL (ref 6.0–8.3)

## 2012-01-19 LAB — URINE MICROSCOPIC-ADD ON

## 2012-01-19 LAB — URINALYSIS, ROUTINE W REFLEX MICROSCOPIC
Bilirubin Urine: NEGATIVE
Glucose, UA: 500 mg/dL — AB
Hgb urine dipstick: NEGATIVE
Ketones, ur: NEGATIVE mg/dL
Nitrite: POSITIVE — AB
Protein, ur: NEGATIVE mg/dL
Specific Gravity, Urine: 1.02 (ref 1.005–1.030)
Urobilinogen, UA: 0.2 mg/dL (ref 0.0–1.0)
pH: 5.5 (ref 5.0–8.0)

## 2012-01-19 LAB — LIPASE, BLOOD: Lipase: 25 U/L (ref 11–59)

## 2012-01-19 LAB — LACTIC ACID, PLASMA: Lactic Acid, Venous: 1.1 mmol/L (ref 0.5–2.2)

## 2012-01-19 MED ORDER — ONDANSETRON HCL 4 MG/2ML IJ SOLN
4.0000 mg | Freq: Once | INTRAMUSCULAR | Status: AC
  Administered 2012-01-19: 4 mg via INTRAVENOUS
  Filled 2012-01-19: qty 2

## 2012-01-19 MED ORDER — HYDROMORPHONE HCL PF 1 MG/ML IJ SOLN
1.0000 mg | Freq: Once | INTRAMUSCULAR | Status: AC
  Administered 2012-01-19: 1 mg via INTRAVENOUS
  Filled 2012-01-19: qty 1

## 2012-01-19 MED ORDER — IOHEXOL 300 MG/ML  SOLN
100.0000 mL | Freq: Once | INTRAMUSCULAR | Status: AC | PRN
  Administered 2012-01-19: 100 mL via INTRAVENOUS

## 2012-01-19 MED ORDER — ONDANSETRON HCL 4 MG PO TABS: 4.0000 mg | ORAL_TABLET | Freq: Four times a day (QID) | ORAL | Status: DC

## 2012-01-19 MED ORDER — CEPHALEXIN 500 MG PO CAPS: 500.0000 mg | ORAL_CAPSULE | Freq: Four times a day (QID) | ORAL | Status: DC

## 2012-01-19 MED ORDER — SODIUM CHLORIDE 0.9 % IV BOLUS (SEPSIS)
1000.0000 mL | Freq: Once | INTRAVENOUS | Status: AC
  Administered 2012-01-19: 1000 mL via INTRAVENOUS

## 2012-01-19 MED ORDER — DEXTROSE 5 % IV SOLN
1.0000 g | Freq: Once | INTRAVENOUS | Status: AC
  Administered 2012-01-19: 1 g via INTRAVENOUS
  Filled 2012-01-19: qty 10

## 2012-01-19 MED ORDER — OXYCODONE-ACETAMINOPHEN 5-325 MG PO TABS: 2.0000 | ORAL_TABLET | ORAL | Status: DC | PRN

## 2012-01-19 NOTE — ED Notes (Signed)
Pt c/o NV and abdominal pain since Sunday. Pt describes pain as cramping. Pt denies diarrhea and also denies any blood in vomit. Pt states she had similar abdominal pain 2 previous times and was diagnosed with c-diff.

## 2012-01-19 NOTE — ED Notes (Signed)
Pt continues to c/o abdominal pain, however has refused additional testing offered by physician.  Discussed treatment and pain control for UTI. Pt verbalized understanding.

## 2012-01-19 NOTE — ED Provider Notes (Signed)
History   This chart was scribed for Glynn Octave, MD by Gerlean Ren. This patient was seen in room APA05/APA05 and the patient's care was started at 6:54PM.   CSN: [id number]  Arrival date & time 01/19/12  1537   First MD Initiated Contact with Patient 01/19/12 1848      Chief Complaint  Patient presents with  . Abdominal Pain  . Nausea  . Emesis    (Consider location/radiation/quality/duration/timing/severity/associated sxs/prior treatment) Patient is a 46 y.o. female presenting with abdominal pain and vomiting. The history is provided by the patient. No language interpreter was used.  Abdominal Pain The primary symptoms of the illness include abdominal pain and vomiting.  Emesis  Associated symptoms include abdominal pain.   [name] is a 46 y.o. female who presents to the Emergency Department complaining of 3 days of gradually worsening, moderate, grabbing, non-radiating LQ abdominal pain with associated nausea and non-bloody, non-bilious emesis last reported 12 hours ago and decreased appetite.  Pt has h/o similar pain.  Pt denies fever, neck pain, sore throat, visual disturbance, CP, cough, SOB, diarrhea, urinary symptoms, back pain, HA, weakness, numbness and rash as associated symptoms.  Pt has h/o DM and HTN.  Pt is a former everyday smoker (quit 04/2007) and denies alcohol use.      Past Medical History  Diagnosis Date  . Diabetes mellitus   . Hypertension   . High cholesterol   . Clostridium difficile colitis   . Sea-blue histiocyte syndrome   . Leukocytosis     Dr Mariel Sleet    Past Surgical History  Procedure Date  . Splenectomy   . Tonsillectomy   . Abdominal hysterectomy     partial  . Esophagogastroduodenoscopy 08/27/2011    Rourk-reactive gastropathy, normal, patent esophagus. Stomach empty. Multiple linear antral erosions. No ulcer or infiltrating process. Small hiatal hernia. Patent pylorus. Normal first and second portion of the duodenum  .  Colonoscopy 12/07/2011    Rourk-normal rectum,, colon and terminal ileum    Family History  Problem Relation Age of Onset  . Colon cancer Neg Hx     History  Substance Use Topics  . Smoking status: Former Smoker -- 1.0 packs/day for 30 years    Types: Cigarettes    Quit date: 04/21/2007  . Smokeless tobacco: Not on file  . Alcohol Use: No    No OB history provided.   Review of Systems  Gastrointestinal: Positive for vomiting and abdominal pain.  A complete 10 system review of systems was obtained and all systems are negative except as noted in the HPI and PMH.    Allergies  Aspartame and phenylalanine and Sucralose  Home Medications   Current Outpatient Rx  Name Route Sig Dispense Refill  . INSULIN GLARGINE 100 UNIT/ML [place] SOLN Subcutaneous Inject 60 Units into the skin at bedtime.    . INSULIN GLULISINE 100 UNIT/ML [place] SOLN Subcutaneous Inject into the skin 3 (three) times daily before meals. As directed per sliding scale : 90-150= 15 units. *Increase insulin injection by 1 unit for every 50 units levels are increased*    . LIRAGLUTIDE 18 MG/3ML [place] SOLN Subcutaneous Inject 1.2 mg into the skin at bedtime.    Marland Kitchen LISINOPRIL 20 MG PO TABS Oral Take 20 mg by mouth daily.    Marland Kitchen OMEPRAZOLE 20 MG PO CPDR Oral Take 20 mg by mouth 2 (two) times daily.    Marland Kitchen PRAVASTATIN SODIUM 20 MG PO TABS Oral Take 40 mg by mouth  daily.     Marland Kitchen PROBIOTIC DAILY PO Oral Take 1 tablet by mouth daily.     . IBUPROFEN 200 MG PO TABS Oral Take 600 mg by mouth every 6 (six) hours as needed. For pain      BP 142/88  Pulse 85  Temp 98.2 F (36.8 C) (Oral)  Resp 20  Ht 5\' 2"  (1.575 m)  Wt 168 lb (76.204 kg)  BMI 30.73 kg/m2  SpO2 99%  Physical Exam  Nursing note and vitals reviewed. Constitutional: She is oriented to person, place, and time. She appears well-developed.       No distress, playing on phone.  HENT:  Head: Normocephalic and atraumatic.  Mouth/Throat: Oropharynx is clear and moist.    Eyes: EOM are normal.  Neck: Normal range of motion. No tracheal deviation present.  Cardiovascular: Normal rate, regular rhythm and normal heart sounds.   No murmur heard. Pulmonary/Chest: Effort normal and breath sounds normal. She has no wheezes.  Abdominal: Soft. Bowel sounds are normal. There is no rebound and no guarding.       Mild left quadrant tenderness.  Musculoskeletal: Normal range of motion. She exhibits no edema.  Neurological: She is alert and oriented to person, place, and time.  Skin: Skin is warm.  Psychiatric: She has a normal mood and affect.    ED Course  Procedures (including critical care time) DIAGNOSTIC STUDIES: Oxygen Saturation is 99% on room air, normal by my interpretation.    COORDINATION OF CARE: 7:00PM- Ordered IV fluids, zofran, dilaudid, CBC, c-met, lactic acid, urinalysis, lipase, and abdominal pelvic CT. 9:53PM- Re-check, informed pt of urine infection.  Also informed pt of ovarian cyst.  Labs Reviewed  CBC WITH DIFFERENTIAL - Abnormal; Notable for the following:    WBC 18.0 (*)     Hemoglobin 15.7 (*)     Platelets 504 (*)     Neutro Abs 9.3 (*)     Lymphs Abs 5.9 (*)     Monocytes Absolute 1.8 (*)     Eosinophils Absolute 1.0 (*)     All other components within normal limits  COMPREHENSIVE METABOLIC PANEL - Abnormal; Notable for the following:    Glucose, Bld 116 (*)     AST 51 (*)     ALT 63 (*)     All other components within normal limits  URINALYSIS, ROUTINE W REFLEX MICROSCOPIC - Abnormal; Notable for the following:    Glucose, UA 500 (*)     Nitrite POSITIVE (*)     Leukocytes, UA TRACE (*)     All other components within normal limits  URINE MICROSCOPIC-ADD ON - Abnormal; Notable for the following:    Squamous Epithelial / LPF FEW (*)     Bacteria, UA MANY (*)     All other components within normal limits  LACTIC ACID, PLASMA  LIPASE, BLOOD   Ct Abdomen Pelvis W Contrast  01/19/2012  *RADIOLOGY REPORT*  Clinical Data:  Left-sided abdominal pain  CT ABDOMEN AND PELVIS WITH CONTRAST  Technique:  Multidetector CT imaging of the abdomen and pelvis was performed following the standard protocol during bolus administration of intravenous contrast.  Contrast: OMNIPAQUE IOHEXOL 300 MG/ML  SOLN  Comparison: 08/25/2011  Findings: Lung bases are clear.  Heart size within normal limits. Trace pericardial fluid.  No pleural effusion.  Small hiatal hernia.  Low attenuation of the liver is in keeping with fatty infiltration. No focal lesion identified.  Absent spleen.  Unremarkable pancreas, biliary system,  and adrenal glands.  Symmetric renal enhancement.  No hydronephrosis or hydroureter.  No bowel obstruction.  No CT evidence for colitis.  Normal appendix.  No free intraperitoneal air or fluid.  Prominent small bowel mesentery and retroperitoneal lymph nodes are similar to prior.  There is cavernous transformation of the portal vein and collateral mesenteric venous flow.  Is similar to prior.  The aorta is of normal caliber with scattered atherosclerotic disease of the aorta and branch vessels.  Thin-walled bladder.  Small fat containing left inguinal hernia. Absent uterus.  2.3 cm left adnexal cyst.  No acute osseous finding.  IMPRESSION: No acute intra-abdominal process identified.  Hepatic steatosis.  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  Status post splenectomy.  2.3 cm left adnexal cyst is incompletely characterized however similar to priors. If clinically warranted, carried further evaluate the pelvic ultrasound follow-up.   Original Report Authenticated By: Waneta Martins, M.D.    Results for orders placed during the hospital encounter of 01/19/12  CBC WITH DIFFERENTIAL      Component Value Range   WBC 18.0 (*) 4.0 - 10.5 K/uL   RBC 4.67  3.87 - 5.11 MIL/uL   Hemoglobin 15.7 (*) 12.0 - 15.0 g/dL   HCT 32.4  40.1 - 02.7 %   MCV 96.4  78.0 - 100.0 fL   MCH 33.6  26.0 - 34.0 pg    MCHC 34.9  30.0 - 36.0 g/dL   RDW 25.3  66.4 - 40.3 %   Platelets 504 (*) 150 - 400 K/uL   Neutrophils Relative 51  43 - 77 %   Neutro Abs 9.3 (*) 1.7 - 7.7 K/uL   Lymphocytes Relative 33  12 - 46 %   Lymphs Abs 5.9 (*) 0.7 - 4.0 K/uL   Monocytes Relative 10  3 - 12 %   Monocytes Absolute 1.8 (*) 0.1 - 1.0 K/uL   Eosinophils Relative 5  0 - 5 %   Eosinophils Absolute 1.0 (*) 0.0 - 0.7 K/uL   Basophils Relative 0  0 - 1 %   Basophils Absolute 0.0  0.0 - 0.1 K/uL  COMPREHENSIVE METABOLIC PANEL      Component Value Range   Sodium 136  135 - 145 mEq/L   Potassium 3.9  3.5 - 5.1 mEq/L   Chloride 98  96 - 112 mEq/L   CO2 28  19 - 32 mEq/L   Glucose, Bld 116 (*) 70 - 99 mg/dL   BUN 8  6 - 23 mg/dL   Creatinine, Ser 4.74  0.50 - 1.10 mg/dL   Calcium 9.9  8.4 - 25.9 mg/dL   Total Protein 7.7  6.0 - 8.3 g/dL   Albumin 3.8  3.5 - 5.2 g/dL   AST 51 (*) 0 - 37 U/L   ALT 63 (*) 0 - 35 U/L   Alkaline Phosphatase 83  39 - 117 U/L   Total Bilirubin 0.4  0.3 - 1.2 mg/dL   GFR calc non Af Amer >90  >90 mL/min   GFR calc Af Amer >90  >90 mL/min  LACTIC ACID, PLASMA      Component Value Range   Lactic Acid, Venous 1.1  0.5 - 2.2 mmol/L  URINALYSIS, ROUTINE W REFLEX MICROSCOPIC      Component Value Range   Color, Urine YELLOW  YELLOW   APPearance CLEAR  CLEAR   Specific Gravity, Urine 1.020  1.005 - 1.030   pH 5.5  5.0 - 8.0  Glucose, UA 500 (*) NEGATIVE mg/dL   Hgb urine dipstick NEGATIVE  NEGATIVE   Bilirubin Urine NEGATIVE  NEGATIVE   Ketones, ur NEGATIVE  NEGATIVE mg/dL   Protein, ur NEGATIVE  NEGATIVE mg/dL   Urobilinogen, UA 0.2  0.0 - 1.0 mg/dL   Nitrite POSITIVE (*) NEGATIVE   Leukocytes, UA TRACE (*) NEGATIVE  LIPASE, BLOOD      Component Value Range   Lipase 25  11 - 59 U/L  URINE MICROSCOPIC-ADD ON      Component Value Range   Squamous Epithelial / LPF FEW (*) RARE   WBC, UA 3-6  <3 WBC/hpf   Bacteria, UA MANY (*) RARE    Ct Abdomen Pelvis W Contrast  01/19/2012   *RADIOLOGY REPORT*  Clinical Data: Left-sided abdominal pain  CT ABDOMEN AND PELVIS WITH CONTRAST  Technique:  Multidetector CT imaging of the abdomen and pelvis was performed following the standard protocol during bolus administration of intravenous contrast.  Contrast: OMNIPAQUE IOHEXOL 300 MG/ML  SOLN  Comparison: 08/25/2011  Findings: Lung bases are clear.  Heart size within normal limits. Trace pericardial fluid.  No pleural effusion.  Small hiatal hernia.  Low attenuation of the liver is in keeping with fatty infiltration. No focal lesion identified.  Absent spleen.  Unremarkable pancreas, biliary system, and adrenal glands.  Symmetric renal enhancement.  No hydronephrosis or hydroureter.  No bowel obstruction.  No CT evidence for colitis.  Normal appendix.  No free intraperitoneal air or fluid.  Prominent small bowel mesentery and retroperitoneal lymph nodes are similar to prior.  There is cavernous transformation of the portal vein and collateral mesenteric venous flow.  Is similar to prior.  The aorta is of normal caliber with scattered atherosclerotic disease of the aorta and branch vessels.  Thin-walled bladder.  Small fat containing left inguinal hernia. Absent uterus.  2.3 cm left adnexal cyst.  No acute osseous finding.  IMPRESSION: No acute intra-abdominal process identified.  Hepatic steatosis.  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  Status post splenectomy.  2.3 cm left adnexal cyst is incompletely characterized however similar to priors. If clinically warranted, carried further evaluate the pelvic ultrasound follow-up.   Original Report Authenticated By: Waneta Martins, M.D.     No diagnosis found.    MDM  2 Days of left-sided abdominal pain, nausea and vomiting. Pain feels similar to previous episodes of C. difficile colitis. No vomiting in the past 24 hours. No fever, difficulty with urination. No diarrhea, blood in stool, recent antibiotic  use.  Leukocytosis noted. Urinalysis shows infection. We'll sent culture. Give Rocephin. CT scan is not show acute abdominal process.  Informed of left adnexal cyst. She declines pelvic exam despite the fact that her pain is left-sided and lower and her abdomen.  Patient's pain remains. With history of ovarian cyst, ovarian torsion remains on differential.  Refuses further evaluation in terms of pelvic exam or pelvic ultrasound. She states she wants to go home. She will leave AGAINST MEDICAL ADVICE as her evaluation is not complete.  I personally performed the services described in this documentation, which was scribed in my presence.  The recorded information has been reviewed and considered.        Glynn Octave, MD 01/20/12 [phone]

## 2012-01-19 NOTE — ED Notes (Signed)
Pt presents with N/V, abdominal pain and low grade fevers that began Sunday evening.  Last emesis was early this morning.

## 2012-01-20 ENCOUNTER — Telehealth: Payer: Self-pay

## 2012-01-20 NOTE — Progress Notes (Signed)
Pt aware.

## 2012-01-20 NOTE — Telephone Encounter (Signed)
Gastroenterology Phone Call Form  Primary Physician:   1) Reason for phone call: pt started having diarrhea and abd pain last week, the diarrhea is now resolved but she is constipated- last BM was Sunday and it was normal. Vomiting this past weekend. No vomiting now. Some nausea. Pt reports pain under ribs down to pelvis on L side. Pt was seen at [facility] ED yesterday (01/19/12). Pt was given keflex and pain meds but is afraid to take abx d/t hx of c-diff.   2) When did this start? Last week approximately 01/13/12.    3) Have you been seen for this here before?  yes    4) If Pain, where is it located?  the [facility]   5) Rate your pain/discomfort on scale 1-10?    6) Have you tried any medication for this?no was given abx in hospital but is afraid to take it.   7) Do you have any trouble breathing, chest pain, vomiting blood, passing bloody stools, feel dehydrated, dizzy or have a high fever?no    Pt is aware that Doristine Church is out of the office until tomorrow and stated it was ok to call her back tomorrow.

## 2012-01-21 ENCOUNTER — Telehealth: Payer: Self-pay | Admitting: *Deleted

## 2012-01-21 NOTE — Telephone Encounter (Signed)
See phone note

## 2012-01-21 NOTE — Telephone Encounter (Signed)
Reviewed ER records. LMOM for return call from pt. She should take antibiotic keflex for UTI as she needs it. Take ALIGN daily for at least 1 mo to put GOOD bacteria in her gut. If she develops any diarrhea, severe pain or fever, she should let us know.

## 2012-01-21 NOTE — Telephone Encounter (Signed)
Returned pt call & left message. If pt returns call, please give info below. Thanks

## 2012-01-21 NOTE — Telephone Encounter (Signed)
Ms Arch called today, returning your call. Please call her back. Thanks.

## 2012-01-22 NOTE — Telephone Encounter (Signed)
Tried to call pt- number was busy  

## 2012-01-25 NOTE — Telephone Encounter (Signed)
Tried to call pt- LMOM 

## 2012-01-27 NOTE — Telephone Encounter (Signed)
Tried to call pt- number was busy  

## 2012-02-02 NOTE — Telephone Encounter (Signed)
Unable to reach pt by phone, mailed letter

## 2012-02-04 ENCOUNTER — Ambulatory Visit (INDEPENDENT_AMBULATORY_CARE_PROVIDER_SITE_OTHER): Payer: BC Managed Care – PPO | Admitting: Family Medicine

## 2012-02-04 ENCOUNTER — Encounter: Payer: Self-pay | Admitting: Family Medicine

## 2012-02-04 VITALS — BP 124/84 | HR 75 | Resp 15 | Ht 62.5 in | Wt 188.4 lb

## 2012-02-04 DIAGNOSIS — IMO0002 Reserved for concepts with insufficient information to code with codable children: Secondary | ICD-10-CM

## 2012-02-04 DIAGNOSIS — I10 Essential (primary) hypertension: Secondary | ICD-10-CM

## 2012-02-04 DIAGNOSIS — R5383 Other fatigue: Secondary | ICD-10-CM

## 2012-02-04 DIAGNOSIS — M791 Myalgia, unspecified site: Secondary | ICD-10-CM

## 2012-02-04 DIAGNOSIS — E1165 Type 2 diabetes mellitus with hyperglycemia: Secondary | ICD-10-CM

## 2012-02-04 DIAGNOSIS — R531 Weakness: Secondary | ICD-10-CM

## 2012-02-04 DIAGNOSIS — E756 Lipid storage disorder, unspecified: Secondary | ICD-10-CM

## 2012-02-04 DIAGNOSIS — E7800 Pure hypercholesterolemia, unspecified: Secondary | ICD-10-CM

## 2012-02-04 DIAGNOSIS — D72829 Elevated white blood cell count, unspecified: Secondary | ICD-10-CM

## 2012-02-04 DIAGNOSIS — R5381 Other malaise: Secondary | ICD-10-CM

## 2012-02-04 DIAGNOSIS — IMO0001 Reserved for inherently not codable concepts without codable children: Secondary | ICD-10-CM

## 2012-02-04 DIAGNOSIS — D763 Other histiocytosis syndromes: Secondary | ICD-10-CM

## 2012-02-04 LAB — CBC WITH DIFFERENTIAL/PLATELET
Basophils Absolute: 0.1 10*3/uL (ref 0.0–0.1)
Basophils Relative: 1 % (ref 0–1)
Eosinophils Absolute: 0.4 10*3/uL (ref 0.0–0.7)
Eosinophils Relative: 3 % (ref 0–5)
HCT: 44.9 % (ref 36.0–46.0)
Hemoglobin: 15.5 g/dL — ABNORMAL HIGH (ref 12.0–15.0)
Lymphocytes Relative: 40 % (ref 12–46)
Lymphs Abs: 4.6 10*3/uL — ABNORMAL HIGH (ref 0.7–4.0)
MCH: 32.8 pg (ref 26.0–34.0)
MCHC: 34.5 g/dL (ref 30.0–36.0)
MCV: 94.9 fL (ref 78.0–100.0)
Monocytes Absolute: 1.6 10*3/uL — ABNORMAL HIGH (ref 0.1–1.0)
Monocytes Relative: 14 % — ABNORMAL HIGH (ref 3–12)
Neutro Abs: 5 10*3/uL (ref 1.7–7.7)
Neutrophils Relative %: 42 % — ABNORMAL LOW (ref 43–77)
Platelets: 571 10*3/uL — ABNORMAL HIGH (ref 150–400)
RBC: 4.73 MIL/uL (ref 3.87–5.11)
RDW: 14.6 % (ref 11.5–15.5)
WBC: 11.7 10*3/uL — ABNORMAL HIGH (ref 4.0–10.5)

## 2012-02-04 LAB — VITAMIN B12: Vitamin B-12: 529 pg/mL (ref 211–911)

## 2012-02-04 NOTE — Patient Instructions (Addendum)
Labs to be done - we will call with results  I will obtain records Continue current medications  F/U 3 months

## 2012-02-04 NOTE — Progress Notes (Signed)
  Subjective:    Patient ID: [name], female    DOB: 1965-07-11, 46 y.o.   MRN: [id number]  HPI  Pt here to establish care. Previous PCP [facility]. Gastroenterologist Dr. Jena Gauss   endocrinologist Dr. Fransico Him Medications and history reviewed. Patient has history of diabetes mellitus diagnosed 5 years ago she's currently on Lantus, Victoza and Apidra and is following with endocrinology. She is seen by gastroenterology secondary to history of recurrent C. difficile which required hospitalization. 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 She was concerned as on routine labs a few weeks ago she was told that her white blood cell count was elevated again. She also has history of generalized muscle aches and pains since 2010 she often is unable to get out of bed secondary to muscle aches all over. She has no diagnoses she does have a family history of  psoriatic arthritis  Review of Systems  GEN- denies fatigue, fever, weight loss,weakness, recent illness HEENT- denies eye drainage, change in vision, nasal discharge, CVS- denies chest pain, palpitations RESP- denies SOB, cough, wheeze ABD- denies N/V, change in stools, abd pain GU- denies dysuria, hematuria, dribbling, incontinence MSK- denies joint pain, +muscle aches, injury Neuro- denies headache, dizziness, syncope, seizure activity      Objective:   Physical Exam GEN- NAD, alert and oriented x3 HEENT- PERRL, EOMI, non injected sclera, pink conjunctiva, MMM, oropharynx clear Neck- Supple,  CVS- RRR, no murmur RESP-CTAB ABD-NABS,soft,nt,nd EXT- No edema, TTP on lower, and upper ext, back, no swelling, no joint swelling  Pulses- Radial, DP- 2+ Psych-normal affect and mood         Assessment & Plan:

## 2012-02-05 LAB — SEDIMENTATION RATE: Sed Rate: 4 mm/hr (ref 0–22)

## 2012-02-05 LAB — ANA: Anti Nuclear Antibody(ANA): NEGATIVE

## 2012-02-05 LAB — C-REACTIVE PROTEIN: CRP: <0.5 mg/dL (ref <0.60)

## 2012-02-07 DIAGNOSIS — M791 Myalgia, unspecified site: Secondary | ICD-10-CM | POA: Insufficient documentation

## 2012-02-07 NOTE — Assessment & Plan Note (Signed)
Well controlled 

## 2012-02-07 NOTE — Assessment & Plan Note (Signed)
On statin therapy, obtain records

## 2012-02-07 NOTE — Assessment & Plan Note (Signed)
obtain endocrinology notes

## 2012-02-07 NOTE — Assessment & Plan Note (Signed)
WBC elevated to 18,000 a few weeks ago, will recheck, it appears she has had fluctuating levels in the past since her splenectomy, will likley need to refer back to Hematology, so that we have guidelines with her rare disorder. Not ill appearing

## 2012-02-07 NOTE — Assessment & Plan Note (Signed)
Chronic mylagia, no diagnosis, ? Fibromyaglia, check ESR, ANA, CRP with previous rare disorder

## 2012-02-08 NOTE — Addendum Note (Signed)
Addended by: Milinda Antis F on: 02/08/2012 05:13 PM   Modules accepted: Orders

## 2012-02-09 ENCOUNTER — Telehealth: Payer: Self-pay | Admitting: Family Medicine

## 2012-02-09 NOTE — Telephone Encounter (Signed)
Pt aware.

## 2012-02-10 ENCOUNTER — Telehealth: Payer: Self-pay

## 2012-02-10 MED ORDER — AMITRIPTYLINE HCL 10 MG PO TABS: 10.0000 mg | ORAL_TABLET | Freq: Every day | ORAL | Status: DC

## 2012-02-10 NOTE — Telephone Encounter (Signed)
Pt aware.

## 2012-02-10 NOTE — Telephone Encounter (Signed)
She has been aching all over her body- she hasn't even gotten out of bed today. She is concerned about it being fibromyalgia and she doesn't know what to do about the pain. She has too much to do to have to lay around all day. Also had a bad headache yesterday in the back of her head. Please advise

## 2012-02-10 NOTE — Telephone Encounter (Signed)
Unfortunately these are chronic symptoms, Her labs were normal for the muscles. I will start her on amitriptyline at bedtime. TYlenol for headache I have not reviewed or received any of her records so I have no further information for her She needs to keep appt with hematologist  She can schedule f/u appt in 6 weeks after being on medication

## 2012-02-19 ENCOUNTER — Telehealth: Payer: Self-pay | Admitting: Family Medicine

## 2012-02-19 NOTE — Telephone Encounter (Signed)
She needs to make an appointment Try to put her in for Monday

## 2012-02-19 NOTE — Telephone Encounter (Signed)
Pt aware. Luann to schedule

## 2012-02-19 NOTE — Telephone Encounter (Signed)
Using tylenlol and amitriptyline. Still hurting all over. Can't sleep, can't get comfortable and is in pain all the time. Rates the pain at an 8. Gets worse at night.

## 2012-02-22 ENCOUNTER — Ambulatory Visit (INDEPENDENT_AMBULATORY_CARE_PROVIDER_SITE_OTHER): Payer: BC Managed Care – PPO | Admitting: Family Medicine

## 2012-02-22 ENCOUNTER — Encounter: Payer: Self-pay | Admitting: Family Medicine

## 2012-02-22 VITALS — BP 130/80 | HR 80 | Resp 18 | Ht 62.5 in | Wt 193.0 lb

## 2012-02-22 DIAGNOSIS — IMO0001 Reserved for inherently not codable concepts without codable children: Secondary | ICD-10-CM

## 2012-02-22 DIAGNOSIS — M791 Myalgia, unspecified site: Secondary | ICD-10-CM

## 2012-02-22 MED ORDER — TRAMADOL HCL 50 MG PO TABS: ORAL_TABLET | ORAL | Status: DC

## 2012-02-22 MED ORDER — TRAMADOL HCL 50 MG PO TABS: 50.0000 mg | ORAL_TABLET | Freq: Four times a day (QID) | ORAL | Status: DC | PRN

## 2012-02-22 MED ORDER — AMITRIPTYLINE HCL 25 MG PO TABS: 25.0000 mg | ORAL_TABLET | Freq: Every day | ORAL | Status: DC

## 2012-02-22 NOTE — Progress Notes (Signed)
  Subjective:    Patient ID: [name], female    DOB: [birth year], 46 y.o.   MRN: [id number]  HPI  Patient presents to followup muscle aches and myalgias. She was prescribed amitriptyline after she called in stating that her pain was very severe she could not get out of.. She's currently on 10 mg and has been on this for approximately 2 weeks. She's not felt any change with the medication. I reviewed her records from [facility] there was no mention of any myalgias for the past 3 years. There is also no mention in her endocrinology notes. 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 Months she feels like she has the flu all the time also gets headaches and upset stomach. She's joint pain and bilateral index fingers at times but no swelling. She is here today with her mother who has psoriatic arthritis  Review of Systems  GEN- + fatigue, fever, weight loss,weakness, recent illness HEENT- denies eye drainage, change in vision, nasal discharge, CVS- denies chest pain, palpitations RESP- denies SOB, cough, wheeze ABD- denies N/V, change in stools, abd pain GU- denies dysuria, hematuria, dribbling, incontinence MSK- +joint pain,+ muscle aches, injury Neuro- + headache, denies dizziness, syncope, seizure activity      Objective:   Physical Exam GEN- NAD, alert and oriented x3 HEENT- PERRL, EOMI, non injected sclera, pink conjunctiva, MMM, oropharynx clear Neck- Supple, stiff ROM, TTP posterior neck CVS- RRR, no murmur RESP-CTAB EXT- No edema, TTP on lower, and upper ext, back, thighs no swelling, no joint swelling , decreased ROM at shoulder bilat  Pulses- Radial, DP- 2+ Psych-normal affect and mood  Neuro- CNII-XII intact, no focal deficits Skin- no rash      Assessment & Plan:

## 2012-02-22 NOTE — Patient Instructions (Addendum)
Referral to rheumatologist Keep appt with hematologist New pain medication Increase Amitriptyline to total of 25mg  at bedtime ( 2.5 tablets)  Keep the previous appointment

## 2012-02-22 NOTE — Assessment & Plan Note (Signed)
Normal ESR, CRP, ANA, labs otherwise unremarkable Based on her history I am concerned for fibromyalgia, however the decreased ROM in upper ext is concerning.  Will have her increase amitryptiline to 25mg  QHS. I will add tramadol for severe pain for now, advised she will not be on chronic pain medications as this does not treat myalgias  Refer to rheumatology

## 2012-02-27 NOTE — Progress Notes (Signed)
REVIEWED.  

## 2012-02-29 ENCOUNTER — Encounter (HOSPITAL_COMMUNITY): Payer: Self-pay | Admitting: Oncology

## 2012-02-29 ENCOUNTER — Encounter (HOSPITAL_COMMUNITY): Payer: BC Managed Care – PPO | Attending: Oncology | Admitting: Oncology

## 2012-02-29 VITALS — BP 128/75 | HR 90 | Temp 98.5°F | Resp 16 | Ht 62.0 in | Wt 197.0 lb

## 2012-02-29 DIAGNOSIS — R799 Abnormal finding of blood chemistry, unspecified: Secondary | ICD-10-CM

## 2012-02-29 DIAGNOSIS — Z6835 Body mass index (BMI) 35.0-35.9, adult: Secondary | ICD-10-CM | POA: Insufficient documentation

## 2012-02-29 DIAGNOSIS — E119 Type 2 diabetes mellitus without complications: Secondary | ICD-10-CM | POA: Insufficient documentation

## 2012-02-29 DIAGNOSIS — D473 Essential (hemorrhagic) thrombocythemia: Secondary | ICD-10-CM | POA: Insufficient documentation

## 2012-02-29 DIAGNOSIS — D72829 Elevated white blood cell count, unspecified: Secondary | ICD-10-CM | POA: Insufficient documentation

## 2012-02-29 DIAGNOSIS — I10 Essential (primary) hypertension: Secondary | ICD-10-CM | POA: Insufficient documentation

## 2012-02-29 DIAGNOSIS — E669 Obesity, unspecified: Secondary | ICD-10-CM | POA: Insufficient documentation

## 2012-02-29 LAB — CBC WITH DIFFERENTIAL/PLATELET
Basophils Absolute: 0 10*3/uL (ref 0.0–0.1)
Basophils Relative: 0 % (ref 0–1)
Eosinophils Absolute: 0.3 10*3/uL (ref 0.0–0.7)
Eosinophils Relative: 2 % (ref 0–5)
HCT: 43.3 % (ref 36.0–46.0)
Hemoglobin: 15 g/dL (ref 12.0–15.0)
Lymphocytes Relative: 40 % (ref 12–46)
Lymphs Abs: 5.6 10*3/uL — ABNORMAL HIGH (ref 0.7–4.0)
MCH: 33.3 pg (ref 26.0–34.0)
MCHC: 34.6 g/dL (ref 30.0–36.0)
MCV: 96.2 fL (ref 78.0–100.0)
Monocytes Absolute: 1.8 10*3/uL — ABNORMAL HIGH (ref 0.1–1.0)
Monocytes Relative: 13 % — ABNORMAL HIGH (ref 3–12)
Neutro Abs: 6.3 10*3/uL (ref 1.7–7.7)
Neutrophils Relative %: 45 % (ref 43–77)
Platelets: 456 10*3/uL — ABNORMAL HIGH (ref 150–400)
RBC: 4.5 MIL/uL (ref 3.87–5.11)
RDW: 14.3 % (ref 11.5–15.5)
WBC: 14 10*3/uL — ABNORMAL HIGH (ref 4.0–10.5)

## 2012-02-29 NOTE — Progress Notes (Signed)
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 difficile diarrhea x2 Problem #8 aching in her muscles, and occasional joints and fatigue. She is to see Dr. Kellie Simmering in the near future to rule out fibromyalgia The patient is a pleasant 46 year old woman who I last saw on 08/27/2008 for followup. We have not seen her since then. After her splenectomy she did have a postoperative splenic vein thromboses treated with Coumadin for 6 months. She was hospitalized for the C. difficile diarrhea in May of this year she tells me. Since being given the vancomycin she states it has resolved. Other than this infection with a sinusitis she has not been bothered by repetitive infections. Her weight has gone up since I last saw approximately 11 pounds. She now has a 49 year old daughter and her 2 other children by her prior marriage are now grown in gone  and she even has one grandchild. She is not working presently. She states she walks 2 miles per day. She does not smoke and does not drink. She has never met with the diabetic dietitian since she was diagnosed in 2008 however. I do want set that up for her. She clearly needs to lose weight. BP 128/75  Pulse 90  Temp 98.5 F (36.9 C) (Oral)  Resp 16  Ht 5\' 2"  (1.575 m)  Wt 197 lb (89.359 kg)  BMI 36.03 kg/m2 Physical exam shows that she is alert and oriented. His no lymphadenopathy or thyromegaly my opinion. Breast exam is negative for masses. She has not had a gynecological exam she states since her baby was born. Lungs are clear. Heart  shows a regular rhythm and rate without murmur rub or gallop. Liver is not palpably enlarged. Her abdominal scar is well healed. Bowel sounds are diminished but present. She has no peripheral edema of the arms or legs. HEENT exam is unremarkable. She has sensitivity of her muscles on exam.  We will repeat a blood count and we'll look at her blood smear but I do not anticipate it being anything but reactive leukocytosis at this point in time. I do want her here in a year to make sure we followup with the fact that she needs her vaccinations once again. She will need a pneumonia vaccine, Haemophilus influenza-type B. vaccine, and meningococcal vaccine. She can certainly have these done by her primary care physician if she so desires. I've also discussed with her that if she cannot lose weight and manage her diabetes easily that some day she may want to consider bariatric surgery. She was not keen on this idea but I discussed with her in detail the reasoning behind it since she already has fatty infiltration of her liver seen on a recent CT scan along with her hypertension, diabetes mellitus, hypercholesterolemia.

## 2012-02-29 NOTE — Patient Instructions (Addendum)
[facility]  Discharge Instructions  RECOMMENDATIONS MADE BY THE CONSULTANT AND ANY TEST RESULTS WILL BE SENT TO YOUR REFERRING DOCTOR.   EXAM FINDINGS BY MD TODAY AND SIGNS AND SYMPTOMS TO REPORT TO CLINIC OR PRIMARY MD: exam and discussion by MD.  Does not think your elevated White Blood Count is anything to worry about.  We are going to do blood work today and MD will look at your blood smear.  If there's any concerns we will be in touch with you.  MEDICATIONS PRESCRIBED: none   INSTRUCTIONS GIVEN AND DISCUSSED: Other :  Report recurring infections, night sweats, etc.  SPECIAL INSTRUCTIONS/FOLLOW-UP: Lab work Needed today and Return to Clinic in 1 year.   I acknowledge that I have been informed and understand all the instructions given to me and received a copy. I do not have any more questions at this time, but understand that I may call the Specialty Clinic at [facility] at [phone] during business hours should I have any further questions or need assistance in obtaining follow-up care.    __________________________________________  _____________  __________ Signature of Patient or Authorized Representative            Date                   Time    __________________________________________ Nurse's Signature

## 2012-02-29 NOTE — Progress Notes (Signed)
[name] presented for [employer]. Labs per MD order drawn via Peripheral Line 23 gauge needle inserted in left AC  Good blood return present. Procedure without incident.  Needle removed intact. Patient tolerated procedure well.

## 2012-03-01 ENCOUNTER — Telehealth (HOSPITAL_COMMUNITY): Payer: Self-pay | Admitting: Dietician

## 2012-03-01 ENCOUNTER — Telehealth (HOSPITAL_COMMUNITY): Payer: Self-pay

## 2012-03-01 NOTE — Telephone Encounter (Signed)
Mailed appointment confirmation letter and instructions for appointment via US Mail.  

## 2012-03-01 NOTE — Telephone Encounter (Signed)
Message left for patient to call clinic regarding getting vaccines in March.

## 2012-03-01 NOTE — Telephone Encounter (Signed)
Appointment scheduled for 03/04/12 at 10:00 AM.

## 2012-03-01 NOTE — Telephone Encounter (Signed)
Message copied by Evelena Leyden on Tue Mar 01, 2012  9:37 AM ------      Message from: Mariel Sleet, ERIC S      Created: Tue Mar 01, 2012  9:09 AM      Regarding: RE: Vaccines       March would be ideal but any time next year is ok probably      Just set up for March if she is ok with that      ----- Message -----         From: Evelena Leyden, RN         Sent: 02/29/2012   6:09 PM           To: Randall An, MD      Subject: Vaccines                                                 [name] said it will be 5 years next March since she had her splenectomy and wants to know if she can get the vaccines that she needs to repeat when she comes back in November of 2014 or does she need to get them done in March 2014.

## 2012-03-01 NOTE — Telephone Encounter (Signed)
Message copied by Evelena Leyden on Tue Mar 01, 2012  3:10 PM ------      Message from: Mariel Sleet, ERIC S      Created: Tue Mar 01, 2012  9:09 AM      Regarding: RE: Vaccines       March would be ideal but any time next year is ok probably      Just set up for March if she is ok with that      ----- Message -----         From: Evelena Leyden, RN         Sent: 02/29/2012   6:09 PM           To: Randall An, MD      Subject: Vaccines                                                 Elishia said it will be 5 years next March since she had her splenectomy and wants to know if she can get the vaccines that she needs to repeat when she comes back in November of 2014 or does she need to get them done in March 2014.

## 2012-03-01 NOTE — Telephone Encounter (Signed)
Received referral via fax from [facility] for dx: obesity, diabetes.

## 2012-03-01 NOTE — Telephone Encounter (Signed)
Patient notified and she will call us in February to get the vaccines scheduled for March.

## 2012-03-04 ENCOUNTER — Encounter (HOSPITAL_COMMUNITY): Payer: Self-pay | Admitting: Dietician

## 2012-03-04 NOTE — Progress Notes (Signed)
Outpatient Initial Nutrition Assessment  Date:03/04/2012   Appt Start Time: 0952  Referring Physician: Dr. Mariel Sleet ([facility]) Reason for Visit: diabetes  PCP is Dr, Jeanice Lim  Nutrition Assessment:  Height: 5\' 2"  (157.5 cm)   Weight: 195 lb (88.451 kg)   IBW: 110#        %IBW: 177% UBW: 170# %UBW: 115% Body mass index is 35.67 kg/(m^2). Classified as obesity, class II.  Goal Weight: 175# (10% weight loss) Weight hx: Pt reports lowest weight was 120# at age 46, when she got married. Pt has been maintaining wt of 170# for the past 5-10 years. She reports recent weight gain (past 3-10 months) due to decreased physical activity, due to illness and possible fibromyalgia as well as the illness ans recent death of her father.   Estimated nutritional needs: 1731-1889 kcals daily, 71-89 grams protein daily, 1.7-1.9 L fluid daily  PMH:  Past Medical History  Diagnosis Date  . Diabetes mellitus   . Hypertension   . High cholesterol   . Clostridium difficile colitis   . Sea-blue histiocyte syndrome   . Leukocytosis     Dr Mariel Sleet    Medications:  Current Outpatient Rx  Name  Route  Sig  Dispense  Refill  . AMITRIPTYLINE HCL 25 MG PO TABS   Oral   Take 1 tablet (25 mg total) by mouth at bedtime.   30 tablet   3     New dose   . IBUPROFEN 200 MG PO TABS   Oral   Take 600 mg by mouth every 6 (six) hours as needed. For pain         . INSULIN GLARGINE 100 UNIT/ML [place] SOLN   Subcutaneous   Inject 60 Units into the skin at bedtime.         . INSULIN GLULISINE 100 UNIT/ML [place] SOLN   Subcutaneous   Inject into the skin 3 (three) times daily before meals. As directed per sliding scale : 90-150= 15 units. *Increase insulin injection by 1 unit for every 50 units levels are increased*         . LIRAGLUTIDE 18 MG/3ML  SOLN   Subcutaneous   Inject 1.2 mg into the skin at bedtime.         Marland Kitchen LISINOPRIL 20 MG PO TABS   Oral   Take 20 mg by mouth daily.         Marland Kitchen  OMEPRAZOLE 20 MG PO CPDR   Oral   Take 20 mg by mouth 2 (two) times daily.         Marland Kitchen PRAVASTATIN SODIUM 20 MG PO TABS   Oral   Take 40 mg by mouth daily.          Marland Kitchen PROBIOTIC DAILY PO   Oral   Take 1 tablet by mouth as needed.          Marland Kitchen TRAMADOL HCL 50 MG PO TABS      Take 1-2 tablets every 6 hours as needed   60 tablet   1   . VITAMIN D (ERGOCALCIFEROL) 50000 UNITS PO CAPS   Oral   Take 50,000 Units by mouth every 7 (seven) days.           Labs: CMP     Component Value Date/Time   NA 136 01/19/2012 1920   NA 138 01/07/2012 1631   K 3.9 01/19/2012 1920   K 4.4 01/07/2012 1631   CL 98 01/19/2012 1920  CL 100 01/07/2012 1631   CO2 28 01/19/2012 1920   GLUCOSE 116* 01/19/2012 1920   BUN 8 01/19/2012 1920   BUN 12 01/07/2012 1631   CREATININE 0.55 01/19/2012 1920   CREATININE 0.61 01/07/2012 1631   CALCIUM 9.9 01/19/2012 1920   CALCIUM 9.7 01/07/2012 1631   PROT 7.7 01/19/2012 1920   PROT 7.9 01/07/2012 1631   ALBUMIN 3.8 01/19/2012 1920   AST 51* 01/19/2012 1920   AST 88 01/07/2012 1631   ALT 63* 01/19/2012 1920   ALKPHOS 83 01/19/2012 1920   ALKPHOS 89 01/07/2012 1631   BILITOT 0.4 01/19/2012 1920   BILITOT 0.4 01/07/2012 1631   GFRNONAA >90 01/19/2012 1920   GFRAA >90 01/19/2012 1920    Lipid Panel  No results found for this basename: chol, trig, hdl, cholhdl, vldl, ldlcalc     Lab Results  Component Value Date   HGBA1C 11.2* 01/07/2012   HGBA1C 8.9* 08/06/2011   HGBA1C  Value: 6.3 (NOTE)   The ADA recommends the following therapeutic goals for glycemic   control related to Hgb A1C measurement:   Goal of Therapy:   < 7.0% Hgb A1C   Action Suggested:  > 8.0% Hgb A1C   Ref:  Diabetes Care, 22, Suppl. 1, 1999* 07/06/2007   Lab Results  Component Value Date   CREATININE 0.55 01/19/2012     Lifestyle/ social habits: [name] lives in  [place]) with her husband and 37 year old daughter. She is a stay at home mom. She has adult children (ages 23 and 75) who  reside in [place] and [place] and a 20 month old grandson from her eldest child. She reports her stress level as 2-3, citing she does not have much stress. She reports that she has been sedentary the past few months, due to pain; she believes she may have fibromyalgia. Despite her difficulties, she walks on her treadmill 2-3 times per week for 45 minutes to 1 hour (90-180 minutes physical activity per week).   Nutrition hx/habits: [name] was diagnosed with diabetes in 2008. She has never received diabetes education. She reports no recent changes to her diet. She does not like bread and sweets. She reports allergies to artifical sweeteners. She checks her glucose levels 4 times daily and reports variable ranges between 70-300's. She reports AM fasting readings between 130-150's; highest readings are achieved in the evening hours. She reports she has always had difficulty controlling her blood sugars "no matter what I do".  Review of Hgb A1c indicated good control in 2009, but very poor control over the past 6 months. She reports that she was on oral agents when she was first diagnosed and was recently switched to Victoza and sliding scale Insulin. She drinks mostly water, but admits to "sipping on" 1 20 oz regular Dr. Reino Kent daily. She also admits to skipping meals. She reports she has never been on a set meal schedule. She prepares most of her food at home. She rarely fries food.   Diet recall: Breakfast: skips 2-3 times per week, if she does eat: 2% milk, Post cereal (honey, cranberry, almonds); Lunch: skips 2-3 times per week, if she does eat: salad of sandwich; Dinner (5-5:30 PM): baked chikcen, tossed salad, corn on the cob; Snack: small bag of popcorn or apple  Nutrition Diagnosis: Inconsistent carbohydrate intake r/t disordered eating pattern AEB diet recall, Hgb A1c: 11.2.  Nutrition Intervention: Nutrition rx: 1500 kcal NAS, diabetic diet; limit 1 starch per meal; low calorie  beverages only;  physical activity as tolerated (goal of 2.5 hours per week)  Education/Counseling Provided: Educated pt on principles of diabetic diet. Discussed carbohydrate metabolism in relation to diabetes. Educated pt on plate method, portion sizes, and sources of carbohydrate. Discussed importance of regular meal pattern. Discussed importance of adding sources of whole grains to diet to improve glycemic control. Also encouraged to choose low fat dairy, lean meats, and whole fruits and vegetables more often. Educated on food label reading. Discussed nutritional content of foods commonly eaten and discussed healthier alternatives. Discussed importance of compliance with diet and self management practices to prevent further complications of disease. Educated pt on importance of physical activity (goal of at least 30 minutes 5 times per week) along with a healthy diet to achieve weight loss and glycemic goals. Encouraged slow, moderate weight loss of 1-2# per week, or 7-10% of current body weight. Provided plate method and "Go With The Whole Grain" handouts. Used TeachBack to assess understanding.   Understanding, Motivation, Ability to Follow Recommendations: Expect fair compliance. Pt appears hesitant to make changes.   Monitoring and Evaluation: Goals: 1) 1-2# weight loss per week; 2) Hgb A1c < 7.0; 3) 3 meals per day; 4) 2.5 hours physical activity per week  Recommendations: 1) For weight loss: 1231-1339 kcals daily; 2) Break up exercise into smaller, more frequent sessions; 3) Try to eat meals around the same time each day; 4) Develop a daily schedule  F/U: PRN. Provided RD contact information.   Melody Haver, RD, LDN 03/04/2012  Appt EndTime: 1106

## 2012-04-12 LAB — COMPREHENSIVE METABOLIC PANEL
ALT: 38 U/L — AB (ref 7–35)
AST: 28 U/L
Albumin: 4
Alkaline Phosphatase: 110 U/L
BUN: 8 mg/dL (ref 4–21)
Calcium: 9.3 mg/dL
Chloride: 104 mmol/L
Creat: 0.6
Globulin: 3.4
Hgb A1c MFr Bld: 9.8 % — AB (ref 4.0–6.0)
Potassium: 4.3 mmol/L
Sodium: 140 mmol/L (ref 137–147)
Total Bilirubin: 0.3 mg/dL
Total Protein: 7.4 g/dL

## 2012-04-25 NOTE — Progress Notes (Signed)
Quick Note:  Reviewed from Dr Isidoro Donning office ______

## 2012-05-06 ENCOUNTER — Ambulatory Visit: Payer: BC Managed Care – PPO | Admitting: Family Medicine

## 2012-05-09 ENCOUNTER — Ambulatory Visit (INDEPENDENT_AMBULATORY_CARE_PROVIDER_SITE_OTHER): Payer: BC Managed Care – PPO | Admitting: Family Medicine

## 2012-05-09 ENCOUNTER — Encounter: Payer: Self-pay | Admitting: Family Medicine

## 2012-05-09 ENCOUNTER — Telehealth: Payer: Self-pay | Admitting: Family Medicine

## 2012-05-09 VITALS — BP 122/82 | HR 90 | Resp 16 | Ht 62.5 in | Wt 199.0 lb

## 2012-05-09 DIAGNOSIS — IMO0002 Reserved for concepts with insufficient information to code with codable children: Secondary | ICD-10-CM

## 2012-05-09 DIAGNOSIS — Q8909 Congenital malformations of spleen: Secondary | ICD-10-CM

## 2012-05-09 DIAGNOSIS — E7800 Pure hypercholesterolemia, unspecified: Secondary | ICD-10-CM

## 2012-05-09 DIAGNOSIS — M797 Fibromyalgia: Secondary | ICD-10-CM

## 2012-05-09 DIAGNOSIS — E1165 Type 2 diabetes mellitus with hyperglycemia: Secondary | ICD-10-CM

## 2012-05-09 DIAGNOSIS — I10 Essential (primary) hypertension: Secondary | ICD-10-CM

## 2012-05-09 DIAGNOSIS — Q8901 Asplenia (congenital): Secondary | ICD-10-CM

## 2012-05-09 DIAGNOSIS — IMO0001 Reserved for inherently not codable concepts without codable children: Secondary | ICD-10-CM

## 2012-05-09 DIAGNOSIS — E785 Hyperlipidemia, unspecified: Secondary | ICD-10-CM

## 2012-05-09 DIAGNOSIS — Z23 Encounter for immunization: Secondary | ICD-10-CM

## 2012-05-09 LAB — LIPID PANEL
Cholesterol: 174 mg/dL (ref 0–200)
HDL: 43 mg/dL (ref >39)
LDL Cholesterol: 113 mg/dL — ABNORMAL HIGH (ref 0–99)
Total CHOL/HDL Ratio: 4 Ratio
Triglycerides: 89 mg/dL (ref <150)
VLDL: 18 mg/dL (ref 0–40)

## 2012-05-09 MED ORDER — ALPRAZOLAM 1 MG PO TABS: 1.0000 mg | ORAL_TABLET | Freq: Two times a day (BID) | ORAL | Status: DC | PRN

## 2012-05-09 MED ORDER — LISINOPRIL 5 MG PO TABS: 5.0000 mg | ORAL_TABLET | Freq: Every day | ORAL | Status: DC

## 2012-05-09 MED ORDER — PREGABALIN 75 MG PO CAPS: 75.0000 mg | ORAL_CAPSULE | Freq: Two times a day (BID) | ORAL | Status: DC

## 2012-05-09 NOTE — Progress Notes (Signed)
  Subjective:    Patient ID: [name], female    DOB: 1965/05/18, 47 y.o.   MRN: [id number]  HPI Patient to follow chronic medical problems. She was seen by oncology secondary to her leukocytosis with her blue histiocytosis disease it was recommended that she had pneumonia vaccine Haemophilus influenza B and menigoccocal vaccine Diabetes mellitus her last A1c was 9.8% which is an improvement. She is now on 70/3040 units twice a day. She is followed by endocrinology her blood sugars fasting have been one 160-170 She was evaluated by rheumatology diagnosis of fibromyalgia confirmed. She was advised her Flexeril and Xanax the Flexeril makes her very sleepy and often she cannot get up in the morning she often has to take Xanax 0.5 mg 3 times a day because it wears off. She continues to have pain all over all for muscles    Review of Systems  GEN- denies fatigue, fever, weight loss,weakness, recent illness HEENT- denies eye drainage, change in vision, nasal discharge, CVS- denies chest pain, palpitations RESP- denies SOB, cough, wheeze ABD- denies N/V, change in stools, abd pain GU- denies dysuria, hematuria, dribbling, incontinence MSK- denies joint pain, +muscle aches, injury Neuro- denies headache, dizziness, syncope, seizure activity      Objective:   Physical Exam GEN- NAD, alert and oriented x3 HEENT- PERRL, EOMI, non injected sclera, pink conjunctiva, MMM, oropharynx clear Neck- Supple,  CVS- RRR, no murmur RESP-CTAB EXT- No edema Pulses- Radial, DP- 2+ Diabetic foot exam       Assessment & Plan:

## 2012-05-09 NOTE — Patient Instructions (Addendum)
We will call for her other immunizations  Start lisinopril 5 mg to help protect her kidney Get the cholesterol panel done fasting we will call with results and any changes your cholesterol medication He can stop the Flexeril Xanax has been increased to twice a day however the dose is 1mg  We will try to get Lyrica twice a day for your fibromyalgia please let us know if insurance does not cover this or your unable to afford this F/U 6 weeks

## 2012-05-10 NOTE — Telephone Encounter (Signed)
Med refilled.

## 2012-05-12 ENCOUNTER — Encounter: Payer: Self-pay | Admitting: Family Medicine

## 2012-05-12 DIAGNOSIS — Q8901 Asplenia (congenital): Secondary | ICD-10-CM | POA: Insufficient documentation

## 2012-05-12 DIAGNOSIS — M797 Fibromyalgia: Secondary | ICD-10-CM | POA: Insufficient documentation

## 2012-05-12 NOTE — Assessment & Plan Note (Signed)
Recheck FLP to see what dose of statin needed

## 2012-05-12 NOTE — Assessment & Plan Note (Signed)
Followed by endocrine, uncontrolled,hopefully things will improve since she can afford the insulin mix

## 2012-05-12 NOTE — Assessment & Plan Note (Signed)
Lisinopril 5mg , good control

## 2012-05-12 NOTE — Assessment & Plan Note (Addendum)
D/c flexeril , start lyrica, continue xanax increase to 1mg  BID, reviewed rheumatology

## 2012-05-17 ENCOUNTER — Encounter: Payer: Self-pay | Admitting: Family Medicine

## 2012-05-17 ENCOUNTER — Ambulatory Visit (INDEPENDENT_AMBULATORY_CARE_PROVIDER_SITE_OTHER): Payer: BC Managed Care – PPO | Admitting: Family Medicine

## 2012-05-17 ENCOUNTER — Telehealth: Payer: Self-pay | Admitting: Family Medicine

## 2012-05-17 VITALS — BP 108/80 | HR 88 | Temp 98.4°F | Resp 18 | Ht 62.5 in | Wt 199.0 lb

## 2012-05-17 DIAGNOSIS — J069 Acute upper respiratory infection, unspecified: Secondary | ICD-10-CM

## 2012-05-17 MED ORDER — AZITHROMYCIN 250 MG PO TABS: ORAL_TABLET | ORAL | Status: AC

## 2012-05-17 NOTE — Telephone Encounter (Signed)
Put her in at 1:45

## 2012-05-17 NOTE — Patient Instructions (Signed)
Plenty of fluids  Rest TYlenol or Motrin for fever Zpak sent Humidifier at night You can use Sudafed for 3 days  F.U as previous, call if you do not improve

## 2012-05-17 NOTE — Progress Notes (Signed)
  Subjective:    Patient ID: [name], female    DOB: Jul 30, 1965, 47 y.o.   MRN: [id number]  HPI Patient presents with cough sore throat sinus congestion and drainage for the past 5 days. She went to visit her 59-month-old and came back sick. There is no production with the cough. She had fever 101.4F last night. She is status post splenectomy she has had a pneumonia vaccine. No diarrhea, rash, vomiting   Review of Systems  GEN- + fatigue, +fever, weight loss,weakness, recent illness HEENT- denies eye drainage, change in vision,+ nasal discharge, CVS- denies chest pain, palpitations RESP- denies SOB, cough, wheeze ABD- denies N/V, change in stools, abd pain Neuro- + headache, dizziness, syncope, seizure activity        Objective:   Physical Exam GEN- NAD, alert and oriented x3, ill appearing HEENT- PERRL, EOMI, non injected sclera, pink conjunctiva, MMM, oropharynx + injection, TM clear bilat no effusion, + maxillary sinus tenderness, inflammed turbinates, + Nasal drainage  Neck- Supple, no LAD CVS- RRR, no murmur RESP-CTAB EXT- No edema Pulses- Radial 2+         Assessment & Plan:

## 2012-05-17 NOTE — Assessment & Plan Note (Signed)
This is most likely viral mediated however she isn't immunosuppressed state and she is status post splenectomy and she has uncontrolled diabetes mellitus. She's had a history of C. difficile after taking penicillins. I will put her on a Z-Pak she will take cough medicine and use Tylenol as needed for fever warm salt water gargles for the throat

## 2012-05-17 NOTE — Telephone Encounter (Signed)
Called patient and she is coming in

## 2012-06-13 ENCOUNTER — Ambulatory Visit (INDEPENDENT_AMBULATORY_CARE_PROVIDER_SITE_OTHER): Payer: BC Managed Care – PPO

## 2012-06-13 VITALS — BP 130/74 | Wt 201.0 lb

## 2012-06-13 DIAGNOSIS — Z9229 Personal history of other drug therapy: Secondary | ICD-10-CM

## 2012-06-13 DIAGNOSIS — Z23 Encounter for immunization: Secondary | ICD-10-CM

## 2012-06-14 NOTE — Progress Notes (Signed)
Patient in for injections of Menactra and ActHib.  ActHib given in left deltoid.  No sign or symptom of adverse reaction noted.   Menactra given in right deltoid.  No sign or symptom of adverse reaction.

## 2012-06-20 ENCOUNTER — Encounter: Payer: Self-pay | Admitting: Family Medicine

## 2012-06-20 ENCOUNTER — Ambulatory Visit (INDEPENDENT_AMBULATORY_CARE_PROVIDER_SITE_OTHER): Payer: BC Managed Care – PPO | Admitting: Family Medicine

## 2012-06-20 VITALS — BP 132/76 | HR 76 | Resp 18 | Ht 62.5 in | Wt 201.0 lb

## 2012-06-20 DIAGNOSIS — I10 Essential (primary) hypertension: Secondary | ICD-10-CM

## 2012-06-20 DIAGNOSIS — Q8901 Asplenia (congenital): Secondary | ICD-10-CM

## 2012-06-20 DIAGNOSIS — M797 Fibromyalgia: Secondary | ICD-10-CM

## 2012-06-20 DIAGNOSIS — IMO0001 Reserved for inherently not codable concepts without codable children: Secondary | ICD-10-CM

## 2012-06-20 MED ORDER — PREGABALIN 150 MG PO CAPS: 150.0000 mg | ORAL_CAPSULE | Freq: Two times a day (BID) | ORAL | Status: DC

## 2012-06-20 NOTE — Patient Instructions (Signed)
Lyrica increased to 150mg  twice a day Change xanax to 1/2 tablet morning and mid-day, 1 tablet at bedtime Continue all other medications F/U 3 months

## 2012-06-20 NOTE — Progress Notes (Signed)
  Subjective:    Patient ID: [name], female    DOB: January 10, 1966, 47 y.o.   MRN: [id number]  HPI  Patient here to followup interim visit for fibromyalgia Lyrica was started at last visit 75 mg twice a day also she was changed to Xanax 1 mg twice a day. States she does not feel much different some day she feels like she has more pain. She's here with her husband today who does not understand the fibromyalgia. Previous upper respiratory infection has cleared. His difficulty sleeping because of pain and will keep her awake at night she often naps during the day because of severe fatigue she does try to get up and cleaning the house and couldn't dinner but is more now after this she has difficulty going to the grocery store because of fatigue and pain. She has recently applied for disability  Review of Systems  GEN- + fatigue, fever, weight loss,weakness, recent illness HEENT- denies eye drainage, change in vision, nasal discharge, CVS- denies chest pain, palpitations RESP- denies SOB, cough, wheeze ABD- denies N/V, change in stools, abd pain GU- denies dysuria, hematuria, dribbling, incontinence MSK- denies joint pain, +muscle aches, injury Neuro- denies headache, dizziness, syncope, seizure activity      Objective:   Physical Exam GEN- NAD, alert and oriented x3 HEENT- PERRL, EOMI, non injected sclera, pink conjunctiva, MMM, oropharynx clear Neck- Supple, no LAD CVS- RRR, no murmur RESP-CTAB EXT- No edema Pulses- Radial, DP- 2+        Assessment & Plan:

## 2012-06-20 NOTE — Assessment & Plan Note (Signed)
Blood pressure looks good, with addition of lisinopril for renal protection

## 2012-06-20 NOTE — Assessment & Plan Note (Signed)
Her fibromyalgia is still uncontrolled she's been on quite a few medications without any improvement in her symptoms. I would try to increase her Lyrica to 150 mg twice a day. She will use the Xanax a half a tablet morning and evening and one tablet at bedtime. Encouraged her to start walking on her treadmill goal 5 minutes a day. We also discussed massage and acupuncture his other ways to deal with the nerve pain and muscle aches with fibromyalgia. We also discussed pain clinic however she is unable to afford specialty visits at this time. We spent a few minutes discussing the process for disability as well, she understands I do not determine disability.

## 2012-06-23 ENCOUNTER — Encounter: Payer: Self-pay | Admitting: *Deleted

## 2012-07-25 ENCOUNTER — Encounter: Payer: Self-pay | Admitting: Family Medicine

## 2012-07-25 ENCOUNTER — Ambulatory Visit (INDEPENDENT_AMBULATORY_CARE_PROVIDER_SITE_OTHER): Payer: BC Managed Care – PPO | Admitting: Family Medicine

## 2012-07-25 VITALS — BP 98/68 | HR 96 | Temp 98.7°F | Resp 18 | Ht 62.5 in | Wt 200.1 lb

## 2012-07-25 DIAGNOSIS — J209 Acute bronchitis, unspecified: Secondary | ICD-10-CM

## 2012-07-25 DIAGNOSIS — J019 Acute sinusitis, unspecified: Secondary | ICD-10-CM

## 2012-07-25 DIAGNOSIS — R42 Dizziness and giddiness: Secondary | ICD-10-CM

## 2012-07-25 MED ORDER — ALBUTEROL SULFATE HFA 108 (90 BASE) MCG/ACT IN AERS: 2.0000 | INHALATION_SPRAY | Freq: Four times a day (QID) | RESPIRATORY_TRACT | Status: DC | PRN

## 2012-07-25 MED ORDER — AZITHROMYCIN 250 MG PO TABS: ORAL_TABLET | ORAL | Status: DC

## 2012-07-25 MED ORDER — AZITHROMYCIN 250 MG PO TABS: ORAL_TABLET | ORAL | Status: AC

## 2012-07-25 NOTE — Assessment & Plan Note (Signed)
Due to sinusitis, illness and hypotension Hold BP medications

## 2012-07-25 NOTE — Patient Instructions (Signed)
Hold the lisinopril - see if dizziness improves  Take the antibiotics and mucinex  Continue insulin  Albuterol as needed for wheezing F/U as previous

## 2012-07-25 NOTE — Progress Notes (Signed)
  Subjective:    Patient ID: [name], female    DOB: 12-17-1965, 47 y.o.   MRN: [id number]  HPI  Patient presents with sinusitis with drainage subjective fever cough with wheezing at night fatigue and body aches. Does not occur for the past 2 weeks. She also notices she has been dizzy with standing on and off for the past 2 weeks as well. She is being followed by endocrinology her blood sugars have improved they have been less than 200 throughout the day.  Review of Systems  GEN- + fatigue, fever, weight loss,weakness, recent illness HEENT- denies eye drainage, change in vision,+ nasal discharge, CVS- denies chest pain, palpitations RESP- denies SOB, +cough, wheeze ABD- denies N/V, change in stools, abd pain GU- denies dysuria, hematuria, dribbling, incontinence MSK- denies joint pain, muscle aches, injury Neuro- denies headache, dizziness, syncope, seizure activity      Objective:   Physical Exam  GEN- NAD, alert and oriented x3, ill appearing HEENT- PERRL, EOMI, non injected sclera, pink conjunctiva, MMM, oropharynx mild injection, TM clear bilat no effusion,  + maxillary sinus tenderness, inflammed turbinates,  Nasal drainage  Neck- Supple, no LAD CVS- RRR, no murmur RESP- few scattered wheeze, clears with cough, upper airway congestion EXT- No edema Pulses- Radial 2+        Assessment & Plan:

## 2012-07-25 NOTE — Assessment & Plan Note (Signed)
Antibiotics per above

## 2012-07-25 NOTE — Assessment & Plan Note (Signed)
She is s/p immunizations but had had 2 illness this winter Due to history with CDiff, will treat with Zpak again, pt has tolerated Albuterol prn Mucinex DM given in offce

## 2012-07-27 ENCOUNTER — Encounter: Payer: Self-pay | Admitting: Internal Medicine

## 2012-08-17 ENCOUNTER — Ambulatory Visit: Payer: BC Managed Care – PPO | Admitting: Gastroenterology

## 2012-08-25 ENCOUNTER — Telehealth: Payer: Self-pay | Admitting: Family Medicine

## 2012-08-25 ENCOUNTER — Other Ambulatory Visit: Payer: Self-pay

## 2012-08-25 MED ORDER — ALPRAZOLAM 1 MG PO TABS: 1.0000 mg | ORAL_TABLET | Freq: Two times a day (BID) | ORAL | Status: DC | PRN

## 2012-08-25 NOTE — Telephone Encounter (Signed)
rx printed and awaiting signature.  

## 2012-08-29 ENCOUNTER — Telehealth: Payer: Self-pay | Admitting: Family Medicine

## 2012-08-29 MED ORDER — LISINOPRIL 5 MG PO TABS: 5.0000 mg | ORAL_TABLET | Freq: Every day | ORAL | Status: DC

## 2012-08-29 MED ORDER — PREGABALIN 150 MG PO CAPS: 150.0000 mg | ORAL_CAPSULE | Freq: Two times a day (BID) | ORAL | Status: DC

## 2012-08-29 NOTE — Telephone Encounter (Signed)
meds sent in to [facility]

## 2012-09-05 ENCOUNTER — Ambulatory Visit: Payer: BC Managed Care – PPO | Admitting: Family Medicine

## 2012-09-05 ENCOUNTER — Ambulatory Visit (INDEPENDENT_AMBULATORY_CARE_PROVIDER_SITE_OTHER): Payer: BC Managed Care – PPO | Admitting: Family Medicine

## 2012-09-05 ENCOUNTER — Encounter: Payer: Self-pay | Admitting: Family Medicine

## 2012-09-05 VITALS — BP 114/80 | HR 86 | Resp 18 | Ht 62.5 in | Wt 193.1 lb

## 2012-09-05 DIAGNOSIS — M797 Fibromyalgia: Secondary | ICD-10-CM

## 2012-09-05 DIAGNOSIS — I10 Essential (primary) hypertension: Secondary | ICD-10-CM

## 2012-09-05 DIAGNOSIS — IMO0001 Reserved for inherently not codable concepts without codable children: Secondary | ICD-10-CM

## 2012-09-05 DIAGNOSIS — R5381 Other malaise: Secondary | ICD-10-CM

## 2012-09-05 DIAGNOSIS — R531 Weakness: Secondary | ICD-10-CM

## 2012-09-05 DIAGNOSIS — F4321 Adjustment disorder with depressed mood: Secondary | ICD-10-CM

## 2012-09-05 DIAGNOSIS — E1165 Type 2 diabetes mellitus with hyperglycemia: Secondary | ICD-10-CM

## 2012-09-05 DIAGNOSIS — W19XXXA Unspecified fall, initial encounter: Secondary | ICD-10-CM

## 2012-09-05 DIAGNOSIS — IMO0002 Reserved for concepts with insufficient information to code with codable children: Secondary | ICD-10-CM

## 2012-09-05 MED ORDER — CYCLOBENZAPRINE HCL 10 MG PO TABS: 10.0000 mg | ORAL_TABLET | Freq: Three times a day (TID) | ORAL | Status: DC | PRN

## 2012-09-05 NOTE — Progress Notes (Signed)
  Subjective:    Patient ID: [name], female    DOB: 20-Apr-1966, 47 y.o.   MRN: [id number]  HPI  Patient presents for interim followup. She was supposed to be seen next month however she is losing her insurance because she is separated from her husband and he dropped her for an insurance. She continues to deal with her fibromyalgia which her pain is not well-controlled. She's also noticed increasingly more weakness in her lower extremities as well as upper extremities she's also fallen 3 times she says she gets up and lose her balance and often cannot feel her legs. She denies any injury to the head or seizure activity. Her blood sugars have been much improved and she is following with endocrinology she is due to have blood work next week as well as an appointment  Review of Systems   GEN- + fatigue, fever, weight loss,weakness, recent illness HEENT- denies eye drainage, change in vision, nasal discharge, CVS- denies chest pain, palpitations RESP- denies SOB, cough, wheeze ABD- denies N/V, change in stools, abd pain GU- denies dysuria, hematuria, dribbling, incontinence MSK- + joint pain, +muscle aches, injury Neuro- denies headache, dizziness, syncope, seizure activity    Objective:   Physical Exam GEN- NAD, alert and oriented x3 HEENT- PERRL, EOMI, non injected sclera, pink conjunctiva, MMM, oropharynx clear Neck- Supple,  CVS- RRR, no murmur RESP-CTAB EXT- No edema Pulses- Radial, DP- 2+ MSK- TTP multiple trigger points, arms back, legs Neuro-CNII-XII intact, DTR- hyperactive patella and achilles reflex,antalgic gait, strength 4+/5 RLE compared to Left , ? Due to effort/pain, UE strength equal bilat 5/5 Psych- depressed affect, not overly anxious, good eye contact, no SI       Assessment & Plan:

## 2012-09-05 NOTE — Patient Instructions (Addendum)
If you are unable to afford the lyrica then decreased to 75mg  twice a day for 2 weeks, then 75mg  once a day then stop  Try the flexeril again Medications refilled MRI of brain to be done  F/U 3 months [employer] if able

## 2012-09-06 DIAGNOSIS — W19XXXA Unspecified fall, initial encounter: Secondary | ICD-10-CM | POA: Insufficient documentation

## 2012-09-06 DIAGNOSIS — R531 Weakness: Secondary | ICD-10-CM | POA: Insufficient documentation

## 2012-09-06 DIAGNOSIS — F4321 Adjustment disorder with depressed mood: Secondary | ICD-10-CM | POA: Insufficient documentation

## 2012-09-06 NOTE — Assessment & Plan Note (Signed)
Well controlled, even off meds, as she has DM, will have her take 2.5mg  of lisinopril for renal protection

## 2012-09-06 NOTE — Assessment & Plan Note (Signed)
Unfortunately she may not be able to continue with the Lyrica due to the cost. I've laid out a tapering schedule for her over the next month since she's not going to withdrawal from the medication. She states she'll get to help with sleep was Flexeril I did send Flexeril to be used at bedtime as needed, she will also continue her ativan

## 2012-09-06 NOTE — Assessment & Plan Note (Signed)
She is having a lot more difficulty since her separation from her husband I think she currently has some under lying depression with her fibromyalgia and chronic pain as well. She understands that she will need close followup and she cannot continue to see myself and she will go to the [facility] Department for continued care

## 2012-09-06 NOTE — Assessment & Plan Note (Signed)
She has a host of neurological symptoms mostly related to her fibromyalgia with aches and pains and joint aches she has been evaluated by rheumatology. 4 showing medications truly help with the exception of Lyrica which given her mild relief. I am concerned more about her continued weakness and now her falling episodes. I will obtain an MRI of the brain to rule out any evidence of TIA or stroke that we are missing as she does have hypertension as well as diabetes mellitus will also look for any signs of multiple sclerosis or any tumor

## 2012-09-06 NOTE — Assessment & Plan Note (Signed)
F/u with Endocrine, she has a good supply of insulin and her metformin Labs to be done next week

## 2012-09-07 ENCOUNTER — Telehealth: Payer: Self-pay | Admitting: Family Medicine

## 2012-09-07 NOTE — Telephone Encounter (Signed)
Authorzation [id number] expires 6.19.2014.  appointment at [facility] 5.23.14 be there at 9:45 no pep needed left message for patient to call back

## 2012-09-09 ENCOUNTER — Ambulatory Visit (HOSPITAL_COMMUNITY)
Admission: RE | Admit: 2012-09-09 | Discharge: 2012-09-09 | Disposition: A | Discharge status: Home / Self Care | Payer: BC Managed Care – PPO | Source: Ambulatory Visit | Attending: Family Medicine | Admitting: Family Medicine

## 2012-09-09 DIAGNOSIS — R42 Dizziness and giddiness: Secondary | ICD-10-CM | POA: Insufficient documentation

## 2012-09-09 DIAGNOSIS — R51 Headache: Secondary | ICD-10-CM | POA: Insufficient documentation

## 2012-09-09 DIAGNOSIS — R5381 Other malaise: Secondary | ICD-10-CM | POA: Insufficient documentation

## 2012-09-09 DIAGNOSIS — R531 Weakness: Secondary | ICD-10-CM

## 2012-09-09 DIAGNOSIS — W19XXXA Unspecified fall, initial encounter: Secondary | ICD-10-CM

## 2012-09-09 DIAGNOSIS — R5383 Other fatigue: Secondary | ICD-10-CM | POA: Insufficient documentation

## 2012-09-09 DIAGNOSIS — Z9181 History of falling: Secondary | ICD-10-CM | POA: Insufficient documentation

## 2012-09-10 ENCOUNTER — Emergency Department (HOSPITAL_COMMUNITY): Payer: BC Managed Care – PPO

## 2012-09-10 ENCOUNTER — Emergency Department (HOSPITAL_COMMUNITY)
Admission: EM | Admit: 2012-09-10 | Discharge: 2012-09-10 | Disposition: A | Discharge status: Home / Self Care | Payer: BC Managed Care – PPO | Attending: Emergency Medicine | Admitting: Emergency Medicine

## 2012-09-10 ENCOUNTER — Encounter (HOSPITAL_COMMUNITY): Payer: Self-pay | Admitting: *Deleted

## 2012-09-10 DIAGNOSIS — E1169 Type 2 diabetes mellitus with other specified complication: Secondary | ICD-10-CM | POA: Insufficient documentation

## 2012-09-10 DIAGNOSIS — S20212A Contusion of left front wall of thorax, initial encounter: Secondary | ICD-10-CM

## 2012-09-10 DIAGNOSIS — Z87891 Personal history of nicotine dependence: Secondary | ICD-10-CM | POA: Insufficient documentation

## 2012-09-10 DIAGNOSIS — Z862 Personal history of diseases of the blood and blood-forming organs and certain disorders involving the immune mechanism: Secondary | ICD-10-CM | POA: Insufficient documentation

## 2012-09-10 DIAGNOSIS — E7800 Pure hypercholesterolemia, unspecified: Secondary | ICD-10-CM | POA: Insufficient documentation

## 2012-09-10 DIAGNOSIS — Z8639 Personal history of other endocrine, nutritional and metabolic disease: Secondary | ICD-10-CM | POA: Insufficient documentation

## 2012-09-10 DIAGNOSIS — Z794 Long term (current) use of insulin: Secondary | ICD-10-CM | POA: Insufficient documentation

## 2012-09-10 DIAGNOSIS — W1809XA Striking against other object with subsequent fall, initial encounter: Secondary | ICD-10-CM | POA: Insufficient documentation

## 2012-09-10 DIAGNOSIS — R739 Hyperglycemia, unspecified: Secondary | ICD-10-CM

## 2012-09-10 DIAGNOSIS — S20219A Contusion of unspecified front wall of thorax, initial encounter: Secondary | ICD-10-CM | POA: Insufficient documentation

## 2012-09-10 DIAGNOSIS — Z8619 Personal history of other infectious and parasitic diseases: Secondary | ICD-10-CM | POA: Insufficient documentation

## 2012-09-10 DIAGNOSIS — Y9389 Activity, other specified: Secondary | ICD-10-CM | POA: Insufficient documentation

## 2012-09-10 DIAGNOSIS — Y9289 Other specified places as the place of occurrence of the external cause: Secondary | ICD-10-CM | POA: Insufficient documentation

## 2012-09-10 DIAGNOSIS — I10 Essential (primary) hypertension: Secondary | ICD-10-CM | POA: Insufficient documentation

## 2012-09-10 DIAGNOSIS — W19XXXA Unspecified fall, initial encounter: Secondary | ICD-10-CM

## 2012-09-10 DIAGNOSIS — R55 Syncope and collapse: Secondary | ICD-10-CM | POA: Insufficient documentation

## 2012-09-10 DIAGNOSIS — Z79899 Other long term (current) drug therapy: Secondary | ICD-10-CM | POA: Insufficient documentation

## 2012-09-10 HISTORY — DX: Fibromyalgia: M79.7

## 2012-09-10 LAB — CBC
HCT: 45.1 % (ref 36.0–46.0)
Hemoglobin: 15.9 g/dL — ABNORMAL HIGH (ref 12.0–15.0)
MCH: 33.1 pg (ref 26.0–34.0)
MCHC: 35.3 g/dL (ref 30.0–36.0)
MCV: 94 fL (ref 78.0–100.0)
Platelets: 478 10*3/uL — ABNORMAL HIGH (ref 150–400)
RBC: 4.8 MIL/uL (ref 3.87–5.11)
RDW: 14.2 % (ref 11.5–15.5)
WBC: 12.9 10*3/uL — ABNORMAL HIGH (ref 4.0–10.5)

## 2012-09-10 LAB — BASIC METABOLIC PANEL
BUN: 11 mg/dL (ref 6–23)
CO2: 28 mEq/L (ref 19–32)
Calcium: 9.8 mg/dL (ref 8.4–10.5)
Chloride: 97 mEq/L (ref 96–112)
Creatinine, Ser: 0.46 mg/dL — ABNORMAL LOW (ref 0.50–1.10)
GFR calc Af Amer: >90 mL/min (ref >90)
GFR calc non Af Amer: >90 mL/min (ref >90)
Glucose, Bld: 250 mg/dL — ABNORMAL HIGH (ref 70–99)
Potassium: 3.7 mEq/L (ref 3.5–5.1)
Sodium: 137 mEq/L (ref 135–145)

## 2012-09-10 MED ORDER — HYDROCODONE-ACETAMINOPHEN 5-325 MG PO TABS: 1.0000 | ORAL_TABLET | Freq: Four times a day (QID) | ORAL | Status: DC | PRN

## 2012-09-10 MED ORDER — IBUPROFEN 400 MG PO TABS
400.0000 mg | ORAL_TABLET | Freq: Once | ORAL | Status: AC
  Administered 2012-09-10: 400 mg via ORAL
  Filled 2012-09-10: qty 1

## 2012-09-10 NOTE — ED Notes (Signed)
Patient relates that past falls have been related to changing position from lying or sitting to standing.  States no dizziness or vertigo, but her legs "feel weak".  Today, patient "blacked out" after standing up from urinating.

## 2012-09-10 NOTE — ED Provider Notes (Signed)
History     CSN: [id number]  Arrival date & time 09/10/12  1307   First MD Initiated Contact with Patient 09/10/12 1401      Chief Complaint  Patient presents with  . Loss of Consciousness    (Consider location/radiation/quality/duration/timing/severity/associated sxs/prior treatment) Patient is a 47 y.o. female presenting with syncope. The history is provided by the patient.  Loss of Consciousness Associated symptoms: no confusion, no fever, no headaches, no palpitations, no seizures, no shortness of breath, no vomiting and no weakness   pt indicates last pm had gotten up to use bathroom, urinated, stood up and felt faint, ?brief syncopal event, fell against tub, hitting left lateral ribs. Was able to get self up and back to bed. Today notes constant, sharp pain left lateral chest wall where she struck her chest against tub. Constant, mod-sev pain, worse w movement, turning torso, and palpation area. Denies headache or head injury. No neck or back pain. No sob. No abd pain. Prior to fall/fainting last pm, no palpitations or irregular heart beat. No cp. No headache. Pt denies hx cad or dysrhythmia. No recent blood loss, vaginal bleeding or melena. No leg pain or swelling, no hx dvt or pe. Denies recent change in meds or new meds.     Past Medical History  Diagnosis Date  . Diabetes mellitus   . Hypertension   . High cholesterol   . Clostridium difficile colitis   . Sea-blue histiocyte syndrome   . Leukocytosis     Dr Mariel Sleet  . Fibromyalgia     Past Surgical History  Procedure Laterality Date  . Splenectomy    . Tonsillectomy    . Abdominal hysterectomy      partial  . Esophagogastroduodenoscopy  08/27/2011    Rourk-reactive gastropathy, normal, patent esophagus. Stomach empty. Multiple linear antral erosions. No ulcer or infiltrating process. Small hiatal hernia. Patent pylorus. Normal first and second portion of the duodenum  . Colonoscopy  12/07/2011    Rourk-normal  rectum,, colon and terminal ileum    Family History  Problem Relation Age of Onset  . Colon cancer Neg Hx   . Arthritis Mother   . Diabetes Father   . Kidney disease Father     History  Substance Use Topics  . Smoking status: Former Smoker -- 1.00 packs/day for 30 years    Types: Cigarettes    Quit date: 04/20/2006  . Smokeless tobacco: Never Used  . Alcohol Use: No    OB History   Grav Para Term Preterm Abortions TAB SAB Ect Mult Living                  Review of Systems  Constitutional: Negative for fever and chills.  HENT: Negative for neck pain.   Eyes: Negative for redness and visual disturbance.  Respiratory: Negative for cough and shortness of breath.   Cardiovascular: Positive for syncope. Negative for palpitations and leg swelling.  Gastrointestinal: Negative for vomiting, abdominal pain, diarrhea and blood in stool.  Genitourinary: Negative for dysuria, flank pain and vaginal bleeding.  Musculoskeletal: Negative for back pain.  Skin: Negative for wound.  Neurological: Negative for seizures, weakness, numbness and headaches.  Hematological: Does not bruise/bleed easily.  Psychiatric/Behavioral: Negative for confusion.    Allergies  Aspartame and phenylalanine; Sucralose; Dilaudid; and Oxycodone  Home Medications   Current Outpatient Rx  Name  Route  Sig  Dispense  Refill  . albuterol (PROVENTIL HFA;VENTOLIN HFA) 108 (90 BASE) MCG/ACT inhaler   Inhalation  Inhale 2 puffs into the lungs every 6 (six) hours as needed for wheezing.   1 Inhaler   0   . ALPRAZolam (XANAX) 1 MG tablet   Oral   Take 1 tablet (1 mg total) by mouth 2 (two) times daily as needed for sleep.   60 tablet   1   . cyclobenzaprine (FLEXERIL) 10 MG tablet   Oral   Take 1 tablet (10 mg total) by mouth every 8 (eight) hours as needed for muscle spasms.   60 tablet   3   . insulin lispro protamine-lispro (HUMALOG 75/25) (75-25) 100 UNIT/ML SUSP   Subcutaneous   Inject 50 Units  into the skin 2 (two) times daily with a meal.         . lisinopril (PRINIVIL,ZESTRIL) 5 MG tablet   Oral   Take 2.5 mg by mouth daily.         . metFORMIN (GLUCOPHAGE) 500 MG tablet   Oral   Take 1,000 mg by mouth 2 (two) times daily with a meal.          . pravastatin (PRAVACHOL) 20 MG tablet   Oral   Take 40 mg by mouth daily.          . pregabalin (LYRICA) 150 MG capsule   Oral   Take 1 capsule (150 mg total) by mouth 2 (two) times daily.   60 capsule   3     Dose change   . Probiotic Product (PROBIOTIC DAILY PO)   Oral   Take 1 tablet by mouth as needed.            BP 128/72  Pulse 101  Temp(Src) 99.2 F (37.3 C) (Oral)  Resp 16  Ht 5' 2.5" (1.588 m)  Wt 189 lb (85.73 kg)  BMI 34 kg/m2  SpO2 98%  Physical Exam  Nursing note and vitals reviewed. Constitutional: She is oriented to person, place, and time. She appears well-developed and well-nourished. No distress.  HENT:  Head: Atraumatic.  Mouth/Throat: Oropharynx is clear and moist.  Eyes: Conjunctivae are normal. Pupils are equal, round, and reactive to light. No scleral icterus.  Neck: Normal range of motion. Neck supple. No tracheal deviation present.  Cardiovascular: Normal rate, regular rhythm, normal heart sounds and intact distal pulses.  Exam reveals no gallop and no friction rub.   No murmur heard. Pulmonary/Chest: Effort normal and breath sounds normal. No respiratory distress. She exhibits tenderness.  Left lateral chest wall tenderness reproducing symptoms. No crepitus, normal chest movement.   Abdominal: Soft. Normal appearance and bowel sounds are normal. She exhibits no distension and no mass. There is no tenderness. There is no rebound and no guarding.  Genitourinary:  No cva tenderness  Musculoskeletal: Normal range of motion. She exhibits no edema and no tenderness.  CTLS spine, non tender, aligned, no step off.   Neurological: She is alert and oriented to person, place, and  time.  Motor intact bil. Steady gait.   Skin: Skin is warm and dry. No rash noted.  Psychiatric: She has a normal mood and affect.    ED Course  Procedures (including critical care time)  Results for orders placed during the hospital encounter of 09/10/12  CBC      Result Value Range   WBC 12.9 (*) 4.0 - 10.5 K/uL   RBC 4.80  3.87 - 5.11 MIL/uL   Hemoglobin 15.9 (*) 12.0 - 15.0 g/dL   HCT 16.1  09.6 - 04.5 %  MCV 94.0  78.0 - 100.0 fL   MCH 33.1  26.0 - 34.0 pg   MCHC 35.3  30.0 - 36.0 g/dL   RDW 16.1  09.6 - 04.5 %   Platelets 478 (*) 150 - 400 K/uL  BASIC METABOLIC PANEL      Result Value Range   Sodium 137  135 - 145 mEq/L   Potassium 3.7  3.5 - 5.1 mEq/L   Chloride 97  96 - 112 mEq/L   CO2 28  19 - 32 mEq/L   Glucose, Bld 250 (*) 70 - 99 mg/dL   BUN 11  6 - 23 mg/dL   Creatinine, Ser 4.09 (*) 0.50 - 1.10 mg/dL   Calcium 9.8  8.4 - 81.1 mg/dL   GFR calc non Af Amer >90  >90 mL/min   GFR calc Af Amer >90  >90 mL/min   Dg Ribs Unilateral W/chest Left  09/10/2012   *RADIOLOGY REPORT*  Clinical Data: Lower anterior right rib pain.  Status post fall against a bathtub.  LEFT RIBS AND CHEST - 3+ VIEW  Comparison: 08/22/2011  Findings: Heart size is normal.  The lungs are free of focal consolidations and pleural effusions.  No pulmonary edema.  No pneumothorax.  Oblique views show no acute, displaced rib fracture.  IMPRESSION: No evidence for acute cardiopulmonary abnormality.   Original Report Authenticated By: Norva Pavlov, M.D.   Mr Brain Wo Contrast  09/09/2012   *RADIOLOGY REPORT*  Clinical Data: Generalized weakness.  Fall  MRI HEAD WITHOUT CONTRAST  Technique:  Multiplanar, multiecho pulse sequences of the brain and surrounding structures were obtained according to standard protocol without intravenous contrast.  Comparison: CT head 05/02/2009  Findings: Ventricle size is normal.  Negative for Chiari malformation.  Pituitary is not enlarged.  Negative for acute or chronic  infarct.  Negative for demyelinating disease.  Cerebral white matter is normal.  Brainstem is normal.  Negative for hemorrhage or fluid collection.  Negative for mass or edema.  Prominent perivascular space in the right parietal white matter.  IMPRESSION: Normal MRI of the brain.   Original Report Authenticated By: Janeece Riggers, M.D.       MDM  Labs. cxr w ribs.  Reviewed nursing notes and prior charts for additional history.   Pt recent seen by pcp  - mri head yesterday, negative/normal.  Pt drove self to ED, therefore will hold on stronger pain med for now.   Date: 09/10/2012  Rate: 91  Rhythm: normal sinus rhythm  QRS Axis: normal  Intervals: normal  ST/T Wave abnormalities: normal  Conduction Disutrbances:none  Narrative Interpretation:   Old EKG Reviewed: none available  Motrin po.  Recheck pt comfortable, breathing comfortably, appears stable for d/c.         Suzi Roots, MD 09/10/12 1520

## 2012-09-10 NOTE — ED Notes (Signed)
Pt presents to er today with c/o syncopal episode that happened last night, causing pt to fall against bathtub, pt states taht she was sitting on the commode urinating and the next things she remembers is being wakened up by her mother, her left rib cage had hit the edge of the bathtub. Pt states that she has been having problems with "passing out spells" has fallen several times over this past month, has MRI of brain performed yesterday.

## 2012-09-20 ENCOUNTER — Ambulatory Visit: Payer: BC Managed Care – PPO | Admitting: Family Medicine

## 2012-09-20 ENCOUNTER — Ambulatory Visit: Payer: Self-pay | Admitting: Family Medicine

## 2012-11-25 ENCOUNTER — Telehealth: Payer: Self-pay | Admitting: Family Medicine

## 2012-11-25 MED ORDER — ALPRAZOLAM 1 MG PO TABS: 1.0000 mg | ORAL_TABLET | Freq: Two times a day (BID) | ORAL | Status: DC | PRN

## 2012-11-25 NOTE — Telephone Encounter (Signed)
Okay to refill? 

## 2012-11-25 NOTE — Telephone Encounter (Signed)
?   Ok to refill, last refill 05/14

## 2012-11-25 NOTE — Telephone Encounter (Signed)
Med refilled.

## 2012-12-02 ENCOUNTER — Ambulatory Visit (INDEPENDENT_AMBULATORY_CARE_PROVIDER_SITE_OTHER): Payer: BC Managed Care – PPO | Admitting: Family Medicine

## 2012-12-02 VITALS — BP 136/88 | HR 68 | Temp 97.1°F | Resp 18 | Wt 194.0 lb

## 2012-12-02 DIAGNOSIS — IMO0001 Reserved for inherently not codable concepts without codable children: Secondary | ICD-10-CM

## 2012-12-02 DIAGNOSIS — F4321 Adjustment disorder with depressed mood: Secondary | ICD-10-CM

## 2012-12-02 DIAGNOSIS — IMO0002 Reserved for concepts with insufficient information to code with codable children: Secondary | ICD-10-CM

## 2012-12-02 DIAGNOSIS — I10 Essential (primary) hypertension: Secondary | ICD-10-CM

## 2012-12-02 DIAGNOSIS — E1165 Type 2 diabetes mellitus with hyperglycemia: Secondary | ICD-10-CM

## 2012-12-02 DIAGNOSIS — E7800 Pure hypercholesterolemia, unspecified: Secondary | ICD-10-CM

## 2012-12-02 DIAGNOSIS — R42 Dizziness and giddiness: Secondary | ICD-10-CM

## 2012-12-02 DIAGNOSIS — M797 Fibromyalgia: Secondary | ICD-10-CM

## 2012-12-02 DIAGNOSIS — R5383 Other fatigue: Secondary | ICD-10-CM

## 2012-12-02 DIAGNOSIS — R531 Weakness: Secondary | ICD-10-CM

## 2012-12-02 DIAGNOSIS — R5381 Other malaise: Secondary | ICD-10-CM

## 2012-12-02 LAB — CBC WITH DIFFERENTIAL/PLATELET
Basophils Absolute: 0.1 10*3/uL (ref 0.0–0.1)
Basophils Relative: 1 % (ref 0–1)
Eosinophils Absolute: 0.3 10*3/uL (ref 0.0–0.7)
Eosinophils Relative: 2 % (ref 0–5)
HCT: 43 % (ref 36.0–46.0)
Hemoglobin: 14.8 g/dL (ref 12.0–15.0)
Lymphocytes Relative: 37 % (ref 12–46)
Lymphs Abs: 5.3 10*3/uL — ABNORMAL HIGH (ref 0.7–4.0)
MCH: 32.5 pg (ref 26.0–34.0)
MCHC: 34.4 g/dL (ref 30.0–36.0)
MCV: 94.5 fL (ref 78.0–100.0)
Monocytes Absolute: 1.5 10*3/uL — ABNORMAL HIGH (ref 0.1–1.0)
Monocytes Relative: 11 % (ref 3–12)
Neutro Abs: 7.1 10*3/uL (ref 1.7–7.7)
Neutrophils Relative %: 49 % (ref 43–77)
Platelets: 434 10*3/uL — ABNORMAL HIGH (ref 150–400)
RBC: 4.55 MIL/uL (ref 3.87–5.11)
RDW: 15.7 % — ABNORMAL HIGH (ref 11.5–15.5)
WBC: 14.2 10*3/uL — ABNORMAL HIGH (ref 4.0–10.5)

## 2012-12-02 LAB — COMPREHENSIVE METABOLIC PANEL
ALT: 46 U/L — ABNORMAL HIGH (ref 0–35)
AST: 38 U/L — ABNORMAL HIGH (ref 0–37)
Albumin: 4.2 g/dL (ref 3.5–5.2)
Alkaline Phosphatase: 94 U/L (ref 39–117)
BUN: 11 mg/dL (ref 6–23)
CO2: 27 mEq/L (ref 19–32)
Calcium: 9.9 mg/dL (ref 8.4–10.5)
Chloride: 102 mEq/L (ref 96–112)
Creat: 0.63 mg/dL (ref 0.50–1.10)
Glucose, Bld: 167 mg/dL — ABNORMAL HIGH (ref 70–99)
Potassium: 4.9 mEq/L (ref 3.5–5.3)
Sodium: 135 mEq/L (ref 135–145)
Total Bilirubin: 0.3 mg/dL (ref 0.3–1.2)
Total Protein: 7.1 g/dL (ref 6.0–8.3)

## 2012-12-02 LAB — LIPID PANEL
Cholesterol: 177 mg/dL (ref 0–200)
HDL: 44 mg/dL (ref >39)
LDL Cholesterol: 103 mg/dL — ABNORMAL HIGH (ref 0–99)
Total CHOL/HDL Ratio: 4 Ratio
Triglycerides: 152 mg/dL — ABNORMAL HIGH (ref <150)
VLDL: 30 mg/dL (ref 0–40)

## 2012-12-02 LAB — HEMOGLOBIN A1C
Hgb A1c MFr Bld: 8.7 % — ABNORMAL HIGH (ref <5.7)
Mean Plasma Glucose: 203 mg/dL — ABNORMAL HIGH (ref <117)

## 2012-12-02 LAB — MICROALBUMIN / CREATININE URINE RATIO
Creatinine, Urine: 82.2 mg/dL
Microalb Creat Ratio: 10.3 mg/g (ref 0.0–30.0)
Microalb, Ur: 0.85 mg/dL (ref 0.00–1.89)

## 2012-12-02 NOTE — Patient Instructions (Addendum)
Referral to neurologist Continue current medications Labs to be done we will fax to Dr. Fransico Him F/U 4 months

## 2012-12-02 NOTE — Progress Notes (Signed)
  Subjective:    Patient ID: [name], female    DOB: [birth year], 47 y.o.   MRN: [id number]  HPI  Pt here to f/u chronic medical problems. She is still following with endocrinology, due for fasting labs today. CBG have fluctuated 200-300's, last A1C 10.6% Fibromyalgia and generalized weakness- unchanged, has some days cant get out of bed, taking lyrica, seems nothing is helping, MRI of brain last visit was negative, labs have been unremarkable. Continues to have dizzy spells and headaches as well.  Seen in ER for presyncopal event with negative work-up.  Meds reviewed- on husbands insurance still, so able to get meds Review of Systems   GEN- + fatigue, fever, weight loss,weakness, recent illness HEENT- denies eye drainage, change in vision, nasal discharge, CVS- denies chest pain, palpitations RESP- denies SOB, cough, wheeze ABD- denies N/V, change in stools, abd pain GU- denies dysuria, hematuria, dribbling, incontinence MSK- + joint pain,+ muscle aches, injury Neuro- denies headache, dizziness, syncope, seizure activity      Objective:   Physical Exam GEN- NAD, alert and oriented x3 HEENT- PERRL, EOMI, non injected sclera, pink conjunctiva, MMM, oropharynx clear CVS- RRR, no murmur RESP-CTAB EXT- No edema Pulses- Radial, DP- 2+ MSK- TTP multiple trigger points, arms back, legs Neuro-CNII-XII intact,  Psych- depressed affect, not overly anxious, good eye contact, no SI          Assessment & Plan:

## 2012-12-04 ENCOUNTER — Encounter: Payer: Self-pay | Admitting: Family Medicine

## 2012-12-04 NOTE — Assessment & Plan Note (Signed)
Unchanged, I think this is a sequela of her fibromyalgia and depression, refer to neurology for any recommendations

## 2012-12-04 NOTE — Assessment & Plan Note (Signed)
Unchanged, per above

## 2012-12-04 NOTE — Assessment & Plan Note (Signed)
Obtain fasting labs and route to endocrinology today A1C has improved to 8.7%

## 2012-12-04 NOTE — Assessment & Plan Note (Signed)
Has seen rheumatology, neg MRI, refer to neurology for any recommendations

## 2012-12-04 NOTE — Assessment & Plan Note (Signed)
Unchanged, she has failed multiple meds trying to treat both fibromyaglia and mood such as cymbalta/elavil, gabapentin

## 2012-12-04 NOTE — Assessment & Plan Note (Signed)
BP on lower end, will have her hold the lisinopril and see if symptoms improve

## 2013-01-16 ENCOUNTER — Telehealth: Payer: Self-pay | Admitting: Family Medicine

## 2013-01-16 DIAGNOSIS — M779 Enthesopathy, unspecified: Secondary | ICD-10-CM

## 2013-01-16 NOTE — Telephone Encounter (Signed)
noted 

## 2013-01-16 NOTE — Telephone Encounter (Signed)
Dr Kellie Simmering said she has a bone spur in left shoulder.  Recommended she see Orthopedist. Please do referral.  Would like to see [facility].  Patient told to have records from Dr Kellie Simmering sent to [facility].  Ortho referral placed in [facility]

## 2013-02-13 ENCOUNTER — Encounter: Payer: Self-pay | Admitting: Family Medicine

## 2013-02-13 ENCOUNTER — Ambulatory Visit (INDEPENDENT_AMBULATORY_CARE_PROVIDER_SITE_OTHER): Payer: BC Managed Care – PPO | Admitting: Family Medicine

## 2013-02-13 VITALS — BP 128/80 | HR 88 | Temp 98.6°F | Resp 18 | Wt 200.0 lb

## 2013-02-13 DIAGNOSIS — Z01810 Encounter for preprocedural cardiovascular examination: Secondary | ICD-10-CM

## 2013-02-13 DIAGNOSIS — E1165 Type 2 diabetes mellitus with hyperglycemia: Secondary | ICD-10-CM

## 2013-02-13 DIAGNOSIS — IMO0002 Reserved for concepts with insufficient information to code with codable children: Secondary | ICD-10-CM

## 2013-02-13 DIAGNOSIS — D72829 Elevated white blood cell count, unspecified: Secondary | ICD-10-CM

## 2013-02-13 DIAGNOSIS — IMO0001 Reserved for inherently not codable concepts without codable children: Secondary | ICD-10-CM

## 2013-02-13 DIAGNOSIS — K7689 Other specified diseases of liver: Secondary | ICD-10-CM

## 2013-02-13 DIAGNOSIS — K760 Fatty (change of) liver, not elsewhere classified: Secondary | ICD-10-CM

## 2013-02-13 DIAGNOSIS — I10 Essential (primary) hypertension: Secondary | ICD-10-CM

## 2013-02-13 NOTE — Assessment & Plan Note (Signed)
Bp looks good on 2.5mg  lisinopril

## 2013-02-13 NOTE — Progress Notes (Signed)
  Subjective:    Patient ID: [name], female    DOB: 1965/11/14, 47 y.o.   MRN: [id number]  HPI Patient here for preop exam for left shoulder arthroscopy and possible mini open RCR under anesthesia. She's a history of a remote fall which resulted in tear of her rotator cuff which needs to be repaired. She's here today for preop clearance. She has history of uncontrolled diabetes as well as hypertension hyperlipidemia. She's no history of coronary artery disease, heart attack or stroke. She is able to do most of her daily activities without any shortness of breath or chest pain. Her blood sugars have been fluctuating recently. Her last A1c was 8.7% about 2 months ago. Her blood sugars the past couple weeks have been less than 200 throughout the day she did have one hypoglycemic episode to 67. Her blood sugar tends to go into the upper 200s towards the evening time and dinner time.  Medications reviewed   Review of Systems  GEN- denies fatigue, fever, weight loss,weakness, recent illness HEENT- denies eye drainage, change in vision, nasal discharge, CVS- denies chest pain, palpitations RESP- denies SOB, cough, wheeze ABD- denies N/V, change in stools, abd pain GU- denies dysuria, hematuria, dribbling, incontinence MSK- + joint pain, muscle aches, injury Neuro- denies headache, dizziness, syncope, seizure activity      Objective:   Physical Exam GEN- NAD, alert and oriented x3 , repeat BP 128/80 HEENT- PERRL, EOMI, non injected sclera, pink conjunctiva, MMM, oropharynx clear CVS- RRR, no murmur RESP-CTAB EXT- No edema Pulses- Radial, DP- 2+   EKG- NSR, no ST changes       Assessment & Plan:

## 2013-02-13 NOTE — Assessment & Plan Note (Signed)
Elevated LFT due to fatty liver disease on statin therapy Has had GI work up

## 2013-02-13 NOTE — Assessment & Plan Note (Addendum)
  Her A1c is improved however still uncontrolled. She's having elevated blood sugars towards the evening times I will increase her morning dose of Humulin 75 units and her evening dose will continue at 70 units  She will continue the metformin I would recommend CBG prior to beginning of operation

## 2013-02-13 NOTE — Assessment & Plan Note (Signed)
Patient here for preop exam. Her EKG is unremarkable. She has no coronary artery disease or previous MI. She's not high risk from a cardiovascular standpoint. She does have intermediate risk factors including her diabetes mellitus and hyperlipidemia, obesity. At this time she is cleared for surgical intervention she does not need any further workup Her last set of labs were reviewed

## 2013-02-13 NOTE — Patient Instructions (Signed)
For diabetes increase your morning dose to 75units Continue all other medications Flu shot given Cleared for surgery F/U as previous

## 2013-02-14 ENCOUNTER — Other Ambulatory Visit: Payer: Self-pay | Admitting: Orthopedic Surgery

## 2013-02-14 NOTE — Progress Notes (Signed)
Need orders in EPIC.  Surgery scheduled for 03/02/13.

## 2013-02-20 ENCOUNTER — Other Ambulatory Visit: Payer: Self-pay | Admitting: Orthopedic Surgery

## 2013-02-20 ENCOUNTER — Encounter (HOSPITAL_COMMUNITY): Payer: Self-pay | Admitting: Pharmacy Technician

## 2013-02-20 NOTE — H&P (Signed)
[name] is an 47 y.o. female.   Chief Complaint: left shoulder pain HPI: The patient is a 47 year old female being followed for their left shoulder pain. They are 1 year(s) out from when symptoms began. Symptoms reported today include: pain and numbness (left upper arm and fingers). The patient feels that they are doing poorly and report their pain level to be severe. Current treatment includes: bracing (sling), NSAIDs (Advil prn), pain medications (Tylenol and Flexeril prn) and Tylenol prn. The following medication has been used for pain control: Tylenol. The patient presents today following MRI. The patient has reported symptom improvement with: activity modification and bracing (sling) while they have not gotten any relief of their symptoms with: Cortisone injections or physical therapy. She recalls she actually had a fall in April or May of this year in her bathroom, and had already been having shoulder pain which was worsened by the fall. She does feel better in the sling with the arm supported. She previously had a subacromial steroid injection by Dr. Truslow with worsening of her pain but has not had any further injections.  Past Medical History  Diagnosis Date  . Diabetes mellitus   . Hypertension   . High cholesterol   . Clostridium difficile colitis   . Sea-blue histiocyte syndrome   . Leukocytosis     Dr Neijstrom  . Fibromyalgia     Past Surgical History  Procedure Laterality Date  . Splenectomy    . Tonsillectomy    . Abdominal hysterectomy      partial  . Esophagogastroduodenoscopy  08/27/2011    Rourk-reactive gastropathy, normal, patent esophagus. Stomach empty. Multiple linear antral erosions. No ulcer or infiltrating process. Small hiatal hernia. Patent pylorus. Normal first and second portion of the duodenum  . Colonoscopy  12/07/2011    Rourk-normal rectum,, colon and terminal ileum    Family History  Problem Relation Age of Onset  . Colon cancer Neg Hx   .  Arthritis Mother   . Diabetes Father   . Kidney disease Father    Social History:  reports that she quit smoking about 6 years ago. Her smoking use included Cigarettes. She has a 30 pack-year smoking history. She has never used smokeless tobacco. She reports that she does not drink alcohol or use illicit drugs.  Allergies:  Allergies  Allergen Reactions  . Aspartame And Phenylalanine Shortness Of Breath and Swelling    Throat swells  ANY ARTIFICAL SWEETNERS  . Sucralose Rash    Pt.s face broke out in rash after drinking Breeza.  Pt. Claims it is from the artificial sweetener.     . Dilaudid [Hydromorphone Hcl]     Itch  . Oxycodone      (Not in a hospital admission)  No results found for this or any previous visit (from the past 48 hour(s)). No results found.  Review of Systems  Constitutional: Negative.   HENT: Negative.   Eyes: Negative.   Respiratory: Negative.   Cardiovascular: Negative.   Gastrointestinal: Negative.   Genitourinary: Negative.   Musculoskeletal: Positive for joint pain.  Skin: Negative.   Neurological: Negative.   Endo/Heme/Allergies: Negative.   Psychiatric/Behavioral: Negative.     There were no vitals taken for this visit. Physical Exam  Constitutional: She is oriented to person, place, and time. She appears well-developed and well-nourished.  HENT:  Head: Normocephalic and atraumatic.  Eyes: Conjunctivae and EOM are normal. Pupils are equal, round, and reactive to light.  Neck: Normal   range of motion. Neck supple.  Cardiovascular: Normal rate and regular rhythm.   Respiratory: Effort normal and breath sounds normal.  GI: Soft. Bowel sounds are normal.  Musculoskeletal:  General Mental Status - Alert. General Appearance- pleasant and In acute distress. Orientation- Oriented X3. Build & Nutrition- Overweight.  Musculoskeletal Upper Extremity Left Upper Extremity: Left Shoulder: Inspection and Palpation:Tenderness- subacromial  space tender to palpation and anterior joint line tender to palpation. no tenderness to palpation of the AC joint, no tenderness to palpation of the [place] joint and no tenderness to palpation of the clavicle. Swelling- none. Tissue tension/texture is - soft. Muscle tone- no periarticular muscle atrophy noted. Sensation- intact to light touch. Skin:Color- no ecchymosis and no erythema. ROM: Internal Rotation:AROM- mildly decreased and painful. External Rotation:AROM- full. Flexion:AROM- mildly decreased and painful. Glenohumeral Abduction:AROM- mildly decreased and painful. Strength and Tone:Deltoid- normal. Rotator Cuff- normal. Instability- sulcus sign negative. Impingement- impingement sign positive and secondary impingement sign positive. Deformities/Malalignments/Discrepancies- no deformities noted. Special Testing- Speed's test negative.  Neurological: She is alert and oriented to person, place, and time. She has normal reflexes.  Skin: Skin is warm and dry.  Psychiatric: She has a normal mood and affect.    MRI images and report reviewed today by Dr. Beane with tendinosis infraspinatus and supraspinatus, severe, with shallow interstitial tear 6x5mm in the supraspinatus. Mild AC arthrosis. Mild bursitis.  Assessment/Plan Left shoulder impingement, RCT, adhesive capsulitis  Pt with L shoulder pain x 1 yr without injury, impingement, adhesive capsulitis, partial RCT on MRI, refractory to HEP, steroid injection, NSAIDs, sling, activity modification, relative rest. We discussed tx options. Given her worsening symptoms after her prior subacromial injection, we proceeded with a subacromial lidocaine injection today with which she reported immediate relief. Steroid injection was not repeated, she did have positive lidocaine effect, and is an insulin dependent diabetic therefore do not want to proceed with further steroids. Given her ongoing symptoms and exam with MRI findings it is  reasonable to proceed with surgery, which would be a left shoulder arthroscopy, SAD, possible mini-open RCR, EUA, possible MUA. We discussed the procedure itself as well as risks, complications, and alternatives including but not limited to DVT, PE, infx, bleeding, failure of procedure, need for secondary procedure, anesthesia risk, even death. Discussed expected outcome, post-op protocols, need for sling and PT. She desires to proceed, all questions answered. We will obtain pre-operative clearance from her PCP Dr. Kensett. She will call with any questions or concerns. Once clearance obtained, we will proceed accordingly and she will follow up 10-14 days post-op for suture removal and to start formal PT.  I had a long discussion with the patient concerning the risks and benefits of a rotator cuff repair, including bleeding, infection, prolonged postoperative recovery, which may require 3 to 5 months until maximum medical improvement. Overight procedure with initiation of early passive range of motion within physical therapy. Avoid any active motion for the first six weeks. This is all in an effort to avoid recurrent tear of the rotator cuff and adhesive capsulitis. Return to work without use of the arm can be obtained following two weeks. However, driving will be a challenge. We also discussed the possibility of requiring implants including bone anchors,as well as an Allograft patch graft if a massive rotator cuff tear is encountered. Removal of any bones for spurs as well as bursitis will be performed during the procedure and also any associated anesthetic complications as well.  Plan left shoulder EUA, possible MUA, arthroscopy,   SAD, possible mini-open RCR  Dandre Sisler M. for Dr. Beane 02/20/2013, 8:23 AM    

## 2013-02-22 ENCOUNTER — Other Ambulatory Visit (HOSPITAL_COMMUNITY): Payer: Self-pay | Admitting: Specialist

## 2013-02-22 NOTE — Progress Notes (Signed)
EKG 5/14, OV with clearance  Dr Jeanice Lim  10/14 EPIC

## 2013-02-22 NOTE — Patient Instructions (Addendum)
20 Shirline S Lal  02/22/2013   Your procedure is scheduled on:  03/02/13 THURSDAY  Report to [facility] at   0730    AM.  Call this number if you have problems the morning of surgery: [phone]     TAKE ONE HALF INSULIN DOSAGE Wednesday NIGHT.  HAVE SNACK BEFORE MIDNIGHT OR BEDTIME  Remember: DO NOT TAKE ANY BLOOD SUGAR MEDICATION MORNING OF SURGERY  Do not eat food  Or drink :After Midnight. Wednesday NIGHT   Take these medicines the morning of surgery with A SIP OF WATER: CYMBALTA, LYRICA, ALPRAZOLAM                                                      MAY TAKE FLEXARIL IF NEEDED   .  Contacts, dentures or partial plates can not be worn to surgery  Leave suitcase in the car. After surgery it may be brought to your room.  For patients admitted to the hospital, checkout time is 11:00 AM day of  discharge.             SPECIAL INSTRUCTIONS- SEE [place] PREPARING FOR SURGERY INSTRUCTION SHEET-     DO NOT WEAR JEWELRY, LOTIONS, POWDERS, OR PERFUMES.  WOMEN-- DO NOT SHAVE LEGS OR UNDERARMS FOR 12 HOURS BEFORE SHOWERS. MEN MAY SHAVE FACE.  Patients discharged the day of surgery will not be allowed to drive home. IF going home the day of surgery, you must have a driver and someone to stay with you for the first 24 hours  Name and phone number of your driver: mom                                                                       Please read over the following fact sheets that you were given :Incentive Spirometry Sheet                                                                                 Richanda Darin  PST [phone]  [phone]                 FAILURE TO FOLLOW THESE INSTRUCTIONS MAY RESULT IN  CANCELLATION   OF YOUR SURGERY                                                  Patient Signature _____________________________

## 2013-02-23 ENCOUNTER — Ambulatory Visit (HOSPITAL_COMMUNITY)
Admission: RE | Admit: 2013-02-23 | Discharge: 2013-02-23 | Disposition: A | Discharge status: Home / Self Care | Payer: BC Managed Care – PPO | Source: Ambulatory Visit | Attending: Specialist | Admitting: Specialist

## 2013-02-23 ENCOUNTER — Other Ambulatory Visit: Payer: Self-pay

## 2013-02-23 ENCOUNTER — Encounter (HOSPITAL_COMMUNITY): Payer: Self-pay

## 2013-02-23 ENCOUNTER — Encounter (HOSPITAL_COMMUNITY)
Admission: RE | Admit: 2013-02-23 | Discharge: 2013-02-23 | Disposition: A | Discharge status: Home / Self Care | Payer: BC Managed Care – PPO | Source: Ambulatory Visit | Attending: Specialist | Admitting: Specialist

## 2013-02-23 DIAGNOSIS — Z01812 Encounter for preprocedural laboratory examination: Secondary | ICD-10-CM | POA: Insufficient documentation

## 2013-02-23 DIAGNOSIS — Z01818 Encounter for other preprocedural examination: Secondary | ICD-10-CM | POA: Insufficient documentation

## 2013-02-23 DIAGNOSIS — I10 Essential (primary) hypertension: Secondary | ICD-10-CM | POA: Insufficient documentation

## 2013-02-23 HISTORY — DX: Polyneuropathy, unspecified: G62.9

## 2013-02-23 LAB — BASIC METABOLIC PANEL
BUN: 11 mg/dL (ref 6–23)
CO2: 26 mEq/L (ref 19–32)
Calcium: 9.4 mg/dL (ref 8.4–10.5)
Chloride: 101 mEq/L (ref 96–112)
Creatinine, Ser: 0.57 mg/dL (ref 0.50–1.10)
GFR calc Af Amer: >90 mL/min (ref >90)
GFR calc non Af Amer: >90 mL/min (ref >90)
Glucose, Bld: 134 mg/dL — ABNORMAL HIGH (ref 70–99)
Potassium: 4.2 mEq/L (ref 3.5–5.1)
Sodium: 137 mEq/L (ref 135–145)

## 2013-02-23 LAB — CBC
HCT: 43.7 % (ref 36.0–46.0)
Hemoglobin: 15.1 g/dL — ABNORMAL HIGH (ref 12.0–15.0)
MCH: 32.8 pg (ref 26.0–34.0)
MCHC: 34.6 g/dL (ref 30.0–36.0)
MCV: 95 fL (ref 78.0–100.0)
Platelets: 477 10*3/uL — ABNORMAL HIGH (ref 150–400)
RBC: 4.6 MIL/uL (ref 3.87–5.11)
RDW: 15.6 % — ABNORMAL HIGH (ref 11.5–15.5)
WBC: 13.6 10*3/uL — ABNORMAL HIGH (ref 4.0–10.5)

## 2013-02-23 NOTE — Progress Notes (Signed)
Faxed CBC to Dr Shelle Iron through [facility]

## 2013-02-28 ENCOUNTER — Telehealth: Payer: Self-pay | Admitting: *Deleted

## 2013-02-28 ENCOUNTER — Ambulatory Visit (HOSPITAL_COMMUNITY): Payer: BC Managed Care – PPO

## 2013-02-28 NOTE — Telephone Encounter (Signed)
Xanax 1 mg last refill 11/25/12, ?ok to refill

## 2013-02-28 NOTE — Telephone Encounter (Signed)
Okay to refill, give 3  

## 2013-03-01 MED ORDER — ALPRAZOLAM 1 MG PO TABS: 1.0000 mg | ORAL_TABLET | Freq: Two times a day (BID) | ORAL | Status: DC

## 2013-03-01 NOTE — Telephone Encounter (Signed)
Meds refilled.

## 2013-03-02 ENCOUNTER — Encounter (HOSPITAL_COMMUNITY): Payer: Self-pay | Admitting: *Deleted

## 2013-03-02 ENCOUNTER — Encounter (HOSPITAL_COMMUNITY)
Admission: RE | Disposition: A | Discharge status: Home / Self Care | Payer: Self-pay | Source: Ambulatory Visit | Attending: Specialist

## 2013-03-02 ENCOUNTER — Encounter (HOSPITAL_COMMUNITY): Payer: BC Managed Care – PPO | Admitting: Anesthesiology

## 2013-03-02 ENCOUNTER — Ambulatory Visit (HOSPITAL_COMMUNITY)
Admission: RE | Admit: 2013-03-02 | Discharge: 2013-03-03 | Disposition: A | Discharge status: Home / Self Care | Payer: BC Managed Care – PPO | Source: Ambulatory Visit | Attending: Specialist | Admitting: Specialist

## 2013-03-02 ENCOUNTER — Ambulatory Visit (HOSPITAL_COMMUNITY): Payer: BC Managed Care – PPO | Admitting: Anesthesiology

## 2013-03-02 DIAGNOSIS — M7500 Adhesive capsulitis of unspecified shoulder: Secondary | ICD-10-CM | POA: Insufficient documentation

## 2013-03-02 DIAGNOSIS — IMO0001 Reserved for inherently not codable concepts without codable children: Secondary | ICD-10-CM | POA: Insufficient documentation

## 2013-03-02 DIAGNOSIS — Z87891 Personal history of nicotine dependence: Secondary | ICD-10-CM | POA: Insufficient documentation

## 2013-03-02 DIAGNOSIS — I10 Essential (primary) hypertension: Secondary | ICD-10-CM | POA: Insufficient documentation

## 2013-03-02 DIAGNOSIS — E119 Type 2 diabetes mellitus without complications: Secondary | ICD-10-CM | POA: Insufficient documentation

## 2013-03-02 DIAGNOSIS — E7800 Pure hypercholesterolemia, unspecified: Secondary | ICD-10-CM | POA: Insufficient documentation

## 2013-03-02 DIAGNOSIS — M7542 Impingement syndrome of left shoulder: Secondary | ICD-10-CM

## 2013-03-02 DIAGNOSIS — M25819 Other specified joint disorders, unspecified shoulder: Secondary | ICD-10-CM | POA: Insufficient documentation

## 2013-03-02 DIAGNOSIS — M75100 Unspecified rotator cuff tear or rupture of unspecified shoulder, not specified as traumatic: Secondary | ICD-10-CM

## 2013-03-02 DIAGNOSIS — S43429A Sprain of unspecified rotator cuff capsule, initial encounter: Secondary | ICD-10-CM | POA: Insufficient documentation

## 2013-03-02 DIAGNOSIS — M75102 Unspecified rotator cuff tear or rupture of left shoulder, not specified as traumatic: Secondary | ICD-10-CM

## 2013-03-02 DIAGNOSIS — Z90710 Acquired absence of both cervix and uterus: Secondary | ICD-10-CM | POA: Insufficient documentation

## 2013-03-02 DIAGNOSIS — X58XXXA Exposure to other specified factors, initial encounter: Secondary | ICD-10-CM | POA: Insufficient documentation

## 2013-03-02 HISTORY — PX: SHOULDER ARTHROSCOPY WITH OPEN ROTATOR CUFF REPAIR: SHX6092

## 2013-03-02 LAB — GLUCOSE, CAPILLARY
Glucose-Capillary: 119 mg/dL — ABNORMAL HIGH (ref 70–99)
Glucose-Capillary: 117 mg/dL — ABNORMAL HIGH (ref 70–99)
Glucose-Capillary: 147 mg/dL — ABNORMAL HIGH (ref 70–99)
Glucose-Capillary: 136 mg/dL — ABNORMAL HIGH (ref 70–99)
Glucose-Capillary: 133 mg/dL — ABNORMAL HIGH (ref 70–99)

## 2013-03-02 LAB — CBC
HCT: 38.9 % (ref 36.0–46.0)
Hemoglobin: 13.6 g/dL (ref 12.0–15.0)
MCH: 33.3 pg (ref 26.0–34.0)
MCHC: 35 g/dL (ref 30.0–36.0)
MCV: 95.1 fL (ref 78.0–100.0)
Platelets: 458 10*3/uL — ABNORMAL HIGH (ref 150–400)
RBC: 4.09 MIL/uL (ref 3.87–5.11)
RDW: 15.5 % (ref 11.5–15.5)
WBC: 14 10*3/uL — ABNORMAL HIGH (ref 4.0–10.5)

## 2013-03-02 LAB — CREATININE, SERUM
Creatinine, Ser: 0.58 mg/dL (ref 0.50–1.10)
GFR calc Af Amer: >90 mL/min (ref >90)
GFR calc non Af Amer: >90 mL/min (ref >90)

## 2013-03-02 SURGERY — ARTHROSCOPY, SHOULDER WITH REPAIR, ROTATOR CUFF, OPEN
Anesthesia: General | Site: Shoulder | Laterality: Left | Wound class: Clean

## 2013-03-02 MED ORDER — LIDOCAINE HCL (CARDIAC) 20 MG/ML IV SOLN
INTRAVENOUS | Status: DC | PRN
  Administered 2013-03-02: 30 mg via INTRAVENOUS

## 2013-03-02 MED ORDER — ONDANSETRON HCL 4 MG PO TABS: 4.0000 mg | ORAL_TABLET | Freq: Four times a day (QID) | ORAL | Status: DC | PRN

## 2013-03-02 MED ORDER — ACETAMINOPHEN 650 MG RE SUPP: 650.0000 mg | Freq: Four times a day (QID) | RECTAL | Status: DC | PRN

## 2013-03-02 MED ORDER — EPINEPHRINE HCL 1 MG/ML IJ SOLN
INTRAMUSCULAR | Status: DC | PRN
  Administered 2013-03-02: 2 mL

## 2013-03-02 MED ORDER — PHENYLEPHRINE HCL 10 MG/ML IJ SOLN
INTRAMUSCULAR | Status: DC | PRN
  Administered 2013-03-02: 40 ug via INTRAVENOUS
  Administered 2013-03-02: 80 ug via INTRAVENOUS

## 2013-03-02 MED ORDER — DEXTROSE 5 % IV SOLN
500.0000 mg | Freq: Four times a day (QID) | INTRAVENOUS | Status: DC | PRN
  Administered 2013-03-02: 500 mg via INTRAVENOUS
  Filled 2013-03-02: qty 5

## 2013-03-02 MED ORDER — EPHEDRINE SULFATE 50 MG/ML IJ SOLN
INTRAMUSCULAR | Status: DC | PRN
  Administered 2013-03-02 (×2): 10 mg via INTRAVENOUS

## 2013-03-02 MED ORDER — PHENOL 1.4 % MT LIQD
1.0000 | OROMUCOSAL | Status: DC | PRN
  Filled 2013-03-02: qty 177

## 2013-03-02 MED ORDER — BUPIVACAINE-EPINEPHRINE PF 0.5-1:200000 % IJ SOLN
INTRAMUSCULAR | Status: AC
  Filled 2013-03-02: qty 30

## 2013-03-02 MED ORDER — ALPRAZOLAM 1 MG PO TABS
1.0000 mg | ORAL_TABLET | Freq: Two times a day (BID) | ORAL | Status: DC
  Administered 2013-03-02 – 2013-03-03 (×3): 1 mg via ORAL
  Filled 2013-03-02 (×3): qty 1

## 2013-03-02 MED ORDER — LISINOPRIL 2.5 MG PO TABS
2.5000 mg | ORAL_TABLET | Freq: Every day | ORAL | Status: DC
  Filled 2013-03-02 (×2): qty 1

## 2013-03-02 MED ORDER — EPINEPHRINE HCL 1 MG/ML IJ SOLN
INTRAMUSCULAR | Status: AC
  Filled 2013-03-02: qty 2

## 2013-03-02 MED ORDER — CYCLOBENZAPRINE HCL 10 MG PO TABS
10.0000 mg | ORAL_TABLET | Freq: Three times a day (TID) | ORAL | Status: DC | PRN
  Administered 2013-03-02 – 2013-03-03 (×2): 10 mg via ORAL
  Filled 2013-03-02 (×2): qty 1

## 2013-03-02 MED ORDER — BUPIVACAINE-EPINEPHRINE 0.5% -1:200000 IJ SOLN
INTRAMUSCULAR | Status: DC | PRN
  Administered 2013-03-02: 25 mL

## 2013-03-02 MED ORDER — SUCCINYLCHOLINE CHLORIDE 20 MG/ML IJ SOLN
INTRAMUSCULAR | Status: DC | PRN
  Administered 2013-03-02: 140 mg via INTRAVENOUS

## 2013-03-02 MED ORDER — LACTATED RINGERS IV SOLN
INTRAVENOUS | Status: DC | PRN
  Administered 2013-03-02: 10:00:00 via INTRAVENOUS

## 2013-03-02 MED ORDER — LACTATED RINGERS IV SOLN
INTRAVENOUS | Status: DC
  Administered 2013-03-02: 1000 mL via INTRAVENOUS

## 2013-03-02 MED ORDER — KETOROLAC TROMETHAMINE 10 MG PO TABS: 10.0000 mg | ORAL_TABLET | Freq: Four times a day (QID) | ORAL | Status: DC | PRN

## 2013-03-02 MED ORDER — NEOSTIGMINE METHYLSULFATE 1 MG/ML IJ SOLN
INTRAMUSCULAR | Status: DC | PRN
  Administered 2013-03-02: 3 mg via INTRAVENOUS

## 2013-03-02 MED ORDER — METOCLOPRAMIDE HCL 5 MG/ML IJ SOLN: 5.0000 mg | Freq: Three times a day (TID) | INTRAMUSCULAR | Status: DC | PRN

## 2013-03-02 MED ORDER — CEFAZOLIN SODIUM-DEXTROSE 2-3 GM-% IV SOLR
INTRAVENOUS | Status: AC
  Filled 2013-03-02: qty 50

## 2013-03-02 MED ORDER — SODIUM CHLORIDE 0.45 % IV SOLN
INTRAVENOUS | Status: AC
  Administered 2013-03-02: 14:00:00 via INTRAVENOUS

## 2013-03-02 MED ORDER — CISATRACURIUM BESYLATE (PF) 10 MG/5ML IV SOLN
INTRAVENOUS | Status: DC | PRN
  Administered 2013-03-02: 6 mg via INTRAVENOUS

## 2013-03-02 MED ORDER — FENTANYL CITRATE 0.05 MG/ML IJ SOLN
INTRAMUSCULAR | Status: DC | PRN
  Administered 2013-03-02: 50 ug via INTRAVENOUS

## 2013-03-02 MED ORDER — DIPHENHYDRAMINE HCL 12.5 MG/5ML PO ELIX
25.0000 mg | ORAL_SOLUTION | Freq: Four times a day (QID) | ORAL | Status: DC | PRN
  Administered 2013-03-02: 22:00:00 25 mg via ORAL
  Filled 2013-03-02: qty 10

## 2013-03-02 MED ORDER — HYDROCODONE-ACETAMINOPHEN 5-325 MG PO TABS
1.0000 | ORAL_TABLET | ORAL | Status: DC | PRN
  Administered 2013-03-02 – 2013-03-03 (×3): 2 via ORAL
  Administered 2013-03-03: 1 via ORAL
  Administered 2013-03-03 (×2): 2 via ORAL
  Filled 2013-03-02: qty 2
  Filled 2013-03-02: qty 1
  Filled 2013-03-02 (×4): qty 2

## 2013-03-02 MED ORDER — FENTANYL CITRATE 0.05 MG/ML IJ SOLN
INTRAMUSCULAR | Status: AC
  Filled 2013-03-02: qty 2

## 2013-03-02 MED ORDER — PROPOFOL 10 MG/ML IV BOLUS
INTRAVENOUS | Status: DC | PRN
  Administered 2013-03-02: 170 mg via INTRAVENOUS

## 2013-03-02 MED ORDER — PREGABALIN 100 MG PO CAPS
200.0000 mg | ORAL_CAPSULE | Freq: Two times a day (BID) | ORAL | Status: DC
  Administered 2013-03-02 – 2013-03-03 (×2): 200 mg via ORAL
  Filled 2013-03-02 (×2): qty 2

## 2013-03-02 MED ORDER — INSULIN ASPART PROT & ASPART (70-30 MIX) 100 UNIT/ML ~~LOC~~ SUSP
70.0000 [IU] | Freq: Two times a day (BID) | SUBCUTANEOUS | Status: DC
  Administered 2013-03-02 – 2013-03-03 (×2): 70 [IU] via SUBCUTANEOUS
  Filled 2013-03-02: qty 10

## 2013-03-02 MED ORDER — MENTHOL 3 MG MT LOZG
1.0000 | LOZENGE | OROMUCOSAL | Status: DC | PRN
  Filled 2013-03-02: qty 9

## 2013-03-02 MED ORDER — ACETAMINOPHEN 325 MG PO TABS: 650.0000 mg | ORAL_TABLET | Freq: Four times a day (QID) | ORAL | Status: DC | PRN

## 2013-03-02 MED ORDER — KETOROLAC TROMETHAMINE 30 MG/ML IJ SOLN
INTRAMUSCULAR | Status: DC | PRN
  Administered 2013-03-02: 30 mg via INTRAVENOUS

## 2013-03-02 MED ORDER — MIDAZOLAM HCL 5 MG/5ML IJ SOLN
INTRAMUSCULAR | Status: DC | PRN
  Administered 2013-03-02: 2 mg via INTRAVENOUS

## 2013-03-02 MED ORDER — INSULIN ASPART 100 UNIT/ML ~~LOC~~ SOLN
0.0000 [IU] | Freq: Three times a day (TID) | SUBCUTANEOUS | Status: DC
  Administered 2013-03-02: 18:00:00 2 [IU] via SUBCUTANEOUS

## 2013-03-02 MED ORDER — HYDROMORPHONE HCL PF 1 MG/ML IJ SOLN: 0.5000 mg | INTRAMUSCULAR | Status: DC | PRN

## 2013-03-02 MED ORDER — ONDANSETRON HCL 4 MG/2ML IJ SOLN: 4.0000 mg | Freq: Four times a day (QID) | INTRAMUSCULAR | Status: DC | PRN

## 2013-03-02 MED ORDER — FENTANYL CITRATE 0.05 MG/ML IJ SOLN
25.0000 ug | INTRAMUSCULAR | Status: DC | PRN
  Administered 2013-03-02: 25 ug via INTRAVENOUS
  Administered 2013-03-02: 50 ug via INTRAVENOUS
  Administered 2013-03-02: 25 ug via INTRAVENOUS
  Administered 2013-03-02: 50 ug via INTRAVENOUS
  Administered 2013-03-02: 25 ug via INTRAVENOUS

## 2013-03-02 MED ORDER — CHLORHEXIDINE GLUCONATE 4 % EX LIQD
60.0000 mL | Freq: Once | CUTANEOUS | Status: DC
Start: 2013-03-02 — End: 2013-03-02

## 2013-03-02 MED ORDER — HYDROCODONE-ACETAMINOPHEN 7.5-325 MG PO TABS: 1.0000 | ORAL_TABLET | ORAL | Status: DC | PRN

## 2013-03-02 MED ORDER — ONDANSETRON HCL 4 MG/2ML IJ SOLN
INTRAMUSCULAR | Status: DC | PRN
  Administered 2013-03-02: 2 mg via INTRAVENOUS
  Administered 2013-03-02: 4 mg via INTRAVENOUS

## 2013-03-02 MED ORDER — METFORMIN HCL 500 MG PO TABS
1000.0000 mg | ORAL_TABLET | Freq: Two times a day (BID) | ORAL | Status: DC
  Administered 2013-03-02 – 2013-03-03 (×2): 1000 mg via ORAL
  Filled 2013-03-02 (×4): qty 2

## 2013-03-02 MED ORDER — GLYCOPYRROLATE 0.2 MG/ML IJ SOLN
INTRAMUSCULAR | Status: DC | PRN
  Administered 2013-03-02: 0.4 mg via INTRAVENOUS

## 2013-03-02 MED ORDER — DULOXETINE HCL 60 MG PO CPEP
60.0000 mg | ORAL_CAPSULE | Freq: Every day | ORAL | Status: DC
  Administered 2013-03-03: 60 mg via ORAL
  Filled 2013-03-02: qty 1

## 2013-03-02 MED ORDER — LACTATED RINGERS IV SOLN: INTRAVENOUS | Status: DC

## 2013-03-02 MED ORDER — DOCUSATE SODIUM 100 MG PO CAPS
100.0000 mg | ORAL_CAPSULE | Freq: Two times a day (BID) | ORAL | Status: DC
  Administered 2013-03-02 – 2013-03-03 (×3): 100 mg via ORAL

## 2013-03-02 MED ORDER — CEFAZOLIN SODIUM-DEXTROSE 2-3 GM-% IV SOLR
2.0000 g | INTRAVENOUS | Status: AC
  Administered 2013-03-02: 2 g via INTRAVENOUS

## 2013-03-02 MED ORDER — CYCLOBENZAPRINE HCL 10 MG PO TABS: 10.0000 mg | ORAL_TABLET | Freq: Three times a day (TID) | ORAL | Status: DC | PRN

## 2013-03-02 MED ORDER — KETOROLAC TROMETHAMINE 30 MG/ML IJ SOLN
30.0000 mg | Freq: Four times a day (QID) | INTRAMUSCULAR | Status: DC
  Administered 2013-03-02 (×2): 30 mg via INTRAVENOUS
  Filled 2013-03-02 (×2): qty 1

## 2013-03-02 MED ORDER — METOCLOPRAMIDE HCL 10 MG PO TABS: 5.0000 mg | ORAL_TABLET | Freq: Three times a day (TID) | ORAL | Status: DC | PRN

## 2013-03-02 MED ORDER — LACTATED RINGERS IR SOLN
Status: DC | PRN
  Administered 2013-03-02: 6000 mL

## 2013-03-02 MED ORDER — ENOXAPARIN SODIUM 40 MG/0.4ML ~~LOC~~ SOLN
40.0000 mg | SUBCUTANEOUS | Status: DC
  Administered 2013-03-02 – 2013-03-03 (×2): 40 mg via SUBCUTANEOUS
  Filled 2013-03-02 (×2): qty 0.4

## 2013-03-02 SURGICAL SUPPLY — 53 items
ANCHOR NDL 9/16 CIR SZ 8 (NEEDLE) IMPLANT
ANCHOR NEEDLE 9/16 CIR SZ 8 (NEEDLE) IMPLANT
BLADE CUTTER GATOR 3.5 (BLADE) ×2 IMPLANT
BLADE SURG SZ11 CARB STEEL (BLADE) ×2 IMPLANT
BUR OVAL 4.0 (BURR) IMPLANT
CANNULA ACUFO 5X76 (CANNULA) ×2 IMPLANT
CLEANER TIP ELECTROSURG 2X2 (MISCELLANEOUS) IMPLANT
CLOTH 2% CHLOROHEXIDINE 3PK (PERSONAL CARE ITEMS) ×2 IMPLANT
DECANTER SPIKE VIAL GLASS SM (MISCELLANEOUS) ×2 IMPLANT
DRAPE ORTHO SPLIT 77X108 STRL (DRAPES) ×2
DRAPE POUCH INSTRU U-SHP 10X18 (DRAPES) IMPLANT
DRAPE SURG ORHT 6 SPLT 77X108 (DRAPES) IMPLANT
DRSG AQUACEL AG ADV 3.5X 4 (GAUZE/BANDAGES/DRESSINGS) ×1 IMPLANT
DRSG EMULSION OIL 3X3 NADH (GAUZE/BANDAGES/DRESSINGS) ×1 IMPLANT
DRSG MEPILEX BORDER 4X4 (GAUZE/BANDAGES/DRESSINGS) ×2 IMPLANT
DURAPREP 26ML APPLICATOR (WOUND CARE) ×2 IMPLANT
ELECT NDL TIP 2.8 STRL (NEEDLE) ×1 IMPLANT
ELECT NEEDLE TIP 2.8 STRL (NEEDLE) IMPLANT
GLOVE BIOGEL PI IND STRL 7.5 (GLOVE) ×1 IMPLANT
GLOVE BIOGEL PI INDICATOR 7.5 (GLOVE) ×1
GLOVE SURG SS PI 7.5 STRL IVOR (GLOVE) ×2 IMPLANT
GLOVE SURG SS PI 8.0 STRL IVOR (GLOVE) ×3 IMPLANT
GOWN STRL REIN XL XLG (GOWN DISPOSABLE) ×4 IMPLANT
KIT BASIN OR (CUSTOM PROCEDURE TRAY) ×2 IMPLANT
KIT POSITION SHOULDER SCHLEI (MISCELLANEOUS) ×2 IMPLANT
MANIFOLD NEPTUNE II (INSTRUMENTS) ×2 IMPLANT
NDL MA TROC 1/2 (NEEDLE) IMPLANT
NDL MA TROC 1/2 CIR (NEEDLE) IMPLANT
NDL SCORPION MULTI FIRE (NEEDLE) ×1 IMPLANT
NDL SPNL 18GX3.5 QUINCKE PK (NEEDLE) ×1 IMPLANT
NEEDLE MA TROC 1/2 (NEEDLE) IMPLANT
NEEDLE MA TROC 1/2 CIR (NEEDLE) IMPLANT
NEEDLE SCORPION MULTI FIRE (NEEDLE) IMPLANT
NEEDLE SPNL 18GX3.5 QUINCKE PK (NEEDLE) ×2 IMPLANT
PACK SHOULDER CUSTOM OPM052 (CUSTOM PROCEDURE TRAY) ×2 IMPLANT
POSITIONER SURGICAL ARM (MISCELLANEOUS) ×2 IMPLANT
SET ARTHROSCOPY TUBING (MISCELLANEOUS) ×2
SET ARTHROSCOPY TUBING LN (MISCELLANEOUS) ×1 IMPLANT
SLING ARM IMMOBILIZER LRG (SOFTGOODS) IMPLANT
SLING ARM IMMOBILIZER MED (SOFTGOODS) ×1 IMPLANT
SPONGE LAP 4X18 X RAY DECT (DISPOSABLE) IMPLANT
STRIP CLOSURE SKIN 1/2X4 (GAUZE/BANDAGES/DRESSINGS) ×1 IMPLANT
SUT BONE WAX W31G (SUTURE) IMPLANT
SUT ETHILON 4 0 PS 2 18 (SUTURE) ×2 IMPLANT
SUT PROLENE 3 0 PS 2 (SUTURE) IMPLANT
SUT TIGER TAPE 7 IN WHITE (SUTURE) IMPLANT
SUT VIC AB 1-0 CT2 27 (SUTURE) IMPLANT
SUT VIC AB 2-0 CT2 27 (SUTURE) IMPLANT
SUT VICRYL 0 UR6 27IN ABS (SUTURE) IMPLANT
SUT VICRYL 0-0 OS 2 NEEDLE (SUTURE) IMPLANT
TAPE FIBER 2MM 7IN #2 BLUE (SUTURE) IMPLANT
TUBING CONNECTING 10 (TUBING) ×2 IMPLANT
WAND 90 DEG TURBOVAC W/CORD (SURGICAL WAND) ×1 IMPLANT

## 2013-03-02 NOTE — Interval H&P Note (Signed)
History and Physical Interval Note:  03/02/2013 9:03 AM  [name]  has presented today for surgery, with the diagnosis of LEFT SHOULDER IMPINGEMENT ROTATOR CUFF TEAR ADHESIVE CAPSULITIS  The various methods of treatment have been discussed with the patient and family. After consideration of risks, benefits and other options for treatment, the patient has consented to  Procedure(s): LEFT SHOULDER ARTHROSCOPY SUBACROMIAL DECOMPRESSION POSS MINI  OPEN ROTATOR CUFF REPAIR POSS MANIPULATION UNDER ANES AND POSSIBLE EXAM UNDER ANES.  (Left) as a surgical intervention .  The patient's history has been reviewed, patient examined, no change in status, stable for surgery.  I have reviewed the patient's chart and labs.  Questions were answered to the patient's satisfaction.     [name]

## 2013-03-02 NOTE — Brief Op Note (Signed)
03/02/2013  11:01 AM  PATIENT:  [name]  47 y.o. female  PRE-OPERATIVE DIAGNOSIS:  LEFT SHOULDER IMPINGEMENT ROTATOR CUFF TEAR ADHESIVE CAPSULITIS  POST-OPERATIVE DIAGNOSIS:  LEFT SHOULDER IMPINGEMENT ROTATOR CUFF TEAR ADHESIVE CAPSULITIS  PROCEDURE:  Procedure(s): LEFT SHOULDER ARTHROSCOPY, SUBACROMIAL DECOMPRESSION, MANIPULATION UNDER ANESTHESIA (Left)  SURGEON:  Surgeon(s) and Role:    * Javier Docker, MD - Primary  PHYSICIAN ASSISTANT:   ASSISTANTS: Bissell   ANESTHESIA:   general  EBL:     BLOOD ADMINISTERED:none  DRAINS: none   LOCAL MEDICATIONS USED:  MARCAINE     SPECIMEN:  No Specimen  DISPOSITION OF SPECIMEN:  N/A  COUNTS:  YES  TOURNIQUET:  * No tourniquets in log *  DICTATION: .Other Dictation: Dictation Number [id number]  PLAN OF CARE: Admit for overnight observation  PATIENT DISPOSITION:  PACU - hemodynamically stable.   Delay start of Pharmacological VTE agent (>24hrs) due to surgical blood loss or risk of bleeding: no

## 2013-03-02 NOTE — Transfer of Care (Signed)
Immediate Anesthesia Transfer of Care Note  Patient: [name]  Procedure(s) Performed: Procedure(s): LEFT SHOULDER ARTHROSCOPY, SUBACROMIAL DECOMPRESSION, MANIPULATION UNDER ANESTHESIA (Left)  Patient Location: PACU  Anesthesia Type:General  Level of Consciousness: awake, alert , oriented and patient cooperative  Airway & Oxygen Therapy: Patient Spontanous Breathing and Patient connected to face mask oxygen  Post-op Assessment: Report given to PACU RN and Post -op Vital signs reviewed and stable  Post vital signs: stable  Complications: No apparent anesthesia complications

## 2013-03-02 NOTE — Anesthesia Postprocedure Evaluation (Signed)
  Anesthesia Post-op Note  Patient: [name]  Procedure(s) Performed: Procedure(s) (LRB): LEFT SHOULDER ARTHROSCOPY, SUBACROMIAL DECOMPRESSION, MANIPULATION UNDER ANESTHESIA (Left)  Patient Location: PACU  Anesthesia Type: General  Level of Consciousness: awake and alert   Airway and Oxygen Therapy: Patient Spontanous Breathing  Post-op Pain: mild  Post-op Assessment: Post-op Vital signs reviewed, Patient's Cardiovascular Status Stable, Respiratory Function Stable, Patent Airway and No signs of Nausea or vomiting  Last Vitals:  Filed Vitals:   03/02/13 1526  BP: 99/59  Pulse: 76  Temp: 37.2 C  Resp: 16    Post-op Vital Signs: stable   Complications: No apparent anesthesia complications

## 2013-03-02 NOTE — Anesthesia Preprocedure Evaluation (Addendum)
Anesthesia Evaluation  Patient identified by MRN, date of birth, ID band Patient awake    Reviewed: Allergy & Precautions, H&P , NPO status , Patient's Chart, lab work & pertinent test results  Airway Mallampati: II TM Distance: >3 FB Neck ROM: full    Dental no notable dental hx. (+) Teeth Intact and Dental Advisory Given   Pulmonary neg pulmonary ROS, former smoker,  breath sounds clear to auscultation  Pulmonary exam normal       Cardiovascular Exercise Tolerance: Good hypertension, Pt. on medications Rhythm:regular Rate:Normal     Neuro/Psych negative neurological ROS  negative psych ROS   GI/Hepatic negative GI ROS, Neg liver ROS, GERD-  Medicated and Controlled,  Endo/Other  diabetes, Well Controlled, Type 2, Oral Hypoglycemic Agents  Renal/GU negative Renal ROS  negative genitourinary   Musculoskeletal   Abdominal   Peds  Hematology negative hematology ROS (+)   Anesthesia Other Findings   Reproductive/Obstetrics negative OB ROS                         Anesthesia Physical Anesthesia Plan  ASA: III  Anesthesia Plan: General   Post-op Pain Management:    Induction: Intravenous  Airway Management Planned: Oral ETT  Additional Equipment:   Intra-op Plan:   Post-operative Plan: Extubation in OR  Informed Consent: I have reviewed the patients History and Physical, chart, labs and discussed the procedure including the risks, benefits and alternatives for the proposed anesthesia with the patient or authorized representative who has indicated his/her understanding and acceptance.   Dental Advisory Given  Plan Discussed with: CRNA and Surgeon  Anesthesia Plan Comments:        Anesthesia Quick Evaluation

## 2013-03-02 NOTE — H&P (View-Only) (Signed)
[name] is an 47 y.o. female.   Chief Complaint: left shoulder pain HPI: The patient is a 47 year old female being followed for their left shoulder pain. They are 1 year(s) out from when symptoms began. Symptoms reported today include: pain and numbness (left upper arm and fingers). The patient feels that they are doing poorly and report their pain level to be severe. Current treatment includes: bracing (sling), NSAIDs (Advil prn), pain medications (Tylenol and Flexeril prn) and Tylenol prn. The following medication has been used for pain control: Tylenol. The patient presents today following MRI. The patient has reported symptom improvement with: activity modification and bracing (sling) while they have not gotten any relief of their symptoms with: Cortisone injections or physical therapy. She recalls she actually had a fall in April or May of this year in her bathroom, and had already been having shoulder pain which was worsened by the fall. She does feel better in the sling with the arm supported. She previously had a subacromial steroid injection by Dr. Kellie Simmering with worsening of her pain but has not had any further injections.  Past Medical History  Diagnosis Date  . Diabetes mellitus   . Hypertension   . High cholesterol   . Clostridium difficile colitis   . Sea-blue histiocyte syndrome   . Leukocytosis     Dr Mariel Sleet  . Fibromyalgia     Past Surgical History  Procedure Laterality Date  . Splenectomy    . Tonsillectomy    . Abdominal hysterectomy      partial  . Esophagogastroduodenoscopy  08/27/2011    Rourk-reactive gastropathy, normal, patent esophagus. Stomach empty. Multiple linear antral erosions. No ulcer or infiltrating process. Small hiatal hernia. Patent pylorus. Normal first and second portion of the duodenum  . Colonoscopy  12/07/2011    Rourk-normal rectum,, colon and terminal ileum    Family History  Problem Relation Age of Onset  . Colon cancer Neg Hx   .  Arthritis Mother   . Diabetes Father   . Kidney disease Father    Social History:  reports that she quit smoking about 6 years ago. Her smoking use included Cigarettes. She has a 30 pack-year smoking history. She has never used smokeless tobacco. She reports that she does not drink alcohol or use illicit drugs.  Allergies:  Allergies  Allergen Reactions  . Aspartame And Phenylalanine Shortness Of Breath and Swelling    Throat swells  ANY ARTIFICAL SWEETNERS  . Sucralose Rash    Pt.s face broke out in rash after drinking Breeza.  Pt. Claims it is from the artificial sweetener.     . Dilaudid [Hydromorphone Hcl]     Itch  . Oxycodone      (Not in a hospital admission)  No results found for this or any previous visit (from the past 48 hour(s)). No results found.  Review of Systems  Constitutional: Negative.   HENT: Negative.   Eyes: Negative.   Respiratory: Negative.   Cardiovascular: Negative.   Gastrointestinal: Negative.   Genitourinary: Negative.   Musculoskeletal: Positive for joint pain.  Skin: Negative.   Neurological: Negative.   Endo/Heme/Allergies: Negative.   Psychiatric/Behavioral: Negative.     There were no vitals taken for this visit. Physical Exam  Constitutional: She is oriented to person, place, and time. She appears well-developed and well-nourished.  HENT:  Head: Normocephalic and atraumatic.  Eyes: Conjunctivae and EOM are normal. Pupils are equal, round, and reactive to light.  Neck: Normal  range of motion. Neck supple.  Cardiovascular: Normal rate and regular rhythm.   Respiratory: Effort normal and breath sounds normal.  GI: Soft. Bowel sounds are normal.  Musculoskeletal:  General Mental Status - Alert. General Appearance- pleasant and In acute distress. Orientation- Oriented X3. Build & Nutrition- Overweight.  Musculoskeletal Upper Extremity Left Upper Extremity: Left Shoulder: Inspection and Palpation:Tenderness- subacromial  space tender to palpation and anterior joint line tender to palpation. no tenderness to palpation of the [facility] joint, no tenderness to palpation of the [place] joint and no tenderness to palpation of the clavicle. Swelling- none. Tissue tension/texture is - soft. Muscle tone- no periarticular muscle atrophy noted. Sensation- intact to light touch. Skin:Color- no ecchymosis and no erythema. ROM: Internal Rotation:AROM- mildly decreased and painful. External Rotation:AROM- full. Flexion:AROM- mildly decreased and painful. Glenohumeral Abduction:AROM- mildly decreased and painful. Strength and Tone:Deltoid- normal. Rotator Cuff- normal. Instability- sulcus sign negative. Impingement- impingement sign positive and secondary impingement sign positive. Deformities/Malalignments/Discrepancies- no deformities noted. Special Testing- Speed's test negative.  Neurological: She is alert and oriented to person, place, and time. She has normal reflexes.  Skin: Skin is warm and dry.  Psychiatric: She has a normal mood and affect.    MRI images and report reviewed today by Dr. Shelle Iron with tendinosis infraspinatus and supraspinatus, severe, with shallow interstitial tear 6x16mm in the supraspinatus. Mild AC arthrosis. Mild bursitis.  Assessment/Plan Left shoulder impingement, RCT, adhesive capsulitis  Pt with L shoulder pain x 1 yr without injury, impingement, adhesive capsulitis, partial RCT on MRI, refractory to HEP, steroid injection, NSAIDs, sling, activity modification, relative rest. We discussed tx options. Given her worsening symptoms after her prior subacromial injection, we proceeded with a subacromial lidocaine injection today with which she reported immediate relief. Steroid injection was not repeated, she did have positive lidocaine effect, and is an insulin dependent diabetic therefore do not want to proceed with further steroids. Given her ongoing symptoms and exam with MRI findings it is  reasonable to proceed with surgery, which would be a left shoulder arthroscopy, SAD, possible mini-open RCR, EUA, possible MUA. We discussed the procedure itself as well as risks, complications, and alternatives including but not limited to DVT, PE, infx, bleeding, failure of procedure, need for secondary procedure, anesthesia risk, even death. Discussed expected outcome, post-op protocols, need for sling and PT. She desires to proceed, all questions answered. We will obtain pre-operative clearance from her PCP Dr. Jeanice Lim. She will call with any questions or concerns. Once clearance obtained, we will proceed accordingly and she will follow up 10-14 days post-op for suture removal and to start formal PT.  I had a long discussion with the patient concerning the risks and benefits of a rotator cuff repair, including bleeding, infection, prolonged postoperative recovery, which may require 3 to 5 months until maximum medical improvement. Overight procedure with initiation of early passive range of motion within physical therapy. Avoid any active motion for the first six weeks. This is all in an effort to avoid recurrent tear of the rotator cuff and adhesive capsulitis. Return to work without use of the arm can be obtained following two weeks. However, driving will be a challenge. We also discussed the possibility of requiring implants including bone anchors,as well as an Allograft patch graft if a massive rotator cuff tear is encountered. Removal of any bones for spurs as well as bursitis will be performed during the procedure and also any associated anesthetic complications as well.  Plan left shoulder EUA, possible MUA, arthroscopy,  SAD, possible mini-open RCR  Joeangel Jeanpaul M. for Dr. Shelle Iron 02/20/2013, 8:23 AM

## 2013-03-03 ENCOUNTER — Encounter (HOSPITAL_COMMUNITY): Payer: Self-pay | Admitting: Specialist

## 2013-03-03 ENCOUNTER — Encounter (HOSPITAL_COMMUNITY): Payer: Self-pay

## 2013-03-03 LAB — GLUCOSE, CAPILLARY
Glucose-Capillary: 120 mg/dL — ABNORMAL HIGH (ref 70–99)
Glucose-Capillary: 120 mg/dL — ABNORMAL HIGH (ref 70–99)
Glucose-Capillary: 113 mg/dL — ABNORMAL HIGH (ref 70–99)

## 2013-03-03 NOTE — Care Management Note (Signed)
    Page 1 of 1   03/03/2013     2:15:42 PM   CARE MANAGEMENT NOTE 03/03/2013  Patient:  BRITANNY, MARKSBERRY   Account Number:  [id number]  Date Initiated:  03/03/2013  Documentation initiated by:  Colleen Can  Subjective/Objective Assessment:   dx left shoulder impingement, rotator cuff tear, adhesive capsulitis; left shoulder arthroscopy arthroscopy , subacromial decompression, manipulation under anesthesia     Action/Plan:   CM spoke with patient and her mother. Plans are for patient to return to her home in [place] [facility] where mother will be caregiver. She has sling and ice bag in place. No HH or DME needs.   Anticipated DC Date:  03/03/2013   Anticipated DC Plan:  HOME/SELF CARE      DC Planning Services  CM consult      Choice offered to / List presented to:             Status of service:  Completed, signed off Medicare Important Message given?   (If response is "NO", the following Medicare IM given date fields will be blank) Date Medicare IM given:   Date Additional Medicare IM given:    Discharge Disposition:  HOME/SELF CARE  Per UR Regulation:    If discussed at Long Length of Stay Meetings, dates discussed:    Comments:

## 2013-03-03 NOTE — Discharge Summary (Signed)
Physician Discharge Summary   Patient ID: [name] MRN: [id number] DOB/AGE: June 29, 1965 47 y.o.  Admit date: 03/02/2013 Discharge date: 03/03/2013  Primary Diagnosis:   LEFT SHOULDER IMPINGEMENT ROTATOR CUFF TEAR ADHESIVE CAPSULITIS  Admission Diagnoses:  Past Medical History  Diagnosis Date  . Diabetes mellitus   . Hypertension   . High cholesterol   . Clostridium difficile colitis   . Sea-blue histiocyte syndrome   . Leukocytosis     Dr Mariel Sleet  . Fibromyalgia   . Neuropathy, peripheral    Discharge Diagnoses:   Principal Problem:   Rotator cuff tear, non-traumatic  Procedure:  Procedure(s) (LRB): LEFT SHOULDER ARTHROSCOPY, SUBACROMIAL DECOMPRESSION, MANIPULATION UNDER ANESTHESIA (Left)   Consults: None  HPI:  see H&P    Laboratory Data: Hospital Outpatient Visit on 02/23/2013  Component Date Value Range Status  . Sodium 02/23/2013 137  135 - 145 mEq/L Final  . Potassium 02/23/2013 4.2  3.5 - 5.1 mEq/L Final  . Chloride 02/23/2013 101  96 - 112 mEq/L Final  . CO2 02/23/2013 26  19 - 32 mEq/L Final  . Glucose, Bld 02/23/2013 134* 70 - 99 mg/dL Final  . BUN 86/57/8469 11  6 - 23 mg/dL Final  . Creatinine, Ser 02/23/2013 0.57  0.50 - 1.10 mg/dL Final  . Calcium 62/95/2841 9.4  8.4 - 10.5 mg/dL Final  . GFR calc non Af Amer 02/23/2013 >90  >90 mL/min Final  . GFR calc Af Amer 02/23/2013 >90  >90 mL/min Final   Comment: (NOTE)                          The eGFR has been calculated using the CKD EPI equation.                          This calculation has not been validated in all clinical situations.                          eGFR's persistently <90 mL/min signify possible Chronic Kidney                          Disease.  . WBC 02/23/2013 13.6* 4.0 - 10.5 K/uL Final  . RBC 02/23/2013 4.60  3.87 - 5.11 MIL/uL Final  . Hemoglobin 02/23/2013 15.1* 12.0 - 15.0 g/dL Final  . HCT 32/44/0102 43.7  36.0 - 46.0 % Final  . MCV 02/23/2013 95.0  78.0 - 100.0 fL Final   . MCH 02/23/2013 32.8  26.0 - 34.0 pg Final  . MCHC 02/23/2013 34.6  30.0 - 36.0 g/dL Final  . RDW 72/53/6644 15.6* 11.5 - 15.5 % Final  . Platelets 02/23/2013 477* 150 - 400 K/uL Final    Recent Labs  03/02/13 1329  HGB 13.6    Recent Labs  03/02/13 1329  WBC 14.0*  RBC 4.09  HCT 38.9  PLT 458*    Recent Labs  03/02/13 1329  CREATININE 0.58   No results found for this basename: LABPT, INR,  in the last 72 hours  X-Rays:Dg Chest 2 View  02/23/2013   CLINICAL DATA:  Preoperative shoulder surgery; hypertension  EXAM: CHEST  2 VIEW  COMPARISON:  Sep 10, 2012  FINDINGS: Lungs are clear. Heart size and pulmonary vascularity are normal. No adenopathy. No bone lesions.  IMPRESSION: No edema or consolidation.   Electronically Signed  By: Bretta Bang M.D.   On: 02/23/2013 12:40    EKG: Orders placed during the hospital encounter of 09/10/12  . EKG 12-LEAD  . EKG 12-LEAD  . ED EKG  . ED EKG  . EKG 12-LEAD  . EKG 12-LEAD  . EKG     Hospital Course: Patient was admitted to [facility] and taken to the OR and underwent the above state procedure without complications.  Patient tolerated the procedure well and was later transferred to the recovery room and then to the orthopaedic floor for postoperative care.  They were given PO and IV analgesics for pain control following their surgery.  They were given 24 hours of postoperative antibiotics.   PT was consulted postop to assist with motion and HEP.  Discharge planning was consulted to help with postop disposition and equipment needs.  Patient had a fair night on the evening of surgery and started to get up OOB with therapy on day one. Patient was seen in rounds and was ready to go home on day one.  They were given discharge instructions and dressing directions.  They were instructed on when to follow up in the office with Dr. Shelle Iron.  Discharge Medications: Prior to Admission medications   Medication Sig Start Date  End Date Taking? Authorizing Provider  acetaminophen (TYLENOL) 500 MG tablet Take 500 mg by mouth every 6 (six) hours as needed for pain.   Yes Historical Provider, MD  DULoxetine (CYMBALTA) 60 MG capsule Take 60 mg by mouth daily.   Yes Historical Provider, MD  insulin NPH-regular (HUMULIN 70/30) (70-30) 100 UNIT/ML injection Inject 70-75 Units into the skin daily. Inject 70 units q AM and 75 units q pm   Yes Historical Provider, MD  lisinopril (PRINIVIL,ZESTRIL) 5 MG tablet Take 2.5 mg by mouth daily. 08/29/12 08/29/13 Yes Salley Scarlet, MD  metFORMIN (GLUCOPHAGE) 500 MG tablet Take 1,000 mg by mouth 2 (two) times daily with a meal.    Yes Historical Provider, MD  pravastatin (PRAVACHOL) 20 MG tablet Take 40 mg by mouth daily.    Yes Historical Provider, MD  pravastatin (PRAVACHOL) 40 MG tablet Take 40 mg by mouth at bedtime.    Yes Historical Provider, MD  pregabalin (LYRICA) 100 MG capsule Take 200 mg by mouth 2 (two) times daily.   Yes Historical Provider, MD  ALPRAZolam Prudy Feeler) 1 MG tablet Take 1 tablet (1 mg total) by mouth 2 (two) times daily. 03/01/13 03/01/14  Salley Scarlet, MD  cyclobenzaprine (FLEXERIL) 10 MG tablet Take 1 tablet (10 mg total) by mouth 3 (three) times daily as needed for muscle spasms. 03/02/13   Javier Docker, MD  HYDROcodone-acetaminophen (NORCO) 7.5-325 MG per tablet Take 1-2 tablets by mouth every 4 (four) hours as needed for moderate pain. 03/02/13   Javier Docker, MD  ketorolac (TORADOL) 10 MG tablet Take 1 tablet (10 mg total) by mouth every 6 (six) hours as needed. 03/02/13   Javier Docker, MD    Diet: Diabetic diet Activity:WBAT Follow-up:in 10-14 days Disposition - Home Discharged Condition: good   Discharge Orders   Future Appointments Provider Department Dept Phone   04/03/2013 10:00 AM Salley Scarlet, MD [facility] [phone]   Future Orders Complete By Expires   Call MD / Call 911  As directed    Comments:     If  you experience chest pain or shortness of breath, CALL 911 and be transported to the hospital emergency  room.  If you develope a fever above 101 F, pus (white drainage) or increased drainage or redness at the wound, or calf pain, call your surgeon's office.   Constipation Prevention  As directed    Comments:     Drink plenty of fluids.  Prune juice may be helpful.  You may use a stool softener, such as Colace (over the counter) 100 mg twice a day.  Use MiraLax (over the counter) for constipation as needed.   Diet - low sodium heart healthy  As directed    Increase activity slowly as tolerated  As directed        Medication List         acetaminophen 500 MG tablet  Commonly known as:  TYLENOL  Take 500 mg by mouth every 6 (six) hours as needed for pain.     ALPRAZolam 1 MG tablet  Commonly known as:  XANAX  Take 1 tablet (1 mg total) by mouth 2 (two) times daily.     cyclobenzaprine 10 MG tablet  Commonly known as:  FLEXERIL  Take 1 tablet (10 mg total) by mouth 3 (three) times daily as needed for muscle spasms.     DULoxetine 60 MG capsule  Commonly known as:  CYMBALTA  Take 60 mg by mouth daily.     HUMULIN 70/30 (70-30) 100 UNIT/ML injection  Generic drug:  insulin NPH-regular  Inject 70-75 Units into the skin daily. Inject 70 units q AM and 75 units q pm     HYDROcodone-acetaminophen 7.5-325 MG per tablet  Commonly known as:  NORCO  Take 1-2 tablets by mouth every 4 (four) hours as needed for moderate pain.     ketorolac 10 MG tablet  Commonly known as:  TORADOL  Take 1 tablet (10 mg total) by mouth every 6 (six) hours as needed.     lisinopril 5 MG tablet  Commonly known as:  PRINIVIL,ZESTRIL  Take 2.5 mg by mouth daily.     metFORMIN 500 MG tablet  Commonly known as:  GLUCOPHAGE  Take 1,000 mg by mouth 2 (two) times daily with a meal.     pravastatin 20 MG tablet  Commonly known as:  PRAVACHOL  Take 40 mg by mouth daily.     pravastatin 40 MG tablet  Commonly  known as:  PRAVACHOL  Take 40 mg by mouth at bedtime.     pregabalin 100 MG capsule  Commonly known as:  LYRICA  Take 200 mg by mouth 2 (two) times daily.           Follow-up Information   Follow up with BEANE,JEFFREY C, MD In 2 weeks.   Specialty:  Orthopedic Surgery   Contact information:   [address] [place] [address] [phone]       Signed: Dorothy Spark. 03/03/2013, 8:17 AM

## 2013-03-03 NOTE — Progress Notes (Signed)
Subjective: 1 Day Post-Op Procedure(s) (LRB): LEFT SHOULDER ARTHROSCOPY, SUBACROMIAL DECOMPRESSION, MANIPULATION UNDER ANESTHESIA (Left) Patient reports pain as moderate.  Sore this AM, left shoulder. Denies numbness or tingling. Did not get much sleep overnight. She did have some flushing and facial itching after receiving toradol, relieved by benadryl.   Objective: Vital signs in last 24 hours: Temp:  [97.5 F (36.4 C)-99 F (37.2 C)] 97.9 F (36.6 C) (11/14 0624) Pulse Rate:  [65-91] 73 (11/14 0624) Resp:  [14-19] 14 (11/14 0624) BP: (90-140)/(55-78) 114/78 mmHg (11/14 0624) SpO2:  [94 %-98 %] 97 % (11/14 0624) Weight:  [90.7 kg (199 lb 15.3 oz)] 90.7 kg (199 lb 15.3 oz) (11/13 1946)  Intake/Output from previous day: 11/13 0701 - 11/14 0700 In: 2240 [P.O.:360; I.V.:1825; IV Piggyback:55] Out: 2085 [Urine:2075; Blood:10] Intake/Output this shift:     Recent Labs  03/02/13 1329  HGB 13.6    Recent Labs  03/02/13 1329  WBC 14.0*  RBC 4.09  HCT 38.9  PLT 458*    Recent Labs  03/02/13 1329  CREATININE 0.58   No results found for this basename: LABPT, INR,  in the last 72 hours  Neurologically intact ABD soft Neurovascular intact Sensation intact distally Intact pulses distally Dorsiflexion/Plantar flexion intact Incision: dressing C/D/I and no drainage No cellulitis present Compartment soft no calf pain or sign of DVT  Assessment/Plan: 1 Day Post-Op Procedure(s) (LRB): LEFT SHOULDER ARTHROSCOPY, SUBACROMIAL DECOMPRESSION, MANIPULATION UNDER ANESTHESIA (Left) Advance diet Up with therapy D/C IV fluids  Discussed D/C instructions PT to see this AM prior to D/C Follow up with Dr. Shelle Iron in office in 10-14 days for suture removal Will discuss with Dr. Elissa Lovett, Dayna Barker. 03/03/2013, 8:13 AM

## 2013-03-03 NOTE — Op Note (Signed)
NAMELAYTON, TAPPAN               ACCOUNT NO.:  [id number]  MEDICAL RECORD NO.:  [id number]  LOCATION:  1607                         FACILITY:  [facility]  PHYSICIAN:  Jene Every, M.D.    DATE OF BIRTH:  1965-11-12  DATE OF PROCEDURE:  03/02/2013 DATE OF DISCHARGE:                              OPERATIVE REPORT   PREOPERATIVE DIAGNOSES:  Capsulitis impingement syndrome, partial tear of the rotator cuff.  POSTOPERATIVE DIAGNOSES:  Capsulitis impingement syndrome, partial tear of the rotator cuff.  PROCEDURE PERFORMED: 1. Exam under anesthesia. 2. Manipulation under anesthesia. 3. Left shoulder arthroscopy, subacromial decompression,     acromioplasty, and bursectomy. 4. Debridement of partial tear of the rotator cuff.  ANESTHESIA:  General.  ASSISTANT:  Lanna Poche, PA who was utilized for manipulation of the extremity, traction and managing the inflow and outflow from the arthroscopy pump.  BRIEF HISTORY:  This is a 47 year old with refractory shoulder pain to rest, activity modification, physical therapy, has minimal tear of the rotator cuff, persistent pain, diminished motion.  She was indicated for exam under anesthesia, manipulation, shoulder arthroscopy, possible open rotator cuff repair.  Risks and benefits discussed including bleeding, infection, damage to neurovascular structure, DVT, PE, anesthetic complications, and need for open repair, etc.  TECHNIQUE:  With the patient in supine position, after induction of adequate general anesthesia, 2 g Kefzol, shoulder was examined under anesthesia.  Full forward flexion, full abduction, full external rotation, and slightly decreased internal rotation.  We performed gentle internal rotation maneuver which actually increased her internal rotation which was normal.  The scapulothoracic region was stabilized during the manipulation.  Next, the left shoulder and upper extremity were prepped and draped in usual sterile  fashion.  Surgical marker was utilized to delineate the acromion, AC joint, and coracoid with the arm in 70:30 position.  We advanced the arthroscopic camera into the glenohumeral joint penetrating the capsule atraumatically in line with coracoid.  Irrigant was utilized to insufflate the joint 65 mmHg.  We lavaged the joint.  We inspected the joint.  We had normal glenoid, normal humerus.  Rotator cuff did not appear to be teared.  There were some cystic changes in the posterior aspect of the humeral head, though no detachment of the rotator cuff.  Biceps was unremarkable with no SLAP lesion or labral tear.  Subscap was unremarkable as well.  Therefore, redirected the camera in the subacromial space and we triangulated with an anterolateral cannula through an anterolateral portal with incision through the skin only.  Hypertrophic and exuberant bursa was noted in the subacromial space.  We introduced a shaver and performed a full bursectomy anteriorly, posteriorly and laterally.  In addition, slight fraying of the cuff was noted, we shaved that as well, but there was no evidence of any significant tearing of the cuff.  Released and morselized the CA ligament with the ArthroCare.  Small spur was shaved about the anterolateral aspect of the acromion.  We increased the subacromial space.  We inspected the cuff and no evidence of tear requiring repair.  Therefore, we removed all instrumentation.  Portals were closed with 4-0 nylon simple sutures, 0.25% Marcaine with epinephrine was infiltrated in the  joint.  Wound was dressed sterilely.  Awoken without difficulty and transported to recovery in satisfactory condition after placed in a sling.  The patient tolerated the procedure well.  No complications.  Minimal blood loss.     Jene Every, M.D.     Cordelia Pen  D:  03/02/2013  T:  03/03/2013  Job:  [id number]

## 2013-03-03 NOTE — Evaluation (Signed)
Occupational Therapy Evaluation Patient Details Name: [name] MRN: [id number] DOB: Jul 15, 1965 Today's Date: 03/03/2013 Time: 8657-8469 OT Time Calculation (min): 15 min  OT Assessment / Plan / Recommendation History of present illness RTC repair . OT order for ADL's and sling education.    Clinical Impression   Pt presents to OT s/ p shoulder surgery. All education complete regarding ADL activity and sling management s/p surgery    OT Assessment  Progress rehab of shoulder as ordered by MD at follow-up appointment    Follow Up Recommendations  Outpatient OT                Precautions / Restrictions Precautions Precautions: Shoulder Required Braces or Orthoses: Sling       Visit Information  Last OT Received On: 03/03/13 Assistance Needed: +1 History of Present Illness: RTC repair . OT order for ADL's and sling education.        Prior Functioning     Home Living Family/patient expects to be discharged to:: Private residence Living Arrangements: Parent            Cognition  Cognition Arousal/Alertness: Awake/alert Behavior During Therapy: WFL for tasks assessed/performed Overall Cognitive Status: Within Functional Limits for tasks assessed          Exercise Donning/doffing shirt without moving shoulder: Supervision/safety Method for sponge bathing under operated UE: Supervision/safety Donning/doffing sling/immobilizer: Supervision/safety Correct positioning of sling/immobilizer: Supervision/safety Sling wearing schedule (on at all times/off for ADL's): Supervision/safety Proper positioning of operated UE when showering: Supervision/safety Positioning of UE while sleeping: Supervision/safety      End of Session OT - End of Session Activity Tolerance: Patient tolerated treatment well Patient left: in bed;with family/visitor present;with call bell/phone within reach  GO Functional Assessment Tool Used: clinical observation Functional  Limitation: Self care Self Care Current Status (G2952): At least 20 percent but less than 40 percent impaired, limited or restricted Self Care Goal Status (W4132): At least 1 percent but less than 20 percent impaired, limited or restricted Self Care Discharge Status [phone]): At least 20 percent but less than 40 percent impaired, limited or restricted   [name], [name] 03/03/2013, 8:30 AM

## 2013-03-15 ENCOUNTER — Telehealth: Payer: Self-pay | Admitting: *Deleted

## 2013-03-15 MED ORDER — PREGABALIN 100 MG PO CAPS: 200.0000 mg | ORAL_CAPSULE | Freq: Two times a day (BID) | ORAL | Status: DC

## 2013-03-15 NOTE — Telephone Encounter (Signed)
Med refilled and called out

## 2013-03-17 ENCOUNTER — Other Ambulatory Visit: Payer: Self-pay | Admitting: *Deleted

## 2013-03-17 ENCOUNTER — Encounter: Payer: Self-pay | Admitting: *Deleted

## 2013-03-24 ENCOUNTER — Encounter: Payer: Self-pay | Admitting: Family Medicine

## 2013-04-03 ENCOUNTER — Ambulatory Visit: Payer: BC Managed Care – PPO | Admitting: Family Medicine

## 2013-05-03 ENCOUNTER — Encounter: Payer: Self-pay | Admitting: Family Medicine

## 2013-05-09 ENCOUNTER — Other Ambulatory Visit (HOSPITAL_COMMUNITY): Payer: Self-pay | Admitting: Specialist

## 2013-05-09 DIAGNOSIS — M25512 Pain in left shoulder: Secondary | ICD-10-CM

## 2013-05-12 ENCOUNTER — Encounter (HOSPITAL_COMMUNITY): Payer: Self-pay

## 2013-05-12 ENCOUNTER — Ambulatory Visit (HOSPITAL_COMMUNITY)
Admission: RE | Admit: 2013-05-12 | Discharge: 2013-05-12 | Disposition: A | Discharge status: Home / Self Care | Payer: BC Managed Care – PPO | Source: Ambulatory Visit | Attending: Specialist | Admitting: Specialist

## 2013-05-12 DIAGNOSIS — M25519 Pain in unspecified shoulder: Secondary | ICD-10-CM | POA: Insufficient documentation

## 2013-05-12 DIAGNOSIS — M25512 Pain in left shoulder: Secondary | ICD-10-CM

## 2013-05-12 DIAGNOSIS — S46819A Strain of other muscles, fascia and tendons at shoulder and upper arm level, unspecified arm, initial encounter: Secondary | ICD-10-CM | POA: Insufficient documentation

## 2013-05-12 DIAGNOSIS — W19XXXA Unspecified fall, initial encounter: Secondary | ICD-10-CM | POA: Insufficient documentation

## 2013-05-21 DIAGNOSIS — J069 Acute upper respiratory infection, unspecified: Secondary | ICD-10-CM

## 2013-05-21 HISTORY — DX: Acute upper respiratory infection, unspecified: J06.9

## 2013-05-24 ENCOUNTER — Ambulatory Visit (INDEPENDENT_AMBULATORY_CARE_PROVIDER_SITE_OTHER): Payer: Self-pay | Admitting: Family Medicine

## 2013-05-24 VITALS — BP 110/80 | HR 78 | Temp 97.7°F | Resp 18 | Ht 62.0 in | Wt 194.0 lb

## 2013-05-24 DIAGNOSIS — E1165 Type 2 diabetes mellitus with hyperglycemia: Secondary | ICD-10-CM

## 2013-05-24 DIAGNOSIS — F4321 Adjustment disorder with depressed mood: Secondary | ICD-10-CM

## 2013-05-24 DIAGNOSIS — I10 Essential (primary) hypertension: Secondary | ICD-10-CM

## 2013-05-24 DIAGNOSIS — IMO0002 Reserved for concepts with insufficient information to code with codable children: Secondary | ICD-10-CM

## 2013-05-24 DIAGNOSIS — M75120 Complete rotator cuff tear or rupture of unspecified shoulder, not specified as traumatic: Secondary | ICD-10-CM

## 2013-05-24 DIAGNOSIS — M75100 Unspecified rotator cuff tear or rupture of unspecified shoulder, not specified as traumatic: Secondary | ICD-10-CM

## 2013-05-24 DIAGNOSIS — M797 Fibromyalgia: Secondary | ICD-10-CM

## 2013-05-24 DIAGNOSIS — IMO0001 Reserved for inherently not codable concepts without codable children: Secondary | ICD-10-CM

## 2013-05-24 DIAGNOSIS — H9209 Otalgia, unspecified ear: Secondary | ICD-10-CM

## 2013-05-24 MED ORDER — LIDOCAINE 5 % EX PTCH: 1.0000 | MEDICATED_PATCH | CUTANEOUS | Status: DC

## 2013-05-24 MED ORDER — ANTIPYRINE-BENZOCAINE 5.4-1.4 % OT SOLN: 3.0000 [drp] | OTIC | Status: DC | PRN

## 2013-05-24 MED ORDER — PREGABALIN 200 MG PO CAPS: 200.0000 mg | ORAL_CAPSULE | Freq: Two times a day (BID) | ORAL | Status: DC

## 2013-05-24 NOTE — Patient Instructions (Signed)
Increase lyrica 200mg  twice a day Lidocain patch Ear drop as needed for next 3-5 days Okay to use xanax as needed Refills to be done  F/U 3 months

## 2013-05-25 ENCOUNTER — Telehealth: Payer: Self-pay | Admitting: *Deleted

## 2013-05-25 DIAGNOSIS — H9209 Otalgia, unspecified ear: Secondary | ICD-10-CM | POA: Insufficient documentation

## 2013-05-25 MED ORDER — METFORMIN HCL 500 MG PO TABS: 1000.0000 mg | ORAL_TABLET | Freq: Two times a day (BID) | ORAL | Status: DC

## 2013-05-25 MED ORDER — DULOXETINE HCL 60 MG PO CPEP: 60.0000 mg | ORAL_CAPSULE | Freq: Every day | ORAL | Status: DC

## 2013-05-25 MED ORDER — ALPRAZOLAM 1 MG PO TABS: 1.0000 mg | ORAL_TABLET | Freq: Two times a day (BID) | ORAL | Status: DC

## 2013-05-25 MED ORDER — CYCLOBENZAPRINE HCL 10 MG PO TABS: 10.0000 mg | ORAL_TABLET | Freq: Two times a day (BID) | ORAL | Status: DC | PRN

## 2013-05-25 MED ORDER — PRAVASTATIN SODIUM 40 MG PO TABS: 40.0000 mg | ORAL_TABLET | Freq: Every day | ORAL | Status: DC

## 2013-05-25 NOTE — Assessment & Plan Note (Signed)
No sign of acute infection, AB otic, may have some pressure from sinus drainage with weather

## 2013-05-25 NOTE — Assessment & Plan Note (Signed)
Pt had hypotensive symptoms even with low dose ACEI, therefore no meds at this time

## 2013-05-25 NOTE — Telephone Encounter (Signed)
Pt called stating that her medicine is going to cost her around 400 dollars for all of them except Metformin and Insulin, wants to know if you can prescribe her something different adn reasonable.Says the metformin she can get for 4dollars at [place] but dont see any of the other meds on the [facility] list.

## 2013-05-25 NOTE — Assessment & Plan Note (Signed)
Followed by ortho, trial of lidocaine patches, since she does not tolerate narcotics

## 2013-05-25 NOTE — Assessment & Plan Note (Signed)
Continue cymbalta and xanax

## 2013-05-25 NOTE — Progress Notes (Signed)
Patient ID: [name], female   DOB: December 09, 1965, 48 y.o.   MRN: [id number]   Subjective:    Patient ID: [name], female    DOB: 04/10/1966, 48 y.o.   MRN: [id number]  Patient presents for 3 mos follow up  Currently followed by ortho for the left shoulder rotator cuff tear, has done home PT, with minimal improvement, she has not tolerated hydrocodone and has allergy to other meds due to severe itching desptite benadryl, asked for another med Fibromyalgia has been worse was up to 200mg  BID by neuro in the past, her left side where she has the shoulder injury flares up causing shooting pains and occ red streaks down arm Right ear pain past 3 days, no drainage no change in hearing, no sinus pressure no URI symptoms DM- followed by endocrine now taking 50 units BID, no recently hypoglycemia but sugars fluctuate   Review Of Systems:  GEN- denies fatigue, fever, weight loss,weakness, recent illness HEENT- denies eye drainage, change in vision, nasal discharge, CVS- denies chest pain, palpitations RESP- denies SOB, cough, wheeze ABD- denies N/V, change in stools, abd pain GU- denies dysuria, hematuria, dribbling, incontinence MSK- + joint pain,+ muscle aches, injury Neuro- denies headache, dizziness, syncope, seizure activity       Objective:    BP 110/80  Pulse 78  Temp(Src) 97.7 F (36.5 C) (Oral)  Resp 18  Ht 5\' 2"  (1.575 m)  Wt 194 lb (87.998 kg)  BMI 35.47 kg/m2 GEN- NAD, alert and oriented x3 HEENT- PERRL, EOMI, non injected sclera, pink conjunctiva, MMM, oropharynx clear, TM clear bilat no effusion Neck- Supple, no thyromegaly CVS- RRR, no murmur RESP-CTAB EXT- No edema Pulses- Radial, DP- 2+ Foot- normal monofilament, no callus seen, nails thickened Psych- normal affect and mood      Assessment & Plan:      Problem List Items Addressed This Visit   Rotator cuff tear, non-traumatic     Followed by ortho, trial of lidocaine patches, since she does not  tolerate narcotics    Otalgia - Primary     No sign of acute infection, AB otic, may have some pressure from sinus drainage with weather    Hypertension     Pt had hypotensive symptoms even with low dose ACEI, therefore no meds at this time    Relevant Medications      pravastatin (PRAVACHOL) tablet   Fibromyalgia     Increase lyrica to 200mg  BID    Diabetes type 2, uncontrolled     F/U endocrine,no ACEI due to intolerance ON statin Labs to be done in a few weeks, endocrine will forward results to me    Relevant Medications      metFORMIN (GLUCOPHAGE) tablet   Adjustment disorder with depressed mood     Continue cymbalta and xanax       Note: This dictation was prepared with Dragon dictation along with smaller phrase technology. Any transcriptional errors that result from this process are unintentional.

## 2013-05-25 NOTE — Assessment & Plan Note (Signed)
Increase lyrica to 200mg  BID

## 2013-05-25 NOTE — Assessment & Plan Note (Signed)
F/U endocrine,no ACEI due to intolerance ON statin Labs to be done in a few weeks, endocrine will forward results to me

## 2013-05-26 MED ORDER — METFORMIN HCL 1000 MG PO TABS: 1000.0000 mg | ORAL_TABLET | Freq: Two times a day (BID) | ORAL | Status: DC

## 2013-05-26 MED ORDER — ANTIPYRINE-BENZOCAINE 5.4-1.4 % OT SOLN: 3.0000 [drp] | OTIC | Status: DC | PRN

## 2013-05-26 MED ORDER — PRAVASTATIN SODIUM 40 MG PO TABS: 40.0000 mg | ORAL_TABLET | Freq: Every day | ORAL | Status: DC

## 2013-05-26 MED ORDER — AMITRIPTYLINE HCL 50 MG PO TABS: 50.0000 mg | ORAL_TABLET | Freq: Every day | ORAL | Status: DC

## 2013-05-26 MED ORDER — CYCLOBENZAPRINE HCL 10 MG PO TABS: 10.0000 mg | ORAL_TABLET | Freq: Two times a day (BID) | ORAL | Status: DC | PRN

## 2013-05-26 NOTE — Telephone Encounter (Signed)
Please let pt know-   Due to insurance Lost  I dont have another alternative to Lyrica or the lidoderm patch, she may have to premedicate with benadryl before using her hydrocodone given by surgeon or use plain tylenol   Amitryptline at bedtime = Cymbalta Metformin changed to 1000mg  tabs  Other meds the same get from [facility]

## 2013-05-26 NOTE — Telephone Encounter (Signed)
Script sent to [facility] in [place] 

## 2013-05-26 NOTE — Telephone Encounter (Signed)
Call her pharmacy does she not have insurance coverage as she has multiple medications, see which medications are the issue

## 2013-05-26 NOTE — Telephone Encounter (Signed)
i called her pharmacy [facility] to compare prices from [place] is cheaper on all of meds except her Cymbalta which will cost her 128.13, pt wants to know d/t not having insurance is there something cheaper that you can call in for cymbalta.

## 2013-07-23 ENCOUNTER — Encounter (HOSPITAL_COMMUNITY): Payer: Self-pay | Admitting: Emergency Medicine

## 2013-07-23 ENCOUNTER — Emergency Department (HOSPITAL_COMMUNITY)
Admission: EM | Admit: 2013-07-23 | Discharge: 2013-07-23 | Disposition: A | Discharge status: Home / Self Care | Payer: Self-pay | Attending: Emergency Medicine | Admitting: Emergency Medicine

## 2013-07-23 DIAGNOSIS — E7800 Pure hypercholesterolemia, unspecified: Secondary | ICD-10-CM | POA: Insufficient documentation

## 2013-07-23 DIAGNOSIS — I10 Essential (primary) hypertension: Secondary | ICD-10-CM | POA: Insufficient documentation

## 2013-07-23 DIAGNOSIS — G609 Hereditary and idiopathic neuropathy, unspecified: Secondary | ICD-10-CM | POA: Insufficient documentation

## 2013-07-23 DIAGNOSIS — Z79899 Other long term (current) drug therapy: Secondary | ICD-10-CM | POA: Insufficient documentation

## 2013-07-23 DIAGNOSIS — E756 Lipid storage disorder, unspecified: Secondary | ICD-10-CM | POA: Insufficient documentation

## 2013-07-23 DIAGNOSIS — Z8619 Personal history of other infectious and parasitic diseases: Secondary | ICD-10-CM | POA: Insufficient documentation

## 2013-07-23 DIAGNOSIS — Z794 Long term (current) use of insulin: Secondary | ICD-10-CM | POA: Insufficient documentation

## 2013-07-23 DIAGNOSIS — Z862 Personal history of diseases of the blood and blood-forming organs and certain disorders involving the immune mechanism: Secondary | ICD-10-CM | POA: Insufficient documentation

## 2013-07-23 DIAGNOSIS — E119 Type 2 diabetes mellitus without complications: Secondary | ICD-10-CM | POA: Insufficient documentation

## 2013-07-23 DIAGNOSIS — M25519 Pain in unspecified shoulder: Secondary | ICD-10-CM | POA: Insufficient documentation

## 2013-07-23 DIAGNOSIS — Z87891 Personal history of nicotine dependence: Secondary | ICD-10-CM | POA: Insufficient documentation

## 2013-07-23 DIAGNOSIS — Z96619 Presence of unspecified artificial shoulder joint: Secondary | ICD-10-CM | POA: Insufficient documentation

## 2013-07-23 DIAGNOSIS — G8929 Other chronic pain: Secondary | ICD-10-CM | POA: Insufficient documentation

## 2013-07-23 DIAGNOSIS — IMO0001 Reserved for inherently not codable concepts without codable children: Secondary | ICD-10-CM | POA: Insufficient documentation

## 2013-07-23 NOTE — Discharge Instructions (Signed)
Please read and follow all provided instructions.  Your diagnoses today include:  1. Shoulder pain    Tests performed today include:  Vital signs. See below for your results today.   Medications prescribed:   None  Home care instructions:   Follow any educational materials contained in this packet  Follow R.I.C.E. Protocol:  R - rest your injury   I  - use ice on injury without applying directly to skin  C - compress injury with bandage or splint  E - elevate the injury as much as possible  Follow-up instructions: Please follow-up with Dr. Tonita Cong this week as planned.    Return instructions:   Please return if your fingers are numb or tingling, appear gray or blue, or you have worsening severe pain (also elevate leg and loosen splint or wrap if you were given one)  Please return to the Emergency Department if you experience worsening symptoms.   Please return if you have any other emergent concerns.  Additional Information:  Your vital signs today were: BP 147/68   Pulse 81   Temp(Src) 97.6 F (36.4 C) (Oral)   Resp 18   SpO2 99% If your blood pressure (BP) was elevated above 135/85 this visit, please have this repeated by your doctor within one month.

## 2013-07-23 NOTE — ED Notes (Signed)
Ortho specialist at bedside to consult.

## 2013-07-23 NOTE — Consult Note (Signed)
Reason for Consult:Left Shoulder pain Referring Physician: EDOP  [name] is an 48 y.o. female.  HPI: Increasing pain with hx partial tear RC severe  Past Medical History  Diagnosis Date  . Diabetes mellitus   . Hypertension   . High cholesterol   . Clostridium difficile colitis   . Sea-blue histiocyte syndrome   . Leukocytosis     Dr [name]  . Fibromyalgia   . Neuropathy, peripheral     Past Surgical History  Procedure Laterality Date  . Splenectomy    . Tonsillectomy    . Abdominal hysterectomy      partial  . Esophagogastroduodenoscopy  08/27/2011    Rourk-reactive gastropathy, normal, patent esophagus. Stomach empty. Multiple linear antral erosions. No ulcer or infiltrating process. Small hiatal hernia. Patent pylorus. Normal first and second portion of the duodenum  . Colonoscopy  12/07/2011    Rourk-normal rectum,, colon and terminal ileum  . Shoulder arthroscopy with open rotator cuff repair Left 03/02/2013    Procedure: LEFT SHOULDER ARTHROSCOPY, SUBACROMIAL DECOMPRESSION, MANIPULATION UNDER ANESTHESIA;  Surgeon: Patricia Hai, MD;  Location: [facility];  Service: Orthopedics;  Laterality: Left;    Family History  Problem Relation Age of Onset  . Colon cancer Neg Hx   . Arthritis Mother   . Diabetes Father   . Kidney disease Father     Social History:  reports that she quit smoking about 7 years ago. Her smoking use included Cigarettes. She has a 30 pack-year smoking history. She has never used smokeless tobacco. She reports that she does not drink alcohol or use illicit drugs.  Allergies:  Allergies  Allergen Reactions  . Aspartame And Phenylalanine Shortness Of Breath and Swelling    Throat swells  ANY ARTIFICAL SWEETNERS  . Toradol [Ketorolac Tromethamine] Other (See Comments)    Face & neck flushed red & pt felt very hot after IV adm.  . Sucralose Rash    Pt.s face broke out in rash after drinking Breeza.  Pt. Claims it is from the artificial  sweetener.     . Dilaudid [Hydromorphone Hcl]     Itch  . Hydrocodone Itching  . Oxycodone Rash    itching    Medications: I have reviewed the patient's current medications.  No results found for this or any previous visit (from the past 48 hour(s)).  No results found.  Review of Systems  Musculoskeletal: Positive for joint pain.  All other systems reviewed and are negative.   Blood pressure 147/68, pulse 81, temperature 97.6 F (36.4 Foster), temperature source Oral, resp. rate 18, SpO2 99.00%. Physical Exam  Constitutional: She is oriented to person, place, and time. She appears well-developed.  HENT:  Head: Normocephalic.  Eyes: Pupils are equal, round, and reactive to light.  Neck: Normal range of motion.  Cardiovascular: Normal rate.   Respiratory: Effort normal.  GI: Soft.  Musculoskeletal: She exhibits tenderness. She exhibits no edema.  +impingement left shoulder. Weak Er. NVI. No DVT. No Spurling  Neurological: She is alert and oriented to person, place, and time.  Skin: Skin is warm and dry.  Psychiatric: She has a normal mood and affect.    Assessment/Plan: Progressive tearing of partial tear RC nonhealing Hx DM Plan RCR SAD Risks discussed. Pain medicine at home [name] See Dr. Tonita Foster this week OK to D/Foster. Sling Pendulum exercises.  [name] 07/23/2013, 12:27 PM

## 2013-07-23 NOTE — Consult Note (Deleted)
Reason for Consult:Left Shoulder pain Referring Physician: EDOP  [name] is an 48 y.o. female.  HPI: Increasing pain with hx partial tear RC severe  Past Medical History  Diagnosis Date  . Diabetes mellitus   . Hypertension   . High cholesterol   . Clostridium difficile colitis   . Sea-blue histiocyte syndrome   . Leukocytosis     Dr [name]  . Fibromyalgia   . Neuropathy, peripheral     Past Surgical History  Procedure Laterality Date  . Splenectomy    . Tonsillectomy    . Abdominal hysterectomy      partial  . Esophagogastroduodenoscopy  08/27/2011    Rourk-reactive gastropathy, normal, patent esophagus. Stomach empty. Multiple linear antral erosions. No ulcer or infiltrating process. Small hiatal hernia. Patent pylorus. Normal first and second portion of the duodenum  . Colonoscopy  12/07/2011    Rourk-normal rectum,, colon and terminal ileum  . Shoulder arthroscopy with open rotator cuff repair Left 03/02/2013    Procedure: LEFT SHOULDER ARTHROSCOPY, SUBACROMIAL DECOMPRESSION, MANIPULATION UNDER ANESTHESIA;  Surgeon: Patricia Hai, MD;  Location: [facility];  Service: Orthopedics;  Laterality: Left;    Family History  Problem Relation Age of Onset  . Colon cancer Neg Hx   . Arthritis Mother   . Diabetes Father   . Kidney disease Father     Social History:  reports that she quit smoking about 7 years ago. Her smoking use included Cigarettes. She has a 30 pack-year smoking history. She has never used smokeless tobacco. She reports that she does not drink alcohol or use illicit drugs.  Allergies:  Allergies  Allergen Reactions  . Aspartame And Phenylalanine Shortness Of Breath and Swelling    Throat swells  ANY ARTIFICAL SWEETNERS  . Toradol [Ketorolac Tromethamine] Other (See Comments)    Face & neck flushed red & pt felt very hot after IV adm.  . Sucralose Rash    Pt.s face broke out in rash after drinking Breeza.  Pt. Claims it is from the artificial  sweetener.     . Dilaudid [Hydromorphone Hcl]     Itch  . Hydrocodone Itching  . Oxycodone Rash    itching    Medications: I have reviewed the patient's current medications.  No results found for this or any previous visit (from the past 48 hour(s)).  No results found.  Review of Systems  Musculoskeletal: Positive for joint pain.  All other systems reviewed and are negative.   Blood pressure 147/68, pulse 81, temperature 97.6 F (36.4 C), temperature source Oral, resp. rate 18, SpO2 99.00%. Physical Exam  Constitutional: She is oriented to person, place, and time. She appears well-developed.  HENT:  Head: Normocephalic.  Eyes: Pupils are equal, round, and reactive to light.  Neck: Normal range of motion.  Cardiovascular: Normal rate.   Respiratory: Effort normal.  GI: Soft.  Musculoskeletal: She exhibits tenderness. She exhibits no edema.  +impingement left shoulder. Weak Er. NVI. No DVT. No Spurling  Neurological: She is alert and oriented to person, place, and time.  Skin: Skin is warm and dry.  Psychiatric: She has a normal mood and affect.    Assessment/Plan: Progressive tearing of partial tear RC nonhealing Hx DM Plan RCR SAD Risks discussed. Pain medicine at home [facility] See Dr. Tonita Foster this week OK to D/C. Sling Pendulum exercises.  [name] C 07/23/2013, 12:21 PM

## 2013-07-23 NOTE — ED Provider Notes (Signed)
CSN: [id number]     Arrival date & time 07/23/13  1025 History   First MD Initiated Contact with Patient 07/23/13 1043     Chief Complaint  Patient presents with  . Shoulder Pain     (Consider location/radiation/quality/duration/timing/severity/associated sxs/prior Treatment) HPI Comments: Patient with h/o adhesive capsulitis, partial rotation cuff tear, repaired by Dr. Tonita Cong 02/2013 -- presents with complaint of chronic pain in her left shoulder which has been ongoing since before the surgery. She has had continued pain. She's been on Lidoderm patches, Flexeril, Lyrica. She is intolerant to narcotic medications and cannot take them. Any movement causes the pain to be worse. Pain was worse this morning. Pt states that she spoke with Dr. Tonita Cong who is on-call today. She was asked to come to the ED to be seen by him. She denies numbness/tingling. No color change. Pain is localized to shoulder. The onset of this condition was insidious. The course is constant. Aggravating factors: movement. Alleviating factors: none.    Patient is a 48 y.o. female presenting with shoulder pain. The history is provided by the patient and medical records.  Shoulder Pain Associated symptoms include arthralgias. Pertinent negatives include no joint swelling, neck pain, numbness or weakness.    Past Medical History  Diagnosis Date  . Diabetes mellitus   . Hypertension   . High cholesterol   . Clostridium difficile colitis   . Sea-blue histiocyte syndrome   . Leukocytosis     Dr Tressie Stalker  . Fibromyalgia   . Neuropathy, peripheral    Past Surgical History  Procedure Laterality Date  . Splenectomy    . Tonsillectomy    . Abdominal hysterectomy      partial  . Esophagogastroduodenoscopy  08/27/2011    Rourk-reactive gastropathy, normal, patent esophagus. Stomach empty. Multiple linear antral erosions. No ulcer or infiltrating process. Small hiatal hernia. Patent pylorus. Normal first and second portion of the  duodenum  . Colonoscopy  12/07/2011    Rourk-normal rectum,, colon and terminal ileum  . Shoulder arthroscopy with open rotator cuff repair Left 03/02/2013    Procedure: LEFT SHOULDER ARTHROSCOPY, SUBACROMIAL DECOMPRESSION, MANIPULATION UNDER ANESTHESIA;  Surgeon: [name], MD;  Location: [facility];  Service: Orthopedics;  Laterality: Left;   Family History  Problem Relation Age of Onset  . Colon cancer Neg Hx   . Arthritis Mother   . Diabetes Father   . Kidney disease Father    History  Substance Use Topics  . Smoking status: Former Smoker -- 1.00 packs/day for 30 years    Types: Cigarettes    Quit date: 04/20/2006  . Smokeless tobacco: Never Used  . Alcohol Use: No   OB History   Grav Para Term Preterm Abortions TAB SAB Ect Mult Living                 Review of Systems  Constitutional: Positive for activity change.  Musculoskeletal: Positive for arthralgias. Negative for back pain, joint swelling and neck pain.  Skin: Negative for wound.  Neurological: Negative for weakness and numbness.   Allergies  Aspartame and phenylalanine; Toradol; Sucralose; Dilaudid; Hydrocodone; and Oxycodone  Home Medications   Current Outpatient Rx  Name  Route  Sig  Dispense  Refill  . acetaminophen (TYLENOL) 500 MG tablet   Oral   Take 500 mg by mouth every 6 (six) hours as needed for pain.         Marland Kitchen ALPRAZolam (XANAX) 1 MG tablet   Oral   Take  1 tablet (1 mg total) by mouth 2 (two) times daily.   30 tablet   3   . amitriptyline (ELAVIL) 50 MG tablet   Oral   Take 1 tablet (50 mg total) by mouth at bedtime.   30 tablet   6   . cyclobenzaprine (FLEXERIL) 10 MG tablet   Oral   Take 1 tablet (10 mg total) by mouth 2 (two) times daily as needed for muscle spasms.   30 tablet   2   . insulin NPH-regular (HUMULIN 70/30) (70-30) 100 UNIT/ML injection   Subcutaneous   Inject 50 Units into the skin 2 (two) times daily with a meal.          . metFORMIN (GLUCOPHAGE) 1000 MG  tablet   Oral   Take 1 tablet (1,000 mg total) by mouth 2 (two) times daily with a meal.   60 tablet   6   . pravastatin (PRAVACHOL) 40 MG tablet   Oral   Take 1 tablet (40 mg total) by mouth at bedtime.   30 tablet   6   . pregabalin (LYRICA) 200 MG capsule   Oral   Take 1 capsule (200 mg total) by mouth 2 (two) times daily.   60 capsule   3    BP 147/68  Pulse 81  Temp(Src) 97.6 F (36.4 C) (Oral)  Resp 18  SpO2 99%  Physical Exam  Nursing note and vitals reviewed. Constitutional: She appears well-developed and well-nourished.  HENT:  Head: Normocephalic and atraumatic.  Eyes: Pupils are equal, round, and reactive to light.  Neck: Normal range of motion. Neck supple.  Cardiovascular: Exam reveals no decreased pulses.   Musculoskeletal: She exhibits tenderness. She exhibits no edema.       Left shoulder: She exhibits decreased range of motion, tenderness and bony tenderness. She exhibits no swelling.       Left elbow: Normal.       Left wrist: Normal.       Left upper arm: She exhibits no tenderness.       Left forearm: Normal.       Left hand: Normal.  L arm is neurovascularly intact distally. 2+ radial pulses. Cap refill < 2s. Skin warm. Distal sensation intact.   Neurological: She is alert. No sensory deficit.  Motor, sensation, and vascular distal to the injury is fully intact.   Skin: Skin is warm and dry.  Psychiatric: She has a normal mood and affect.    ED Course  Procedures (including critical care time) Labs Review Labs Reviewed - No data to display Imaging Review No results found.   EKG Interpretation None      10:47 AM Patient seen and examined. D/w Dr. Stevie Kern.    Vital signs reviewed and are as follows: Filed Vitals:   07/23/13 1044  BP: 147/68  Pulse: 81  Temp: 97.6 F (36.4 C)  Resp: 18   11:23 AM I spoke with Dr. Tonita Cong who will see in ED.   12:55 PM Pt seen. She is OK for d/c to home. She will f/u in office this week.      MDM   Final diagnoses:  Shoulder pain   Pt with chronic shoulder pain. UE neurovascularly intact. Seen by her orthopedist in ED. F/u in office.     Carlisle Cater, PA-C 07/23/13 1256

## 2013-07-23 NOTE — ED Notes (Signed)
Pt presents to department for evaluation of L shoulder pain. States surgery to L shoulder in November. Sent to ED by Dr. Tonita Cong. States increased pain, no relief with medications at home. Pt states chronic pain with L shoulder since surgery, 9/10 pain upon arrival. Able to wiggle digits. Capillary refill less than 2 seconds. Sling in place upon arrival.

## 2013-07-24 ENCOUNTER — Other Ambulatory Visit: Payer: Self-pay | Admitting: Orthopedic Surgery

## 2013-07-24 ENCOUNTER — Encounter (HOSPITAL_COMMUNITY): Payer: Self-pay | Admitting: Pharmacy Technician

## 2013-07-25 ENCOUNTER — Encounter (HOSPITAL_COMMUNITY)
Admission: RE | Admit: 2013-07-25 | Discharge: 2013-07-25 | Disposition: A | Discharge status: Home / Self Care | Payer: Self-pay | Source: Ambulatory Visit | Attending: Specialist | Admitting: Specialist

## 2013-07-25 ENCOUNTER — Other Ambulatory Visit: Payer: Self-pay | Admitting: Orthopedic Surgery

## 2013-07-25 ENCOUNTER — Encounter (HOSPITAL_COMMUNITY): Payer: Self-pay

## 2013-07-25 DIAGNOSIS — Z01812 Encounter for preprocedural laboratory examination: Secondary | ICD-10-CM | POA: Insufficient documentation

## 2013-07-25 HISTORY — DX: Personal history of other diseases of the digestive system: Z87.19

## 2013-07-25 HISTORY — DX: Gastro-esophageal reflux disease without esophagitis: K21.9

## 2013-07-25 HISTORY — DX: Anxiety disorder, unspecified: F41.9

## 2013-07-25 HISTORY — DX: Acute upper respiratory infection, unspecified: J06.9

## 2013-07-25 LAB — CBC
HCT: 44.7 % (ref 36.0–46.0)
Hemoglobin: 15.5 g/dL — ABNORMAL HIGH (ref 12.0–15.0)
MCH: 32.1 pg (ref 26.0–34.0)
MCHC: 34.7 g/dL (ref 30.0–36.0)
MCV: 92.5 fL (ref 78.0–100.0)
Platelets: 399 10*3/uL (ref 150–400)
RBC: 4.83 MIL/uL (ref 3.87–5.11)
RDW: 16.3 % — ABNORMAL HIGH (ref 11.5–15.5)
WBC: 14.2 10*3/uL — ABNORMAL HIGH (ref 4.0–10.5)

## 2013-07-25 LAB — BASIC METABOLIC PANEL
BUN: 10 mg/dL (ref 6–23)
CO2: 26 mEq/L (ref 19–32)
Calcium: 9.7 mg/dL (ref 8.4–10.5)
Chloride: 97 mEq/L (ref 96–112)
Creatinine, Ser: 0.55 mg/dL (ref 0.50–1.10)
GFR calc Af Amer: >90 mL/min (ref >90)
GFR calc non Af Amer: >90 mL/min (ref >90)
Glucose, Bld: 279 mg/dL — ABNORMAL HIGH (ref 70–99)
Potassium: 4.3 mEq/L (ref 3.7–5.3)
Sodium: 137 mEq/L (ref 137–147)

## 2013-07-25 NOTE — Progress Notes (Signed)
Chest 11/14, EKG 10/14, LOV Dr Buelah Manis 2/15 EPIC

## 2013-07-25 NOTE — Patient Instructions (Addendum)
      Your procedure is scheduled on:  07/27/13 THURSDAY  Report to [facility] at 10:00 AM.  Call this number if you have problems the morning of surgery: [place] Wednesday NIGHT-  HAVE SNACK BEFORE BEDTIME OR MIDNIGHT Wednesday NIGHT   Do not eat foodAfter Midnight.WEDNESDAY NIGHT.  MAY HAVE CLEAR LIQUIDS Thursday MORNING UNTIL 0630 AM-  THEN NOTHING BY MOUTH DO NOT TAKE ANY INSULIN Thursday MORNING   Take these medicines the morning of surgery with A SIP OF WATER: LYRICA, Zantac May take Alprazolam, /Flexaril if needed   .  Contacts, dentures or partial plates, or metal hairpins  can not be worn to surgery. Your family will be responsible for glasses, dentures, hearing aides while you are in surgery  Leave suitcase in the car. After surgery it may be brought to your room.  For patients admitted to the hospital, checkout time is 11:00 AM day of  discharge.                DO NOT WEAR JEWELRY, LOTIONS, POWDERS, OR PERFUMES.  WOMEN-- DO NOT SHAVE LEGS OR UNDERARMS FOR 48 HOURS BEFORE SHOWERS. MEN MAY SHAVE FACE.  Patients discharged the day of surgery will not be allowed to drive home. IF going home the day of surgery, you must have a driver and someone to stay with you for the first 24 hours  Name and phone number of your driver:  Overnight stay                                                                      Please read over the following fact sheets that you were given: [employer] [place]                                                  Patient Signature _____________________________

## 2013-07-25 NOTE — Progress Notes (Addendum)
Spoke with jackalyn bissett  from dr beane's office, dr bane is aware glucose is elevated and will be checked day of surgery.

## 2013-07-25 NOTE — ED Provider Notes (Signed)
Medical screening examination/treatment/procedure(s) were performed by non-physician practitioner and as supervising physician I was immediately available for consultation/collaboration.  Babette Relic, MD 07/25/13 1434

## 2013-07-25 NOTE — Progress Notes (Signed)
Faxed cbc, bmet to Dr Tonita Cong via epic.  Spoke with Angie at [facility] requesting her to call up to Dr Jen Mow pod to be sure her labs were received and reviewed.  Angie stated there is no answer in pod or [place] [facility].  Stated she would continue to try them.  I requested verification with call back from them that they received labs and reviewed 1100 07/25/13

## 2013-07-26 ENCOUNTER — Other Ambulatory Visit: Payer: Self-pay | Admitting: Orthopedic Surgery

## 2013-07-26 NOTE — H&P (Signed)
[name] is an 48 y.o. female.   Chief Complaint:  L shoulder pain HPI: 21 weeks s/p L SA/SAD/EUA/MUA with debridement of interstitial RCT, ongoing post-op pain, restricted motion, adhesive capsulitis, refractory to PT, HEP, pain medications, NSAIDs, steroid and lidocaine injections, relative rest, activity modifications. She is diabetic. Seen by Dr. Tonita Cong in [facility] ER this past weekend due to increased pain, due to her ongoing symptoms, they mutually decided to proceed with repair.  Past Medical History  Diagnosis Date  . Diabetes mellitus   . Hypertension   . High cholesterol   . Clostridium difficile colitis   . Sea-blue histiocyte syndrome   . Leukocytosis     Dr Tressie Stalker  . Fibromyalgia   . Neuropathy, peripheral   . Upper respiratory infection 2/15    states has dry non productive cough since then- "tickle in my throat" no fever  . Anxiety   . GERD (gastroesophageal reflux disease)   . H/O hiatal hernia     Past Surgical History  Procedure Laterality Date  . Splenectomy    . Tonsillectomy    . Abdominal hysterectomy      partial  . Esophagogastroduodenoscopy  08/27/2011    Rourk-reactive gastropathy, normal, patent esophagus. Stomach empty. Multiple linear antral erosions. No ulcer or infiltrating process. Small hiatal hernia. Patent pylorus. Normal first and second portion of the duodenum  . Colonoscopy  12/07/2011    Rourk-normal rectum,, colon and terminal ileum  . Shoulder arthroscopy with open rotator cuff repair Left 03/02/2013    Procedure: LEFT SHOULDER ARTHROSCOPY, SUBACROMIAL DECOMPRESSION, MANIPULATION UNDER ANESTHESIA;  Surgeon: [name], MD;  Location: [facility];  Service: Orthopedics;  Laterality: Left;    Family History  Problem Relation Age of Onset  . Colon cancer Neg Hx   . Arthritis Mother   . Diabetes Father   . Kidney disease Father    Social History:  reports that she quit smoking about 7 years ago. Her smoking use included Cigarettes.  She has a 30 pack-year smoking history. She has never used smokeless tobacco. She reports that she does not drink alcohol or use illicit drugs.  Allergies:  Allergies  Allergen Reactions  . Aspartame And Phenylalanine Shortness Of Breath and Swelling    Throat swells  ANY ARTIFICAL SWEETNERS  . Toradol [Ketorolac Tromethamine] Other (See Comments)    Face & neck flushed red & pt felt very hot after IV adm.  . Sucralose Rash    Pt.s face broke out in rash after drinking Breeza.  Pt. Claims it is from the artificial sweetener.     . Dilaudid [Hydromorphone Hcl]     Itch  . Hydrocodone Itching  . Oxycodone Rash    itching     (Not in a hospital admission)  Results for orders placed during the hospital encounter of 07/25/13 (from the past 48 hour(s))  BASIC METABOLIC PANEL     Status: Abnormal   Collection Time    07/25/13  9:25 AM      Result Value Ref Range   Sodium 137  137 - 147 mEq/L   Potassium 4.3  3.7 - 5.3 mEq/L   Chloride 97  96 - 112 mEq/L   CO2 26  19 - 32 mEq/L   Glucose, Bld 279 (*) 70 - 99 mg/dL   BUN 10  6 - 23 mg/dL   Creatinine, Ser 0.55  0.50 - 1.10 mg/dL   Calcium 9.7  8.4 - 10.5 mg/dL   GFR  calc non Af Amer >90  >90 mL/min   GFR calc Af Amer >90  >90 mL/min   Comment: (NOTE)     The eGFR has been calculated using the CKD EPI equation.     This calculation has not been validated in all clinical situations.     eGFR's persistently <90 mL/min signify possible Chronic Kidney     Disease.  CBC     Status: Abnormal   Collection Time    07/25/13  9:25 AM      Result Value Ref Range   WBC 14.2 (*) 4.0 - 10.5 K/uL   RBC 4.83  3.87 - 5.11 MIL/uL   Hemoglobin 15.5 (*) 12.0 - 15.0 g/dL   HCT 44.7  36.0 - 46.0 %   MCV 92.5  78.0 - 100.0 fL   MCH 32.1  26.0 - 34.0 pg   MCHC 34.7  30.0 - 36.0 g/dL   RDW 16.3 (*) 11.5 - 15.5 %   Platelets 399  150 - 400 K/uL   No results found.  Review of Systems  Constitutional: Negative.   HENT: Negative.   Eyes:  Negative.   Respiratory: Negative.   Cardiovascular: Negative.   Gastrointestinal: Negative.   Genitourinary: Negative.   Musculoskeletal: Positive for joint pain.  Skin: Negative.   Neurological: Negative.   Psychiatric/Behavioral: Negative.     There were no vitals taken for this visit. Physical Exam  Constitutional: She is oriented to person, place, and time. She appears well-developed and well-nourished.  HENT:  Head: Normocephalic and atraumatic.  Eyes: Conjunctivae and EOM are normal. Pupils are equal, round, and reactive to light.  Neck: Normal range of motion. Neck supple.  Cardiovascular: Normal rate and regular rhythm.   Respiratory: Effort normal and breath sounds normal.  GI: Soft. Bowel sounds are normal.  Musculoskeletal:  Decreased ROM + impingement Weakness in external rotation   Neurological: She is alert and oriented to person, place, and time. She has normal reflexes.  Skin: Skin is warm and dry.  Psychiatric: She has a normal mood and affect.     Assessment/Plan Shoulder pain status post subacromial decompression and debridement interstitial cuff tear with recurrent pain, recurrent adhesive capsulitis despite rest, activity modifications, activity modifications, and corticosteroid injections.  At this point plan to proceed with L shoulder mini-open RCR, SAD. Dr. Tonita Cong discussed risks, complications, alternatives including but not limited to DVT, PE, infx, bleeding, failure of procedure, need for secondary procedure, anesthesia risk, even death.  Will redraw fasting glucose AM of surgery  Plan L shoulder mini-open RCR, SAD   Kyliah Deanda M. Lynne Takemoto PA-C for Dr. Tonita Cong 07/26/2013, 9:27 PM

## 2013-07-27 ENCOUNTER — Observation Stay (HOSPITAL_COMMUNITY)
Admission: RE | Admit: 2013-07-27 | Discharge: 2013-07-28 | Disposition: A | Discharge status: Home / Self Care | Payer: Self-pay | Source: Ambulatory Visit | Attending: Specialist | Admitting: Specialist

## 2013-07-27 ENCOUNTER — Ambulatory Visit (HOSPITAL_COMMUNITY): Payer: Self-pay | Admitting: *Deleted

## 2013-07-27 ENCOUNTER — Encounter (HOSPITAL_COMMUNITY): Payer: Self-pay

## 2013-07-27 ENCOUNTER — Other Ambulatory Visit: Payer: Self-pay

## 2013-07-27 ENCOUNTER — Encounter (HOSPITAL_COMMUNITY): Payer: Self-pay | Admitting: *Deleted

## 2013-07-27 ENCOUNTER — Encounter (HOSPITAL_COMMUNITY)
Admission: RE | Disposition: A | Discharge status: Home / Self Care | Payer: Self-pay | Source: Ambulatory Visit | Attending: Specialist

## 2013-07-27 DIAGNOSIS — K219 Gastro-esophageal reflux disease without esophagitis: Secondary | ICD-10-CM | POA: Insufficient documentation

## 2013-07-27 DIAGNOSIS — Z79899 Other long term (current) drug therapy: Secondary | ICD-10-CM | POA: Insufficient documentation

## 2013-07-27 DIAGNOSIS — IMO0001 Reserved for inherently not codable concepts without codable children: Secondary | ICD-10-CM | POA: Insufficient documentation

## 2013-07-27 DIAGNOSIS — E119 Type 2 diabetes mellitus without complications: Secondary | ICD-10-CM | POA: Insufficient documentation

## 2013-07-27 DIAGNOSIS — X58XXXA Exposure to other specified factors, initial encounter: Secondary | ICD-10-CM | POA: Insufficient documentation

## 2013-07-27 DIAGNOSIS — F411 Generalized anxiety disorder: Secondary | ICD-10-CM | POA: Insufficient documentation

## 2013-07-27 DIAGNOSIS — S43429A Sprain of unspecified rotator cuff capsule, initial encounter: Principal | ICD-10-CM | POA: Insufficient documentation

## 2013-07-27 DIAGNOSIS — E7800 Pure hypercholesterolemia, unspecified: Secondary | ICD-10-CM | POA: Insufficient documentation

## 2013-07-27 DIAGNOSIS — Z87891 Personal history of nicotine dependence: Secondary | ICD-10-CM | POA: Insufficient documentation

## 2013-07-27 DIAGNOSIS — M75102 Unspecified rotator cuff tear or rupture of left shoulder, not specified as traumatic: Secondary | ICD-10-CM

## 2013-07-27 DIAGNOSIS — I10 Essential (primary) hypertension: Secondary | ICD-10-CM | POA: Insufficient documentation

## 2013-07-27 DIAGNOSIS — Z9089 Acquired absence of other organs: Secondary | ICD-10-CM | POA: Insufficient documentation

## 2013-07-27 DIAGNOSIS — M75122 Complete rotator cuff tear or rupture of left shoulder, not specified as traumatic: Secondary | ICD-10-CM | POA: Diagnosis present

## 2013-07-27 HISTORY — PX: SHOULDER OPEN ROTATOR CUFF REPAIR: SHX2407

## 2013-07-27 LAB — CBC
HCT: 41.9 % (ref 36.0–46.0)
Hemoglobin: 14.8 g/dL (ref 12.0–15.0)
MCH: 32.5 pg (ref 26.0–34.0)
MCHC: 35.3 g/dL (ref 30.0–36.0)
MCV: 92.1 fL (ref 78.0–100.0)
Platelets: 402 10*3/uL — ABNORMAL HIGH (ref 150–400)
RBC: 4.55 MIL/uL (ref 3.87–5.11)
RDW: 16.3 % — ABNORMAL HIGH (ref 11.5–15.5)
WBC: 20.6 10*3/uL — ABNORMAL HIGH (ref 4.0–10.5)

## 2013-07-27 LAB — GLUCOSE, CAPILLARY
Glucose-Capillary: 153 mg/dL — ABNORMAL HIGH (ref 70–99)
Glucose-Capillary: 104 mg/dL — ABNORMAL HIGH (ref 70–99)
Glucose-Capillary: 121 mg/dL — ABNORMAL HIGH (ref 70–99)
Glucose-Capillary: 234 mg/dL — ABNORMAL HIGH (ref 70–99)

## 2013-07-27 LAB — CREATININE, SERUM
Creatinine, Ser: 0.51 mg/dL (ref 0.50–1.10)
GFR calc Af Amer: >90 mL/min (ref >90)
GFR calc non Af Amer: >90 mL/min (ref >90)

## 2013-07-27 SURGERY — REPAIR, ROTATOR CUFF, OPEN
Anesthesia: General | Site: Shoulder | Laterality: Left

## 2013-07-27 MED ORDER — INSULIN ASPART 100 UNIT/ML ~~LOC~~ SOLN
0.0000 [IU] | Freq: Three times a day (TID) | SUBCUTANEOUS | Status: DC
  Administered 2013-07-27: 5 [IU] via SUBCUTANEOUS
  Administered 2013-07-28: 3 [IU] via SUBCUTANEOUS
  Administered 2013-07-28: 5 [IU] via SUBCUTANEOUS

## 2013-07-27 MED ORDER — ENOXAPARIN SODIUM 40 MG/0.4ML ~~LOC~~ SOLN: 40.0000 mg | SUBCUTANEOUS | Status: DC

## 2013-07-27 MED ORDER — AMITRIPTYLINE HCL 50 MG PO TABS
50.0000 mg | ORAL_TABLET | Freq: Every day | ORAL | Status: DC
  Administered 2013-07-27: 50 mg via ORAL
  Filled 2013-07-27 (×2): qty 1

## 2013-07-27 MED ORDER — METOCLOPRAMIDE HCL 10 MG PO TABS: 5.0000 mg | ORAL_TABLET | Freq: Three times a day (TID) | ORAL | Status: DC | PRN

## 2013-07-27 MED ORDER — PREGABALIN 100 MG PO CAPS: 300.0000 mg | ORAL_CAPSULE | Freq: Two times a day (BID) | ORAL | Status: DC

## 2013-07-27 MED ORDER — MEPERIDINE HCL 50 MG/ML IJ SOLN: 6.2500 mg | INTRAMUSCULAR | Status: DC | PRN

## 2013-07-27 MED ORDER — PROPOFOL 10 MG/ML IV BOLUS
INTRAVENOUS | Status: DC | PRN
  Administered 2013-07-27: 200 mg via INTRAVENOUS

## 2013-07-27 MED ORDER — MORPHINE SULFATE 2 MG/ML IJ SOLN
2.0000 mg | INTRAMUSCULAR | Status: DC | PRN
  Administered 2013-07-27 – 2013-07-28 (×3): 2 mg via INTRAVENOUS
  Filled 2013-07-27 (×2): qty 1

## 2013-07-27 MED ORDER — METOCLOPRAMIDE HCL 5 MG/ML IJ SOLN: 5.0000 mg | Freq: Three times a day (TID) | INTRAMUSCULAR | Status: DC | PRN

## 2013-07-27 MED ORDER — BUPIVACAINE-EPINEPHRINE 0.5% -1:200000 IJ SOLN
INTRAMUSCULAR | Status: DC | PRN
  Administered 2013-07-27: 17 mL
  Administered 2013-07-27: 3 mL

## 2013-07-27 MED ORDER — LACTATED RINGERS IV SOLN
INTRAVENOUS | Status: DC
  Administered 2013-07-27: 1000 mL via INTRAVENOUS

## 2013-07-27 MED ORDER — METFORMIN HCL 500 MG PO TABS
1000.0000 mg | ORAL_TABLET | Freq: Two times a day (BID) | ORAL | Status: DC
  Administered 2013-07-27 – 2013-07-28 (×2): 1000 mg via ORAL
  Filled 2013-07-27 (×4): qty 2

## 2013-07-27 MED ORDER — ONDANSETRON HCL 4 MG/2ML IJ SOLN
INTRAMUSCULAR | Status: DC | PRN
  Administered 2013-07-27: 4 mg via INTRAVENOUS

## 2013-07-27 MED ORDER — METOCLOPRAMIDE HCL 5 MG/ML IJ SOLN
INTRAMUSCULAR | Status: AC
  Filled 2013-07-27: qty 2

## 2013-07-27 MED ORDER — FENTANYL CITRATE 0.05 MG/ML IJ SOLN
25.0000 ug | INTRAMUSCULAR | Status: DC | PRN
  Administered 2013-07-27 (×4): 25 ug via INTRAVENOUS

## 2013-07-27 MED ORDER — ENOXAPARIN SODIUM 40 MG/0.4ML ~~LOC~~ SOLN
40.0000 mg | SUBCUTANEOUS | Status: DC
  Administered 2013-07-28: 40 mg via SUBCUTANEOUS
  Filled 2013-07-27 (×2): qty 0.4

## 2013-07-27 MED ORDER — PHENOL 1.4 % MT LIQD: 1.0000 | OROMUCOSAL | Status: DC | PRN

## 2013-07-27 MED ORDER — TRAMADOL HCL 50 MG PO TABS
50.0000 mg | ORAL_TABLET | Freq: Four times a day (QID) | ORAL | Status: DC | PRN
  Administered 2013-07-27 – 2013-07-28 (×4): 100 mg via ORAL
  Filled 2013-07-27 (×4): qty 2

## 2013-07-27 MED ORDER — LIDOCAINE HCL (CARDIAC) 20 MG/ML IV SOLN
INTRAVENOUS | Status: DC | PRN
  Administered 2013-07-27: 58 mg via INTRAVENOUS

## 2013-07-27 MED ORDER — PHENYLEPHRINE HCL 10 MG/ML IJ SOLN
INTRAMUSCULAR | Status: DC | PRN
  Administered 2013-07-27: 40 ug via INTRAVENOUS
  Administered 2013-07-27 (×2): 80 ug via INTRAVENOUS

## 2013-07-27 MED ORDER — PROMETHAZINE HCL 25 MG/ML IJ SOLN: 6.2500 mg | INTRAMUSCULAR | Status: DC | PRN

## 2013-07-27 MED ORDER — HYDROCODONE-ACETAMINOPHEN 7.5-325 MG PO TABS: 1.0000 | ORAL_TABLET | ORAL | Status: DC | PRN

## 2013-07-27 MED ORDER — PREGABALIN 100 MG PO CAPS
300.0000 mg | ORAL_CAPSULE | Freq: Two times a day (BID) | ORAL | Status: DC
  Administered 2013-07-27 – 2013-07-28 (×2): 300 mg via ORAL
  Filled 2013-07-27 (×2): qty 3

## 2013-07-27 MED ORDER — ACETAMINOPHEN 325 MG PO TABS
650.0000 mg | ORAL_TABLET | Freq: Four times a day (QID) | ORAL | Status: DC | PRN
  Administered 2013-07-28: 650 mg via ORAL
  Filled 2013-07-27: qty 2

## 2013-07-27 MED ORDER — FENTANYL CITRATE 0.05 MG/ML IJ SOLN
INTRAMUSCULAR | Status: DC | PRN
  Administered 2013-07-27: 50 ug via INTRAVENOUS
  Administered 2013-07-27: 100 ug via INTRAVENOUS
  Administered 2013-07-27 (×2): 50 ug via INTRAVENOUS

## 2013-07-27 MED ORDER — MIDAZOLAM HCL 2 MG/2ML IJ SOLN
INTRAMUSCULAR | Status: AC
  Filled 2013-07-27: qty 2

## 2013-07-27 MED ORDER — FAMOTIDINE 10 MG PO CHEW: 10.0000 mg | CHEWABLE_TABLET | Freq: Two times a day (BID) | ORAL | Status: DC

## 2013-07-27 MED ORDER — CEFAZOLIN SODIUM-DEXTROSE 2-3 GM-% IV SOLR
2.0000 g | Freq: Four times a day (QID) | INTRAVENOUS | Status: AC
  Administered 2013-07-27 – 2013-07-28 (×3): 2 g via INTRAVENOUS
  Filled 2013-07-27 (×3): qty 50

## 2013-07-27 MED ORDER — ONDANSETRON HCL 4 MG/2ML IJ SOLN
INTRAMUSCULAR | Status: AC
  Filled 2013-07-27: qty 2

## 2013-07-27 MED ORDER — SODIUM CHLORIDE 0.9 % IR SOLN
Status: DC | PRN
  Administered 2013-07-27: 13:00:00

## 2013-07-27 MED ORDER — NEOSTIGMINE METHYLSULFATE 1 MG/ML IJ SOLN
INTRAMUSCULAR | Status: DC | PRN
  Administered 2013-07-27: 4.5 mg via INTRAVENOUS

## 2013-07-27 MED ORDER — MIDAZOLAM HCL 5 MG/5ML IJ SOLN
INTRAMUSCULAR | Status: DC | PRN
  Administered 2013-07-27: 2 mg via INTRAVENOUS

## 2013-07-27 MED ORDER — FENTANYL CITRATE 0.05 MG/ML IJ SOLN
INTRAMUSCULAR | Status: AC
  Filled 2013-07-27: qty 5

## 2013-07-27 MED ORDER — ONDANSETRON HCL 4 MG PO TABS: 4.0000 mg | ORAL_TABLET | Freq: Four times a day (QID) | ORAL | Status: DC | PRN

## 2013-07-27 MED ORDER — ROCURONIUM BROMIDE 100 MG/10ML IV SOLN
INTRAVENOUS | Status: DC | PRN
  Administered 2013-07-27: 50 mg via INTRAVENOUS
  Administered 2013-07-27: 5 mg via INTRAVENOUS

## 2013-07-27 MED ORDER — METOCLOPRAMIDE HCL 5 MG/ML IJ SOLN
INTRAMUSCULAR | Status: DC | PRN
  Administered 2013-07-27: 10 mg via INTRAVENOUS

## 2013-07-27 MED ORDER — GLYCOPYRROLATE 0.2 MG/ML IJ SOLN
INTRAMUSCULAR | Status: DC | PRN
  Administered 2013-07-27: .5 mg via INTRAVENOUS

## 2013-07-27 MED ORDER — FAMOTIDINE 10 MG PO TABS
10.0000 mg | ORAL_TABLET | Freq: Two times a day (BID) | ORAL | Status: DC
  Administered 2013-07-28: 10 mg via ORAL
  Filled 2013-07-27 (×3): qty 1

## 2013-07-27 MED ORDER — CEFAZOLIN SODIUM-DEXTROSE 2-3 GM-% IV SOLR
2.0000 g | INTRAVENOUS | Status: AC
  Administered 2013-07-27: 2 g via INTRAVENOUS

## 2013-07-27 MED ORDER — ACETAMINOPHEN 650 MG RE SUPP: 650.0000 mg | Freq: Four times a day (QID) | RECTAL | Status: DC | PRN

## 2013-07-27 MED ORDER — MORPHINE SULFATE 2 MG/ML IJ SOLN
INTRAMUSCULAR | Status: AC
Start: 2013-07-27 — End: 2013-07-28
  Filled 2013-07-27: qty 1

## 2013-07-27 MED ORDER — ROCURONIUM BROMIDE 100 MG/10ML IV SOLN
INTRAVENOUS | Status: AC
  Filled 2013-07-27: qty 1

## 2013-07-27 MED ORDER — EPHEDRINE SULFATE 50 MG/ML IJ SOLN
INTRAMUSCULAR | Status: DC | PRN
  Administered 2013-07-27 (×2): 5 mg via INTRAVENOUS
  Administered 2013-07-27: 7.5 mg via INTRAVENOUS
  Administered 2013-07-27: 5 mg via INTRAVENOUS

## 2013-07-27 MED ORDER — METFORMIN HCL 500 MG PO TABS: 1000.0000 mg | ORAL_TABLET | Freq: Two times a day (BID) | ORAL | Status: DC

## 2013-07-27 MED ORDER — CYCLOBENZAPRINE HCL 10 MG PO TABS: 10.0000 mg | ORAL_TABLET | Freq: Three times a day (TID) | ORAL | Status: DC | PRN

## 2013-07-27 MED ORDER — LIDOCAINE HCL (CARDIAC) 20 MG/ML IV SOLN
INTRAVENOUS | Status: AC
  Filled 2013-07-27: qty 5

## 2013-07-27 MED ORDER — CEFAZOLIN SODIUM-DEXTROSE 2-3 GM-% IV SOLR
INTRAVENOUS | Status: AC
  Filled 2013-07-27: qty 50

## 2013-07-27 MED ORDER — OXYCODONE-ACETAMINOPHEN 5-325 MG PO TABS: 1.0000 | ORAL_TABLET | ORAL | Status: DC | PRN

## 2013-07-27 MED ORDER — GLYCOPYRROLATE 0.2 MG/ML IJ SOLN
INTRAMUSCULAR | Status: AC
  Filled 2013-07-27: qty 3

## 2013-07-27 MED ORDER — SODIUM CHLORIDE 0.45 % IV SOLN
INTRAVENOUS | Status: DC
  Administered 2013-07-27: 18:00:00 via INTRAVENOUS

## 2013-07-27 MED ORDER — FENTANYL CITRATE 0.05 MG/ML IJ SOLN
INTRAMUSCULAR | Status: AC
  Filled 2013-07-27: qty 2

## 2013-07-27 MED ORDER — NEOSTIGMINE METHYLSULFATE 1 MG/ML IJ SOLN
INTRAMUSCULAR | Status: AC
  Filled 2013-07-27: qty 10

## 2013-07-27 MED ORDER — DOCUSATE SODIUM 100 MG PO CAPS
100.0000 mg | ORAL_CAPSULE | Freq: Two times a day (BID) | ORAL | Status: DC
  Administered 2013-07-27 – 2013-07-28 (×2): 100 mg via ORAL

## 2013-07-27 MED ORDER — ALPRAZOLAM 1 MG PO TABS: 1.0000 mg | ORAL_TABLET | Freq: Two times a day (BID) | ORAL | Status: DC | PRN

## 2013-07-27 MED ORDER — CYCLOBENZAPRINE HCL 10 MG PO TABS
10.0000 mg | ORAL_TABLET | Freq: Three times a day (TID) | ORAL | Status: DC | PRN
  Administered 2013-07-27 – 2013-07-28 (×3): 10 mg via ORAL
  Filled 2013-07-27 (×3): qty 1

## 2013-07-27 MED ORDER — MENTHOL 3 MG MT LOZG: 1.0000 | LOZENGE | OROMUCOSAL | Status: DC | PRN

## 2013-07-27 MED ORDER — DIPHENHYDRAMINE HCL 12.5 MG/5ML PO ELIX: 12.5000 mg | ORAL_SOLUTION | Freq: Four times a day (QID) | ORAL | Status: DC | PRN

## 2013-07-27 MED ORDER — PROPOFOL 10 MG/ML IV BOLUS
INTRAVENOUS | Status: AC
  Filled 2013-07-27: qty 20

## 2013-07-27 MED ORDER — INSULIN ASPART PROT & ASPART (70-30 MIX) 100 UNIT/ML ~~LOC~~ SUSP
50.0000 [IU] | Freq: Two times a day (BID) | SUBCUTANEOUS | Status: DC
  Administered 2013-07-27 – 2013-07-28 (×2): 50 [IU] via SUBCUTANEOUS
  Filled 2013-07-27: qty 10

## 2013-07-27 MED ORDER — ONDANSETRON HCL 4 MG/2ML IJ SOLN: 4.0000 mg | Freq: Four times a day (QID) | INTRAMUSCULAR | Status: DC | PRN

## 2013-07-27 MED ORDER — PHENYLEPHRINE 40 MCG/ML (10ML) SYRINGE FOR IV PUSH (FOR BLOOD PRESSURE SUPPORT)
PREFILLED_SYRINGE | INTRAVENOUS | Status: AC
  Filled 2013-07-27: qty 10

## 2013-07-27 MED ORDER — BUPIVACAINE-EPINEPHRINE PF 0.5-1:200000 % IJ SOLN
INTRAMUSCULAR | Status: AC
  Filled 2013-07-27: qty 30

## 2013-07-27 MED ORDER — DEXAMETHASONE SODIUM PHOSPHATE 10 MG/ML IJ SOLN
INTRAMUSCULAR | Status: DC | PRN
  Administered 2013-07-27: 5 mg via INTRAVENOUS

## 2013-07-27 SURGICAL SUPPLY — 46 items
ANCH SUT 2 2.9 2 LD TPR NDL (Anchor) ×1 IMPLANT
ANCHOR JUGGERKNOT WTAP NDL 2.9 (Anchor) ×1 IMPLANT
ANCHOR NDL 9/16 CIR SZ 8 (NEEDLE) IMPLANT
ANCHOR NEEDLE 9/16 CIR SZ 8 (NEEDLE) ×2 IMPLANT
BAG SPEC THK2 15X12 ZIP CLS (MISCELLANEOUS)
BAG ZIPLOCK 12X15 (MISCELLANEOUS) IMPLANT
BIT DRILL JUGRKNT W/NDL BIT2.9 (DRILL) IMPLANT
CLEANER TIP ELECTROSURG 2X2 (MISCELLANEOUS) ×2 IMPLANT
CLOTH 2% CHLOROHEXIDINE 3PK (PERSONAL CARE ITEMS) ×2 IMPLANT
DECANTER SPIKE VIAL GLASS SM (MISCELLANEOUS) IMPLANT
DRAPE ORTHO SPLIT 77X108 STRL (DRAPES) ×2
DRAPE POUCH INSTRU U-SHP 10X18 (DRAPES) ×2 IMPLANT
DRAPE SURG ORHT 6 SPLT 77X108 (DRAPES) ×1 IMPLANT
DRILL JUGGERKNOT W/NDL BIT 2.9 (DRILL) ×2
DRSG AQUACEL AG ADV 3.5X 4 (GAUZE/BANDAGES/DRESSINGS) ×1 IMPLANT
DRSG AQUACEL AG ADV 3.5X 6 (GAUZE/BANDAGES/DRESSINGS) IMPLANT
DURAPREP 26ML APPLICATOR (WOUND CARE) ×2 IMPLANT
ELECT NDL TIP 2.8 STRL (NEEDLE) ×1 IMPLANT
ELECT NEEDLE TIP 2.8 STRL (NEEDLE) ×2 IMPLANT
ELECT REM PT RETURN 9FT ADLT (ELECTROSURGICAL) ×2
ELECTRODE REM PT RTRN 9FT ADLT (ELECTROSURGICAL) ×1 IMPLANT
GLOVE BIOGEL PI IND STRL 7.5 (GLOVE) ×1 IMPLANT
GLOVE BIOGEL PI IND STRL 8 (GLOVE) IMPLANT
GLOVE BIOGEL PI INDICATOR 7.5 (GLOVE) ×1
GLOVE BIOGEL PI INDICATOR 8 (GLOVE)
GLOVE SURG SS PI 7.5 STRL IVOR (GLOVE) ×2 IMPLANT
GLOVE SURG SS PI 8.0 STRL IVOR (GLOVE) ×4 IMPLANT
GOWN STRL REUS W/TWL XL LVL3 (GOWN DISPOSABLE) ×4 IMPLANT
KIT BASIN OR (CUSTOM PROCEDURE TRAY) ×2 IMPLANT
KIT POSITION SHOULDER SCHLEI (MISCELLANEOUS) ×2 IMPLANT
MANIFOLD NEPTUNE II (INSTRUMENTS) ×2 IMPLANT
NDL SCORPION MULTI FIRE (NEEDLE) IMPLANT
NEEDLE SCORPION MULTI FIRE (NEEDLE) IMPLANT
PACK SHOULDER CUSTOM OPM052 (CUSTOM PROCEDURE TRAY) ×2 IMPLANT
POSITIONER SURGICAL ARM (MISCELLANEOUS) ×2 IMPLANT
SLING ARM IMMOBILIZER LRG (SOFTGOODS) IMPLANT
SLING ULTRA II L (ORTHOPEDIC SUPPLIES) IMPLANT
STRIP CLOSURE SKIN 1/2X4 (GAUZE/BANDAGES/DRESSINGS) ×2 IMPLANT
SUT BONE WAX W31G (SUTURE) IMPLANT
SUT ETHIBOND NAB CT1 #1 30IN (SUTURE) IMPLANT
SUT PROLENE 3 0 PS 2 (SUTURE) ×2 IMPLANT
SUT TIGER TAPE 7 IN WHITE (SUTURE) IMPLANT
SUT VIC AB 1-0 CT2 27 (SUTURE) ×2 IMPLANT
SUT VIC AB 2-0 CT2 27 (SUTURE) ×2 IMPLANT
SUT VICRYL 0 UR6 27IN ABS (SUTURE) ×1 IMPLANT
TAPE FIBER 2MM 7IN #2 BLUE (SUTURE) IMPLANT

## 2013-07-27 NOTE — Discharge Instructions (Signed)
Aquacel dressing may remain in place x 7 days. May shower with aquacel in place. After 7 days, remove aquacel, keep steri strips in place. Cover with a gauze and tape dressing which should be kept clean and dry and changed daily. Use sling at times except when exercising or showering No driving for 4-6 weeks No lifting for 6 weeks operative arm Pendulum exercises as instructed. Ok to move wrist,elbow, and hand. See Dr. Tonita Cong in 10-14 days. Take one aspirin per day with a meal if not on a blood thinner or allergic to aspirin.

## 2013-07-27 NOTE — Interval H&P Note (Signed)
History and Physical Interval Note:  07/27/2013 7:22 AM  [name]  has presented today for surgery, with the diagnosis of left rotator cuff tear  The various methods of treatment have been discussed with the patient and family. After consideration of risks, benefits and other options for treatment, the patient has consented to  Procedure(s): LEFT MINI OPEN ROTATOR CUFF REPAIR  (Left) as a surgical intervention .  The patient's history has been reviewed, patient examined, no change in status, stable for surgery.  I have reviewed the patient's chart and labs.  Questions were answered to the patient's satisfaction.     [name]

## 2013-07-27 NOTE — H&P (View-Only) (Signed)
Reason for Consult:Left Shoulder pain Referring Physician: EDOP  [name] is an 48 y.o. female.  HPI: Increasing pain with hx partial tear RC severe  Past Medical History  Diagnosis Date  . Diabetes mellitus   . Hypertension   . High cholesterol   . Clostridium difficile colitis   . Sea-blue histiocyte syndrome   . Leukocytosis     Dr Neijstrom  . Fibromyalgia   . Neuropathy, peripheral     Past Surgical History  Procedure Laterality Date  . Splenectomy    . Tonsillectomy    . Abdominal hysterectomy      partial  . Esophagogastroduodenoscopy  08/27/2011    Rourk-reactive gastropathy, normal, patent esophagus. Stomach empty. Multiple linear antral erosions. No ulcer or infiltrating process. Small hiatal hernia. Patent pylorus. Normal first and second portion of the duodenum  . Colonoscopy  12/07/2011    Rourk-normal rectum,, colon and terminal ileum  . Shoulder arthroscopy with open rotator cuff repair Left 03/02/2013    Procedure: LEFT SHOULDER ARTHROSCOPY, SUBACROMIAL DECOMPRESSION, MANIPULATION UNDER ANESTHESIA;  Surgeon: Estefanie Cornforth C Gino Garrabrant, MD;  Location: [facility];  Service: Orthopedics;  Laterality: Left;    Family History  Problem Relation Age of Onset  . Colon cancer Neg Hx   . Arthritis Mother   . Diabetes Father   . Kidney disease Father     Social History:  reports that she quit smoking about 7 years ago. Her smoking use included Cigarettes. She has a 30 pack-year smoking history. She has never used smokeless tobacco. She reports that she does not drink alcohol or use illicit drugs.  Allergies:  Allergies  Allergen Reactions  . Aspartame And Phenylalanine Shortness Of Breath and Swelling    Throat swells  ANY ARTIFICAL SWEETNERS  . Toradol [Ketorolac Tromethamine] Other (See Comments)    Face & neck flushed red & pt felt very hot after IV adm.  . Sucralose Rash    Pt.s face broke out in rash after drinking Breeza.  Pt. Claims it is from the artificial  sweetener.     . Dilaudid [Hydromorphone Hcl]     Itch  . Hydrocodone Itching  . Oxycodone Rash    itching    Medications: I have reviewed the patient's current medications.  No results found for this or any previous visit (from the past 48 hour(s)).  No results found.  Review of Systems  Musculoskeletal: Positive for joint pain.  All other systems reviewed and are negative.   Blood pressure 147/68, pulse 81, temperature 97.6 F (36.4 C), temperature source Oral, resp. rate 18, SpO2 99.00%. Physical Exam  Constitutional: She is oriented to person, place, and time. She appears well-developed.  HENT:  Head: Normocephalic.  Eyes: Pupils are equal, round, and reactive to light.  Neck: Normal range of motion.  Cardiovascular: Normal rate.   Respiratory: Effort normal.  GI: Soft.  Musculoskeletal: She exhibits tenderness. She exhibits no edema.  +impingement left shoulder. Weak Er. NVI. No DVT. No Spurling  Neurological: She is alert and oriented to person, place, and time.  Skin: Skin is warm and dry.  Psychiatric: She has a normal mood and affect.    Assessment/Plan: Progressive tearing of partial tear RC nonhealing Hx DM Plan RCR SAD Risks discussed. Pain medicine at home Ok See Dr. Fremon Zacharia this week OK to D/C. Sling Pendulum exercises.  [name] C 07/23/2013, 12:27 PM      

## 2013-07-27 NOTE — Anesthesia Preprocedure Evaluation (Signed)
Anesthesia Evaluation  Patient identified by MRN, date of birth, ID band Patient awake    Reviewed: Allergy & Precautions, H&P , NPO status , Patient's Chart, lab work & pertinent test results  Airway Mallampati: II TM Distance: >3 FB Neck ROM: full    Dental no notable dental hx. (+) Teeth Intact, Dental Advisory Given   Pulmonary neg pulmonary ROS, former smoker,  breath sounds clear to auscultation  Pulmonary exam normal       Cardiovascular Exercise Tolerance: Good hypertension, Pt. on medications Rhythm:regular Rate:Normal     Neuro/Psych negative neurological ROS  negative psych ROS   GI/Hepatic Neg liver ROS, hiatal hernia, GERD-  Medicated and Controlled,  Endo/Other  diabetes, Well Controlled, Type 2, Oral Hypoglycemic Agents  Renal/GU negative Renal ROS  negative genitourinary   Musculoskeletal   Abdominal   Peds  Hematology negative hematology ROS (+)   Anesthesia Other Findings   Reproductive/Obstetrics negative OB ROS                           Anesthesia Physical  Anesthesia Plan  ASA: III  Anesthesia Plan: General   Post-op Pain Management:    Induction: Intravenous  Airway Management Planned: Oral ETT  Additional Equipment:   Intra-op Plan:   Post-operative Plan: Extubation in OR  Informed Consent: I have reviewed the patients History and Physical, chart, labs and discussed the procedure including the risks, benefits and alternatives for the proposed anesthesia with the patient or authorized representative who has indicated his/her understanding and acceptance.   Dental Advisory Given  Plan Discussed with: CRNA and Surgeon  Anesthesia Plan Comments:         Anesthesia Quick Evaluation

## 2013-07-27 NOTE — Transfer of Care (Signed)
Immediate Anesthesia Transfer of Care Note  Patient: [name]  Procedure(s) Performed: Procedure(s): LEFT MINI OPEN ROTATOR CUFF REPAIR  (Left)  Patient Location: PACU  Anesthesia Type:General  Level of Consciousness: awake, alert  and oriented  Airway & Oxygen Therapy: Patient Spontanous Breathing and Patient connected to face mask oxygen  Post-op Assessment: Report given to PACU RN  Post vital signs: Reviewed and stable  Complications: No apparent anesthesia complications

## 2013-07-27 NOTE — Brief Op Note (Signed)
07/27/2013  1:18 PM  PATIENT:  [name]  48 y.o. female  PRE-OPERATIVE DIAGNOSIS:  left rotator cuff tear  POST-OPERATIVE DIAGNOSIS:  left rotator cuff tear  PROCEDURE:  Procedure(s): LEFT MINI OPEN ROTATOR CUFF REPAIR  (Left)  SURGEON:  Surgeon(s) and Role:    * [name], MD - Primary  PHYSICIAN ASSISTANT:   ASSISTANTS: Bissell   ANESTHESIA:   general  EBL:     BLOOD ADMINISTERED:none  DRAINS: none   LOCAL MEDICATIONS USED:  MARCAINE     SPECIMEN:  No Specimen  DISPOSITION OF SPECIMEN:  N/A  COUNTS:  YES  TOURNIQUET:  * No tourniquets in log *  DICTATION: .Other Dictation: Dictation Number [id number]  PLAN OF CARE: Admit for overnight observation  PATIENT DISPOSITION:  PACU - hemodynamically stable.   Delay start of Pharmacological VTE agent (>24hrs) due to surgical blood loss or risk of bleeding: no

## 2013-07-27 NOTE — Anesthesia Postprocedure Evaluation (Signed)
Anesthesia Post Note  Patient: [name]  Procedure(s) Performed: Procedure(s) (LRB): LEFT MINI OPEN ROTATOR CUFF REPAIR  (Left)  Anesthesia type: General  Patient location: PACU  Post pain: Pain level controlled  Post assessment: Post-op Vital signs reviewed  Last Vitals: BP 130/76  Pulse 71  Temp(Src) 37 C (Oral)  Resp 16  SpO2 99%  Post vital signs: Reviewed  Level of consciousness: sedated  Complications: No apparent anesthesia complications

## 2013-07-27 NOTE — H&P (View-Only) (Signed)
[name] is an 48 y.o. female.   Chief Complaint:  L shoulder pain HPI: 21 weeks s/p L SA/SAD/EUA/MUA with debridement of interstitial RCT, ongoing post-op pain, restricted motion, adhesive capsulitis, refractory to PT, HEP, pain medications, NSAIDs, steroid and lidocaine injections, relative rest, activity modifications. She is diabetic. Seen by Dr. Tonita Cong in [facility] ER this past weekend due to increased pain, due to her ongoing symptoms, they mutually decided to proceed with repair.  Past Medical History  Diagnosis Date  . Diabetes mellitus   . Hypertension   . High cholesterol   . Clostridium difficile colitis   . Sea-blue histiocyte syndrome   . Leukocytosis     Dr Tressie Stalker  . Fibromyalgia   . Neuropathy, peripheral   . Upper respiratory infection 2/15    states has dry non productive cough since then- "tickle in my throat" no fever  . Anxiety   . GERD (gastroesophageal reflux disease)   . H/O hiatal hernia     Past Surgical History  Procedure Laterality Date  . Splenectomy    . Tonsillectomy    . Abdominal hysterectomy      partial  . Esophagogastroduodenoscopy  08/27/2011    Rourk-reactive gastropathy, normal, patent esophagus. Stomach empty. Multiple linear antral erosions. No ulcer or infiltrating process. Small hiatal hernia. Patent pylorus. Normal first and second portion of the duodenum  . Colonoscopy  12/07/2011    Rourk-normal rectum,, colon and terminal ileum  . Shoulder arthroscopy with open rotator cuff repair Left 03/02/2013    Procedure: LEFT SHOULDER ARTHROSCOPY, SUBACROMIAL DECOMPRESSION, MANIPULATION UNDER ANESTHESIA;  Surgeon: [name], MD;  Location: [facility];  Service: Orthopedics;  Laterality: Left;    Family History  Problem Relation Age of Onset  . Colon cancer Neg Hx   . Arthritis Mother   . Diabetes Father   . Kidney disease Father    Social History:  reports that she quit smoking about 7 years ago. Her smoking use included Cigarettes.  She has a 30 pack-year smoking history. She has never used smokeless tobacco. She reports that she does not drink alcohol or use illicit drugs.  Allergies:  Allergies  Allergen Reactions  . Aspartame And Phenylalanine Shortness Of Breath and Swelling    Throat swells  ANY ARTIFICAL SWEETNERS  . Toradol [Ketorolac Tromethamine] Other (See Comments)    Face & neck flushed red & pt felt very hot after IV adm.  . Sucralose Rash    Pt.s face broke out in rash after drinking Breeza.  Pt. Claims it is from the artificial sweetener.     . Dilaudid [Hydromorphone Hcl]     Itch  . Hydrocodone Itching  . Oxycodone Rash    itching     (Not in a hospital admission)  Results for orders placed during the hospital encounter of 07/25/13 (from the past 48 hour(s))  BASIC METABOLIC PANEL     Status: Abnormal   Collection Time    07/25/13  9:25 AM      Result Value Ref Range   Sodium 137  137 - 147 mEq/L   Potassium 4.3  3.7 - 5.3 mEq/L   Chloride 97  96 - 112 mEq/L   CO2 26  19 - 32 mEq/L   Glucose, Bld 279 (*) 70 - 99 mg/dL   BUN 10  6 - 23 mg/dL   Creatinine, Ser 0.55  0.50 - 1.10 mg/dL   Calcium 9.7  8.4 - 10.5 mg/dL   GFR  calc non Af Amer >90  >90 mL/min   GFR calc Af Amer >90  >90 mL/min   Comment: (NOTE)     The eGFR has been calculated using the CKD EPI equation.     This calculation has not been validated in all clinical situations.     eGFR's persistently <90 mL/min signify possible Chronic Kidney     Disease.  CBC     Status: Abnormal   Collection Time    07/25/13  9:25 AM      Result Value Ref Range   WBC 14.2 (*) 4.0 - 10.5 K/uL   RBC 4.83  3.87 - 5.11 MIL/uL   Hemoglobin 15.5 (*) 12.0 - 15.0 g/dL   HCT 44.7  36.0 - 46.0 %   MCV 92.5  78.0 - 100.0 fL   MCH 32.1  26.0 - 34.0 pg   MCHC 34.7  30.0 - 36.0 g/dL   RDW 16.3 (*) 11.5 - 15.5 %   Platelets 399  150 - 400 K/uL   No results found.  Review of Systems  Constitutional: Negative.   HENT: Negative.   Eyes:  Negative.   Respiratory: Negative.   Cardiovascular: Negative.   Gastrointestinal: Negative.   Genitourinary: Negative.   Musculoskeletal: Positive for joint pain.  Skin: Negative.   Neurological: Negative.   Psychiatric/Behavioral: Negative.     There were no vitals taken for this visit. Physical Exam  Constitutional: She is oriented to person, place, and time. She appears well-developed and well-nourished.  HENT:  Head: Normocephalic and atraumatic.  Eyes: Conjunctivae and EOM are normal. Pupils are equal, round, and reactive to light.  Neck: Normal range of motion. Neck supple.  Cardiovascular: Normal rate and regular rhythm.   Respiratory: Effort normal and breath sounds normal.  GI: Soft. Bowel sounds are normal.  Musculoskeletal:  Decreased ROM + impingement Weakness in external rotation   Neurological: She is alert and oriented to person, place, and time. She has normal reflexes.  Skin: Skin is warm and dry.  Psychiatric: She has a normal mood and affect.     Assessment/Plan Shoulder pain status post subacromial decompression and debridement interstitial cuff tear with recurrent pain, recurrent adhesive capsulitis despite rest, activity modifications, activity modifications, and corticosteroid injections.  At this point plan to proceed with L shoulder mini-open RCR, SAD. Dr. Tonita Cong discussed risks, complications, alternatives including but not limited to DVT, PE, infx, bleeding, failure of procedure, need for secondary procedure, anesthesia risk, even death.  Will redraw fasting glucose AM of surgery  Plan L shoulder mini-open RCR, SAD   Kadynce Bonds M. Enrigue Hashimi PA-C for Dr. Tonita Cong 07/26/2013, 9:27 PM

## 2013-07-27 NOTE — Interval H&P Note (Signed)
History and Physical Interval Note:  07/27/2013 7:23 AM  [name]  has presented today for surgery, with the diagnosis of left rotator cuff tear  The various methods of treatment have been discussed with the patient and family. After consideration of risks, benefits and other options for treatment, the patient has consented to  Procedure(s): LEFT MINI OPEN ROTATOR CUFF REPAIR  (Left) as a surgical intervention .  The patient's history has been reviewed, patient examined, no change in status, stable for surgery.  I have reviewed the patient's chart and labs.  Questions were answered to the patient's satisfaction.     [name]

## 2013-07-28 ENCOUNTER — Encounter (HOSPITAL_COMMUNITY): Payer: Self-pay | Admitting: Specialist

## 2013-07-28 LAB — HEMOGLOBIN A1C
Hgb A1c MFr Bld: 10.2 % — ABNORMAL HIGH (ref <5.7)
Mean Plasma Glucose: 246 mg/dL — ABNORMAL HIGH (ref <117)

## 2013-07-28 LAB — BASIC METABOLIC PANEL
BUN: 10 mg/dL (ref 6–23)
CO2: 25 mEq/L (ref 19–32)
Calcium: 9.2 mg/dL (ref 8.4–10.5)
Chloride: 99 mEq/L (ref 96–112)
Creatinine, Ser: 0.58 mg/dL (ref 0.50–1.10)
GFR calc Af Amer: >90 mL/min (ref >90)
GFR calc non Af Amer: >90 mL/min (ref >90)
Glucose, Bld: 192 mg/dL — ABNORMAL HIGH (ref 70–99)
Potassium: 4.3 mEq/L (ref 3.7–5.3)
Sodium: 136 mEq/L — ABNORMAL LOW (ref 137–147)

## 2013-07-28 LAB — GLUCOSE, CAPILLARY
Glucose-Capillary: 170 mg/dL — ABNORMAL HIGH (ref 70–99)
Glucose-Capillary: 226 mg/dL — ABNORMAL HIGH (ref 70–99)

## 2013-07-28 MED ORDER — TRAMADOL HCL 50 MG PO TABS: 50.0000 mg | ORAL_TABLET | Freq: Four times a day (QID) | ORAL | Status: DC | PRN

## 2013-07-28 MED ORDER — HYDROXYZINE HCL 10 MG PO TABS: 10.0000 mg | ORAL_TABLET | Freq: Three times a day (TID) | ORAL | Status: DC | PRN

## 2013-07-28 NOTE — Evaluation (Signed)
Occupational Therapy Evaluation Patient Details Name: NARYA BEAVIN MRN: [id number] DOB: 1965-12-05 Today's Date: 07/28/2013    History of Present Illness pt was admitted for  L RCR.  she is s/p mini open repair with anchor and SAD   Clinical Impression   Pt was admitted for the above surgery. All education was completed.  Pt will follow up with Dr Tonita Cong for further rehab.    Follow Up Recommendations   (MD will order OP when appropriate)    Equipment Recommendations  None recommended by OT    Recommendations for Other Services       Precautions / Restrictions Precautions Precautions: Shoulder Type of Shoulder Precautions: per Dr Reather Littler protocol Precaution Comments: sling on except bathing and dressing Restrictions Weight Bearing Restrictions: No      Mobility Bed Mobility Overal bed mobility: Modified Independent             General bed mobility comments: hob raised  Transfers Overall transfer level: Independent Equipment used: None                  Balance                                            ADL Overall ADL's : Needs assistance/impaired     Grooming: Wash/dry hands;Standing;Independent   Upper Body Bathing: Moderate assistance;Sitting   Lower Body Bathing: Moderate assistance;Sit to/from stand   Upper Body Dressing : Maximal assistance;Sitting   Lower Body Dressing: Moderate assistance;Sit to/from stand   Toilet Transfer: Copywriter, advertising;Independent   Toileting- Clothing Manipulation and Hygiene: Sit to/from stand;Independent       Functional mobility during ADLs: Independent General ADL Comments: ambulated to bathroom independently.  Mother will help at home.  She had previous RCR surgery in November and re-educated on precautions during adls, sling, and pendulum exercises. All education completed: see pt education section of chart     Vision                     Perception     Praxis       Pertinent Vitals/Pain 8/10 L shoulder--premedicated, repositioned and ice applied     Hand Dominance Right   Extremity/Trunk Assessment Upper Extremity Assessment Upper Extremity Assessment: LUE deficits/detail LUE Deficits / Details: immobilized           Communication Communication Communication: No difficulties   Cognition Arousal/Alertness: Awake/alert Behavior During Therapy: WFL for tasks assessed/performed Overall Cognitive Status: Within Functional Limits for tasks assessed                     General Comments       Exercises       Shoulder Instructions      Home Living Family/patient expects to be discharged to:: Private residence Living Arrangements: Parent                                      Prior Functioning/Environment Level of Independence: Independent             OT Diagnosis:     OT Problem List:     OT Treatment/Interventions:      OT Goals(Current goals can be found in the care plan section)    OT Frequency:  Barriers to D/C:            Co-evaluation              End of Session    Activity Tolerance: Patient tolerated treatment well Patient left: in bed;with call bell/phone within reach   Time: 0937-1012 OT Time Calculation (min): 35 min Charges:  OT General Charges $OT Visit: 1 Procedure OT Evaluation $Initial OT Evaluation Tier I: 1 Procedure OT Treatments $Self Care/Home Management : 8-22 mins $Therapeutic Exercise: 8-22 mins G-Codes: OT G-codes **NOT FOR INPATIENT CLASS** Functional Assessment Tool Used: clinical observation/judgment Functional Limitation: Self care Self Care Current Status (B4496): At least 60 percent but less than 80 percent impaired, limited or restricted Self Care Goal Status (P5916): At least 60 percent but less than 80 percent impaired, limited or restricted Self Care Discharge Status [phone]): At least 60 percent but less than 80 percent impaired, limited  or restricted  [facility] 07/28/2013, 10:35 AM Lesle Chris, OTR/L [phone] 07/28/2013

## 2013-07-28 NOTE — Discharge Summary (Signed)
Physician Discharge Summary   Patient ID: [name] MRN: [id number] DOB/AGE: 49-Mar-1967 49 y.o.  Admit date: 07/27/2013 Discharge date: 07/28/2013  Primary Diagnosis:   left rotator cuff tear  Admission Diagnoses:  Past Medical History  Diagnosis Date  . Diabetes mellitus   . Hypertension   . High cholesterol   . Clostridium difficile colitis   . Sea-blue histiocyte syndrome   . Leukocytosis     Dr Tressie Stalker  . Fibromyalgia   . Neuropathy, peripheral   . Upper respiratory infection 2/15    states has dry non productive cough since then- "tickle in my throat" no fever  . Anxiety   . GERD (gastroesophageal reflux disease)   . H/O hiatal hernia    Discharge Diagnoses:   Principal Problem:   Left rotator cuff tear Active Problems:   Complete rotator cuff tear of left shoulder  Procedure:  Procedure(s) (LRB): LEFT MINI OPEN ROTATOR CUFF REPAIR  (Left)   Consults: None  HPI:  see H&P    Laboratory Data: Hospital Outpatient Visit on 07/25/2013  Component Date Value Ref Range Status  . Sodium 07/25/2013 137  137 - 147 mEq/L Final  . Potassium 07/25/2013 4.3  3.7 - 5.3 mEq/L Final  . Chloride 07/25/2013 97  96 - 112 mEq/L Final  . CO2 07/25/2013 26  19 - 32 mEq/L Final  . Glucose, Bld 07/25/2013 279* 70 - 99 mg/dL Final  . BUN 07/25/2013 10  6 - 23 mg/dL Final  . Creatinine, Ser 07/25/2013 0.55  0.50 - 1.10 mg/dL Final  . Calcium 07/25/2013 9.7  8.4 - 10.5 mg/dL Final  . GFR calc non Af Amer 07/25/2013 >90  >90 mL/min Final  . GFR calc Af Amer 07/25/2013 >90  >90 mL/min Final   Comment: (NOTE)                          The eGFR has been calculated using the CKD EPI equation.                          This calculation has not been validated in all clinical situations.                          eGFR's persistently <90 mL/min signify possible Chronic Kidney                          Disease.  . WBC 07/25/2013 14.2* 4.0 - 10.5 K/uL Final  . RBC 07/25/2013 4.83  3.87  - 5.11 MIL/uL Final  . Hemoglobin 07/25/2013 15.5* 12.0 - 15.0 g/dL Final  . HCT 07/25/2013 44.7  36.0 - 46.0 % Final  . MCV 07/25/2013 92.5  78.0 - 100.0 fL Final  . MCH 07/25/2013 32.1  26.0 - 34.0 pg Final  . MCHC 07/25/2013 34.7  30.0 - 36.0 g/dL Final  . RDW 07/25/2013 16.3* 11.5 - 15.5 % Final  . Platelets 07/25/2013 399  150 - 400 K/uL Final    Recent Labs  07/27/13 1702  HGB 14.8    Recent Labs  07/27/13 1702  WBC 20.6*  RBC 4.55  HCT 41.9  PLT 402*    Recent Labs  07/27/13 1602 07/28/13 0520  NA  --  136*  K  --  4.3  CL  --  99  CO2  --  25  BUN  --  10  CREATININE 0.51 0.58  GLUCOSE  --  192*  CALCIUM  --  9.2   No results found for this basename: LABPT, INR,  in the last 72 hours  X-Rays:No results found.  EKG: Orders placed in visit on 03/24/13  . EKG 12-LEAD     Hospital Course: Patient was admitted to [facility] and taken to the OR and underwent the above state procedure without complications.  Patient tolerated the procedure well and was later transferred to the recovery room and then to the orthopaedic floor for postoperative care.  They were given PO and IV analgesics for pain control following their surgery.  They were given 24 hours of postoperative antibiotics.   PT was consulted postop to assist with PROM, pendulums per RCR protocol. Discharge planning was consulted to help with postop disposition and equipment needs.  Patient had a good night on the evening of surgery and started to get up OOB with therapy on day one. Patient was seen in rounds and was ready to go home on day one.  They were given discharge instructions and dressing directions.  They were instructed on when to follow up in the office with Dr. Tonita Cong.  Discharge Medications: Prior to Admission medications   Medication Sig Start Date End Date Taking? Authorizing Provider  acetaminophen (TYLENOL) 500 MG tablet Take 1,000 mg by mouth every 6 (six) hours as needed for  mild pain or moderate pain.    Yes Historical Provider, MD  ALPRAZolam Duanne Moron) 1 MG tablet Take 1 mg by mouth 2 (two) times daily as needed for anxiety.   Yes Historical Provider, MD  amitriptyline (ELAVIL) 50 MG tablet Take 50 mg by mouth at bedtime.   Yes Historical Provider, MD  famotidine (PEPCID AC) 10 MG chewable tablet Chew 10 mg by mouth 2 (two) times daily.   Yes Historical Provider, MD  insulin NPH-regular (HUMULIN 70/30) (70-30) 100 UNIT/ML injection Inject 50 Units into the skin 2 (two) times daily with a meal.    Yes Historical Provider, MD  metFORMIN (GLUCOPHAGE) 1000 MG tablet Take 1,000 mg by mouth 2 (two) times daily with a meal.   Yes Historical Provider, MD  pravastatin (PRAVACHOL) 40 MG tablet Take 40 mg by mouth every morning.   Yes Historical Provider, MD  pregabalin (LYRICA) 300 MG capsule Take 300 mg by mouth 2 (two) times daily.   Yes Historical Provider, MD  cyclobenzaprine (FLEXERIL) 10 MG tablet Take 1 tablet (10 mg total) by mouth 3 (three) times daily as needed for muscle spasms. 07/27/13   [name], MD  HYDROcodone-acetaminophen (NORCO) 7.5-325 MG per tablet Take 1-2 tablets by mouth every 4 (four) hours as needed for moderate pain. 07/27/13   [name], MD  hydrOXYzine (ATARAX/VISTARIL) 10 MG tablet Take 1 tablet (10 mg total) by mouth 3 (three) times daily as needed for itching. 07/28/13   Conley Rolls. Bissell, PA-C  ranitidine (ZANTAC) 150 MG tablet Take 150 mg by mouth 2 (two) times daily. Alternate with pepcid    Historical Provider, MD    Diet: Diabetic diet Activity:NWB LUE; sling Follow-up:in 10-14 days Disposition - Home Discharged Condition: good   Discharge Orders   Future Appointments Provider Department Dept Phone   08/21/2013 11:30 AM Alycia Rossetti, MD [place] [phone]   Future Orders Complete By Expires   Call MD / Call 911  As directed    Constipation Prevention  As directed  Diet - low sodium heart healthy  As  directed    Increase activity slowly as tolerated  As directed        Medication List         acetaminophen 500 MG tablet  Commonly known as:  TYLENOL  Take 1,000 mg by mouth every 6 (six) hours as needed for mild pain or moderate pain.     ALPRAZolam 1 MG tablet  Commonly known as:  XANAX  Take 1 mg by mouth 2 (two) times daily as needed for anxiety.     amitriptyline 50 MG tablet  Commonly known as:  ELAVIL  Take 50 mg by mouth at bedtime.     cyclobenzaprine 10 MG tablet  Commonly known as:  FLEXERIL  Take 1 tablet (10 mg total) by mouth 3 (three) times daily as needed for muscle spasms.     famotidine 10 MG chewable tablet  Commonly known as:  PEPCID AC  Chew 10 mg by mouth 2 (two) times daily.     HUMULIN 70/30 (70-30) 100 UNIT/ML injection  Generic drug:  insulin NPH-regular Human  Inject 50 Units into the skin 2 (two) times daily with a meal.     HYDROcodone-acetaminophen 7.5-325 MG per tablet  Commonly known as:  NORCO  Take 1-2 tablets by mouth every 4 (four) hours as needed for moderate pain.     hydrOXYzine 10 MG tablet  Commonly known as:  ATARAX/VISTARIL  Take 1 tablet (10 mg total) by mouth 3 (three) times daily as needed for itching.     metFORMIN 1000 MG tablet  Commonly known as:  GLUCOPHAGE  Take 1,000 mg by mouth 2 (two) times daily with a meal.     pravastatin 40 MG tablet  Commonly known as:  PRAVACHOL  Take 40 mg by mouth every morning.     pregabalin 300 MG capsule  Commonly known as:  LYRICA  Take 300 mg by mouth 2 (two) times daily.     ranitidine 150 MG tablet  Commonly known as:  ZANTAC  Take 150 mg by mouth 2 (two) times daily. Alternate with pepcid           Follow-up Information   Follow up with BEANE,JEFFREY C, MD In 2 weeks. (For suture removal)    Specialty:  Orthopedic Surgery   Contact information:   [address] [place] [address] [phone]       Signed: Conley Rolls. Bissell 07/28/2013,  1:13 PM

## 2013-07-28 NOTE — Op Note (Signed)
[name], [name]               ACCOUNT NO.:  [id number]  MEDICAL RECORD NO.:  [id number]  LOCATION:  0814                         FACILITY:  [facility]  PHYSICIAN:  Susa Day, M.D.    DATE OF BIRTH:  03/26/1966  DATE OF PROCEDURE:  07/27/2013 DATE OF DISCHARGE:                              OPERATIVE REPORT   PREOPERATIVE DIAGNOSIS:  Rotator cuff tear, left shoulder.  POSTOPERATIVE DIAGNOSIS:  Rotator cuff tear, left shoulder.  PROCEDURE PERFORMED:  Mini open rotator cuff repair, subacromial decompression with utilization of a JuggerKnot suture anchor.  ANESTHESIA:  General.  ASSISTANT:  Cleophas Dunker, PA.  HISTORY:  This is a pleasant 48 year old female, who has had a history of arthroscopic subacromial decompression for partial tear of the rotator cuff.  The patient initially did well satisfactorily.  Had progressive return of shoulder pain.  Repeat MRI indicating a partial tear of the rotator cuff.  It was found on consultation that this was a possible pain generator and then excision of the devitalized and torn cuff and repair would eliminate her pain source, also manipulation and subacromial decompression.  She had a history of fibromyalgia and diabetes as well.  We discussed risks and benefits including bleeding, infection, damage to the neurovascular structures, DVT, PE, anesthetic complications, no change in symptoms, worsening symptoms et Ronney Asters.  TECHNIQUE:  The patient in supine beach-chair position, after the induction of adequate general anesthesia, 2 g Kefzol, left shoulder and upper extremity was prepped and draped in the usual sterile fashion. She had essentially full range of motion of the shoulder.  Surgical marker was utilized to delineate the acromion, AC joint, and coracoid. This small incision 2 cm over the anterolateral aspect of the acromion was performed through the skin only.  Subcutaneous tissue was dissected. Electrocautery was utilized to  achieve hemostasis.  The raphe between the anterior and lateral heads were identified and divided in line with skin incision.  Self-retaining retractor was placed.  Hypertrophic synovitis and bursa was noted and this was excised.  I did digitally lysed multiple adhesions in the subdeltoid region.  We copiously irrigated the wound.  Preoperatively, the tear was noted to be a 1 cm lateral to the bicipital groove and insertion of the supraspinatus tendon.  We palpated that area.  This was attenuated somewhat hypovascular.  I incised this area and debrided approximately a 1 cm in the length and devitalized.  Tear was seen in the intrasubstance.  This was debrided.  I decorticated a portion of the insertion at the greater tuberosity.  I then placed a JuggerKnot suture Anchor in there.  After drilling and insertion of the suture anchor, excellent resistance to pull out, and  repaired side-to-side with a good surgical knot.  Then oversewn with #0 Vicryl interrupted figure-of-eight suture.  There was good bleeding tissue noted.  No tear was noted with extensive probing of the remainder of the cuff.  Good flexion and extension of the elbow.  No instability was noted.  We copiously irrigated the wound.  No active bleeding.  We repaired the deltoid raphe plate with 1 Vicryl, subcu with 2-0, skin, Prolene, sterile dressing applied, placed in a sling, extubated without  difficulty, and transported to the recovery room in satisfactory condition.  The patient tolerated the procedure well.  No complications.  Assistant, Cleophas Dunker, [place], was used for retraction in the upper extremity, helped in closure, and positioning the patient.     Susa Day, M.D.     Geralynn Rile  D:  07/27/2013  T:  07/27/2013  Job:  [id number]

## 2013-07-28 NOTE — Progress Notes (Signed)
OT Note:  Pendulums were performed during OT.  No PT is needed in acute.  [place], [place] [phone] 07/28/2013

## 2013-07-28 NOTE — Progress Notes (Signed)
Subjective: 1 Day Post-Op Procedure(s) (LRB): LEFT MINI OPEN ROTATOR CUFF REPAIR  (Left) Patient reports pain as moderate.  Pt hesistant to take narcotics due to itching at home with oxycodone and norco. She is tolerating morphine well. She reports the itching was not previously relieved at home with benadryl. She is allergic to toradol. Received ultram last night with some relief.  Pt reports pain at the L shoulder, denies numbness, tingling, CP, SOB, N/V.   Objective: Vital signs in last 24 hours: Temp:  [97.5 F (36.4 C)-98.8 F (37.1 C)] 97.7 F (36.5 C) (04/10 0552) Pulse Rate:  [55-104] 55 (04/10 0552) Resp:  [15-18] 16 (04/10 0552) BP: (102-146)/(56-76) 102/69 mmHg (04/10 0552) SpO2:  [94 %-100 %] 97 % (04/10 0552) Weight:  [87.544 kg (193 lb)] 87.544 kg (193 lb) (04/09 1500)  Intake/Output from previous day: 04/09 0701 - 04/10 0700 In: 3030 [P.O.:600; I.V.:2280; IV Piggyback:150] Out: 2120 [Urine:2100; Blood:20] Intake/Output this shift:     Recent Labs  07/25/13 0925 07/27/13 1702  HGB 15.5* 14.8    Recent Labs  07/25/13 0925 07/27/13 1702  WBC 14.2* 20.6*  RBC 4.83 4.55  HCT 44.7 41.9  PLT 399 402*    Recent Labs  07/25/13 0925 07/27/13 1602 07/28/13 0520  NA 137  --  136*  K 4.3  --  4.3  CL 97  --  99  CO2 26  --  25  BUN 10  --  10  CREATININE 0.55 0.51 0.58  GLUCOSE 279*  --  192*  CALCIUM 9.7  --  9.2   No results found for this basename: LABPT, INR,  in the last 72 hours  Neurologically intact ABD soft Neurovascular intact Sensation intact distally Intact pulses distally Dorsiflexion/Plantar flexion intact Incision: dressing C/D/I and no drainage No cellulitis present Compartment soft no calf pain or sign of DVT  Assessment/Plan: 1 Day Post-Op Procedure(s) (LRB): LEFT MINI OPEN ROTATOR CUFF REPAIR  (Left) Advance diet Up with therapy D/C IV fluids PT today per RCR protocol Discussed post-op precautions, instructions,  dressing, use of sling Plan D/C later today after PT as long as pain remains controlled Discussed medication options at length. I am hesitant to send her on UItram since she is on Flexeril, Elavil, Lyrica, Xanax. Flexeril does help her and it interacts with Ultram. Will Rx Norco with Rx for Atarax for itching prn since benadryl did not help itching in the past. She will call the office if this is not working for her. Discussed with Dr. Valentino Saxon M. Bissell 07/28/2013, 9:09 AM

## 2013-07-31 ENCOUNTER — Encounter (HOSPITAL_COMMUNITY): Payer: Self-pay | Admitting: Specialist

## 2013-08-21 ENCOUNTER — Encounter: Payer: Self-pay | Admitting: Family Medicine

## 2013-08-21 ENCOUNTER — Ambulatory Visit (INDEPENDENT_AMBULATORY_CARE_PROVIDER_SITE_OTHER): Payer: Self-pay | Admitting: Family Medicine

## 2013-08-21 VITALS — BP 130/68 | HR 64 | Temp 97.8°F | Resp 14 | Ht 62.75 in | Wt 194.0 lb

## 2013-08-21 DIAGNOSIS — M797 Fibromyalgia: Secondary | ICD-10-CM

## 2013-08-21 DIAGNOSIS — IMO0002 Reserved for concepts with insufficient information to code with codable children: Secondary | ICD-10-CM

## 2013-08-21 DIAGNOSIS — E7800 Pure hypercholesterolemia, unspecified: Secondary | ICD-10-CM

## 2013-08-21 DIAGNOSIS — E1165 Type 2 diabetes mellitus with hyperglycemia: Secondary | ICD-10-CM

## 2013-08-21 DIAGNOSIS — IMO0001 Reserved for inherently not codable concepts without codable children: Secondary | ICD-10-CM

## 2013-08-21 DIAGNOSIS — I10 Essential (primary) hypertension: Secondary | ICD-10-CM

## 2013-08-21 MED ORDER — PREGABALIN 300 MG PO CAPS: 300.0000 mg | ORAL_CAPSULE | Freq: Two times a day (BID) | ORAL | Status: DC

## 2013-08-21 NOTE — Assessment & Plan Note (Signed)
Repeat labs with endocrine Will try to coordinate labs as she is uninsured

## 2013-08-21 NOTE — Assessment & Plan Note (Signed)
Labs to be done by endocrine She has insulin to get her to endocrine appt, if needed can use Novolin 70/30

## 2013-08-21 NOTE — Progress Notes (Signed)
Patient ID: [name], female   DOB: Dec 07, 1965, 48 y.o.   MRN: [id number]   Subjective:    Patient ID: [name], female    DOB: 10-Jul-1965, 48 y.o.   MRN: [id number]  Patient presents for 3 month F/U  patient here to follow chronic medical problems. She is off her medications with exception of her Lyrica because she is unsure she cannot afford this. She has a form from the drug company today to see we can get this for free on the patient assistance program. She still following with her endocrinologist and is due to see him in July will have fasting blood work next month. She has no new concerns today. She is status post second repair of her left rotator cuff to    Review Of Systems:  GEN- denies fatigue, fever, weight loss,weakness, recent illness HEENT- denies eye drainage, change in vision, nasal discharge, CVS- denies chest pain, palpitations RESP- denies SOB, cough, wheeze ABD- denies N/V, change in stools, abd pain GU- denies dysuria, hematuria, dribbling, incontinence MSK- denies joint pain, muscle aches, injury Neuro- denies headache, dizziness, syncope, seizure activity       Objective:    BP 130/68  Pulse 64  Temp(Src) 97.8 F (36.6 C) (Oral)  Resp 14  Ht 5' 2.75" (1.594 m)  Wt 194 lb (87.998 kg)  BMI 34.63 kg/m2 GEN- NAD, alert and oriented x3 HEENT- PERRL, EOMI, non injected sclera, pink conjunctiva, MMM, oropharynx clear CVS- RRR, no murmur RESP-CTAB EXT- No edema Pulses- Radial, DP- 2+        Assessment & Plan:      Problem List Items Addressed This Visit   None      Note: This dictation was prepared with Dragon dictation along with smaller phrase technology. Any transcriptional errors that result from this process are unintentional.

## 2013-08-21 NOTE — Patient Instructions (Signed)
Continue current medications F/U 4 months  

## 2013-08-21 NOTE — Assessment & Plan Note (Signed)
Form completed for Lyrica

## 2013-08-21 NOTE — Assessment & Plan Note (Signed)
BP looks good, has not tolerated even small doses of meds

## 2013-10-28 ENCOUNTER — Other Ambulatory Visit: Payer: Self-pay | Admitting: Family Medicine

## 2013-10-28 DIAGNOSIS — I10 Essential (primary) hypertension: Secondary | ICD-10-CM

## 2013-10-28 DIAGNOSIS — D72829 Elevated white blood cell count, unspecified: Secondary | ICD-10-CM

## 2013-10-28 DIAGNOSIS — IMO0002 Reserved for concepts with insufficient information to code with codable children: Secondary | ICD-10-CM

## 2013-10-28 DIAGNOSIS — E1165 Type 2 diabetes mellitus with hyperglycemia: Secondary | ICD-10-CM

## 2013-10-28 DIAGNOSIS — Z79899 Other long term (current) drug therapy: Secondary | ICD-10-CM

## 2013-10-28 DIAGNOSIS — E7800 Pure hypercholesterolemia, unspecified: Secondary | ICD-10-CM

## 2013-11-02 ENCOUNTER — Other Ambulatory Visit (HOSPITAL_COMMUNITY): Payer: Self-pay | Admitting: Specialist

## 2013-11-02 DIAGNOSIS — M25512 Pain in left shoulder: Secondary | ICD-10-CM

## 2013-11-08 ENCOUNTER — Telehealth: Payer: Self-pay | Admitting: *Deleted

## 2013-11-08 NOTE — Telephone Encounter (Signed)
Returned call to patient.   Reports that she goes to endocrinologist, but he is currently out of the [place].   States that due to insurance, she has not been able to pay for her insulin and has been using samples. Endocrinology office is currently out of samples of Humalin 70/30, and our office is currently out of stock as well.   Reported that Endocrinology office advised patient to request MD switch her prescription to something we do have samples of.   MD please advise.

## 2013-11-08 NOTE — Telephone Encounter (Signed)
Message copied by Sheral Flow on Wed Nov 08, 2013 10:53 AM ------      Message from: Lenore Manner      Created: Wed Nov 08, 2013 10:36 AM      Regarding: insulin      Contact: [phone]       Pt is needing to speak to you about some insulin  ------

## 2013-11-08 NOTE — Telephone Encounter (Signed)
Call placed to patient and patient made aware.  

## 2013-11-08 NOTE — Telephone Encounter (Signed)
We have a 2 boxes of Novlin 70/30, she can use those for now

## 2013-11-14 ENCOUNTER — Ambulatory Visit (HOSPITAL_COMMUNITY)
Admission: RE | Admit: 2013-11-14 | Discharge: 2013-11-14 | Disposition: A | Discharge status: Home / Self Care | Payer: Self-pay | Source: Ambulatory Visit | Attending: Specialist | Admitting: Specialist

## 2013-11-14 DIAGNOSIS — M25512 Pain in left shoulder: Secondary | ICD-10-CM

## 2013-11-14 DIAGNOSIS — M25519 Pain in unspecified shoulder: Secondary | ICD-10-CM | POA: Insufficient documentation

## 2013-11-28 ENCOUNTER — Telehealth: Payer: Self-pay | Admitting: Family Medicine

## 2013-11-28 NOTE — Telephone Encounter (Signed)
Patient can come pick up the NovoLog 75/25 that we have she will likely need to decrease her insulin to make sure she takes it with a meal I would decrease to 30 units twice a day as this worse low bit different. She is unable to afford her insulin there is nothing else that we can provide from the office she needs to go to the health department for her care with a can provide her with medications and insulin

## 2013-11-28 NOTE — Telephone Encounter (Signed)
MD please advise

## 2013-11-28 NOTE — Telephone Encounter (Signed)
Pt had called wanting to know if we had the novolog 70/30 and we don't have any in. She does not have insurance and she is wanting to know what she needs to do because she is out.  [phone]

## 2013-11-28 NOTE — Telephone Encounter (Signed)
Call placed to patient and patient made aware.  

## 2013-12-18 ENCOUNTER — Other Ambulatory Visit: Payer: Self-pay | Admitting: Family Medicine

## 2013-12-19 ENCOUNTER — Telehealth: Payer: Self-pay | Admitting: Family Medicine

## 2013-12-19 NOTE — Telephone Encounter (Signed)
[phone]  Pt is needing to speak to you about her prescription ALPRAZolam (XANAX) 1 MG tablet

## 2013-12-19 NOTE — Telephone Encounter (Signed)
Call placed to patient.   Reports that she requires refill on Xanax.   Advised that prescription had been called to [facility] on 12/19/2013.

## 2013-12-19 NOTE — Telephone Encounter (Signed)
Ok to refill??  Last office visit 08/21/2013.  Last refill 05/25/2013, #3 refills.

## 2013-12-19 NOTE — Telephone Encounter (Signed)
Give 2 refills

## 2013-12-19 NOTE — Telephone Encounter (Signed)
Medication called to pharmacy. 

## 2013-12-22 ENCOUNTER — Ambulatory Visit: Payer: Self-pay | Admitting: Family Medicine

## 2014-01-02 ENCOUNTER — Ambulatory Visit: Payer: Self-pay | Admitting: Family Medicine

## 2014-01-16 ENCOUNTER — Encounter: Payer: Self-pay | Admitting: *Deleted

## 2014-01-16 LAB — BLOOD PRESSURE CHECK BP00
A1c: 10.2
LDL Cholesterol: 103 mg/dL

## 2014-02-27 ENCOUNTER — Other Ambulatory Visit (HOSPITAL_COMMUNITY): Payer: Self-pay | Admitting: Family Medicine

## 2014-02-27 DIAGNOSIS — Z1231 Encounter for screening mammogram for malignant neoplasm of breast: Secondary | ICD-10-CM

## 2014-03-03 ENCOUNTER — Other Ambulatory Visit: Payer: Self-pay

## 2014-03-03 ENCOUNTER — Inpatient Hospital Stay (HOSPITAL_COMMUNITY)
Admission: EM | Admit: 2014-03-03 | Discharge: 2014-03-06 | DRG: 246 | Disposition: A | Discharge status: Home / Self Care | Payer: Self-pay | Attending: Cardiology | Admitting: Cardiology

## 2014-03-03 ENCOUNTER — Encounter (HOSPITAL_COMMUNITY)
Admission: EM | Disposition: A | Discharge status: Home / Self Care | Payer: MEDICAID | Source: Home / Self Care | Attending: Cardiology

## 2014-03-03 ENCOUNTER — Encounter (HOSPITAL_COMMUNITY): Payer: Self-pay

## 2014-03-03 DIAGNOSIS — R0600 Dyspnea, unspecified: Secondary | ICD-10-CM

## 2014-03-03 DIAGNOSIS — R001 Bradycardia, unspecified: Secondary | ICD-10-CM

## 2014-03-03 DIAGNOSIS — K219 Gastro-esophageal reflux disease without esophagitis: Secondary | ICD-10-CM | POA: Diagnosis present

## 2014-03-03 DIAGNOSIS — Z794 Long term (current) use of insulin: Secondary | ICD-10-CM

## 2014-03-03 DIAGNOSIS — I2111 ST elevation (STEMI) myocardial infarction involving right coronary artery: Secondary | ICD-10-CM

## 2014-03-03 DIAGNOSIS — E119 Type 2 diabetes mellitus without complications: Secondary | ICD-10-CM | POA: Diagnosis present

## 2014-03-03 DIAGNOSIS — E780 Pure hypercholesterolemia: Secondary | ICD-10-CM | POA: Diagnosis present

## 2014-03-03 DIAGNOSIS — F419 Anxiety disorder, unspecified: Secondary | ICD-10-CM | POA: Diagnosis present

## 2014-03-03 DIAGNOSIS — K7581 Nonalcoholic steatohepatitis (NASH): Secondary | ICD-10-CM | POA: Diagnosis present

## 2014-03-03 DIAGNOSIS — Z87891 Personal history of nicotine dependence: Secondary | ICD-10-CM

## 2014-03-03 DIAGNOSIS — E785 Hyperlipidemia, unspecified: Secondary | ICD-10-CM | POA: Diagnosis present

## 2014-03-03 DIAGNOSIS — I10 Essential (primary) hypertension: Secondary | ICD-10-CM

## 2014-03-03 DIAGNOSIS — K760 Fatty (change of) liver, not elsewhere classified: Secondary | ICD-10-CM | POA: Diagnosis present

## 2014-03-03 DIAGNOSIS — E1165 Type 2 diabetes mellitus with hyperglycemia: Secondary | ICD-10-CM | POA: Diagnosis present

## 2014-03-03 DIAGNOSIS — I255 Ischemic cardiomyopathy: Secondary | ICD-10-CM | POA: Diagnosis present

## 2014-03-03 DIAGNOSIS — E876 Hypokalemia: Secondary | ICD-10-CM | POA: Diagnosis not present

## 2014-03-03 DIAGNOSIS — M797 Fibromyalgia: Secondary | ICD-10-CM

## 2014-03-03 DIAGNOSIS — I2119 ST elevation (STEMI) myocardial infarction involving other coronary artery of inferior wall: Principal | ICD-10-CM | POA: Diagnosis present

## 2014-03-03 DIAGNOSIS — I959 Hypotension, unspecified: Secondary | ICD-10-CM | POA: Diagnosis present

## 2014-03-03 DIAGNOSIS — I2510 Atherosclerotic heart disease of native coronary artery without angina pectoris: Secondary | ICD-10-CM

## 2014-03-03 DIAGNOSIS — Z955 Presence of coronary angioplasty implant and graft: Secondary | ICD-10-CM

## 2014-03-03 DIAGNOSIS — I2582 Chronic total occlusion of coronary artery: Secondary | ICD-10-CM | POA: Diagnosis present

## 2014-03-03 DIAGNOSIS — IMO0002 Reserved for concepts with insufficient information to code with codable children: Secondary | ICD-10-CM | POA: Diagnosis present

## 2014-03-03 DIAGNOSIS — I213 ST elevation (STEMI) myocardial infarction of unspecified site: Secondary | ICD-10-CM

## 2014-03-03 DIAGNOSIS — I5031 Acute diastolic (congestive) heart failure: Secondary | ICD-10-CM | POA: Diagnosis present

## 2014-03-03 DIAGNOSIS — D72829 Elevated white blood cell count, unspecified: Secondary | ICD-10-CM | POA: Diagnosis not present

## 2014-03-03 HISTORY — PX: LEFT HEART CATH: SHX5478

## 2014-03-03 HISTORY — DX: Nonalcoholic steatohepatitis (NASH): K75.81

## 2014-03-03 HISTORY — PX: CORONARY ANGIOPLASTY WITH STENT PLACEMENT: SHX49

## 2014-03-03 HISTORY — DX: Atherosclerotic heart disease of native coronary artery without angina pectoris: I25.10

## 2014-03-03 HISTORY — DX: Hyperlipidemia, unspecified: E78.5

## 2014-03-03 HISTORY — DX: Ischemic cardiomyopathy: I25.5

## 2014-03-03 HISTORY — DX: Bradycardia, unspecified: R00.1

## 2014-03-03 LAB — CBC
HCT: 43.4 % (ref 36.0–46.0)
HCT: 42.4 % (ref 36.0–46.0)
Hemoglobin: 15.3 g/dL — ABNORMAL HIGH (ref 12.0–15.0)
Hemoglobin: 14.6 g/dL (ref 12.0–15.0)
MCH: 33.1 pg (ref 26.0–34.0)
MCH: 33.3 pg (ref 26.0–34.0)
MCHC: 35.3 g/dL (ref 30.0–36.0)
MCHC: 34.4 g/dL (ref 30.0–36.0)
MCV: 93.9 fL (ref 78.0–100.0)
MCV: 96.8 fL (ref 78.0–100.0)
Platelets: 373 10*3/uL (ref 150–400)
Platelets: 311 10*3/uL (ref 150–400)
RBC: 4.62 MIL/uL (ref 3.87–5.11)
RBC: 4.38 MIL/uL (ref 3.87–5.11)
RDW: 15.1 % (ref 11.5–15.5)
RDW: 15.3 % (ref 11.5–15.5)
WBC: 16.2 10*3/uL — ABNORMAL HIGH (ref 4.0–10.5)
WBC: 14.7 10*3/uL — ABNORMAL HIGH (ref 4.0–10.5)

## 2014-03-03 LAB — GLUCOSE, CAPILLARY
Glucose-Capillary: 218 mg/dL — ABNORMAL HIGH (ref 70–99)
Glucose-Capillary: 185 mg/dL — ABNORMAL HIGH (ref 70–99)

## 2014-03-03 LAB — COMPREHENSIVE METABOLIC PANEL
ALT: 21 U/L (ref 0–35)
ALT: 19 U/L (ref 0–35)
AST: 18 U/L (ref 0–37)
AST: 16 U/L (ref 0–37)
Albumin: 3.4 g/dL — ABNORMAL LOW (ref 3.5–5.2)
Albumin: 3.2 g/dL — ABNORMAL LOW (ref 3.5–5.2)
Alkaline Phosphatase: 105 U/L (ref 39–117)
Alkaline Phosphatase: 104 U/L (ref 39–117)
Anion gap: 16 — ABNORMAL HIGH (ref 5–15)
Anion gap: 15 (ref 5–15)
BUN: 13 mg/dL (ref 6–23)
BUN: 13 mg/dL (ref 6–23)
CO2: 22 mEq/L (ref 19–32)
CO2: 23 mEq/L (ref 19–32)
Calcium: 9.1 mg/dL (ref 8.4–10.5)
Calcium: 8.9 mg/dL (ref 8.4–10.5)
Chloride: 101 mEq/L (ref 96–112)
Chloride: 102 mEq/L (ref 96–112)
Creatinine, Ser: 0.52 mg/dL (ref 0.50–1.10)
Creatinine, Ser: 0.51 mg/dL (ref 0.50–1.10)
GFR calc Af Amer: >90 mL/min (ref >90)
GFR calc Af Amer: >90 mL/min (ref >90)
GFR calc non Af Amer: >90 mL/min (ref >90)
GFR calc non Af Amer: >90 mL/min (ref >90)
Glucose, Bld: 255 mg/dL — ABNORMAL HIGH (ref 70–99)
Glucose, Bld: 228 mg/dL — ABNORMAL HIGH (ref 70–99)
Potassium: 3.7 mEq/L (ref 3.7–5.3)
Potassium: 3.4 mEq/L — ABNORMAL LOW (ref 3.7–5.3)
Sodium: 139 mEq/L (ref 137–147)
Sodium: 140 mEq/L (ref 137–147)
Total Bilirubin: 0.3 mg/dL (ref 0.3–1.2)
Total Bilirubin: 0.3 mg/dL (ref 0.3–1.2)
Total Protein: 7.3 g/dL (ref 6.0–8.3)
Total Protein: 7 g/dL (ref 6.0–8.3)

## 2014-03-03 LAB — I-STAT TROPONIN, ED: Troponin i, poc: 0.05 ng/mL (ref 0.00–0.08)

## 2014-03-03 LAB — LIPID PANEL
Cholesterol: 259 mg/dL — ABNORMAL HIGH (ref 0–200)
HDL: 38 mg/dL — ABNORMAL LOW (ref >39)
LDL Cholesterol: 198 mg/dL — ABNORMAL HIGH (ref 0–99)
Total CHOL/HDL Ratio: 6.8 RATIO
Triglycerides: 114 mg/dL (ref <150)
VLDL: 23 mg/dL (ref 0–40)

## 2014-03-03 LAB — PROTIME-INR
INR: 1 (ref 0.00–1.49)
Prothrombin Time: 13.3 seconds (ref 11.6–15.2)

## 2014-03-03 LAB — CK TOTAL AND CKMB (NOT AT ARMC)
CK, MB: 2.2 ng/mL (ref 0.3–4.0)
Relative Index: 1.8 (ref 0.0–2.5)
Total CK: 124 U/L (ref 7–177)

## 2014-03-03 LAB — APTT: aPTT: 28 seconds (ref 24–37)

## 2014-03-03 LAB — MRSA PCR SCREENING: MRSA by PCR: NEGATIVE

## 2014-03-03 LAB — TROPONIN I: Troponin I: <0.3 ng/mL (ref <0.30)

## 2014-03-03 SURGERY — LEFT HEART CATH
Anesthesia: LOCAL | Laterality: Bilateral

## 2014-03-03 MED ORDER — ATORVASTATIN CALCIUM 80 MG PO TABS
80.0000 mg | ORAL_TABLET | Freq: Every day | ORAL | Status: DC
  Administered 2014-03-03 – 2014-03-05 (×3): 80 mg via ORAL
  Filled 2014-03-03 (×4): qty 1

## 2014-03-03 MED ORDER — SODIUM CHLORIDE 0.9 % IV SOLN
1.0000 mL/kg/h | INTRAVENOUS | Status: AC
  Administered 2014-03-03: 1 mL/kg/h via INTRAVENOUS

## 2014-03-03 MED ORDER — INSULIN ASPART 100 UNIT/ML ~~LOC~~ SOLN
0.0000 [IU] | Freq: Three times a day (TID) | SUBCUTANEOUS | Status: DC
  Administered 2014-03-04: 3 [IU] via SUBCUTANEOUS
  Administered 2014-03-04 – 2014-03-06 (×4): 5 [IU] via SUBCUTANEOUS

## 2014-03-03 MED ORDER — ASPIRIN 81 MG PO CHEW
81.0000 mg | CHEWABLE_TABLET | Freq: Every day | ORAL | Status: DC
  Administered 2014-03-04 – 2014-03-06 (×3): 81 mg via ORAL
  Filled 2014-03-03 (×3): qty 1

## 2014-03-03 MED ORDER — SODIUM CHLORIDE 0.9 % IV SOLN
INTRAVENOUS | Status: DC
  Administered 2014-03-04: 10 mL/h via INTRAVENOUS
  Administered 2014-03-04: 09:00:00 via INTRAVENOUS

## 2014-03-03 MED ORDER — HEPARIN SODIUM (PORCINE) 5000 UNIT/ML IJ SOLN
4000.0000 [IU] | INTRAMUSCULAR | Status: AC
  Administered 2014-03-03: 4000 [IU] via INTRAVENOUS

## 2014-03-03 MED ORDER — INSULIN ASPART PROT & ASPART (70-30 MIX) 100 UNIT/ML ~~LOC~~ SUSP
50.0000 [IU] | Freq: Two times a day (BID) | SUBCUTANEOUS | Status: DC
  Administered 2014-03-03 – 2014-03-06 (×5): 50 [IU] via SUBCUTANEOUS
  Filled 2014-03-03 (×2): qty 10

## 2014-03-03 MED ORDER — INSULIN ASPART 100 UNIT/ML ~~LOC~~ SOLN: 0.0000 [IU] | Freq: Every day | SUBCUTANEOUS | Status: DC

## 2014-03-03 MED ORDER — INFLUENZA VAC SPLIT QUAD 0.5 ML IM SUSY
0.5000 mL | PREFILLED_SYRINGE | INTRAMUSCULAR | Status: AC
  Administered 2014-03-04: 0.5 mL via INTRAMUSCULAR
  Filled 2014-03-03: qty 0.5

## 2014-03-03 MED ORDER — ACETAMINOPHEN 325 MG PO TABS
650.0000 mg | ORAL_TABLET | ORAL | Status: DC | PRN
  Filled 2014-03-03: qty 2

## 2014-03-03 MED ORDER — BIVALIRUDIN 250 MG IV SOLR
INTRAVENOUS | Status: AC
  Filled 2014-03-03: qty 250

## 2014-03-03 MED ORDER — MIDAZOLAM HCL 2 MG/2ML IJ SOLN
INTRAMUSCULAR | Status: AC
  Filled 2014-03-03: qty 2

## 2014-03-03 MED ORDER — HEPARIN SODIUM (PORCINE) 1000 UNIT/ML IJ SOLN
INTRAMUSCULAR | Status: AC
  Filled 2014-03-03: qty 1

## 2014-03-03 MED ORDER — HEPARIN (PORCINE) IN NACL 2-0.9 UNIT/ML-% IJ SOLN
INTRAMUSCULAR | Status: AC
  Filled 2014-03-03: qty 1500

## 2014-03-03 MED ORDER — HEPARIN SODIUM (PORCINE) 5000 UNIT/ML IJ SOLN
5000.0000 [IU] | Freq: Three times a day (TID) | INTRAMUSCULAR | Status: DC
  Administered 2014-03-04: 5000 [IU] via SUBCUTANEOUS
  Filled 2014-03-03 (×4): qty 1

## 2014-03-03 MED ORDER — TICAGRELOR 90 MG PO TABS
ORAL_TABLET | ORAL | Status: AC
  Filled 2014-03-03: qty 2

## 2014-03-03 MED ORDER — TICAGRELOR 90 MG PO TABS
90.0000 mg | ORAL_TABLET | Freq: Two times a day (BID) | ORAL | Status: DC
  Administered 2014-03-03 – 2014-03-05 (×4): 90 mg via ORAL
  Filled 2014-03-03 (×5): qty 1

## 2014-03-03 MED ORDER — NITROGLYCERIN 1 MG/10 ML FOR IR/CATH LAB
INTRA_ARTERIAL | Status: AC
  Filled 2014-03-03: qty 10

## 2014-03-03 MED ORDER — ONDANSETRON HCL 4 MG/2ML IJ SOLN: 4.0000 mg | Freq: Four times a day (QID) | INTRAMUSCULAR | Status: DC | PRN

## 2014-03-03 MED ORDER — LIDOCAINE HCL (PF) 1 % IJ SOLN
INTRAMUSCULAR | Status: AC
  Filled 2014-03-03: qty 30

## 2014-03-03 NOTE — H&P (Signed)
ADMISSION HISTORY & PHYSICAL   Chief Complaint:  Chest pain, STEMI  Cardiologist: None (new to Dr. Martinique)  Primary Care Physician: Vic Blackbird, MD  HPI:  This is a 48 y.o. female with a past medical history significant for hypertension, fibromyalgia, dyslipidemia and DM2, uncontrolled. She presented with chest pain that feels like a brick on her chest, radiating to both arms, with DOE and nausea. Symptoms persisted approximately 1 hour prior to arrival. She had little relief with nitroglycerin, however, had hypotension by EMS enroute. EKG demonstrates 1-2 mm inferior ST elevation with inferior and lateral T-wave inversions.  CODE STEMI was activated and she was taken by Dr. Martinique to the cath lab. She was noted to have a proximally occluded RCA, which was successfully stented. Also noted to have an ulcerated plaque in the mid-LAD which appears to be around 70% stenosed.  PMHx:  Past Medical History  Diagnosis Date  . Diabetes mellitus   . Hypertension   . High cholesterol   . Clostridium difficile colitis   . Sea-blue histiocyte syndrome   . Leukocytosis     Dr Tressie Stalker  . Fibromyalgia   . Neuropathy, peripheral   . Upper respiratory infection 2/15    states has dry non productive cough since then- "tickle in my throat" no fever  . Anxiety   . GERD (gastroesophageal reflux disease)   . H/O hiatal hernia     Past Surgical History  Procedure Laterality Date  . Splenectomy    . Tonsillectomy    . Abdominal hysterectomy      partial  . Esophagogastroduodenoscopy  08/27/2011    Rourk-reactive gastropathy, normal, patent esophagus. Stomach empty. Multiple linear antral erosions. No ulcer or infiltrating process. Small hiatal hernia. Patent pylorus. Normal first and second portion of the duodenum  . Colonoscopy  12/07/2011    Rourk-normal rectum,, colon and terminal ileum  . Shoulder arthroscopy with open rotator cuff repair Left 03/02/2013    Procedure: LEFT SHOULDER  ARTHROSCOPY, SUBACROMIAL DECOMPRESSION, MANIPULATION UNDER ANESTHESIA;  Surgeon: [name], MD;  Location: [facility];  Service: Orthopedics;  Laterality: Left;  . Shoulder open rotator cuff repair Left 07/27/2013    Procedure: LEFT MINI OPEN ROTATOR CUFF REPAIR ;  Surgeon: [name], MD;  Location: [facility];  Service: Orthopedics;  Laterality: Left;    FAMHx:  Family History  Problem Relation Age of Onset  . Colon cancer Neg Hx   . Arthritis Mother   . Diabetes Father   . Kidney disease Father     SOCHx:   reports that she quit smoking about 7 years ago. Her smoking use included Cigarettes. She has a 30 pack-year smoking history. She has never used smokeless tobacco. She reports that she does not drink alcohol or use illicit drugs.  ALLERGIES:  Allergies  Allergen Reactions  . Aspartame And Phenylalanine Shortness Of Breath and Swelling    Throat swells  ANY ARTIFICAL SWEETNERS  . Toradol [Ketorolac Tromethamine] Other (See Comments)    Face & neck flushed red & pt felt very hot after IV adm.  . Sucralose Rash    Pt.s face broke out in rash after drinking Breeza.  Pt. Claims it is from the artificial sweetener.     . Dilaudid [Hydromorphone Hcl]     Itch  . Hydrocodone Itching  . Oxycodone Rash    itching    ROS: A comprehensive review of systems was negative except for: Cardiovascular: positive for chest pressure/discomfort  HOME MEDS: Medications Prior to Admission  Medication Sig Dispense Refill  . acetaminophen (TYLENOL) 500 MG tablet Take 1,000 mg by mouth every 6 (six) hours as needed for mild pain or moderate pain.     Marland Kitchen ALPRAZolam (XANAX) 1 MG tablet TAKE ONE TABLET BY MOUTH TWICE DAILY 60 tablet 2  . amitriptyline (ELAVIL) 50 MG tablet Take 50 mg by mouth at bedtime.    . insulin NPH-regular (HUMULIN 70/30) (70-30) 100 UNIT/ML injection Inject 50 Units into the skin 2 (two) times daily with a meal.     . metFORMIN (GLUCOPHAGE) 1000 MG tablet Take 1,000 mg by  mouth 2 (two) times daily with a meal.    . pravastatin (PRAVACHOL) 40 MG tablet Take 40 mg by mouth every morning.    . pregabalin (LYRICA) 300 MG capsule Take 1 capsule (300 mg total) by mouth 2 (two) times daily. 60 capsule 3  . ranitidine (ZANTAC) 150 MG tablet Take 150 mg by mouth 2 (two) times daily. Alternate with pepcid    . traMADol (ULTRAM) 50 MG tablet Take 1 tablet (50 mg total) by mouth every 6 (six) hours as needed. 60 tablet 1    LABS/IMAGING: Results for orders placed or performed during the hospital encounter of 03/03/14 (from the past 48 hour(s))  APTT     Status: None   Collection Time: 03/03/14  2:32 PM  Result Value Ref Range   aPTT 28 24 - 37 seconds  CBC     Status: Abnormal   Collection Time: 03/03/14  2:32 PM  Result Value Ref Range   WBC 16.2 (H) 4.0 - 10.5 K/uL   RBC 4.62 3.87 - 5.11 MIL/uL   Hemoglobin 15.3 (H) 12.0 - 15.0 g/dL   HCT 43.4 36.0 - 46.0 %   MCV 93.9 78.0 - 100.0 fL   MCH 33.1 26.0 - 34.0 pg   MCHC 35.3 30.0 - 36.0 g/dL   RDW 15.1 11.5 - 15.5 %   Platelets 373 150 - 400 K/uL  Comprehensive metabolic panel     Status: Abnormal   Collection Time: 03/03/14  2:32 PM  Result Value Ref Range   Sodium 139 137 - 147 mEq/L   Potassium 3.7 3.7 - 5.3 mEq/L   Chloride 101 96 - 112 mEq/L   CO2 22 19 - 32 mEq/L   Glucose, Bld 255 (H) 70 - 99 mg/dL   BUN 13 6 - 23 mg/dL   Creatinine, Ser 0.52 0.50 - 1.10 mg/dL   Calcium 9.1 8.4 - 10.5 mg/dL   Total Protein 7.3 6.0 - 8.3 g/dL   Albumin 3.4 (L) 3.5 - 5.2 g/dL   AST 18 0 - 37 U/L   ALT 21 0 - 35 U/L   Alkaline Phosphatase 105 39 - 117 U/L   Total Bilirubin 0.3 0.3 - 1.2 mg/dL   GFR calc non Af Amer >90 >90 mL/min   GFR calc Af Amer >90 >90 mL/min    Comment: (NOTE) The eGFR has been calculated using the CKD EPI equation. This calculation has not been validated in all clinical situations. eGFR's persistently <90 mL/min signify possible Chronic Kidney Disease.    Anion gap 16 (H) 5 - 15    Protime-INR     Status: None   Collection Time: 03/03/14  2:32 PM  Result Value Ref Range   Prothrombin Time 13.3 11.6 - 15.2 seconds   INR 1.00 0.00 - 1.49  I-Stat Troponin, ED (not at [facility])  Status: None   Collection Time: 03/03/14  2:39 PM  Result Value Ref Range   Troponin i, poc 0.05 0.00 - 0.08 ng/mL   Comment 3            Comment: Due to the release kinetics of cTnI, a negative result within the first hours of the onset of symptoms does not rule out myocardial infarction with certainty. If myocardial infarction is still suspected, repeat the test at appropriate intervals.    No results found.  VITALS: Filed Vitals:   03/03/14 1431  BP: 112/76  Resp: 18    EXAM: General appearance: alert and no distress Neck: no carotid bruit and no JVD Lungs: clear to auscultation bilaterally Heart: regular rate and rhythm, S1, S2 normal, no murmur, click, rub or gallop Abdomen: soft, non-tender; bowel sounds normal; no masses,  no organomegaly Extremities: extremities normal, atraumatic, no cyanosis or edema Pulses: 2+ and symmetric Skin: Skin color, texture, turgor normal. No rashes or lesions Neurologic: Grossly normal Psych: Normal  IMPRESSION: Principal Problem:   ST elevation myocardial infarction (STEMI) involving other coronary artery of inferior wall Active Problems:   Hypertension   Hyperlipidemia   Diabetes type 2, uncontrolled   Nonalcoholic fatty liver disease   Fibromyalgia   PLAN: 1. Inferior STEMI - s/p PCI to the proximal RCA. Continue DAPT, high dose statin, b-blocker. Check 2D echo. There is an ulcerated plaque in the LAD as well, which will require PCI prior to discharge, probably on Monday. 2. HTN - avoid b-blocker due to RCA involvement. Had transient hypotension, but preserved LV Function by cath. 3. Dyslipidemia- change pravastatin to lipitor 80 mg. 4. IDDM - Continue 70/30 insulin 50U BID wAC, + moderate SSI with AC/HS BG's. 5. NAFLD - monitor  LFT's on high dose statin.  Pixie Casino, MD, [facility] Attending Cardiologist [facility]  Emmalee Solivan C 03/03/2014, 3:50 PM

## 2014-03-03 NOTE — Progress Notes (Signed)
   03/03/14 1446  Clinical Encounter Type  Visited With Patient;Health care provider  Visit Type Code;ED   Chaplain responded to code stemi page. Chaplain spoke briefly with pt. Pt's mother not present but on the way to hospital. Chaplain asked front desk to page chaplain when mother arrives.   [name] 03/03/2014 2:48 PM

## 2014-03-03 NOTE — Progress Notes (Signed)
Chaplain was paged when pt's mother arrived. Chaplain gave mother pt's belongings, escorted her to cath lab waiting area, and provided hospitality.   [name] 03/03/2014 3:32 PM

## 2014-03-03 NOTE — Significant Event (Signed)
Increased bradycardia to 40s noted in setting of (1) RCA STEMI and (2) new ticagrelor start. Vital signs from earlier today suggest that she had a normal HR for much of the day (including when she had an occluded artery) raising concern that she may be experiencing bradycardia as a side effect of ticagrelor. She already received her evening dose and is not due again until 10am.    Plan 1. Run tele in AM (prior 10am)  2. May need to switch ticagrelor to prasugrel or clopidogrel if persistent bradycardia

## 2014-03-03 NOTE — ED Notes (Signed)
Patient presents today from home via EMS with a chief complaint of substernal chest pain with radiation to neck that began this afternoon around 1am. She reports the pain feels like a pressure in her chest. She had 1 SL nitro and 324 ASA and 66mcg of fentanyl prior to arrival.

## 2014-03-03 NOTE — ED Provider Notes (Signed)
CSN: [id number]     Arrival date & time 03/03/14  1429 History   First MD Initiated Contact with Patient 03/03/14 1428     Chief Complaint  Patient presents with  . Chest Pain     (Consider location/radiation/quality/duration/timing/severity/associated sxs/prior Treatment) HPI Comments: The patient is a 48 year old female who has a history of diabetes on insulin and oral medications, also with high cholesterol and hypertension. She presents with a complaint of chest pain which she states feels like a heavy brick sitting on her chest, radiation to both arms, shortness of breath, nausea. This occurred approximately 1 hour prior to arrival, the symptoms are persistent, severe, improved slightly with nitroglycerin. She has no history of cardiac disease, she walks 1-1/2 miles a day most days and never has to stop, she is unaware of any family history, she does smoke cigarettes.the patient has never had a stress test or heart catheterization and does not see a cardiologist. The paramedics did give nitroglycerin prior to arrival with a significant drop in blood pressure of approximately 40 millimeters of mercury, small fluid bolus given and nitroglycerin withheld after this. Aspirin given prior to arrival, 75 g of fentanyl given.  The history is provided by the patient and the EMS personnel.    Past Medical History  Diagnosis Date  . Diabetes mellitus   . Hypertension   . High cholesterol   . Clostridium difficile colitis   . Sea-blue histiocyte syndrome   . Leukocytosis     Dr Tressie Stalker  . Fibromyalgia   . Neuropathy, peripheral   . Upper respiratory infection 2/15    states has dry non productive cough since then- "tickle in my throat" no fever  . Anxiety   . GERD (gastroesophageal reflux disease)   . H/O hiatal hernia    Past Surgical History  Procedure Laterality Date  . Splenectomy    . Tonsillectomy    . Abdominal hysterectomy      partial  . Esophagogastroduodenoscopy   08/27/2011    Rourk-reactive gastropathy, normal, patent esophagus. Stomach empty. Multiple linear antral erosions. No ulcer or infiltrating process. Small hiatal hernia. Patent pylorus. Normal first and second portion of the duodenum  . Colonoscopy  12/07/2011    Rourk-normal rectum,, colon and terminal ileum  . Shoulder arthroscopy with open rotator cuff repair Left 03/02/2013    Procedure: LEFT SHOULDER ARTHROSCOPY, SUBACROMIAL DECOMPRESSION, MANIPULATION UNDER ANESTHESIA;  Surgeon: [name], MD;  Location: [facility];  Service: Orthopedics;  Laterality: Left;  . Shoulder open rotator cuff repair Left 07/27/2013    Procedure: LEFT MINI OPEN ROTATOR CUFF REPAIR ;  Surgeon: [name], MD;  Location: [facility];  Service: Orthopedics;  Laterality: Left;   Family History  Problem Relation Age of Onset  . Colon cancer Neg Hx   . Arthritis Mother   . Diabetes Father   . Kidney disease Father    History  Substance Use Topics  . Smoking status: Former Smoker -- 0.25 packs/day for 30 years    Types: Cigarettes    Quit date: 04/20/2006  . Smokeless tobacco: Never Used  . Alcohol Use: No   OB History    No data available     Review of Systems  All other systems reviewed and are negative.     Allergies  Aspartame and phenylalanine; Toradol; Sucralose; Dilaudid; Hydrocodone; and Oxycodone  Home Medications   Prior to Admission medications   Medication Sig Start Date End Date Taking? Authorizing Provider  acetaminophen (  TYLENOL) 500 MG tablet Take 1,000 mg by mouth every 6 (six) hours as needed for mild pain or moderate pain.    Yes Historical Provider, MD  alprazolam Duanne Moron) 2 MG tablet Take 2 mg by mouth 2 (two) times daily as needed for sleep or anxiety.   Yes Historical Provider, MD  amitriptyline (ELAVIL) 50 MG tablet Take 50 mg by mouth at bedtime.   Yes Historical Provider, MD  Insulin Glargine (LANTUS SOLOSTAR) 100 UNIT/ML Solostar Pen Inject 50 Units into the skin 2 (two)  times daily.   Yes Historical Provider, MD  metFORMIN (GLUCOPHAGE) 1000 MG tablet Take 1,000 mg by mouth 2 (two) times daily with a meal.   Yes Historical Provider, MD  pravastatin (PRAVACHOL) 40 MG tablet Take 40 mg by mouth every morning.   Yes Historical Provider, MD  pregabalin (LYRICA) 300 MG capsule Take 1 capsule (300 mg total) by mouth 2 (two) times daily. 08/21/13  Yes Alycia Rossetti, MD  ranitidine (ZANTAC) 150 MG tablet Take 150 mg by mouth 2 (two) times daily. Alternate with pepcid   Yes Historical Provider, MD  traMADol (ULTRAM) 50 MG tablet Take 1 tablet (50 mg total) by mouth every 6 (six) hours as needed. 07/28/13  Yes [name], MD   BP 91/70 mmHg  Pulse 53  Temp(Src) 98.5 F (36.9 C) (Oral)  Resp 14  Ht 5\' 2"  (1.575 m)  Wt 184 lb 15.5 oz (83.9 kg)  BMI 33.82 kg/m2  SpO2 100% Physical Exam  Constitutional: She appears well-developed and well-nourished. She appears distressed.  HENT:  Head: Normocephalic and atraumatic.  Mouth/Throat: Oropharynx is clear and moist. No oropharyngeal exudate.  Eyes: Conjunctivae and EOM are normal. Pupils are equal, round, and reactive to light. Right eye exhibits no discharge. Left eye exhibits no discharge. No scleral icterus.  Neck: Normal range of motion. Neck supple. No JVD present. No thyromegaly present.  Cardiovascular: Normal rate, regular rhythm, normal heart sounds and intact distal pulses.  Exam reveals no gallop and no friction rub.   No murmur heard. Pulmonary/Chest: Effort normal and breath sounds normal. No respiratory distress. She has no wheezes. She has no rales.  Abdominal: Soft. Bowel sounds are normal. She exhibits no distension and no mass. There is no tenderness.  Musculoskeletal: Normal range of motion. She exhibits no edema or tenderness.  Lymphadenopathy:    She has no cervical adenopathy.  Neurological: She is alert. Coordination normal.  Skin: Skin is warm and dry. No rash noted. No erythema.   Psychiatric: She has a normal mood and affect. Her behavior is normal.  Nursing note and vitals reviewed.   ED Course  Procedures (including critical care time) Labs Review Labs Reviewed  CBC - Abnormal; Notable for the following:    WBC 16.2 (*)    Hemoglobin 15.3 (*)    All other components within normal limits  COMPREHENSIVE METABOLIC PANEL - Abnormal; Notable for the following:    Glucose, Bld 255 (*)    Albumin 3.4 (*)    Anion gap 16 (*)    All other components within normal limits  CBC - Abnormal; Notable for the following:    WBC 14.7 (*)    All other components within normal limits  COMPREHENSIVE METABOLIC PANEL - Abnormal; Notable for the following:    Potassium 3.4 (*)    Glucose, Bld 228 (*)    Albumin 3.2 (*)    All other components within normal limits  HEMOGLOBIN A1C -  Abnormal; Notable for the following:    Hgb A1c MFr Bld 10.8 (*)    Mean Plasma Glucose 263 (*)    All other components within normal limits  LIPID PANEL - Abnormal; Notable for the following:    Cholesterol 259 (*)    HDL 38 (*)    LDL Cholesterol 198 (*)    All other components within normal limits  BASIC METABOLIC PANEL - Abnormal; Notable for the following:    Potassium 3.6 (*)    Glucose, Bld 181 (*)    Creatinine, Ser 0.47 (*)    All other components within normal limits  CBC - Abnormal; Notable for the following:    WBC 14.5 (*)    All other components within normal limits  HEMOGLOBIN A1C - Abnormal; Notable for the following:    Hgb A1c MFr Bld 11.2 (*)    Mean Plasma Glucose 275 (*)    All other components within normal limits  GLUCOSE, CAPILLARY - Abnormal; Notable for the following:    Glucose-Capillary 218 (*)    All other components within normal limits  GLUCOSE, CAPILLARY - Abnormal; Notable for the following:    Glucose-Capillary 185 (*)    All other components within normal limits  GLUCOSE, CAPILLARY - Abnormal; Notable for the following:    Glucose-Capillary 229 (*)     All other components within normal limits  HEPARIN LEVEL (UNFRACTIONATED) - Abnormal; Notable for the following:    Heparin Unfractionated <0.10 (*)    All other components within normal limits  GLUCOSE, CAPILLARY - Abnormal; Notable for the following:    Glucose-Capillary 153 (*)    All other components within normal limits  GLUCOSE, CAPILLARY - Abnormal; Notable for the following:    Glucose-Capillary 217 (*)    All other components within normal limits  MRSA PCR SCREENING  APTT  PROTIME-INR  CK TOTAL AND CKMB  TROPONIN I  HEPARIN LEVEL (UNFRACTIONATED)  HEPARIN LEVEL (UNFRACTIONATED)  I-STAT TROPOININ, ED    Imaging Review No results found.   EKG Interpretation None      ED ECG REPORT  I personally interpreted this EKG   Date: 03/04/2014   Rate: 56  Rhythm: sinus bradycardia  QRS Axis: normal  Intervals: normal  ST/T Wave abnormalities: ST elevations inferiorly and ST depressions laterally  Conduction Disutrbances:none  Narrative Interpretation: STEMI  Old EKG Reviewed: none available   MDM   Final diagnoses:  ST elevation myocardial infarction (STEMI), unspecified artery  Essential hypertension    The patient appears uncomfortable, her vital signs are unremarkable, her EKG is consistent with an inferior ST elevation MI with leads 3 and F and 2 with elevation. She has ST depression in leads 1 and L, we'll continue heparin, cardiac monitoring, activated code STEMI, cardiology to see the patient.  The pt was taken to the cardiology catheterization lab - no decompensation in condition while in the ED.  Meds given in ED:  Heparin  Johnna Acosta, MD 03/04/14 [phone]

## 2014-03-03 NOTE — CV Procedure (Signed)
    Cardiac Catheterization Procedure Note  Name: [name] MRN: [id number] DOB: Aug 04, 1965  Procedure: Left Heart Cath, Selective Coronary Angiography, LV angiography,  PTCA/Stent of the proximal RCA  Indication: [name] with history of IDDM, HL, and tobacco abuse presents with an Inferior STEMI   Diagnostic Procedure Details: We initially attempted right radial access. Despite getting good arterial flow with needle stick on 3 occasions we were unable to thread the wire. The right groin was prepped, draped, and anesthetized with 1% lidocaine. Using the modified Seldinger technique, a 6 French sheath was introduced into the right femoral artery. Standard Judkins catheters were used for selective coronary angiography and left ventriculography. Catheter exchanges were performed over a wire.  The diagnostic procedure was well-tolerated without immediate complications.  PROCEDURAL FINDINGS Hemodynamics: AO 109/53 mean 75 mm Hg LV 106/21 mm Hg  Coronary angiography: Coronary dominance: right  Left mainstem: Normal.   Left anterior descending (LAD): The LAD has an ulcerated complex stenosis in the mid vessel up to 70-80%.  Left circumflex (LCx): Normal.  Right coronary artery (RCA): 100% occlusion proximally.   Left ventriculography: Left ventricular systolic function is good, LVEF is estimated at 50-55%, there is mild inferior hypokinesis. There is no significant mitral regurgitation   PCI Procedure Note:  Following the diagnostic procedure, the decision was made to proceed with PCI. Brilinta 180 mg was given orally.  Weight-based bivalirudin was given for anticoagulation. Once a therapeutic ACT was achieved, a 6 [place] FR4 guide catheter was inserted. The RCA spontaneously reperfused.  A prowater coronary guidewire was used to cross the lesion.  The lesion was predilated with a 2.5 mm balloon. There was some distal embolization of thrombus but this resolved with repositioning wire  into the PLOM.  The lesion was then stented with a 3.5 x 20 mm Promus stent.  The stent was postdilated with a 3.5 mm noncompliant balloon.  Following PCI, there was 0% residual stenosis and TIMI-3 flow. Final angiography confirmed an excellent result. Femoral hemostasis was achieved with an Angioseal device.  The patient tolerated the PCI procedure well. There were no immediate procedural complications.  The patient was transferred to the post catheterization recovery area for further monitoring.  PCI Data: Vessel - RCA/Segment - proximal Percent Stenosis (pre)  100% TIMI-flow 0 Stent 3.5 x 20 mm Promus Percent Stenosis (post) 0% TIMI-flow (post) 3  Final Conclusions:   1. 2 vessel obstructive CAD. Culprit lesion is the proximal RCA occlusion.  2. Good LV function 3. Successful stenting of the proximal RCA with a DES.  Recommendations: DAPT for one year. Aggressive risk factor modification. Would consider PCI of the LAD in 2 days.   Peter Martinique, [place]  03/03/2014, 3:42 PM

## 2014-03-03 NOTE — Progress Notes (Signed)
eLink Physician-Brief Progress Note Patient Name: [name] DOB: 1965/04/23 MRN: [id number]   Date of Service  03/03/2014  HPI/Events of Note   Per protocol review of new patient arrival. Patient comfortable and tolerating Po   eICU Interventions   Monitor      Intervention Category Minor Interventions: Routine modifications to care plan (e.g. PRN medications for pain, fever)  [name] R. 03/03/2014, 6:01 PM

## 2014-03-03 NOTE — Progress Notes (Signed)
Pt HR 48-52. BP 88/51 (63) . Pt c/o slight SOB, but nothing compared to earlier. Denies CP. On called MD paged. Dr. Tommi Rumps suspects night time  Brilinta to be the cause. Will continue to monitor and assess pt's condition.

## 2014-03-04 DIAGNOSIS — I059 Rheumatic mitral valve disease, unspecified: Secondary | ICD-10-CM

## 2014-03-04 LAB — CBC
HCT: 41.9 % (ref 36.0–46.0)
Hemoglobin: 14.1 g/dL (ref 12.0–15.0)
MCH: 32.9 pg (ref 26.0–34.0)
MCHC: 33.7 g/dL (ref 30.0–36.0)
MCV: 97.7 fL (ref 78.0–100.0)
Platelets: 323 10*3/uL (ref 150–400)
RBC: 4.29 MIL/uL (ref 3.87–5.11)
RDW: 15.5 % (ref 11.5–15.5)
WBC: 14.5 10*3/uL — ABNORMAL HIGH (ref 4.0–10.5)

## 2014-03-04 LAB — BASIC METABOLIC PANEL
Anion gap: 13 (ref 5–15)
BUN: 12 mg/dL (ref 6–23)
CO2: 21 mEq/L (ref 19–32)
Calcium: 8.4 mg/dL (ref 8.4–10.5)
Chloride: 104 mEq/L (ref 96–112)
Creatinine, Ser: 0.47 mg/dL — ABNORMAL LOW (ref 0.50–1.10)
GFR calc Af Amer: >90 mL/min (ref >90)
GFR calc non Af Amer: >90 mL/min (ref >90)
Glucose, Bld: 181 mg/dL — ABNORMAL HIGH (ref 70–99)
Potassium: 3.6 mEq/L — ABNORMAL LOW (ref 3.7–5.3)
Sodium: 138 mEq/L (ref 137–147)

## 2014-03-04 LAB — HEMOGLOBIN A1C
Hgb A1c MFr Bld: 10.8 % — ABNORMAL HIGH (ref <5.7)
Hgb A1c MFr Bld: 11.2 % — ABNORMAL HIGH (ref <5.7)
Mean Plasma Glucose: 263 mg/dL — ABNORMAL HIGH (ref <117)
Mean Plasma Glucose: 275 mg/dL — ABNORMAL HIGH (ref <117)

## 2014-03-04 LAB — GLUCOSE, CAPILLARY
Glucose-Capillary: 229 mg/dL — ABNORMAL HIGH (ref 70–99)
Glucose-Capillary: 153 mg/dL — ABNORMAL HIGH (ref 70–99)
Glucose-Capillary: 217 mg/dL — ABNORMAL HIGH (ref 70–99)
Glucose-Capillary: 194 mg/dL — ABNORMAL HIGH (ref 70–99)

## 2014-03-04 LAB — HEPARIN LEVEL (UNFRACTIONATED): Heparin Unfractionated: <0.1 IU/mL — ABNORMAL LOW (ref 0.30–0.70)

## 2014-03-04 MED ORDER — SODIUM CHLORIDE 0.9 % IV BOLUS (SEPSIS): 250.0000 mL | INTRAVENOUS | Status: DC | PRN

## 2014-03-04 MED ORDER — HEPARIN BOLUS VIA INFUSION
2000.0000 [IU] | Freq: Once | INTRAVENOUS | Status: AC
  Administered 2014-03-04: 2000 [IU] via INTRAVENOUS
  Filled 2014-03-04: qty 2000

## 2014-03-04 MED ORDER — SODIUM CHLORIDE 0.9 % IJ SOLN
3.0000 mL | Freq: Two times a day (BID) | INTRAMUSCULAR | Status: DC
  Administered 2014-03-04 – 2014-03-05 (×3): 3 mL via INTRAVENOUS

## 2014-03-04 MED ORDER — SODIUM CHLORIDE 0.9 % IV SOLN
1.0000 mL/kg/h | INTRAVENOUS | Status: DC
  Administered 2014-03-05: 1 mL/kg/h via INTRAVENOUS

## 2014-03-04 MED ORDER — POTASSIUM CHLORIDE CRYS ER 20 MEQ PO TBCR
40.0000 meq | EXTENDED_RELEASE_TABLET | Freq: Once | ORAL | Status: AC
  Administered 2014-03-04: 40 meq via ORAL
  Filled 2014-03-04: qty 2

## 2014-03-04 MED ORDER — SODIUM CHLORIDE 0.9 % IJ SOLN: 3.0000 mL | INTRAMUSCULAR | Status: DC | PRN

## 2014-03-04 MED ORDER — SODIUM CHLORIDE 0.9 % IV BOLUS (SEPSIS)
1000.0000 mL | Freq: Once | INTRAVENOUS | Status: AC
  Administered 2014-03-04: 1000 mL via INTRAVENOUS

## 2014-03-04 MED ORDER — ALPRAZOLAM 0.5 MG PO TABS
1.0000 mg | ORAL_TABLET | Freq: Two times a day (BID) | ORAL | Status: DC | PRN
  Administered 2014-03-04 – 2014-03-05 (×3): 1 mg via ORAL
  Filled 2014-03-04 (×3): qty 2

## 2014-03-04 MED ORDER — ASPIRIN 81 MG PO CHEW
81.0000 mg | CHEWABLE_TABLET | ORAL | Status: AC
  Administered 2014-03-05: 81 mg via ORAL
  Filled 2014-03-04: qty 1

## 2014-03-04 MED ORDER — SODIUM CHLORIDE 0.9 % IV SOLN: 250.0000 mL | INTRAVENOUS | Status: DC | PRN

## 2014-03-04 MED ORDER — HEPARIN (PORCINE) IN NACL 100-0.45 UNIT/ML-% IJ SOLN
1300.0000 [IU]/h | INTRAMUSCULAR | Status: DC
  Administered 2014-03-04: 800 [IU]/h via INTRAVENOUS
  Administered 2014-03-05: 1300 [IU]/h via INTRAVENOUS
  Filled 2014-03-04 (×4): qty 250

## 2014-03-04 NOTE — Progress Notes (Signed)
Pt experienced four second pause on telemetry monitor. Returned to sinus brady. Pt said she felt some palpitations and SOB during that time. Denies CP. On call MD paged. Dr. Tommi Rumps gave verbal orders for a 500cc bolus for soft BP, and encouraged to have atropine near by. Pt current VS: HR 52, BP 90/57 (68), RR 18, O2 100% on 2L [place]. Will continue to monitor and assess pt.

## 2014-03-04 NOTE — Progress Notes (Signed)
  DAILY PROGRESS NOTE  Subjective:  Hypotension and bradycardia overnight. Given 1.5L with little effect, suspect RV involvement of inferior STEMI - noted to have bradycardia and hypotension after receiving nitroglycerin by EMS enroute during STEMI.  Echo ordered, but not yet performed.  Objective:  Temp:  [98.2 F (36.8 C)-98.8 F (37.1 C)] 98.5 F (36.9 C) (11/15 0400) Pulse Rate:  [47-80] 52 (11/15 0700) Resp:  [12-29] 21 (11/15 0700) BP: (80-135)/(31-87) 80/31 mmHg (11/15 0700) SpO2:  [94 %-100 %] 97 % (11/15 0700) Weight:  [162 lb (73.483 kg)] 162 lb (73.483 kg) (11/14 1435) Weight change:   Intake/Output from previous day: 11/14 0701 - 11/15 0700 In: 1153.5 [P.O.:360; I.V.:793.5] Out: 675 [Urine:675]  Intake/Output from this shift:    Medications: Current Facility-Administered Medications  Medication Dose Route Frequency Provider Last Rate Last Dose  . 0.9 %  sodium chloride infusion   Intravenous Continuous Brian D Miller, MD 10 mL/hr at 03/04/14 0500 10 mL/hr at 03/04/14 0500  . acetaminophen (TYLENOL) tablet 650 mg  650 mg Oral Q4H PRN Peter M Jordan, MD      . ALPRAZolam (XANAX) tablet 1 mg  1 mg Oral BID PRN Daniel Friedman, MD   1 mg at 03/04/14 0354  . aspirin chewable tablet 81 mg  81 mg Oral Daily Peter M Jordan, MD   81 mg at 03/03/14 1808  . atorvastatin (LIPITOR) tablet 80 mg  80 mg Oral q1800 Peter M Jordan, MD   80 mg at 03/03/14 1833  . heparin injection 5,000 Units  5,000 Units Subcutaneous 3 times per day Peter M Jordan, MD   5,000 Units at 03/04/14 0535  . Influenza vac split quadrivalent PF (FLUARIX) injection 0.5 mL  0.5 mL Intramuscular Tomorrow-1000 Peter M Jordan, MD      . insulin aspart (novoLOG) injection 0-15 Units  0-15 Units Subcutaneous TID WC  C. , MD      . insulin aspart (novoLOG) injection 0-5 Units  0-5 Units Subcutaneous QHS  C. , MD   0 Units at 03/03/14 2135  . insulin aspart protamine- aspart (NOVOLOG MIX  70/30) injection 50 Units  50 Units Subcutaneous BID WC  C. , MD   50 Units at 03/03/14 1833  . ondansetron (ZOFRAN) injection 4 mg  4 mg Intravenous Q6H PRN Peter M Jordan, MD      . ticagrelor (BRILINTA) tablet 90 mg  90 mg Oral BID Peter M Jordan, MD   90 mg at 03/03/14 2135    Physical Exam: General appearance: alert and no distress Neck: no carotid bruit and no JVD Lungs: clear to auscultation bilaterally Heart: regular rate and rhythm, S1, S2 normal, no murmur, click, rub or gallop Abdomen: soft, non-tender; bowel sounds normal; no masses,  no organomegaly Extremities: extremities normal, atraumatic, no cyanosis or edema Pulses: 2+ and symmetric Skin: Skin color, texture, turgor normal. No rashes or lesions Neurologic: Grossly normal Psych: Normal  Lab Results: Results for orders placed or performed during the hospital encounter of 03/03/14 (from the past 48 hour(s))  APTT     Status: None   Collection Time: 03/03/14  2:32 PM  Result Value Ref Range   aPTT 28 24 - 37 seconds  CBC     Status: Abnormal   Collection Time: 03/03/14  2:32 PM  Result Value Ref Range   WBC 16.2 (H) 4.0 - 10.5 K/uL   RBC 4.62 3.87 - 5.11 MIL/uL   Hemoglobin 15.3 (H) 12.0 - 15.0   g/dL   HCT 43.4 36.0 - 46.0 %   MCV 93.9 78.0 - 100.0 fL   MCH 33.1 26.0 - 34.0 pg   MCHC 35.3 30.0 - 36.0 g/dL   RDW 15.1 11.5 - 15.5 %   Platelets 373 150 - 400 K/uL  Comprehensive metabolic panel     Status: Abnormal   Collection Time: 03/03/14  2:32 PM  Result Value Ref Range   Sodium 139 137 - 147 mEq/L   Potassium 3.7 3.7 - 5.3 mEq/L   Chloride 101 96 - 112 mEq/L   CO2 22 19 - 32 mEq/L   Glucose, Bld 255 (H) 70 - 99 mg/dL   BUN 13 6 - 23 mg/dL   Creatinine, Ser 0.52 0.50 - 1.10 mg/dL   Calcium 9.1 8.4 - 10.5 mg/dL   Total Protein 7.3 6.0 - 8.3 g/dL   Albumin 3.4 (L) 3.5 - 5.2 g/dL   AST 18 0 - 37 U/L   ALT 21 0 - 35 U/L   Alkaline Phosphatase 105 39 - 117 U/L   Total Bilirubin 0.3 0.3 - 1.2  mg/dL   GFR calc non Af Amer >90 >90 mL/min   GFR calc Af Amer >90 >90 mL/min    Comment: (NOTE) The eGFR has been calculated using the CKD EPI equation. This calculation has not been validated in all clinical situations. eGFR's persistently <90 mL/min signify possible Chronic Kidney Disease.    Anion gap 16 (H) 5 - 15  Protime-INR     Status: None   Collection Time: 03/03/14  2:32 PM  Result Value Ref Range   Prothrombin Time 13.3 11.6 - 15.2 seconds   INR 1.00 0.00 - 1.49  I-Stat Troponin, ED (not at [facility])     Status: None   Collection Time: 03/03/14  2:39 PM  Result Value Ref Range   Troponin i, poc 0.05 0.00 - 0.08 ng/mL   Comment 3            Comment: Due to the release kinetics of cTnI, a negative result within the first hours of the onset of symptoms does not rule out myocardial infarction with certainty. If myocardial infarction is still suspected, repeat the test at appropriate intervals.   CBC     Status: Abnormal   Collection Time: 03/03/14  4:00 PM  Result Value Ref Range   WBC 14.7 (H) 4.0 - 10.5 K/uL   RBC 4.38 3.87 - 5.11 MIL/uL   Hemoglobin 14.6 12.0 - 15.0 g/dL   HCT 42.4 36.0 - 46.0 %   MCV 96.8 78.0 - 100.0 fL   MCH 33.3 26.0 - 34.0 pg   MCHC 34.4 30.0 - 36.0 g/dL   RDW 15.3 11.5 - 15.5 %   Platelets 311 150 - 400 K/uL  Comprehensive metabolic panel     Status: Abnormal   Collection Time: 03/03/14  4:00 PM  Result Value Ref Range   Sodium 140 137 - 147 mEq/L   Potassium 3.4 (L) 3.7 - 5.3 mEq/L   Chloride 102 96 - 112 mEq/L   CO2 23 19 - 32 mEq/L   Glucose, Bld 228 (H) 70 - 99 mg/dL   BUN 13 6 - 23 mg/dL   Creatinine, Ser 0.51 0.50 - 1.10 mg/dL   Calcium 8.9 8.4 - 10.5 mg/dL   Total Protein 7.0 6.0 - 8.3 g/dL   Albumin 3.2 (L) 3.5 - 5.2 g/dL   AST 16 0 - 37 U/L   ALT 19 0 -   35 U/L   Alkaline Phosphatase 104 39 - 117 U/L   Total Bilirubin 0.3 0.3 - 1.2 mg/dL   GFR calc non Af Amer >90 >90 mL/min   GFR calc Af Amer >90 >90 mL/min    Comment:  (NOTE) The eGFR has been calculated using the CKD EPI equation. This calculation has not been validated in all clinical situations. eGFR's persistently <90 mL/min signify possible Chronic Kidney Disease.    Anion gap 15 5 - 15  Hemoglobin A1c     Status: Abnormal   Collection Time: 03/03/14  4:00 PM  Result Value Ref Range   Hgb A1c MFr Bld 10.8 (H) <5.7 %    Comment: (NOTE)                                                                       According to the ADA Clinical Practice Recommendations for 2011, when HbA1c is used as a screening test:  >=6.5%   Diagnostic of Diabetes Mellitus           (if abnormal result is confirmed) 5.7-6.4%   Increased risk of developing Diabetes Mellitus References:Diagnosis and Classification of Diabetes Mellitus,Diabetes Care,2011,34(Suppl 1):S62-S69 and Standards of Medical Care in         Diabetes - 2011,Diabetes Care,2011,34 (Suppl 1):S11-S61.    Mean Plasma Glucose 263 (H) <117 mg/dL    Comment: Performed at [facility]  Lipid panel     Status: Abnormal   Collection Time: 03/03/14  4:00 PM  Result Value Ref Range   Cholesterol 259 (H) 0 - 200 mg/dL   Triglycerides 114 <150 mg/dL   HDL 38 (L) >39 mg/dL   Total CHOL/HDL Ratio 6.8 RATIO   VLDL 23 0 - 40 mg/dL   LDL Cholesterol 198 (H) 0 - 99 mg/dL    Comment:        Total Cholesterol/HDL:CHD Risk Coronary Heart Disease Risk Table                     Men   Women  1/2 Average Risk   3.4   3.3  Average Risk       5.0   4.4  2 X Average Risk   9.6   7.1  3 X Average Risk  23.4   11.0        Use the calculated Patient Ratio above and the CHD Risk Table to determine the patient's CHD Risk.        ATP III CLASSIFICATION (LDL):  <100     mg/dL   Optimal  100-129  mg/dL   Near or Above                    Optimal  130-159  mg/dL   Borderline  160-189  mg/dL   High  >190     mg/dL   Very High   CK total and CKMB (cardiac)     Status: None   Collection Time: 03/03/14  4:00 PM    Result Value Ref Range   Total CK 124 7 - 177 U/L   CK, MB 2.2 0.3 - 4.0 ng/mL   Relative Index 1.8 0.0 - 2.5  Troponin I       Status: None   Collection Time: 03/03/14  4:00 PM  Result Value Ref Range   Troponin I <0.30 <0.30 ng/mL    Comment:        Due to the release kinetics of cTnI, a negative result within the first hours of the onset of symptoms does not rule out myocardial infarction with certainty. If myocardial infarction is still suspected, repeat the test at appropriate intervals.   MRSA PCR Screening     Status: None   Collection Time: 03/03/14  4:09 PM  Result Value Ref Range   MRSA by PCR NEGATIVE NEGATIVE    Comment:        The GeneXpert MRSA Assay (FDA approved for NASAL specimens only), is one component of a comprehensive MRSA colonization surveillance program. It is not intended to diagnose MRSA infection nor to guide or monitor treatment for MRSA infections.   Glucose, capillary     Status: Abnormal   Collection Time: 03/03/14  4:46 PM  Result Value Ref Range   Glucose-Capillary 218 (H) 70 - 99 mg/dL  Glucose, capillary     Status: Abnormal   Collection Time: 03/03/14  9:27 PM  Result Value Ref Range   Glucose-Capillary 185 (H) 70 - 99 mg/dL   Comment 1 Capillary Sample   Basic metabolic panel     Status: Abnormal   Collection Time: 03/04/14  2:45 AM  Result Value Ref Range   Sodium 138 137 - 147 mEq/L   Potassium 3.6 (L) 3.7 - 5.3 mEq/L   Chloride 104 96 - 112 mEq/L   CO2 21 19 - 32 mEq/L   Glucose, Bld 181 (H) 70 - 99 mg/dL   BUN 12 6 - 23 mg/dL   Creatinine, Ser 0.47 (L) 0.50 - 1.10 mg/dL   Calcium 8.4 8.4 - 10.5 mg/dL   GFR calc non Af Amer >90 >90 mL/min   GFR calc Af Amer >90 >90 mL/min    Comment: (NOTE) The eGFR has been calculated using the CKD EPI equation. This calculation has not been validated in all clinical situations. eGFR's persistently <90 mL/min signify possible Chronic Kidney Disease.    Anion gap 13 5 - 15  CBC      Status: Abnormal   Collection Time: 03/04/14  2:45 AM  Result Value Ref Range   WBC 14.5 (H) 4.0 - 10.5 K/uL   RBC 4.29 3.87 - 5.11 MIL/uL   Hemoglobin 14.1 12.0 - 15.0 g/dL   HCT 41.9 36.0 - 46.0 %   MCV 97.7 78.0 - 100.0 fL   MCH 32.9 26.0 - 34.0 pg   MCHC 33.7 30.0 - 36.0 g/dL   RDW 15.5 11.5 - 15.5 %   Platelets 323 150 - 400 K/uL    Imaging: No results found.  Assessment:  1. Principal Problem: 2.   ST elevation myocardial infarction (STEMI) involving other coronary artery of inferior wall 3. Active Problems: 4.   Hypertension 5.   Hyperlipidemia 6.   Diabetes type 2, uncontrolled 7.   Nonalcoholic fatty liver disease 8.   Fibromyalgia 9.   ST elevation myocardial infarction (STEMI) involving right coronary artery with complication 10.   Plan:  1. Hypotension and bradycardia overnight, suspect RV involvement. Responding to fluids. No pressors at this time. Continue IVF bolus for SBP< 80. Restart heparin due to non-revascularized lesion in LAD. Plan for LHC and PCI to LAD tomorrow.  Time Spent Directly with Patient:  15 minutes  Length of Stay:  LOS: 1   day    C. , MD, FACC Attending Cardiologist [facility]  , C 03/04/2014, 7:51 AM     

## 2014-03-04 NOTE — Progress Notes (Signed)
Pt's SBP remaining in the 80's. On call MD paged. Verbal orders given by Dr. Tommi Rumps to give 1L Bolus.

## 2014-03-04 NOTE — Progress Notes (Signed)
Pt had difficulty swallowing a piece of meat while eating lunch, HR dropped into 30s and immediately returned to 50s. Pt is asymptomatic. Will continue to monitor.

## 2014-03-04 NOTE — Progress Notes (Signed)
Echo Lab  2D Echocardiogram completed.  [place], RDCS 03/04/2014 12:11 PM

## 2014-03-04 NOTE — Progress Notes (Addendum)
ANTICOAGULATION CONSULT NOTE - Initial Consult  Pharmacy Consult for Heparin Indication: chest pain/ACS  Allergies  Allergen Reactions  . Aspartame And Phenylalanine Shortness Of Breath and Swelling    Throat swells  ANY ARTIFICAL SWEETNERS  . Toradol [Ketorolac Tromethamine] Other (See Comments)    Face & neck flushed red & pt felt very hot after IV adm.  . Sucralose Rash    Pt.s face broke out in rash after drinking Breeza.  Pt. Claims it is from the artificial sweetener.     . Dilaudid [Hydromorphone Hcl]     Itch  . Hydrocodone Itching  . Oxycodone Rash    itching    Patient Measurements: Height: 5\' 2"  (157.5 cm) Weight: 161 lb 13.1 oz (73.4 kg) IBW/kg (Calculated) : 50.1 Heparin Dosing Weight: 65.8 kg  Vital Signs: Temp: 98.1 F (36.7 C) (11/15 0800) Temp Source: Oral (11/15 0800) BP: 105/54 mmHg (11/15 0800) Pulse Rate: 58 (11/15 0800)  Labs:  Recent Labs  03/03/14 1432 03/03/14 1600 03/04/14 0245  HGB 15.3* 14.6 14.1  HCT 43.4 42.4 41.9  PLT 373 311 323  APTT 28  --   --   LABPROT 13.3  --   --   INR 1.00  --   --   CREATININE 0.52 0.51 0.47*  CKTOTAL  --  124  --   CKMB  --  2.2  --   TROPONINI  --  <0.30  --     Estimated Creatinine Clearance: 79.8 mL/min (by C-G formula based on Cr of 0.47).   Medical History: Past Medical History  Diagnosis Date  . Diabetes mellitus   . Hypertension   . High cholesterol   . Clostridium difficile colitis   . Sea-blue histiocyte syndrome   . Leukocytosis     Dr Tressie Stalker  . Fibromyalgia   . Neuropathy, peripheral   . Upper respiratory infection 2/15    states has dry non productive cough since then- "tickle in my throat" no fever  . Anxiety   . GERD (gastroesophageal reflux disease)   . H/O hiatal hernia     Medications:  Scheduled:  . aspirin  81 mg Oral Daily  . [START ON 03/05/2014] aspirin  81 mg Oral Pre-Cath  . atorvastatin  80 mg Oral q1800  . Influenza vac split quadrivalent PF  0.5 mL  Intramuscular Tomorrow-1000  . insulin aspart  0-15 Units Subcutaneous TID WC  . insulin aspart  0-5 Units Subcutaneous QHS  . insulin aspart protamine- aspart  50 Units Subcutaneous BID WC  . sodium chloride  3 mL Intravenous Q12H  . ticagrelor  90 mg Oral BID   Infusions:  . sodium chloride 10 mL/hr (03/04/14 0500)  . [START ON 03/05/2014] sodium chloride      Assessment: 48 yo F admitted on 11/14 as code STEMI. LHC revealed severe 2 vessel CAD. Successful DES to RCA with plans for PCI of LAD on Monday to begin heparin gtt in the meantime. CBC remains wnl and stable. SCr is stable at 0.47 with estimated CrCl ~80 ml/min.   Goal of Therapy:  Heparin level 0.3-0.7 units/ml Monitor platelets by anticoagulation protocol: Yes   Plan:  - Initiate heparin gtt at 800 u/hr (no bolus with recent SQ hep) - HL in 6 hours - Daily HL/CBC - Monitor for s/s bleeding and f/u cath plans for Monday  Harolyn Rutherford, PharmD Clinical Pharmacist - Resident Pager: [phone] Pharmacy: [phone] 03/04/2014 8:58 AM   Addendum:  Initial  heparin level on 800 units/hr was undetectable. No IV or bleeding issues noted.  Plan Give 2000 unit heparin bolus then increase rate to 1100 units/hr Recheck heparin level in 6 hours. Plan for cath tomorrow  Erin Hearing PharmD., BCPS Clinical Pharmacist Pager [phone] 03/04/2014 5:32 PM

## 2014-03-05 ENCOUNTER — Encounter (HOSPITAL_COMMUNITY)
Admission: EM | Disposition: A | Discharge status: Home / Self Care | Payer: Self-pay | Source: Home / Self Care | Attending: Cardiology

## 2014-03-05 DIAGNOSIS — R001 Bradycardia, unspecified: Secondary | ICD-10-CM

## 2014-03-05 DIAGNOSIS — E876 Hypokalemia: Secondary | ICD-10-CM

## 2014-03-05 DIAGNOSIS — I10 Essential (primary) hypertension: Secondary | ICD-10-CM | POA: Insufficient documentation

## 2014-03-05 DIAGNOSIS — R0601 Orthopnea: Secondary | ICD-10-CM

## 2014-03-05 DIAGNOSIS — I213 ST elevation (STEMI) myocardial infarction of unspecified site: Secondary | ICD-10-CM

## 2014-03-05 HISTORY — PX: CARDIAC CATHETERIZATION: SHX172

## 2014-03-05 HISTORY — PX: PERCUTANEOUS CORONARY STENT INTERVENTION (PCI-S): SHX5485

## 2014-03-05 LAB — GLUCOSE, CAPILLARY
Glucose-Capillary: 214 mg/dL — ABNORMAL HIGH (ref 70–99)
Glucose-Capillary: 130 mg/dL — ABNORMAL HIGH (ref 70–99)
Glucose-Capillary: 112 mg/dL — ABNORMAL HIGH (ref 70–99)
Glucose-Capillary: 128 mg/dL — ABNORMAL HIGH (ref 70–99)
Glucose-Capillary: 145 mg/dL — ABNORMAL HIGH (ref 70–99)

## 2014-03-05 LAB — CBC
HCT: 41.4 % (ref 36.0–46.0)
Hemoglobin: 13.9 g/dL (ref 12.0–15.0)
MCH: 32.9 pg (ref 26.0–34.0)
MCHC: 33.6 g/dL (ref 30.0–36.0)
MCV: 97.9 fL (ref 78.0–100.0)
Platelets: 308 10*3/uL (ref 150–400)
RBC: 4.23 MIL/uL (ref 3.87–5.11)
RDW: 15.5 % (ref 11.5–15.5)
WBC: 14.4 10*3/uL — ABNORMAL HIGH (ref 4.0–10.5)

## 2014-03-05 LAB — HEPARIN LEVEL (UNFRACTIONATED)
Heparin Unfractionated: 0.17 IU/mL — ABNORMAL LOW (ref 0.30–0.70)
Heparin Unfractionated: 0.32 IU/mL (ref 0.30–0.70)

## 2014-03-05 LAB — POCT ACTIVATED CLOTTING TIME: Activated Clotting Time: 135 seconds

## 2014-03-05 SURGERY — PERCUTANEOUS CORONARY STENT INTERVENTION (PCI-S)
Anesthesia: LOCAL

## 2014-03-05 MED ORDER — LIDOCAINE HCL (PF) 1 % IJ SOLN
INTRAMUSCULAR | Status: AC
  Filled 2014-03-05: qty 30

## 2014-03-05 MED ORDER — ONDANSETRON HCL 4 MG/2ML IJ SOLN: 4.0000 mg | Freq: Four times a day (QID) | INTRAMUSCULAR | Status: DC | PRN

## 2014-03-05 MED ORDER — SODIUM CHLORIDE 0.9 % IV SOLN
INTRAVENOUS | Status: DC
  Administered 2014-03-05: 09:00:00 via INTRAVENOUS

## 2014-03-05 MED ORDER — ACETAMINOPHEN 325 MG PO TABS
650.0000 mg | ORAL_TABLET | ORAL | Status: DC | PRN
  Administered 2014-03-05: 17:00:00 650 mg via ORAL
  Filled 2014-03-05: qty 2

## 2014-03-05 MED ORDER — TICAGRELOR 90 MG PO TABS
90.0000 mg | ORAL_TABLET | Freq: Two times a day (BID) | ORAL | Status: DC
  Administered 2014-03-05: 22:00:00 90 mg via ORAL
  Filled 2014-03-05 (×3): qty 1

## 2014-03-05 MED ORDER — FUROSEMIDE 10 MG/ML IJ SOLN
20.0000 mg | Freq: Once | INTRAMUSCULAR | Status: AC
  Administered 2014-03-05: 20 mg via INTRAVENOUS
  Filled 2014-03-05: qty 2

## 2014-03-05 MED ORDER — POTASSIUM CHLORIDE CRYS ER 20 MEQ PO TBCR
40.0000 meq | EXTENDED_RELEASE_TABLET | ORAL | Status: AC
  Administered 2014-03-05 (×2): 40 meq via ORAL
  Filled 2014-03-05 (×2): qty 2

## 2014-03-05 MED ORDER — HEPARIN (PORCINE) IN NACL 2-0.9 UNIT/ML-% IJ SOLN
INTRAMUSCULAR | Status: AC
  Filled 2014-03-05: qty 1500

## 2014-03-05 MED ORDER — NITROGLYCERIN 1 MG/10 ML FOR IR/CATH LAB
INTRA_ARTERIAL | Status: AC
  Filled 2014-03-05: qty 10

## 2014-03-05 MED ORDER — TRAMADOL HCL 50 MG PO TABS
50.0000 mg | ORAL_TABLET | Freq: Four times a day (QID) | ORAL | Status: DC | PRN
Start: 2014-03-05 — End: 2014-03-06
  Administered 2014-03-05 – 2014-03-06 (×2): 50 mg via ORAL
  Filled 2014-03-05 (×2): qty 1

## 2014-03-05 MED ORDER — HEPARIN BOLUS VIA INFUSION
2000.0000 [IU] | Freq: Once | INTRAVENOUS | Status: AC
  Administered 2014-03-05: 2000 [IU] via INTRAVENOUS
  Filled 2014-03-05: qty 2000

## 2014-03-05 MED ORDER — MIDAZOLAM HCL 2 MG/2ML IJ SOLN
INTRAMUSCULAR | Status: AC
  Filled 2014-03-05: qty 2

## 2014-03-05 MED ORDER — BIVALIRUDIN 250 MG IV SOLR
INTRAVENOUS | Status: AC
  Filled 2014-03-05: qty 250

## 2014-03-05 MED ORDER — SODIUM CHLORIDE 0.9 % IV SOLN
INTRAVENOUS | Status: DC
  Administered 2014-03-05: 16:00:00 via INTRAVENOUS

## 2014-03-05 MED ORDER — FENTANYL CITRATE 0.05 MG/ML IJ SOLN
INTRAMUSCULAR | Status: AC
  Filled 2014-03-05: qty 2

## 2014-03-05 MED FILL — Sodium Chloride IV Soln 0.9%: INTRAVENOUS | Qty: 50 | Status: AC

## 2014-03-05 NOTE — Progress Notes (Signed)
[place] for Heparin Indication: chest pain/ACS/STEMI  Allergies  Allergen Reactions  . Aspartame And Phenylalanine Shortness Of Breath and Swelling    Throat swells  ANY ARTIFICAL SWEETNERS  . Toradol [Ketorolac Tromethamine] Other (See Comments)    Face & neck flushed red & pt felt very hot after IV adm.  . Sucralose Rash    Pt.s face broke out in rash after drinking Breeza.  Pt. Claims it is from the artificial sweetener.     . Dilaudid [Hydromorphone Hcl]     Itch  . Hydrocodone Itching  . Oxycodone Rash    itching    Patient Measurements: Height: 5\' 2"  (157.5 cm) Weight: 184 lb 15.5 oz (83.9 kg) IBW/kg (Calculated) : 50.1 Heparin Dosing Weight: 65.8 kg  Vital Signs: Temp: 98.3 F (36.8 C) (11/16 0700) Temp Source: Oral (11/16 0700) BP: 94/75 mmHg (11/16 1000) Pulse Rate: 67 (11/16 1000)  Labs:  Recent Labs  03/03/14 1432 03/03/14 1600 03/04/14 0245 03/04/14 1528 03/05/14 03/05/14 0747  HGB 15.3* 14.6 14.1  --   --  13.9  HCT 43.4 42.4 41.9  --   --  41.4  PLT 373 311 323  --   --  308  APTT 28  --   --   --   --   --   LABPROT 13.3  --   --   --   --   --   INR 1.00  --   --   --   --   --   HEPARINUNFRC  --   --   --  <0.10* 0.17* 0.32  CREATININE 0.52 0.51 0.47*  --   --   --   CKTOTAL  --  124  --   --   --   --   CKMB  --  2.2  --   --   --   --   TROPONINI  --  <0.30  --   --   --   --     Estimated Creatinine Clearance: 85.4 mL/min (by C-G formula based on Cr of 0.47).  Assessment: 75 YOF with STEMI s/p RCA PCI, DES stent 11/14. Awaiting possible LAD PCI today. Pharmacy consulted for heparin. H/H and Plt WNL, no signs of bleeding. HL at 8:00 was therapeutic at 0.32.   Goal of Therapy:  Heparin level 0.3-0.7 units/ml Monitor platelets by anticoagulation protocol: Yes   Plan:  -Continue heparin 1300 units/hr   -Fu after cath  Gloriajean Dell, PharmD Candidate

## 2014-03-05 NOTE — Progress Notes (Signed)
[place] for Heparin Indication: chest pain/ACS  Allergies  Allergen Reactions  . Aspartame And Phenylalanine Shortness Of Breath and Swelling    Throat swells  ANY ARTIFICAL SWEETNERS  . Toradol [Ketorolac Tromethamine] Other (See Comments)    Face & neck flushed red & pt felt very hot after IV adm.  . Sucralose Rash    Pt.s face broke out in rash after drinking Breeza.  Pt. Claims it is from the artificial sweetener.     . Dilaudid [Hydromorphone Hcl]     Itch  . Hydrocodone Itching  . Oxycodone Rash    itching    Patient Measurements: Height: 5\' 2"  (157.5 cm) Weight: 184 lb 15.5 oz (83.9 kg) IBW/kg (Calculated) : 50.1 Heparin Dosing Weight: 65.8 kg  Vital Signs: Temp: 98.3 F (36.8 C) (11/15 2333) Temp Source: Oral (11/15 2333) BP: 118/64 mmHg (11/16 0000) Pulse Rate: 58 (11/16 0025)  Labs:  Recent Labs  03/03/14 1432 03/03/14 1600 03/04/14 0245 03/04/14 1528 03/05/14  HGB 15.3* 14.6 14.1  --   --   HCT 43.4 42.4 41.9  --   --   PLT 373 311 323  --   --   APTT 28  --   --   --   --   LABPROT 13.3  --   --   --   --   INR 1.00  --   --   --   --   HEPARINUNFRC  --   --   --  <0.10* 0.17*  CREATININE 0.52 0.51 0.47*  --   --   CKTOTAL  --  124  --   --   --   CKMB  --  2.2  --   --   --   TROPONINI  --  <0.30  --   --   --     Estimated Creatinine Clearance: 85.4 mL/min (by C-G formula based on Cr of 0.47).  Assessment: 48 yo female with STEMI s/p PCI 11/14, awaiting possible PCI today, for heparin  Goal of Therapy:  Heparin level 0.3-0.7 units/ml Monitor platelets by anticoagulation protocol: Yes   Plan:  Heparin 2000 units IV bolus, then increase heparin 1300 units/hr   Follow-up am labs.  Phillis Knack, PharmD, BCPS

## 2014-03-05 NOTE — Interval H&P Note (Signed)
Cath Lab Visit (complete for each Cath Lab visit)  Clinical Evaluation Leading to the Procedure:   ACS: Yes.    Non-ACS:    Anginal Classification: CCS IV  Anti-ischemic medical therapy: Maximal Therapy (2 or more classes of medications)  Non-Invasive Test Results: No non-invasive testing performed  Prior CABG: No previous CABG      History and Physical Interval Note:  03/05/2014 2:58 PM  [name]  has presented today for surgery, with the diagnosis of cad  The various methods of treatment have been discussed with the patient and family. After consideration of risks, benefits and other options for treatment, the patient has consented to  Procedure(s): PERCUTANEOUS CORONARY STENT INTERVENTION (PCI-S) (N/A) as a surgical intervention .  The patient's history has been reviewed, patient examined, no change in status, stable for surgery.  I have reviewed the patient's chart and labs.  Questions were answered to the patient's satisfaction.     Marrianne Sica A

## 2014-03-05 NOTE — Care Management Note (Signed)
    Page 1 of 1   03/05/2014     9:58:36 AM CARE MANAGEMENT NOTE 03/05/2014  Patient:  [name], [name]   Account Number:  [id number]  Date Initiated:  03/05/2014  Documentation initiated by:  [name]  Subjective/Objective Assessment:   adm w mi     Action/Plan:   lives alone, pcp dr [name]   Anticipated DC Date:     Anticipated DC Plan:        [name]  CM consult  Medication Assistance      Choice offered to / List presented to:             Status of service:   Medicare Important Message given?   (If response is "NO", the following Medicare IM given date fields will be blank) Date Medicare IM given:   Medicare IM given by:   Date Additional Medicare IM given:   Additional Medicare IM given by:    Discharge Disposition:    Per UR Regulation:  Reviewed for med. necessity/level of care/duration of stay  If discussed at [place] of Stay Meetings, dates discussed:    Comments:  11/16 0956 Patricia Efe Fazzino rn,bsn gave pt brilinta 30day free card. no ins but uses [name] and they assist w meds. placed pt assist form on shadow chart for md to sign.

## 2014-03-05 NOTE — CV Procedure (Signed)
[name] is a 48 y.o. female    [id number]  [id number] LOCATION:  FACILITY: [place]  PHYSICIAN: Troy Sine, MD, [facility] [birth year]   DATE OF PROCEDURE:  03/05/2014    CARDIAC CATHETERIZATION     HISTORY:    [name] is a 48 y.o. female he presented with an ST segment elevation inferior wall myocardial infarction on 03/03/2014.  She underwent successful acute PCI to a totally occluded RCA with insertion of a ES 3.520 mm Promus stent.  At the time.  She also had an ulcerated plaque with 70-80% stenosis in the mid LAD.  She is brought back to the catheterization laboratory today to undergo intervention to her LAD lesion.   PROCEDURE:  Left heart catheterization: coronary angiography, left ventriculography   The patient was brought to the [facility] cardiac catherization laboratory in the fasting state with plan to perform a percutaneous coronary intervention to the previously documented ulcerated plaque in the mid LAD. She was premedicated with Versed 2 mg and fentanyl 50 g. Her right groin was prepped and shaved in usual sterile fashion. Xylocaine 1% was used for local anesthesia. A 6 French sheath was inserted into the R femoral artery.  Initial diagnostic catheterization was performed utilizing a 5 [place] JR4 diagnostic catheter.  With the demonstration of a widely patent RCA.  Following acute ST segment elevation MI stenting 2 days previously, the decision was made to proceed with the planned procedure to the LAD.  A 6 French XB LAD 3.5 guide was then inserted.  Angiograms were performed in multiple views and demonstrated significant improvement in the previous ulcerated lesion with resolution of prior thrombus and with only a residual narrowing of 20% at the site of previous mid LAD significant stenosis.  The plan to perform PCI was then aborted.  Angiomax was not administered.  A 5 French pigtail catheter was then inserted for RAO ventriculography.  Hemostasis was obtained  by direct manual pressure.  The patient tolerated the procedure well.  HEMODYNAMICS:   Central Aorta: 102/62   Left Ventricle: 102/19  ANGIOGRAPHY:  Left main:  A long angiographically normal vessel which bifurcated into the LAD and left circumflex vessel.   LAD:  A moderate size vessel that gave rise to a proximal diagonal vessel and several septal perforating arteries.  Previously in the mid LAD.  There was an ulcerated plaque with thrombus, which was felt to have significant stenosis.  Today, however, the thrombus had entirely resolved and there was a residual stenosis of 20 to less than 30% at this site.  There was no evidence for dissection.  There was brisk TIMI-3 flow.  Ramus Intermediate  Left circumflex:  Angiographically normal gave rise to one major marginal branch..   Right coronary artery:  Large dominant vessel with widely patent stent at the site of initial total occlusion from 2 days previously in the proximal to mid RCA without evidence for restenosis.  Left ventriculography revealed global ejection fraction approximately 50%.  There was residual moderate mid to mid basal inferior hypocontractility.   IMPRESSION:  Mild LV dysfunction with mild-to-moderate mid-to mid, basal inferior hypocontractility and an ejection fraction of approximately 50% in this patient 2 days status post ST segment elevation myocardial infarction secondary to total occlusion of the proximal RCA.  Significantly improved stenosis of the mid LAD with resolution of prior thrombus and residual narrowing of 20 to less than 30%.  Normal left circumflex coronary artery.  RECOMMENDATION:  Medical therapy with  continued aggressive lipid-lowering therapy, statin, ACE-I/ARB, beta blocker post MI.  Smoking cessation is imperative.  The patient will be maintained on dual antiplatelet therapy for minimum of 1 year.  Troy Sine, MD, [facility] 03/05/2014 3:57 PM

## 2014-03-05 NOTE — Progress Notes (Addendum)
Site area: rt groin Site Prior to Removal:  Level 0 Pressure Applied For: 30 minutes Manual:   Yes, sheath pulled by CAnderson,RN Patient Status During Pull: stable   Post Pull Site:  Level 0 Post Pull Instructions Given:  yes Post Pull Pulses Present: yes Dressing Applied:  tegaderm Bedrest begins @ 1615 Persistent capillary ooz 10 min post arterial hemostasis was obtained

## 2014-03-05 NOTE — Plan of Care (Signed)
Problem: Consults Goal: Diabetes Guidelines if Diabetic/Glucose > 140 If diabetic or lab glucose is > 140 mg/dl - Initiate Diabetes/Hyperglycemia Guidelines & Document Interventions  Outcome: Completed/Met Date Met:  03/05/14  Problem: Phase I Progression Outcomes Goal: Anginal pain relieved Outcome: Completed/Met Date Met:  03/05/14 Goal: Aspirin unless contraindicated Outcome: Completed/Met Date Met:  03/05/14 Goal: Voiding-avoid urinary catheter unless indicated Outcome: Completed/Met Date Met:  03/05/14 Goal: Vascular site scale level 0 - I Vascular Site Scale Level 0: No bruising/bleeding/hematoma Level I (Mild): Bruising/Ecchymosis, minimal bleeding/ooozing, palpable hematoma < 3 cm Level II (Moderate): Bleeding not affecting hemodynamic parameters, pseudoaneurysm, palpable hematoma > 3 cm Level III (Severe) Bleeding which affects hemodynamic parameters or retroperitoneal hemorrhage  Outcome: Completed/Met Date Met:  03/05/14

## 2014-03-05 NOTE — Plan of Care (Signed)
Problem: Phase I Progression Outcomes Goal: Post Cath/PCI return to appropriate Path Outcome: Completed/Met Date Met:  03/05/14

## 2014-03-05 NOTE — Plan of Care (Signed)
Problem: Phase I Progression Outcomes Goal: Voiding-avoid urinary catheter unless indicated Outcome: Completed/Met Date Met:  03/05/14 Goal: Vascular site scale level 0 - I Vascular Site Scale Level 0: No bruising/bleeding/hematoma Level I (Mild): Bruising/Ecchymosis, minimal bleeding/ooozing, palpable hematoma < 3 cm Level II (Moderate): Bleeding not affecting hemodynamic parameters, pseudoaneurysm, palpable hematoma > 3 cm Level III (Severe) Bleeding which affects hemodynamic parameters or retroperitoneal hemorrhage  Outcome: Completed/Met Date Met:  03/05/14

## 2014-03-05 NOTE — Plan of Care (Signed)
Problem: Consults Goal: MI Patient Education (See Patient Education module for education specifics.)  Outcome: Progressing Goal: Skin Care Protocol Initiated - if Braden Score 18 or less If consults are not indicated, leave blank or document N/A  Outcome: Not Applicable Date Met:  33/54/56 Goal: Tobacco Cessation referral if indicated Outcome: Completed/Met Date Met:  03/05/14 Discussed smoking cessation, states quit for 8 years but restarted recently due to stress of going through divorce.  States down to about 3 cigarettes a day lately.  Reviewed tips for success sheet.  Pt has no plan, just states she's going to quit.  Flat affect.  Encouragement given to quit.  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  Xanax given per pt request. Goal: Nutrition Consult-if indicated Outcome: Not Applicable Date Met:  25/63/89

## 2014-03-05 NOTE — H&P (View-Only) (Signed)
DAILY PROGRESS NOTE  Subjective:  HR improved today and overnight.  BP still borderline.  Patient reports orthopnea.  Otherwise has no complaints. No chest pain.  Objective:  Temp:  [98.2 F (36.8 C)-98.5 F (36.9 C)] 98.3 F (36.8 C) (11/16 0700) Pulse Rate:  [28-74] 69 (11/16 0800) Resp:  [14-16] 16 (11/15 1600) BP: (80-118)/(32-70) 108/69 mmHg (11/16 0800) SpO2:  [80 %-100 %] 100 % (11/16 0800) Weight change: 22 lb 15.5 oz (10.417 kg)  Intake/Output from previous day: 11/15 0701 - 11/16 0700 In: 3708 [P.O.:1470; I.V.:2238] Out: 2250 [Urine:2250]  Intake/Output from this shift: Total I/O In: 73.5 [I.V.:73.5] Out: -   Medications: Current Facility-Administered Medications  Medication Dose Route Frequency Provider Last Rate Last Dose  . 0.9 %  sodium chloride infusion   Intravenous Continuous Johnna Acosta, MD 10 mL/hr at 03/04/14 [phone]    . 0.9 %  sodium chloride infusion  250 mL Intravenous PRN Pixie Casino, MD      . 0.9 %  sodium chloride infusion   Intravenous Continuous Lucious Groves, DO      . acetaminophen (TYLENOL) tablet 650 mg  650 mg Oral Q4H PRN Peter M Martinique, MD      . ALPRAZolam Duanne Moron) tablet 1 mg  1 mg Oral BID PRN Lamar Sprinkles, MD   1 mg at 03/04/14 1702  . aspirin chewable tablet 81 mg  81 mg Oral Daily Peter M Martinique, MD   81 mg at 03/04/14 7867  . atorvastatin (LIPITOR) tablet 80 mg  80 mg Oral q1800 Peter M Martinique, MD   80 mg at 03/04/14 1702  . furosemide (LASIX) injection 20 mg  20 mg Intravenous Once Lucious Groves, DO      . heparin ADULT infusion 100 units/mL (25000 units/250 mL)  1,300 Units/hr Intravenous Continuous Peter M Martinique, MD 13 mL/hr at 03/05/14 0048 1,300 Units/hr at 03/05/14 0048  . insulin aspart (novoLOG) injection 0-15 Units  0-15 Units Subcutaneous TID WC Pixie Casino, MD   5 Units at 03/04/14 1640  . insulin aspart (novoLOG) injection 0-5 Units  0-5 Units Subcutaneous QHS Pixie Casino, MD   0 Units at  03/03/14 2135  . insulin aspart protamine- aspart (NOVOLOG MIX 70/30) injection 50 Units  50 Units Subcutaneous BID WC Pixie Casino, MD   50 Units at 03/04/14 1640  . ondansetron (ZOFRAN) injection 4 mg  4 mg Intravenous Q6H PRN Peter M Martinique, MD      . potassium chloride SA (K-DUR,KLOR-CON) CR tablet 40 mEq  40 mEq Oral Q4H Lucious Groves, DO      . sodium chloride 0.9 % bolus 250 mL  250 mL Intravenous Q30 min PRN Pixie Casino, MD      . sodium chloride 0.9 % injection 3 mL  3 mL Intravenous Q12H Pixie Casino, MD   3 mL at 03/04/14 2140  . sodium chloride 0.9 % injection 3 mL  3 mL Intravenous PRN Pixie Casino, MD      . ticagrelor [facility]) tablet 90 mg  90 mg Oral BID Peter M Martinique, MD   90 mg at 03/04/14 2145    Physical Exam: General appearance: alert and no distress Lungs: decreased bibasilar breath sounds Heart: regular rate and rhythm, S1, S2 normal, no murmur, click, rub or gallop Abdomen: soft, non-tender; bowel sounds normal; no masses,  no organomegaly Extremities: right groin appropriately tender, no hematoma, extremities normal, atraumatic, no  pedal edema Pulses: 2+ and symmetric Skin:  No rashes or lesions Neurologic: Grossly normal Psych: Normal  Lab Results: Results for orders placed or performed during the hospital encounter of 03/03/14 (from the past 48 hour(s))  APTT     Status: None   Collection Time: 03/03/14  2:32 PM  Result Value Ref Range   aPTT 28 24 - 37 seconds  CBC     Status: Abnormal   Collection Time: 03/03/14  2:32 PM  Result Value Ref Range   WBC 16.2 (H) 4.0 - 10.5 K/uL   RBC 4.62 3.87 - 5.11 MIL/uL   Hemoglobin 15.3 (H) 12.0 - 15.0 g/dL   HCT 43.4 36.0 - 46.0 %   MCV 93.9 78.0 - 100.0 fL   MCH 33.1 26.0 - 34.0 pg   MCHC 35.3 30.0 - 36.0 g/dL   RDW 15.1 11.5 - 15.5 %   Platelets 373 150 - 400 K/uL  Comprehensive metabolic panel     Status: Abnormal   Collection Time: 03/03/14  2:32 PM  Result Value Ref Range   Sodium 139  137 - 147 mEq/L   Potassium 3.7 3.7 - 5.3 mEq/L   Chloride 101 96 - 112 mEq/L   CO2 22 19 - 32 mEq/L   Glucose, Bld 255 (H) 70 - 99 mg/dL   BUN 13 6 - 23 mg/dL   Creatinine, Ser 0.52 0.50 - 1.10 mg/dL   Calcium 9.1 8.4 - 10.5 mg/dL   Total Protein 7.3 6.0 - 8.3 g/dL   Albumin 3.4 (L) 3.5 - 5.2 g/dL   AST 18 0 - 37 U/L   ALT 21 0 - 35 U/L   Alkaline Phosphatase 105 39 - 117 U/L   Total Bilirubin 0.3 0.3 - 1.2 mg/dL   GFR calc non Af Amer >90 >90 mL/min   GFR calc Af Amer >90 >90 mL/min    Comment: (NOTE) The eGFR has been calculated using the CKD EPI equation. This calculation has not been validated in all clinical situations. eGFR's persistently <90 mL/min signify possible Chronic Kidney Disease.    Anion gap 16 (H) 5 - 15  Protime-INR     Status: None   Collection Time: 03/03/14  2:32 PM  Result Value Ref Range   Prothrombin Time 13.3 11.6 - 15.2 seconds   INR 1.00 0.00 - 1.49  I-Stat Troponin, ED (not at [facility])     Status: None   Collection Time: 03/03/14  2:39 PM  Result Value Ref Range   Troponin i, poc 0.05 0.00 - 0.08 ng/mL   Comment 3            Comment: Due to the release kinetics of cTnI, a negative result within the first hours of the onset of symptoms does not rule out myocardial infarction with certainty. If myocardial infarction is still suspected, repeat the test at appropriate intervals.   CBC     Status: Abnormal   Collection Time: 03/03/14  4:00 PM  Result Value Ref Range   WBC 14.7 (H) 4.0 - 10.5 K/uL   RBC 4.38 3.87 - 5.11 MIL/uL   Hemoglobin 14.6 12.0 - 15.0 g/dL   HCT 42.4 36.0 - 46.0 %   MCV 96.8 78.0 - 100.0 fL   MCH 33.3 26.0 - 34.0 pg   MCHC 34.4 30.0 - 36.0 g/dL   RDW 15.3 11.5 - 15.5 %   Platelets 311 150 - 400 K/uL  Comprehensive metabolic panel     Status: Abnormal  Collection Time: 03/03/14  4:00 PM  Result Value Ref Range   Sodium 140 137 - 147 mEq/L   Potassium 3.4 (L) 3.7 - 5.3 mEq/L   Chloride 102 96 - 112 mEq/L   CO2 23 19 -  32 mEq/L   Glucose, Bld 228 (H) 70 - 99 mg/dL   BUN 13 6 - 23 mg/dL   Creatinine, Ser 0.51 0.50 - 1.10 mg/dL   Calcium 8.9 8.4 - 10.5 mg/dL   Total Protein 7.0 6.0 - 8.3 g/dL   Albumin 3.2 (L) 3.5 - 5.2 g/dL   AST 16 0 - 37 U/L   ALT 19 0 - 35 U/L   Alkaline Phosphatase 104 39 - 117 U/L   Total Bilirubin 0.3 0.3 - 1.2 mg/dL   GFR calc non Af Amer >90 >90 mL/min   GFR calc Af Amer >90 >90 mL/min    Comment: (NOTE) The eGFR has been calculated using the CKD EPI equation. This calculation has not been validated in all clinical situations. eGFR's persistently <90 mL/min signify possible Chronic Kidney Disease.    Anion gap 15 5 - 15  Hemoglobin A1c     Status: Abnormal   Collection Time: 03/03/14  4:00 PM  Result Value Ref Range   Hgb A1c MFr Bld 10.8 (H) <5.7 %    Comment: (NOTE)                                                                       According to the ADA Clinical Practice Recommendations for 2011, when HbA1c is used as a screening test:  >=6.5%   Diagnostic of Diabetes Mellitus           (if abnormal result is confirmed) 5.7-6.4%   Increased risk of developing Diabetes Mellitus References:Diagnosis and Classification of Diabetes Mellitus,Diabetes UUEK,8003,49(ZPHXT 1):S62-S69 and Standards of Medical Care in         Diabetes - 2011,Diabetes Care,2011,34 (Suppl 1):S11-S61.    Mean Plasma Glucose 263 (H) <117 mg/dL    Comment: Performed at [facility]  Lipid panel     Status: Abnormal   Collection Time: 03/03/14  4:00 PM  Result Value Ref Range   Cholesterol 259 (H) 0 - 200 mg/dL   Triglycerides 114 <150 mg/dL   HDL 38 (L) >39 mg/dL   Total CHOL/HDL Ratio 6.8 RATIO   VLDL 23 0 - 40 mg/dL   LDL Cholesterol 198 (H) 0 - 99 mg/dL    Comment:        Total Cholesterol/HDL:CHD Risk Coronary Heart Disease Risk Table                     Men   Women  1/2 Average Risk   3.4   3.3  Average Risk       5.0   4.4  2 X Average Risk   9.6   7.1  3 X Average Risk   23.4   11.0        Use the calculated Patient Ratio above and the CHD Risk Table to determine the patient's CHD Risk.        ATP III CLASSIFICATION (LDL):  <100     mg/dL   Optimal  100-129  mg/dL   Near or Above                    Optimal  130-159  mg/dL   Borderline  160-189  mg/dL   High  >190     mg/dL   Very High   CK total and CKMB (cardiac)     Status: None   Collection Time: 03/03/14  4:00 PM  Result Value Ref Range   Total CK 124 7 - 177 U/L   CK, MB 2.2 0.3 - 4.0 ng/mL   Relative Index 1.8 0.0 - 2.5  Troponin I     Status: None   Collection Time: 03/03/14  4:00 PM  Result Value Ref Range   Troponin I <0.30 <0.30 ng/mL    Comment:        Due to the release kinetics of cTnI, a negative result within the first hours of the onset of symptoms does not rule out myocardial infarction with certainty. If myocardial infarction is still suspected, repeat the test at appropriate intervals.   MRSA PCR Screening     Status: None   Collection Time: 03/03/14  4:09 PM  Result Value Ref Range   MRSA by PCR NEGATIVE NEGATIVE    Comment:        The GeneXpert MRSA Assay (FDA approved for NASAL specimens only), is one component of a comprehensive MRSA colonization surveillance program. It is not intended to diagnose MRSA infection nor to guide or monitor treatment for MRSA infections.   Glucose, capillary     Status: Abnormal   Collection Time: 03/03/14  4:46 PM  Result Value Ref Range   Glucose-Capillary 218 (H) 70 - 99 mg/dL  Glucose, capillary     Status: Abnormal   Collection Time: 03/03/14  9:27 PM  Result Value Ref Range   Glucose-Capillary 185 (H) 70 - 99 mg/dL   Comment 1 Capillary Sample   Basic metabolic panel     Status: Abnormal   Collection Time: 03/04/14  2:45 AM  Result Value Ref Range   Sodium 138 137 - 147 mEq/L   Potassium 3.6 (L) 3.7 - 5.3 mEq/L   Chloride 104 96 - 112 mEq/L   CO2 21 19 - 32 mEq/L   Glucose, Bld 181 (H) 70 - 99 mg/dL   BUN 12 6  - 23 mg/dL   Creatinine, Ser 0.47 (L) 0.50 - 1.10 mg/dL   Calcium 8.4 8.4 - 10.5 mg/dL   GFR calc non Af Amer >90 >90 mL/min   GFR calc Af Amer >90 >90 mL/min    Comment: (NOTE) The eGFR has been calculated using the CKD EPI equation. This calculation has not been validated in all clinical situations. eGFR's persistently <90 mL/min signify possible Chronic Kidney Disease.    Anion gap 13 5 - 15  CBC     Status: Abnormal   Collection Time: 03/04/14  2:45 AM  Result Value Ref Range   WBC 14.5 (H) 4.0 - 10.5 K/uL   RBC 4.29 3.87 - 5.11 MIL/uL   Hemoglobin 14.1 12.0 - 15.0 g/dL   HCT 41.9 36.0 - 46.0 %   MCV 97.7 78.0 - 100.0 fL   MCH 32.9 26.0 - 34.0 pg   MCHC 33.7 30.0 - 36.0 g/dL   RDW 15.5 11.5 - 15.5 %   Platelets 323 150 - 400 K/uL  Hemoglobin A1c     Status: Abnormal   Collection Time: 03/04/14  2:45 AM  Result Value Ref Range   Hgb A1c MFr Bld 11.2 (H) <5.7 %    Comment: (NOTE)                                                                       According to the ADA Clinical Practice Recommendations for 2011, when HbA1c is used as a screening test:  >=6.5%   Diagnostic of Diabetes Mellitus           (if abnormal result is confirmed) 5.7-6.4%   Increased risk of developing Diabetes Mellitus References:Diagnosis and Classification of Diabetes Mellitus,Diabetes TSVX,7939,03(ESPQZ 1):S62-S69 and Standards of Medical Care in         Diabetes - 2011,Diabetes RAQT,6226,33 (Suppl 1):S11-S61.    Mean Plasma Glucose 275 (H) <117 mg/dL    Comment: Performed at [facility]  Glucose, capillary     Status: Abnormal   Collection Time: 03/04/14  8:21 AM  Result Value Ref Range   Glucose-Capillary 229 (H) 70 - 99 mg/dL  Glucose, capillary     Status: Abnormal   Collection Time: 03/04/14 11:54 AM  Result Value Ref Range   Glucose-Capillary 153 (H) 70 - 99 mg/dL   Comment 1 Capillary Sample   Heparin level (unfractionated)     Status: Abnormal   Collection Time: 03/04/14   3:28 PM  Result Value Ref Range   Heparin Unfractionated <0.10 (L) 0.30 - 0.70 IU/mL    Comment:        IF HEPARIN RESULTS ARE BELOW EXPECTED VALUES, AND PATIENT DOSAGE HAS BEEN CONFIRMED, SUGGEST FOLLOW UP TESTING OF ANTITHROMBIN III LEVELS.   Glucose, capillary     Status: Abnormal   Collection Time: 03/04/14  3:58 PM  Result Value Ref Range   Glucose-Capillary 217 (H) 70 - 99 mg/dL   Comment 1 Capillary Sample   Glucose, capillary     Status: Abnormal   Collection Time: 03/04/14  9:39 PM  Result Value Ref Range   Glucose-Capillary 194 (H) 70 - 99 mg/dL  Heparin level (unfractionated)     Status: Abnormal   Collection Time: 03/05/14 12:00 AM  Result Value Ref Range   Heparin Unfractionated 0.17 (L) 0.30 - 0.70 IU/mL    Comment:        IF HEPARIN RESULTS ARE BELOW EXPECTED VALUES, AND PATIENT DOSAGE HAS BEEN CONFIRMED, SUGGEST FOLLOW UP TESTING OF ANTITHROMBIN III LEVELS.   Glucose, capillary     Status: Abnormal   Collection Time: 03/05/14  7:07 AM  Result Value Ref Range   Glucose-Capillary 214 (H) 70 - 99 mg/dL   Comment 1 Capillary Sample   CBC     Status: Abnormal   Collection Time: 03/05/14  7:47 AM  Result Value Ref Range   WBC 14.4 (H) 4.0 - 10.5 K/uL   RBC 4.23 3.87 - 5.11 MIL/uL   Hemoglobin 13.9 12.0 - 15.0 g/dL   HCT 41.4 36.0 - 46.0 %   MCV 97.9 78.0 - 100.0 fL   MCH 32.9 26.0 - 34.0 pg   MCHC 33.6 30.0 - 36.0 g/dL   RDW 15.5 11.5 - 15.5 %   Platelets 308 150 - 400 K/uL    Imaging: No results found.  Assessment: Principal Problem:   ST  elevation myocardial infarction (STEMI) involving other coronary artery of inferior wall Active Problems:   Hyperlipidemia   Diabetes type 2, uncontrolled   Nonalcoholic fatty liver disease   Fibromyalgia   ST elevation myocardial infarction (STEMI) involving right coronary artery with complication   Hypokalemia     Plan:  1. Hypotension and bradycardia improved.  If bradycardia recurs can consider  changing brilinta to effient or plavix but will need 1 year of DAPT. 2. Orthopnea: Patient net +2L after IVF bolus, likely volume overloaded echo yesterday showed EF45-50% with elevated CVP.  Will have 1 dose of IV Lasix (61m) and monitor BP. 3. STEMI: s/p DES to RCA, to go for PCI today to place sent in mid LAD (70% stenosis). 4. Hypokalemia: KDur 458m x2 5. T2DM: CBGs in 200s patient on Lantus 50u BID at home however changed to 70/30 inpatient. + Moderate SSI.  Reasonably well controlled and 70/30 will be more affordable outpatient.  Hold metformin.  Patient needs PCP and assistance with medications.  Will consult Care Management and Social Work. 6. NAFLD: monitor LFTs with 8064mf Atorvastatin. 7. ?HTN: patient has diagnosis of HTN but chart review shows no hypertensive readings.  Per previous notes patient was unable to tolerate low doses of Lisinopril.  Will remove HTN from patient's problem list.  Length of Stay:  LOS: 2 days    HofLucious Groves/16/2015, 8:29 AM

## 2014-03-05 NOTE — Progress Notes (Signed)
DAILY PROGRESS NOTE  Subjective:  HR improved today and overnight.  BP still borderline.  Patient reports orthopnea.  Otherwise has no complaints. No chest pain.  Objective:  Temp:  [98.2 F (36.8 C)-98.5 F (36.9 C)] 98.3 F (36.8 C) (11/16 0700) Pulse Rate:  [28-74] 69 (11/16 0800) Resp:  [14-16] 16 (11/15 1600) BP: (80-118)/(32-70) 108/69 mmHg (11/16 0800) SpO2:  [80 %-100 %] 100 % (11/16 0800) Weight change: 22 lb 15.5 oz (10.417 kg)  Intake/Output from previous day: 11/15 0701 - 11/16 0700 In: 3708 [P.O.:1470; I.V.:2238] Out: 2250 [Urine:2250]  Intake/Output from this shift: Total I/O In: 73.5 [I.V.:73.5] Out: -   Medications: Current Facility-Administered Medications  Medication Dose Route Frequency Provider Last Rate Last Dose  . 0.9 %  sodium chloride infusion   Intravenous Continuous Johnna Acosta, MD 10 mL/hr at 03/04/14 [phone]    . 0.9 %  sodium chloride infusion  250 mL Intravenous PRN Pixie Casino, MD      . 0.9 %  sodium chloride infusion   Intravenous Continuous Lucious Groves, DO      . acetaminophen (TYLENOL) tablet 650 mg  650 mg Oral Q4H PRN Peter M Martinique, MD      . ALPRAZolam Duanne Moron) tablet 1 mg  1 mg Oral BID PRN Lamar Sprinkles, MD   1 mg at 03/04/14 1702  . aspirin chewable tablet 81 mg  81 mg Oral Daily Peter M Martinique, MD   81 mg at 03/04/14 7867  . atorvastatin (LIPITOR) tablet 80 mg  80 mg Oral q1800 Peter M Martinique, MD   80 mg at 03/04/14 1702  . furosemide (LASIX) injection 20 mg  20 mg Intravenous Once Lucious Groves, DO      . heparin ADULT infusion 100 units/mL (25000 units/250 mL)  1,300 Units/hr Intravenous Continuous Peter M Martinique, MD 13 mL/hr at 03/05/14 0048 1,300 Units/hr at 03/05/14 0048  . insulin aspart (novoLOG) injection 0-15 Units  0-15 Units Subcutaneous TID WC Pixie Casino, MD   5 Units at 03/04/14 1640  . insulin aspart (novoLOG) injection 0-5 Units  0-5 Units Subcutaneous QHS Pixie Casino, MD   0 Units at  03/03/14 2135  . insulin aspart protamine- aspart (NOVOLOG MIX 70/30) injection 50 Units  50 Units Subcutaneous BID WC Pixie Casino, MD   50 Units at 03/04/14 1640  . ondansetron (ZOFRAN) injection 4 mg  4 mg Intravenous Q6H PRN Peter M Martinique, MD      . potassium chloride SA (K-DUR,KLOR-CON) CR tablet 40 mEq  40 mEq Oral Q4H Lucious Groves, DO      . sodium chloride 0.9 % bolus 250 mL  250 mL Intravenous Q30 min PRN Pixie Casino, MD      . sodium chloride 0.9 % injection 3 mL  3 mL Intravenous Q12H Pixie Casino, MD   3 mL at 03/04/14 2140  . sodium chloride 0.9 % injection 3 mL  3 mL Intravenous PRN Pixie Casino, MD      . ticagrelor [facility]) tablet 90 mg  90 mg Oral BID Peter M Martinique, MD   90 mg at 03/04/14 2145    Physical Exam: General appearance: alert and no distress Lungs: decreased bibasilar breath sounds Heart: regular rate and rhythm, S1, S2 normal, no murmur, click, rub or gallop Abdomen: soft, non-tender; bowel sounds normal; no masses,  no organomegaly Extremities: right groin appropriately tender, no hematoma, extremities normal, atraumatic, no  pedal edema Pulses: 2+ and symmetric Skin:  No rashes or lesions Neurologic: Grossly normal Psych: Normal  Lab Results: Results for orders placed or performed during the hospital encounter of 03/03/14 (from the past 48 hour(s))  APTT     Status: None   Collection Time: 03/03/14  2:32 PM  Result Value Ref Range   aPTT 28 24 - 37 seconds  CBC     Status: Abnormal   Collection Time: 03/03/14  2:32 PM  Result Value Ref Range   WBC 16.2 (H) 4.0 - 10.5 K/uL   RBC 4.62 3.87 - 5.11 MIL/uL   Hemoglobin 15.3 (H) 12.0 - 15.0 g/dL   HCT 43.4 36.0 - 46.0 %   MCV 93.9 78.0 - 100.0 fL   MCH 33.1 26.0 - 34.0 pg   MCHC 35.3 30.0 - 36.0 g/dL   RDW 15.1 11.5 - 15.5 %   Platelets 373 150 - 400 K/uL  Comprehensive metabolic panel     Status: Abnormal   Collection Time: 03/03/14  2:32 PM  Result Value Ref Range   Sodium 139  137 - 147 mEq/L   Potassium 3.7 3.7 - 5.3 mEq/L   Chloride 101 96 - 112 mEq/L   CO2 22 19 - 32 mEq/L   Glucose, Bld 255 (H) 70 - 99 mg/dL   BUN 13 6 - 23 mg/dL   Creatinine, Ser 0.52 0.50 - 1.10 mg/dL   Calcium 9.1 8.4 - 10.5 mg/dL   Total Protein 7.3 6.0 - 8.3 g/dL   Albumin 3.4 (L) 3.5 - 5.2 g/dL   AST 18 0 - 37 U/L   ALT 21 0 - 35 U/L   Alkaline Phosphatase 105 39 - 117 U/L   Total Bilirubin 0.3 0.3 - 1.2 mg/dL   GFR calc non Af Amer >90 >90 mL/min   GFR calc Af Amer >90 >90 mL/min    Comment: (NOTE) The eGFR has been calculated using the CKD EPI equation. This calculation has not been validated in all clinical situations. eGFR's persistently <90 mL/min signify possible Chronic Kidney Disease.    Anion gap 16 (H) 5 - 15  Protime-INR     Status: None   Collection Time: 03/03/14  2:32 PM  Result Value Ref Range   Prothrombin Time 13.3 11.6 - 15.2 seconds   INR 1.00 0.00 - 1.49  I-Stat Troponin, ED (not at [facility])     Status: None   Collection Time: 03/03/14  2:39 PM  Result Value Ref Range   Troponin i, poc 0.05 0.00 - 0.08 ng/mL   Comment 3            Comment: Due to the release kinetics of cTnI, a negative result within the first hours of the onset of symptoms does not rule out myocardial infarction with certainty. If myocardial infarction is still suspected, repeat the test at appropriate intervals.   CBC     Status: Abnormal   Collection Time: 03/03/14  4:00 PM  Result Value Ref Range   WBC 14.7 (H) 4.0 - 10.5 K/uL   RBC 4.38 3.87 - 5.11 MIL/uL   Hemoglobin 14.6 12.0 - 15.0 g/dL   HCT 42.4 36.0 - 46.0 %   MCV 96.8 78.0 - 100.0 fL   MCH 33.3 26.0 - 34.0 pg   MCHC 34.4 30.0 - 36.0 g/dL   RDW 15.3 11.5 - 15.5 %   Platelets 311 150 - 400 K/uL  Comprehensive metabolic panel     Status: Abnormal  Collection Time: 03/03/14  4:00 PM  Result Value Ref Range   Sodium 140 137 - 147 mEq/L   Potassium 3.4 (L) 3.7 - 5.3 mEq/L   Chloride 102 96 - 112 mEq/L   CO2 23 19 -  32 mEq/L   Glucose, Bld 228 (H) 70 - 99 mg/dL   BUN 13 6 - 23 mg/dL   Creatinine, Ser 0.51 0.50 - 1.10 mg/dL   Calcium 8.9 8.4 - 10.5 mg/dL   Total Protein 7.0 6.0 - 8.3 g/dL   Albumin 3.2 (L) 3.5 - 5.2 g/dL   AST 16 0 - 37 U/L   ALT 19 0 - 35 U/L   Alkaline Phosphatase 104 39 - 117 U/L   Total Bilirubin 0.3 0.3 - 1.2 mg/dL   GFR calc non Af Amer >90 >90 mL/min   GFR calc Af Amer >90 >90 mL/min    Comment: (NOTE) The eGFR has been calculated using the CKD EPI equation. This calculation has not been validated in all clinical situations. eGFR's persistently <90 mL/min signify possible Chronic Kidney Disease.    Anion gap 15 5 - 15  Hemoglobin A1c     Status: Abnormal   Collection Time: 03/03/14  4:00 PM  Result Value Ref Range   Hgb A1c MFr Bld 10.8 (H) <5.7 %    Comment: (NOTE)                                                                       According to the ADA Clinical Practice Recommendations for 2011, when HbA1c is used as a screening test:  >=6.5%   Diagnostic of Diabetes Mellitus           (if abnormal result is confirmed) 5.7-6.4%   Increased risk of developing Diabetes Mellitus References:Diagnosis and Classification of Diabetes Mellitus,Diabetes UUEK,8003,49(ZPHXT 1):S62-S69 and Standards of Medical Care in         Diabetes - 2011,Diabetes Care,2011,34 (Suppl 1):S11-S61.    Mean Plasma Glucose 263 (H) <117 mg/dL    Comment: Performed at [facility]  Lipid panel     Status: Abnormal   Collection Time: 03/03/14  4:00 PM  Result Value Ref Range   Cholesterol 259 (H) 0 - 200 mg/dL   Triglycerides 114 <150 mg/dL   HDL 38 (L) >39 mg/dL   Total CHOL/HDL Ratio 6.8 RATIO   VLDL 23 0 - 40 mg/dL   LDL Cholesterol 198 (H) 0 - 99 mg/dL    Comment:        Total Cholesterol/HDL:CHD Risk Coronary Heart Disease Risk Table                     Men   Women  1/2 Average Risk   3.4   3.3  Average Risk       5.0   4.4  2 X Average Risk   9.6   7.1  3 X Average Risk   23.4   11.0        Use the calculated Patient Ratio above and the CHD Risk Table to determine the patient's CHD Risk.        ATP III CLASSIFICATION (LDL):  <100     mg/dL   Optimal  100-129  mg/dL   Near or Above                    Optimal  130-159  mg/dL   Borderline  160-189  mg/dL   High  >190     mg/dL   Very High   CK total and CKMB (cardiac)     Status: None   Collection Time: 03/03/14  4:00 PM  Result Value Ref Range   Total CK 124 7 - 177 U/L   CK, MB 2.2 0.3 - 4.0 ng/mL   Relative Index 1.8 0.0 - 2.5  Troponin I     Status: None   Collection Time: 03/03/14  4:00 PM  Result Value Ref Range   Troponin I <0.30 <0.30 ng/mL    Comment:        Due to the release kinetics of cTnI, a negative result within the first hours of the onset of symptoms does not rule out myocardial infarction with certainty. If myocardial infarction is still suspected, repeat the test at appropriate intervals.   MRSA PCR Screening     Status: None   Collection Time: 03/03/14  4:09 PM  Result Value Ref Range   MRSA by PCR NEGATIVE NEGATIVE    Comment:        The GeneXpert MRSA Assay (FDA approved for NASAL specimens only), is one component of a comprehensive MRSA colonization surveillance program. It is not intended to diagnose MRSA infection nor to guide or monitor treatment for MRSA infections.   Glucose, capillary     Status: Abnormal   Collection Time: 03/03/14  4:46 PM  Result Value Ref Range   Glucose-Capillary 218 (H) 70 - 99 mg/dL  Glucose, capillary     Status: Abnormal   Collection Time: 03/03/14  9:27 PM  Result Value Ref Range   Glucose-Capillary 185 (H) 70 - 99 mg/dL   Comment 1 Capillary Sample   Basic metabolic panel     Status: Abnormal   Collection Time: 03/04/14  2:45 AM  Result Value Ref Range   Sodium 138 137 - 147 mEq/L   Potassium 3.6 (L) 3.7 - 5.3 mEq/L   Chloride 104 96 - 112 mEq/L   CO2 21 19 - 32 mEq/L   Glucose, Bld 181 (H) 70 - 99 mg/dL   BUN 12 6  - 23 mg/dL   Creatinine, Ser 0.47 (L) 0.50 - 1.10 mg/dL   Calcium 8.4 8.4 - 10.5 mg/dL   GFR calc non Af Amer >90 >90 mL/min   GFR calc Af Amer >90 >90 mL/min    Comment: (NOTE) The eGFR has been calculated using the CKD EPI equation. This calculation has not been validated in all clinical situations. eGFR's persistently <90 mL/min signify possible Chronic Kidney Disease.    Anion gap 13 5 - 15  CBC     Status: Abnormal   Collection Time: 03/04/14  2:45 AM  Result Value Ref Range   WBC 14.5 (H) 4.0 - 10.5 K/uL   RBC 4.29 3.87 - 5.11 MIL/uL   Hemoglobin 14.1 12.0 - 15.0 g/dL   HCT 41.9 36.0 - 46.0 %   MCV 97.7 78.0 - 100.0 fL   MCH 32.9 26.0 - 34.0 pg   MCHC 33.7 30.0 - 36.0 g/dL   RDW 15.5 11.5 - 15.5 %   Platelets 323 150 - 400 K/uL  Hemoglobin A1c     Status: Abnormal   Collection Time: 03/04/14  2:45 AM  Result Value Ref Range   Hgb A1c MFr Bld 11.2 (H) <5.7 %    Comment: (NOTE)                                                                       According to the ADA Clinical Practice Recommendations for 2011, when HbA1c is used as a screening test:  >=6.5%   Diagnostic of Diabetes Mellitus           (if abnormal result is confirmed) 5.7-6.4%   Increased risk of developing Diabetes Mellitus References:Diagnosis and Classification of Diabetes Mellitus,Diabetes TSVX,7939,03(ESPQZ 1):S62-S69 and Standards of Medical Care in         Diabetes - 2011,Diabetes RAQT,6226,33 (Suppl 1):S11-S61.    Mean Plasma Glucose 275 (H) <117 mg/dL    Comment: Performed at [facility]  Glucose, capillary     Status: Abnormal   Collection Time: 03/04/14  8:21 AM  Result Value Ref Range   Glucose-Capillary 229 (H) 70 - 99 mg/dL  Glucose, capillary     Status: Abnormal   Collection Time: 03/04/14 11:54 AM  Result Value Ref Range   Glucose-Capillary 153 (H) 70 - 99 mg/dL   Comment 1 Capillary Sample   Heparin level (unfractionated)     Status: Abnormal   Collection Time: 03/04/14   3:28 PM  Result Value Ref Range   Heparin Unfractionated <0.10 (L) 0.30 - 0.70 IU/mL    Comment:        IF HEPARIN RESULTS ARE BELOW EXPECTED VALUES, AND PATIENT DOSAGE HAS BEEN CONFIRMED, SUGGEST FOLLOW UP TESTING OF ANTITHROMBIN III LEVELS.   Glucose, capillary     Status: Abnormal   Collection Time: 03/04/14  3:58 PM  Result Value Ref Range   Glucose-Capillary 217 (H) 70 - 99 mg/dL   Comment 1 Capillary Sample   Glucose, capillary     Status: Abnormal   Collection Time: 03/04/14  9:39 PM  Result Value Ref Range   Glucose-Capillary 194 (H) 70 - 99 mg/dL  Heparin level (unfractionated)     Status: Abnormal   Collection Time: 03/05/14 12:00 AM  Result Value Ref Range   Heparin Unfractionated 0.17 (L) 0.30 - 0.70 IU/mL    Comment:        IF HEPARIN RESULTS ARE BELOW EXPECTED VALUES, AND PATIENT DOSAGE HAS BEEN CONFIRMED, SUGGEST FOLLOW UP TESTING OF ANTITHROMBIN III LEVELS.   Glucose, capillary     Status: Abnormal   Collection Time: 03/05/14  7:07 AM  Result Value Ref Range   Glucose-Capillary 214 (H) 70 - 99 mg/dL   Comment 1 Capillary Sample   CBC     Status: Abnormal   Collection Time: 03/05/14  7:47 AM  Result Value Ref Range   WBC 14.4 (H) 4.0 - 10.5 K/uL   RBC 4.23 3.87 - 5.11 MIL/uL   Hemoglobin 13.9 12.0 - 15.0 g/dL   HCT 41.4 36.0 - 46.0 %   MCV 97.9 78.0 - 100.0 fL   MCH 32.9 26.0 - 34.0 pg   MCHC 33.6 30.0 - 36.0 g/dL   RDW 15.5 11.5 - 15.5 %   Platelets 308 150 - 400 K/uL    Imaging: No results found.  Assessment: Principal Problem:   ST  elevation myocardial infarction (STEMI) involving other coronary artery of inferior wall Active Problems:   Hyperlipidemia   Diabetes type 2, uncontrolled   Nonalcoholic fatty liver disease   Fibromyalgia   ST elevation myocardial infarction (STEMI) involving right coronary artery with complication   Hypokalemia     Plan:  1. Hypotension and bradycardia improved.  If bradycardia recurs can consider  changing brilinta to effient or plavix but will need 1 year of DAPT. 2. Orthopnea: Patient net +2L after IVF bolus, likely volume overloaded echo yesterday showed EF45-50% with elevated CVP.  Will have 1 dose of IV Lasix (61m) and monitor BP. 3. STEMI: s/p DES to RCA, to go for PCI today to place sent in mid LAD (70% stenosis). 4. Hypokalemia: KDur 458m x2 5. T2DM: CBGs in 200s patient on Lantus 50u BID at home however changed to 70/30 inpatient. + Moderate SSI.  Reasonably well controlled and 70/30 will be more affordable outpatient.  Hold metformin.  Patient needs PCP and assistance with medications.  Will consult Care Management and Social Work. 6. NAFLD: monitor LFTs with 8064mf Atorvastatin. 7. ?HTN: patient has diagnosis of HTN but chart review shows no hypertensive readings.  Per previous notes patient was unable to tolerate low doses of Lisinopril.  Will remove HTN from patient's problem list.  Length of Stay:  LOS: 2 days    HofLucious Groves/16/2015, 8:29 AM

## 2014-03-05 NOTE — Plan of Care (Signed)
Problem: Phase II Progression Outcomes Goal: Anginal pain absent Outcome: Completed/Met Date Met:  03/05/14 Goal: Hemodynamically stable Outcome: Completed/Met Date Met:  03/05/14 Goal: Vascular site scale level 0 - I Vascular Site Scale Level 0: No bruising/bleeding/hematoma Level I (Mild): Bruising/Ecchymosis, minimal bleeding/ooozing, palpable hematoma < 3 cm Level II (Moderate): Bleeding not affecting hemodynamic parameters, pseudoaneurysm, palpable hematoma > 3 cm Level III (Severe) Bleeding which affects hemodynamic parameters or retroperitoneal hemorrhage  Outcome: Completed/Met Date Met:  03/05/14 Goal: Cardiac Rehab referral Outcome: Completed/Met Date Met:  03/05/14 Goal: Wean IV nitroglycerin to PO or topical Outcome: Completed/Met Date Met:  03/05/14 Goal: Discharge plan established Outcome: Completed/Met Date Met:  03/05/14 Goal: Tolerating diet Outcome: Completed/Met Date Met:  03/05/14 Goal: Other Phase II Outcomes/Goals Outcome: Not Applicable Date Met:  03/05/14     

## 2014-03-06 ENCOUNTER — Inpatient Hospital Stay (HOSPITAL_COMMUNITY): Payer: MEDICAID

## 2014-03-06 ENCOUNTER — Encounter (HOSPITAL_COMMUNITY): Payer: Self-pay | Admitting: Physician Assistant

## 2014-03-06 DIAGNOSIS — I959 Hypotension, unspecified: Secondary | ICD-10-CM

## 2014-03-06 DIAGNOSIS — R001 Bradycardia, unspecified: Secondary | ICD-10-CM | POA: Diagnosis present

## 2014-03-06 DIAGNOSIS — I2510 Atherosclerotic heart disease of native coronary artery without angina pectoris: Secondary | ICD-10-CM | POA: Diagnosis present

## 2014-03-06 DIAGNOSIS — I255 Ischemic cardiomyopathy: Secondary | ICD-10-CM | POA: Diagnosis present

## 2014-03-06 LAB — COMPREHENSIVE METABOLIC PANEL
ALT: 19 U/L (ref 0–35)
AST: 22 U/L (ref 0–37)
Albumin: 2.8 g/dL — ABNORMAL LOW (ref 3.5–5.2)
Alkaline Phosphatase: 87 U/L (ref 39–117)
Anion gap: 15 (ref 5–15)
BUN: 10 mg/dL (ref 6–23)
CO2: 20 mEq/L (ref 19–32)
Calcium: 8.7 mg/dL (ref 8.4–10.5)
Chloride: 101 mEq/L (ref 96–112)
Creatinine, Ser: 0.51 mg/dL (ref 0.50–1.10)
GFR calc Af Amer: >90 mL/min (ref >90)
GFR calc non Af Amer: >90 mL/min (ref >90)
Glucose, Bld: 212 mg/dL — ABNORMAL HIGH (ref 70–99)
Potassium: 3.9 mEq/L (ref 3.7–5.3)
Sodium: 136 mEq/L — ABNORMAL LOW (ref 137–147)
Total Bilirubin: 0.4 mg/dL (ref 0.3–1.2)
Total Protein: 6.4 g/dL (ref 6.0–8.3)

## 2014-03-06 LAB — POCT I-STAT, CHEM 8
BUN: 12 mg/dL (ref 6–23)
Calcium, Ion: 1.24 mmol/L — ABNORMAL HIGH (ref 1.12–1.23)
Chloride: 105 mEq/L (ref 96–112)
Creatinine, Ser: 0.5 mg/dL (ref 0.50–1.10)
Glucose, Bld: 244 mg/dL — ABNORMAL HIGH (ref 70–99)
HCT: 45 % (ref 36.0–46.0)
Hemoglobin: 15.3 g/dL — ABNORMAL HIGH (ref 12.0–15.0)
Potassium: 3.5 mEq/L — ABNORMAL LOW (ref 3.7–5.3)
Sodium: 143 mEq/L (ref 137–147)
TCO2: 25 mmol/L (ref 0–100)

## 2014-03-06 LAB — GLUCOSE, CAPILLARY
Glucose-Capillary: 219 mg/dL — ABNORMAL HIGH (ref 70–99)
Glucose-Capillary: 90 mg/dL (ref 70–99)

## 2014-03-06 LAB — POCT ACTIVATED CLOTTING TIME: Activated Clotting Time: 574 seconds

## 2014-03-06 LAB — CBC
HCT: 38.4 % (ref 36.0–46.0)
Hemoglobin: 13 g/dL (ref 12.0–15.0)
MCH: 32.7 pg (ref 26.0–34.0)
MCHC: 33.9 g/dL (ref 30.0–36.0)
MCV: 96.5 fL (ref 78.0–100.0)
Platelets: 309 10*3/uL (ref 150–400)
RBC: 3.98 MIL/uL (ref 3.87–5.11)
RDW: 15.5 % (ref 11.5–15.5)
WBC: 12.5 10*3/uL — ABNORMAL HIGH (ref 4.0–10.5)

## 2014-03-06 MED ORDER — PRAVASTATIN SODIUM 80 MG PO TABS: 80.0000 mg | ORAL_TABLET | Freq: Every day | ORAL | Status: DC

## 2014-03-06 MED ORDER — PRASUGREL HCL 10 MG PO TABS
60.0000 mg | ORAL_TABLET | Freq: Once | ORAL | Status: AC
  Administered 2014-03-06: 60 mg via ORAL
  Filled 2014-03-06: qty 6

## 2014-03-06 MED ORDER — PRASUGREL HCL 10 MG PO TABS: 10.0000 mg | ORAL_TABLET | Freq: Every day | ORAL | Status: DC

## 2014-03-06 MED ORDER — MORPHINE SULFATE 2 MG/ML IJ SOLN
1.0000 mg | Freq: Once | INTRAMUSCULAR | Status: AC
Start: 2014-03-06 — End: 2014-03-06
  Administered 2014-03-06: 08:00:00 1 mg via INTRAVENOUS
  Filled 2014-03-06: qty 1

## 2014-03-06 MED ORDER — HEART ATTACK BOUNCING BOOK
Freq: Once | Status: AC
  Administered 2014-03-06: 07:00:00
  Filled 2014-03-06: qty 1

## 2014-03-06 MED ORDER — METFORMIN HCL 1000 MG PO TABS: 1000.0000 mg | ORAL_TABLET | Freq: Two times a day (BID) | ORAL | Status: DC

## 2014-03-06 MED ORDER — ASPIRIN EC 81 MG PO TBEC: 81.0000 mg | DELAYED_RELEASE_TABLET | Freq: Every day | ORAL | Status: DC

## 2014-03-06 NOTE — Progress Notes (Signed)
Pt c/o headache and "just don't feel good."  Very vague with complaint, states feels like she's "breathing heavy."  O2 sat 95% on RA, lungs CTA.  BP 111/65.  While pt was drinking soda to swallow tramadol, Tele showed sudden decrease in HR down to 30's nonsustained, see strip in EPIC.  Back up to SR 60's.  Placed on 2L per [place] for comfort.  Pt now dozing quietly.

## 2014-03-06 NOTE — Discharge Summary (Signed)
Discharge Summary   Patient ID: [name] MRN: [id number], DOB/AGE: 1966/02/11 48 y.o. Admit date: 03/03/2014 D/C date:     03/06/2014  Primary Care Provider: Vic Blackbird, MD Primary Cardiologist: [place]  Primary Discharge Diagnoses:  1. Inferior STEMI / CAD s/p DES to prox RCA 03/03/14, staged cath 03/05/14 for LAD showing significantly improved stenosis of mid LAD with resolution of prior thrombus 2. Hypotension/bradycardia during admit, suspected due to RV involvement 3. Ischemic cardiomyopathy EF 45-50% by echo 03/04/14 with suspected mild acute diastolic CHF s/p IV Lasix 11/16 4. Diabetes mellitus uncontorlled A1C 11.2. On SSI in hospital. Resume Lantus insulin. Resume metformin 48 hours post cath yesterday. 5. Hyperlipidemia LDL 198. On high dose lipitor.If unable to afford may need to switch to pravastatin 80 mg daily. 6. NASH  Secondary Discharge Diagnoses:  1. H/o persistent leukocytosis, afebrile - followed by Dr. Tressie Stalker 2. Fibromyalgia 3. H/o C diff colitis 4. Sea-blue histiocyte syndromes/p splenectomy 5. Peripheral neuropathy 6. H/o URI with dry productive cough since then 05/2013 7. Anxiety 8. GERD 9. H/o hiatal hernia  Hospital Course: [name] is a 48 y/o F with a history of fibromyalgia, dyslipidemia, uncontrolled DM2, leukocytosis previously seen by Dr. Tressie Stalker, peripheral neuropathy who presented to [facility] 03/03/2014 with chest pain and inferior STEMI. She presented with chest pain like a brick on her chest, radiating to both arms, with DOE and nausea. Symptoms persisted approximately 1 hour prior to arrival. She had little relief with nitroglycerin, however, had hypotension by EMS enroute. EKG demonstrated 1-2 mm inferior ST elevation with inferior and lateral T-wave inversions. CODE STEMI was activated and she was taken by Dr. Martinique to the cath lab. She was noted to have a proximally occluded RCA which was successfully stented with a  DES. She was also noted to have an ulcerated plaque in the mid-LAD which appeared to be around 70% stenosed. Post procedurally she did have hypotension and intermittent bradycardia which was felt due to RV involvement. This limited the post-MI therapy she was able to receive, including no BB or ACEI. EF by cath was 50-55% with mild inferior HK. 2D echo 03/04/14 showed LVEF 45-50%, mild inferior hypokinesis, mild MR, trace to mild TR, RVSP 30 mmHg, dilated IVC, normal RA/RV size and RV function. Prior to cath she received small dose of IV Lasix in attempts to alleviate orthopnea. She went back to the cath lab 03/05/14 for staged PCI to address the LAD - however, this demonstrated significantly improved stenosis of the mid LAD with resolution of prior thrombus and residual narrowing of 20 to less than 30%. Continued medical therapy was recommended. She did have continued sense of breathlessness for 2 days however was not hypoxic. She was not tachypnic or tachycardic and had no evidence of DVT on exam. Her Brilinta was changed to Effient in attempt to alleviate this. CXR showed no active CP disease. DAPT for 1 year was recommended. Care management saw the patient for assistance since she has no insurance and she was also given 30 day free rx card along with regular rx with refills. We have asked she remain out of work until seen in followup. Due to tendency towards soft BP we held off on prescripting SL NTG at discharge. Dr. Martinique has seen and examined the patient today and feels he is stable for discharge.  Unfortunately due to insurance issues the patient does not think she can afford Lipitor. Will increase her pravastatin to 80mg  daily. Consider f/u  labs 6 weeks. We have also asked her to hold Metformin x 48 hrs post cath. Note A1C was very high at 11 but the patient states she just started her meds back a week ago due to her lack of insurance. She was instructed to f/u with her PCP for this. I have left a  message on our office's scheduling inbox requesting a follow-up appointment, and our office will call the patient with this appointment.  Discharge Vitals: Blood pressure 109/61, pulse 71, temperature 97.9 F (36.6 C), temperature source Axillary, resp. rate 18, height 5\' 2"  (1.575 m), weight 185 lb 13.6 oz (84.3 kg), SpO2 98 %.  Labs: Lab Results  Component Value Date   WBC 12.5* 03/06/2014   HGB 13.0 03/06/2014   HCT 38.4 03/06/2014   MCV 96.5 03/06/2014   PLT 309 03/06/2014     Recent Labs Lab 03/06/14 0359  NA 136*  K 3.9  CL 101  CO2 20  BUN 10  CREATININE 0.51  CALCIUM 8.7  PROT 6.4  BILITOT 0.4  ALKPHOS 87  ALT 19  AST 22  GLUCOSE 212*    Recent Labs  03/03/14 1600  CKTOTAL 124  CKMB 2.2  TROPONINI <0.30   Lab Results  Component Value Date   CHOL 259* 03/03/2014   HDL 38* 03/03/2014   LDLCALC 198* 03/03/2014   TRIG 114 03/03/2014    Diagnostic Studies/Procedures   Dg Chest 2 View  03/06/2014   CLINICAL DATA:  Dyspnea.  Myocardial infarction.  EXAM: CHEST  2 VIEW  COMPARISON:  02/23/2013.  FINDINGS: Lung volumes are relatively low. Allowing for this, the lungs are clear. No pleural effusion or pneumothorax.  Cardiac silhouette is normal in size and configuration. Normal mediastinal and hilar contours  Bony thorax is intact.  IMPRESSION: No active cardiopulmonary disease.   Electronically Signed   By: Lajean Manes M.D.   On: 03/06/2014 08:55     2D echo 11/15/15Study Conclusions - Left ventricle: The cavity size was normal. Wall thickness was normal. Systolic function was mildly reduced. The estimated ejection fraction was in the range of 45% to 50%. Mild inferior hypokinesis. Left ventricular diastolic function parameters were normal. - Mitral valve: Mildly thickened leaflets . There was mild regurgitation. - Left atrium: The atrium was normal in size. - Right ventricle: The cavity size was normal. Systolic function was normal. -  Right atrium: The atrium was normal in size. - Tricuspid valve: There was trivial regurgitation. - Pulmonary arteries: PA peak pressure: 30 mm Hg (S). - Inferior vena cava: The vessel was dilated. The respirophasic diameter changes were blunted (< 50%), consistent with elevated central venous pressure. - Pericardium, extracardiac: There was no pericardial effusion. Impressions: - LVEF 45-50%, mild inferior hypokinesis, mild MR, trace to mild TR, RVSP 30 mmHg, dilated IVC, normal RA/RV size and RV function.  Cath #1 03/03/14 Cardiac Catheterization Procedure Note  Name: [name] MRN: [id number] DOB: Aug 18, 1965  Procedure: Left Heart Cath, Selective Coronary Angiography, LV angiography, PTCA/Stent of the proximal RCA  Indication: [name] with history of IDDM, HL, and tobacco abuse presents with an Inferior STEMI  Diagnostic Procedure Details: We initially attempted right radial access. Despite getting good arterial flow with needle stick on 3 occasions we were unable to thread the wire. The right groin was prepped, draped, and anesthetized with 1% lidocaine. Using the modified Seldinger technique, a 6 French sheath was introduced into the right femoral artery. Standard Judkins catheters were used for  selective coronary angiography and left ventriculography. Catheter exchanges were performed over a wire. The diagnostic procedure was well-tolerated without immediate complications.  PROCEDURAL FINDINGS Hemodynamics: AO 109/53 mean 75 mm Hg LV 106/21 mm Hg  Coronary angiography: Coronary dominance: right  Left mainstem: Normal.   Left anterior descending (LAD): The LAD has an ulcerated complex stenosis in the mid vessel up to 70-80%.  Left circumflex (LCx): Normal.  Right coronary artery (RCA): 100% occlusion proximally.   Left ventriculography: Left ventricular systolic function is good, LVEF is estimated at 50-55%, there is mild inferior hypokinesis.  There is no significant mitral regurgitation   PCI Procedure Note: Following the diagnostic procedure, the decision was made to proceed with PCI. Brilinta 180 mg was given orally. Weight-based bivalirudin was given for anticoagulation. Once a therapeutic ACT was achieved, a 6 [place] FR4 guide catheter was inserted. The RCA spontaneously reperfused. A prowater coronary guidewire was used to cross the lesion. The lesion was predilated with a 2.5 mm balloon. There was some distal embolization of thrombus but this resolved with repositioning wire into the PLOM. The lesion was then stented with a 3.5 x 20 mm Promus stent. The stent was postdilated with a 3.5 mm noncompliant balloon. Following PCI, there was 0% residual stenosis and TIMI-3 flow. Final angiography confirmed an excellent result. Femoral hemostasis was achieved with an Angioseal device. The patient tolerated the PCI procedure well. There were no immediate procedural complications. The patient was transferred to the post catheterization recovery area for further monitoring. PCI Data: Vessel - RCA/Segment - proximal Percent Stenosis (pre) 100% TIMI-flow 0 Stent 3.5 x 20 mm Promus Percent Stenosis (post) 0% TIMI-flow (post) 3 Final Conclusions:  1. 2 vessel obstructive CAD. Culprit lesion is the proximal RCA occlusion.  2. Good LV function 3. Successful stenting of the proximal RCA with a DES. Recommendations: DAPT for one year. Aggressive risk factor modification. Would consider PCI of the LAD in 2 days.  Peter Martinique, [place] 03/03/2014, 3:42 PM  Cath #2 03/05/14 HISTORY:   [name] is a 48 y.o. female he presented with an ST segment elevation inferior wall myocardial infarction on 03/03/2014. She underwent successful acute PCI to a totally occluded RCA with insertion of a ES 3.520 mm Promus stent. At the time. She also had an ulcerated plaque with 70-80% stenosis in the mid LAD. She is brought back to the  catheterization laboratory today to undergo intervention to her LAD lesion.   PROCEDURE: Left heart catheterization: coronary angiography, left ventriculography   The patient was brought to the [facility] cardiac catherization laboratory in the fasting state with plan to perform a percutaneous coronary intervention to the previously documented ulcerated plaque in the mid LAD. She was premedicated with Versed 2 mg and fentanyl 50 g. Her right groin was prepped and shaved in usual sterile fashion. Xylocaine 1% was used for local anesthesia. A 6 French sheath was inserted into the R femoral artery. Initial diagnostic catheterization was performed utilizing a 5 [place] JR4 diagnostic catheter. With the demonstration of a widely patent RCA. Following acute ST segment elevation MI stenting 2 days previously, the decision was made to proceed with the planned procedure to the LAD. A 6 French XB LAD 3.5 guide was then inserted. Angiograms were performed in multiple views and demonstrated significant improvement in the previous ulcerated lesion with resolution of prior thrombus and with only a residual narrowing of 20% at the site of previous mid LAD significant stenosis. The plan to perform  PCI was then aborted. Angiomax was not administered. A 5 French pigtail catheter was then inserted for RAO ventriculography. Hemostasis was obtained by direct manual pressure. The patient tolerated the procedure well.  HEMODYNAMICS:  Central Aorta: 102/62  Left Ventricle: 102/19  ANGIOGRAPHY:  Left main: A long angiographically normal vessel which bifurcated into the LAD and left circumflex vessel.   LAD: A moderate size vessel that gave rise to a proximal diagonal vessel and several septal perforating arteries. Previously in the mid LAD. There was an ulcerated plaque with thrombus, which was felt to have significant stenosis. Today, however, the thrombus had entirely resolved and there was a  residual stenosis of 20 to less than 30% at this site. There was no evidence for dissection. There was brisk TIMI-3 flow.  [place] Intermediate  Left circumflex: Angiographically normal gave rise to one major marginal branch..   Right coronary artery: Large dominant vessel with widely patent stent at the site of initial total occlusion from 2 days previously in the proximal to mid RCA without evidence for restenosis.  Left ventriculography revealed global ejection fraction approximately 50%. There was residual moderate mid to mid basal inferior hypocontractility.   IMPRESSION:  Mild LV dysfunction with mild-to-moderate mid-to mid, basal inferior hypocontractility and an ejection fraction of approximately 50% in this patient 2 days status post ST segment elevation myocardial infarction secondary to total occlusion of the proximal RCA.  Significantly improved stenosis of the mid LAD with resolution of prior thrombus and residual narrowing of 20 to less than 30%.  Normal left circumflex coronary artery.  RECOMMENDATION:  Medical therapy with continued aggressive lipid-lowering therapy, statin, ACE-I/ARB, beta blocker post MI. Smoking cessation is imperative. The patient will be maintained on dual antiplatelet therapy for minimum of 1 year.  Troy Sine, MD, [facility] 03/05/2014 3:57 PM   Discharge Medications   Current Discharge Medication List    START taking these medications   Details  aspirin EC 81 MG tablet Take 1 tablet (81 mg total) by mouth daily. Qty: 30 tablet, Refills: 11    prasugrel (EFFIENT) 10 MG TABS tablet Take 1 tablet (10 mg total) by mouth daily. Qty: 30 tablet, Refills: 10      CONTINUE these medications which have CHANGED   Details  metFORMIN (GLUCOPHAGE) 1000 MG tablet Take 1 tablet (1,000 mg total) by mouth 2 (two) times daily with a meal. Restart AM of 03/08/14    pravastatin (PRAVACHOL) 80 MG tablet Take 1 tablet (80 mg total) by mouth  daily. Qty: 30 tablet, Refills: 6      CONTINUE these medications which have NOT CHANGED   Details  acetaminophen (TYLENOL) 500 MG tablet Take 1,000 mg by mouth every 6 (six) hours as needed for mild pain or moderate pain.     alprazolam (XANAX) 2 MG tablet Take 2 mg by mouth 2 (two) times daily as needed for sleep or anxiety.    amitriptyline (ELAVIL) 50 MG tablet Take 50 mg by mouth at bedtime.    Insulin Glargine (LANTUS SOLOSTAR) 100 UNIT/ML Solostar Pen Inject 50 Units into the skin 2 (two) times daily.    pregabalin (LYRICA) 300 MG capsule Take 1 capsule (300 mg total) by mouth 2 (two) times daily.     ranitidine (ZANTAC) 150 MG tablet Take 150 mg by mouth 2 (two) times daily. Alternate with pepcid    traMADol (ULTRAM) 50 MG tablet Take 1 tablet (50 mg total) by mouth every 6 (six) hours as needed.  Disposition   The patient will be discharged in stable condition to home. Discharge Instructions    Diet - low sodium heart healthy    Complete by:  As directed      Increase activity slowly    Complete by:  As directed   No driving for 2 weeks. No lifting over 10 lbs for 4 weeks. No sexual activity for 4 weeks. You may not return to work until cleared by your cardiologist. Keep procedure site clean & dry. If you notice increased pain, swelling, bleeding or pus, call/return!  You may shower, but no soaking baths/hot tubs/pools for 1 week.   Do not restart your Metformin until the morning of 03/08/14.          Follow-up Information    Follow up with Peter Martinique, MD.   Specialty:  Cardiology   Why:  Office will call you for your followup appointment. Call office if you have not heard back in 3 days.   Contact information:   [place] [address] [phone]       Follow up with Vic Blackbird, MD.   Specialty:  Family Medicine   Why:  For your diabetes and monitoring of your elevated white blood cell count   Contact information:    [place] [address] [place] [address] [phone]         Duration of Discharge Encounter: Greater than 30 minutes including physician and PA time.  Signed, Melina Copa PA-C 03/06/2014, 9:31 AM

## 2014-03-06 NOTE — Progress Notes (Addendum)
Patient: [name] / Admit Date: 03/03/2014 / Date of Encounter: 03/06/2014, 7:14 AM   Subjective: Complains of HA. Feels breathless. No chest pain.   Objective: Telemetry: NSR with sinus brady.  Physical Exam: Blood pressure 111/65, pulse 65, temperature 98 F (36.7 C), temperature source Oral, resp. rate 20, height 5\' 2"  (1.575 m), weight 185 lb 13.6 oz (84.3 kg), SpO2 95 %. General: Well developed, well nourished, in no acute distress. Head: Normocephalic, atraumatic, sclera non-icteric, no xanthomas, nares are without discharge. Neck: Negative for carotid bruits. JVP not elevated. Lungs: Clear bilaterally to auscultation without wheezes, rales, or rhonchi. Breathing is unlabored. Heart: RRR S1 S2 without murmurs, rubs, or gallops.  Abdomen: Soft, non-tender, non-distended with normoactive bowel sounds. No rebound/guarding. Extremities: No clubbing or cyanosis. No edema. Distal pedal pulses are 2+ and equal bilaterally. Groin is without hematoma. Neuro: Alert and oriented X 3. Moves all extremities spontaneously. Psych:  Responds to questions appropriately with a normal affect.   Intake/Output Summary (Last 24 hours) at 03/06/14 0714 Last data filed at 03/06/14 0405  Gross per 24 hour  Intake 2850.67 ml  Output   1700 ml  Net 1150.67 ml    Inpatient Medications:  . aspirin  81 mg Oral Daily  . atorvastatin  80 mg Oral q1800  . heart attack bouncing book   Does not apply Once  . insulin aspart  0-15 Units Subcutaneous TID WC  . insulin aspart  0-5 Units Subcutaneous QHS  . insulin aspart protamine- aspart  50 Units Subcutaneous BID WC  . ticagrelor  90 mg Oral BID   Infusions:  . sodium chloride 10 mL/hr at 03/04/14 0918  . sodium chloride Stopped (03/06/14 0010)    Labs:  Recent Labs  03/04/14 0245 03/06/14 0359  NA 138 136*  K 3.6* 3.9  CL 104 101  CO2 21 20  GLUCOSE 181* 212*  BUN 12 10  CREATININE 0.47* 0.51  CALCIUM 8.4 8.7    Recent Labs  03/03/14 1600 03/06/14 0359  AST 16 22  ALT 19 19  ALKPHOS 104 87  BILITOT 0.3 0.4  PROT 7.0 6.4  ALBUMIN 3.2* 2.8*    Recent Labs  03/05/14 0747 03/06/14 0359  WBC 14.4* 12.5*  HGB 13.9 13.0  HCT 41.4 38.4  MCV 97.9 96.5  PLT 308 309    Recent Labs  03/03/14 1600  CKTOTAL 124  CKMB 2.2  TROPONINI <0.30   Invalid input(s): POCBNP  Recent Labs  03/04/14 0245  HGBA1C 11.2*     Radiology/Studies:  No results found.   Assessment and Plan  1. Inferior STEMI / CAD s/p DES to prox RCA 03/03/14, staged cath 03/05/14 for LAD showing significantly improved stenosis of mid LAD with resolution of prior thrombus 2. Hypotension/bradycardia during admit, suspected due to RV involvement 3. LV dysfunction EF 45-50% by echo 03/04/14 with suspected mild acute diastolic CHF s/p IV Lasix 11/16 4. Diabetes mellitus uncontorlled A1C 11.2. On SSI in hospital. Resume Lantus insulin. Resume metformin 48 hours post cath yesterday. 5. Dyslipidemia LDL 198. On high dose lipitor.If unable to afford may need to switch to pravastatin 80 mg daily. 6. NASH 7. Persistent leukocytosis, afebrile   Signed, Dayna Dunn PA-C  Patient seen and examined and history reviewed. Agree with above findings and plan. Patient complains of dyspnea (breathlessness) for last 2 days. No evidence of CHF. Lungs clear. May be related to [place]. Will switch to Effient. Should be able to get a year's  worth of therapy with patient assistance. Will ambulate in hall today. If stable can DC later today. Follow up in 2 weeks.   Mailen Newborn [place], [place] 03/06/2014 7:29 AM

## 2014-03-06 NOTE — Progress Notes (Signed)
CARDIAC REHAB PHASE I   PRE:  Rate/Rhythm: 77 SR  BP:  Supine: 122/66  Sitting:   Standing:    SaO2:   MODE:  Ambulation: 500 ft   POST:  Rate/Rhythm: 90 SR  BP:  Supine:   Sitting: 111/75  Standing:    SaO2:  0919-8022 Pt to Xray prior to me seeing. Waited for pt to return. Pt walked 500 ft with steady gait. No CP. MI education completed stressing importance of effient with stent, NTG use, MI restrictions, diet. Gave pt diabetic and heart healthy diets. Discussed carb counting with pt and elevated HGA1C. Pt stated that she could not afford insulin and when she is on insulin her sugars are controlled. Stated she is able to get her insulin now. Discussed importance of keeping it controlled. Gave pt fake cigarette and discussed smoking cessation. She stated she is down to about three a day. Had quit for about 8 years but restarted in January with stressful event. Plans to quit cold [place] again. Gave smoking cessation handouts. Discussed CRP 2 and pt gave permission to refer to [facility]. Will need financial asst.  Sat in recliner for ed and then to bed.   Graylon Good, RN BSN  03/06/2014 9:32 AM

## 2014-03-08 ENCOUNTER — Telehealth: Payer: Self-pay | Admitting: Cardiology

## 2014-03-08 NOTE — Telephone Encounter (Signed)
Returned call to patient she stated she wanted to know when she can drive.Stated she received 2 different times.Stated one paper said 2 to 3 days and other sheet said no driving in 2 weeks.Advised discharge instructions no driving in 2 weeks.Stated she did not want to wait 2 weeks.Message sent to [place] for advice.

## 2014-03-08 NOTE — Telephone Encounter (Signed)
She should not drive for 2 weeks since she had an MI.  Aashrith Eves [place] MD, [facility]

## 2014-03-08 NOTE — Telephone Encounter (Signed)
Pt has a dtent put in Saturday. How soon can she frive?

## 2014-03-08 NOTE — Telephone Encounter (Signed)
Returned call to patient Dr.Jordan advised no driving for 2 weeks since you had a MI.

## 2014-03-28 ENCOUNTER — Encounter: Payer: Self-pay | Admitting: Cardiology

## 2014-03-28 ENCOUNTER — Ambulatory Visit (INDEPENDENT_AMBULATORY_CARE_PROVIDER_SITE_OTHER): Payer: Self-pay | Admitting: Cardiology

## 2014-03-28 VITALS — BP 140/82 | HR 71 | Ht 62.0 in | Wt 193.5 lb

## 2014-03-28 DIAGNOSIS — IMO0002 Reserved for concepts with insufficient information to code with codable children: Secondary | ICD-10-CM

## 2014-03-28 DIAGNOSIS — E785 Hyperlipidemia, unspecified: Secondary | ICD-10-CM

## 2014-03-28 DIAGNOSIS — I2111 ST elevation (STEMI) myocardial infarction involving right coronary artery: Secondary | ICD-10-CM

## 2014-03-28 DIAGNOSIS — I10 Essential (primary) hypertension: Secondary | ICD-10-CM

## 2014-03-28 DIAGNOSIS — K068 Other specified disorders of gingiva and edentulous alveolar ridge: Secondary | ICD-10-CM

## 2014-03-28 DIAGNOSIS — I209 Angina pectoris, unspecified: Secondary | ICD-10-CM

## 2014-03-28 DIAGNOSIS — K055 Other periodontal diseases: Secondary | ICD-10-CM

## 2014-03-28 DIAGNOSIS — M797 Fibromyalgia: Secondary | ICD-10-CM

## 2014-03-28 DIAGNOSIS — E1165 Type 2 diabetes mellitus with hyperglycemia: Secondary | ICD-10-CM

## 2014-03-28 DIAGNOSIS — R413 Other amnesia: Secondary | ICD-10-CM

## 2014-03-28 DIAGNOSIS — I208 Other forms of angina pectoris: Secondary | ICD-10-CM

## 2014-03-28 DIAGNOSIS — I25110 Atherosclerotic heart disease of native coronary artery with unstable angina pectoris: Secondary | ICD-10-CM

## 2014-03-28 MED ORDER — NITROGLYCERIN 0.4 MG SL SUBL: 0.4000 mg | SUBLINGUAL_TABLET | SUBLINGUAL | Status: DC | PRN

## 2014-03-28 MED ORDER — CARVEDILOL 3.125 MG PO TABS: 3.1250 mg | ORAL_TABLET | Freq: Two times a day (BID) | ORAL | Status: DC

## 2014-03-28 NOTE — Assessment & Plan Note (Signed)
Has bleeding gums with effient and asa, but have asked her to continue both for now.  May be able to change to plavix at later date.

## 2014-03-28 NOTE — Patient Instructions (Addendum)
START COREG 3.125 Twice daily, this is on the $4 list at [facility] has requested that you have a lexiscan myoview. For further information please visit [email or web address]. Please follow instruction sheet, as given.  Non-Cardiac CT scanning, (CAT scanning), is a noninvasive, special x-ray that produces cross-sectional images of the body using x-rays and a computer. CT scans help physicians diagnose and treat medical conditions. For some CT exams, a contrast material is used to enhance visibility in the area of the body being studied. CT scans provide greater clarity and reveal more details than regular x-ray exams.  Your physician recommends that you schedule a follow-up appointment in: 3 weeks with Mickel Baas

## 2014-03-28 NOTE — Assessment & Plan Note (Signed)
Patient is looking for disability secondary to fibromyalgia. I'm not sure how much the fibroma myalgia is playing in on her own chest wall-type pain she denies fibromyalgia.

## 2014-03-28 NOTE — Assessment & Plan Note (Signed)
a.  Inferior STEMI / CAD s/p DES to prox RCA 03/03/14, staged cath 03/05/14 for LAD showing significantly improved stenosis of mid LAD with resolution of prior thrombus. See above note.

## 2014-03-28 NOTE — Progress Notes (Signed)
03/28/2014   PCP: No PCP Per Patient   Chief Complaint  Patient presents with  . Follow-up    pt denies chest pain today but stated that she had chest pain on friday. pt states that she does experience sob. pt denies swelling.    Primary Cardiologist:Dr. P. Martinique   HPI:  48 year old female with a history of fibromyalgia, dyslipidemia, uncontrolled DM2, leukocytosis previously seen by Dr. Tressie Stalker, peripheral neuropathy who presented to [name] 03/03/2014 with chest pain and inferior STEMI 03/03/14.  Emergent cardiac cath revealed a proximally occluded RCA which was successfully stented with a DES. She was also noted to have an ulcerated plaque in the mid-LAD which appeared to be around 70% stenosed.  Post procedure she had hypotension and intermittent bradycardia. Therefore she could not be started on an ace or beta blocker at that time. Her pressure continued to be low she was not discharged with nitroglycerin due to the fact as well.  She did have shortness of breath post procedure Brilinta was changed to Effient. Plans were for repeat cardiac cath to evaluate the LAD ulcerated plaque.  Repeat cath 03/05/2014:  LAD: A moderate size vessel that gave rise to a proximal diagonal vessel and several septal perforating arteries. Previously in the mid LAD. There was an ulcerated plaque with thrombus, which was felt to have significant stenosis. Today, however, the thrombus had entirely resolved and there was a residual stenosis of 20 to less than 30% at this site. There was no evidence for dissection. There was brisk TIMI-3 flow.  She stabilized and was ready for discharge on the 17th.  Since discharge she has had several episodes of shortness of breath with exertion.  Additionally she has had 2 episodes of chest pain one was very minor the second occurred last Friday morning she almost came back to the [facility]. Severe chest pain with shortness of breath.  She  rested and it eventually resolved.  She has several complaints 1- chest pain is described in shortness of breath; 2- chest wall-type pain on both sides when she lies on either side, she denies this is her fibromyalgia. 3- memory loss. She really has no recollection of what happened in the [facility] and the other days her friend took her to [employer] she has no recollection of going to [employer].  She forgets easily and has to write everything down.  The patient does not work , is unable to due to fibromyalgia. She has no real income.  She is filling out papers to assist with Effient.  We are providing samples for Effient as we can. We are giving medication that she can obtain for $4 a [employer]. Her mother has been helping her by some of her medications but she herself is on a fixed income.       Allergies  Allergen Reactions  . Aspartame And Phenylalanine Shortness Of Breath and Swelling    Throat swells  ANY ARTIFICAL SWEETNERS  . Toradol [Ketorolac Tromethamine] Other (See Comments)    Face & neck flushed red & pt felt very hot after IV adm.  . Sucralose Rash    Pt.s face broke out in rash after drinking Breeza.  Pt. Claims it is from the artificial sweetener.     . Dilaudid [Hydromorphone Hcl]     Itch  . Hydrocodone Itching  . Oxycodone Rash    itching    Current Outpatient Prescriptions  Medication Sig  Dispense Refill  . alprazolam (XANAX) 2 MG tablet Take 2 mg by mouth 2 (two) times daily as needed for sleep or anxiety.    Marland Kitchen amitriptyline (ELAVIL) 50 MG tablet Take 50 mg by mouth at bedtime.    Marland Kitchen aspirin EC 81 MG tablet Take 1 tablet (81 mg total) by mouth daily. 30 tablet 11  . insulin aspart (NOVOLOG) 100 UNIT/ML injection Inject 100 Units into the skin 2 (two) times daily. 10 units twice a day    . Insulin Glargine (LANTUS SOLOSTAR) 100 UNIT/ML Solostar Pen Inject 50 Units into the skin 2 (two) times daily.    . metFORMIN (GLUCOPHAGE) 1000 MG tablet Take 1 tablet (1,000 mg total) by  mouth 2 (two) times daily with a meal.    . prasugrel (EFFIENT) 10 MG TABS tablet Take 1 tablet (10 mg total) by mouth daily. 30 tablet 10  . pravastatin (PRAVACHOL) 80 MG tablet Take 1 tablet (80 mg total) by mouth daily. 30 tablet 6  . pregabalin (LYRICA) 300 MG capsule Take 1 capsule (300 mg total) by mouth 2 (two) times daily. 60 capsule 3  . ranitidine (ZANTAC) 150 MG tablet Take 150 mg by mouth as needed. Alternate with pepcid    . traMADol (ULTRAM) 50 MG tablet Take 1 tablet (50 mg total) by mouth every 6 (six) hours as needed. 60 tablet 1  . carvedilol (COREG) 3.125 MG tablet Take 1 tablet (3.125 mg total) by mouth 2 (two) times daily. 180 tablet 3  . nitroGLYCERIN (NITROSTAT) 0.4 MG SL tablet Place 1 tablet (0.4 mg total) under the tongue every 5 (five) minutes as needed for chest pain. 25 tablet 3   No current facility-administered medications for this visit.    Past Medical History  Diagnosis Date  . Diabetes mellitus   . Dyslipidemia   . Clostridium difficile colitis   . Sea-blue histiocyte syndrome     a. s/p splenectomy.  . Leukocytosis     Dr Tressie Stalker  . Fibromyalgia   . Neuropathy, peripheral   . Upper respiratory infection 2/15    states has dry non productive cough since then- "tickle in my throat" no fever  . Anxiety   . GERD (gastroesophageal reflux disease)   . H/O hiatal hernia   . CAD (coronary artery disease)     a.  Inferior STEMI / CAD s/p DES to prox RCA 03/03/14, staged cath 03/05/14 for LAD showing significantly improved stenosis of mid LAD with resolution of prior thrombus.  . Sinus bradycardia     a. During 02/2014 admit, not on BB due to this.  . Ischemic cardiomyopathy     a. EF 45-50% by echo 03/04/14 with suspected mild acute diastolic CHF s/p dose of IV Lasix.  Marland Kitchen NASH (nonalcoholic steatohepatitis)     Past Surgical History  Procedure Laterality Date  . Splenectomy    . Tonsillectomy    . Abdominal hysterectomy      partial  .  Esophagogastroduodenoscopy  08/27/2011    Rourk-reactive gastropathy, normal, patent esophagus. Stomach empty. Multiple linear antral erosions. No ulcer or infiltrating process. Small hiatal hernia. Patent pylorus. Normal first and second portion of the duodenum  . Colonoscopy  12/07/2011    Rourk-normal rectum,, colon and terminal ileum  . Shoulder arthroscopy with open rotator cuff repair Left 03/02/2013    Procedure: LEFT SHOULDER ARTHROSCOPY, SUBACROMIAL DECOMPRESSION, MANIPULATION UNDER ANESTHESIA;  Surgeon: [name], MD;  Location: [facility];  Service: Orthopedics;  Laterality: Left;  .  Shoulder open rotator cuff repair Left 07/27/2013    Procedure: LEFT MINI OPEN ROTATOR CUFF REPAIR ;  Surgeon: [name], MD;  Location: [facility];  Service: Orthopedics;  Laterality: Left;  . Cardiac catheterization      POE:UMPNTIR:WE colds or fevers, no weight changes Skin:no rashes or ulcers HEENT:no blurred vision, no congestion, + bleeding gums CV:see HPI PUL:see HPI GI:no diarrhea constipation or melena, no indigestion GU:no hematuria, no dysuria MS:no joint pain, no claudication Neuro:no syncope, no lightheadedness, + memory loss Endo:+ diabetes- now has insulin for 1 year., no thyroid disease  Wt Readings from Last 3 Encounters:  03/28/14 193 lb 8 oz (87.771 kg)  03/06/14 185 lb 13.6 oz (84.3 kg)  08/21/13 194 lb (87.998 kg)    PHYSICAL EXAM BP 140/82 mmHg  Pulse 71  Ht 5\' 2"  (1.575 m)  Wt 193 lb 8 oz (87.771 kg)  BMI 35.38 kg/m2 General:Pleasant affect, NAD Skin:Warm and dry, brisk capillary refill HEENT:normocephalic, sclera clear, mucus membranes moist Neck:supple, no JVD, no bruits  Heart:S1S2 RRR without murmur, gallup, rub or click, no chest wall pain to palpation  Lungs:clear without rales, rhonchi, or wheezes RXV:QMGQQ, soft, non tender, + BS, do not palpate liver spleen or masses Ext:no lower ext edema, 2+ pedal pulses, 2+ radial pulses Neuro:alert and oriented X  3, MAE, follows commands, + facial symmetry, tongue midline, equal strengths with upper and lower extremities   EKG:SR incomplete RBBB, ST depression in V4-6, t wave inversions in III, AVF,  New ant depression  ASSESSMENT AND PLAN ST elevation myocardial infarction (STEMI) involving right coronary artery with complication Recent STEMI now improving EKG of right coronary artery is stable. Patient is taking Effient and aspirin as indicated.  Samples of Effient were given as well as aspirin.  Angina decubitus Severe episode of angina on Friday associated with shortness of breath. With additional disease of the LAD that had resolved on second cardiac cath we will proceed with a Lexiscan Myoview to further evaluate ischemia. I've given her prescription for nitroglycerin she said to only take one while laying down if she has chest pain. Her blood pressure can be labile.  And also now starting Corag 3.125 mg twice a day to see if that would help her chest pain as well as for her coronary artery disease. Her blood pressure today is good but has been 761 systolic recently therefore will have to go slow on adding her ACE inhibitor.  CAD (coronary artery disease) a.  Inferior STEMI / CAD s/p DES to prox RCA 03/03/14, staged cath 03/05/14 for LAD showing significantly improved stenosis of mid LAD with resolution of prior thrombus. See above note.    Hyperlipidemia On pravachol 80 mg, has no to very little money to buy meds.  Will continue at 80 mg for now, may need to readdress in future.   Diabetes type 2, uncontrolled Improved control, has 1 year of insulin.  Fibromyalgia Patient is looking for disability secondary to fibromyalgia. I'm not sure how much the fibroma myalgia is playing in on her own chest wall-type pain she denies fibromyalgia.  Essential hypertension Today blood pressure is elevated from her usual. We will begin Coreg 3.125 mg twice a day unable to add ACE inhibitor as of yet will need  to go slow [place] her blood pressure is labile.  I will see her back in 3 weeks at that time depending on above test we will add ACE inhibitor.  Memory loss She has no  or little memory of the hospitalization.  She was taken shopping recently without remembering the trip.  Short term memory is poor. neuro exam was normal.  Will check non contrast CT of the head.  Bleeding gums Has bleeding gums with effient and asa, but have asked her to continue both for now.  May be able to change to plavix at later date.   Pt will follow up with me in 3 weeks, then with Dr. P. Martinique, this is depending on tests.

## 2014-03-28 NOTE — Assessment & Plan Note (Signed)
She has no or little memory of the hospitalization.  She was taken shopping recently without remembering the trip.  Short term memory is poor. neuro exam was normal.  Will check non contrast CT of the head.

## 2014-03-28 NOTE — Assessment & Plan Note (Signed)
On pravachol 80 mg, has no to very little money to buy meds.  Will continue at 80 mg for now, may need to readdress in future.

## 2014-03-28 NOTE — Assessment & Plan Note (Signed)
Recent STEMI now improving EKG of right coronary artery is stable. Patient is taking Effient and aspirin as indicated.  Samples of Effient were given as well as aspirin.

## 2014-03-28 NOTE — Assessment & Plan Note (Signed)
Severe episode of angina on Friday associated with shortness of breath. With additional disease of the LAD that had resolved on second cardiac cath we will proceed with a Lexiscan Myoview to further evaluate ischemia. I've given her prescription for nitroglycerin she said to only take one while laying down if she has chest pain. Her blood pressure can be labile.  And also now starting Corag 3.125 mg twice a day to see if that would help her chest pain as well as for her coronary artery disease. Her blood pressure today is good but has been 128 systolic recently therefore will have to go slow on adding her ACE inhibitor.

## 2014-03-28 NOTE — Assessment & Plan Note (Signed)
Improved control, has 1 year of insulin.

## 2014-03-28 NOTE — Assessment & Plan Note (Signed)
Today blood pressure is elevated from her usual. We will begin Coreg 3.125 mg twice a day unable to add ACE inhibitor as of yet will need to go slow [place] her blood pressure is labile.  I will see her back in 3 weeks at that time depending on above test we will add ACE inhibitor.

## 2014-03-29 ENCOUNTER — Ambulatory Visit (HOSPITAL_COMMUNITY)
Admission: RE | Admit: 2014-03-29 | Discharge: 2014-03-29 | Disposition: A | Discharge status: Home / Self Care | Payer: MEDICAID | Source: Ambulatory Visit | Attending: Cardiology | Admitting: Cardiology

## 2014-03-29 ENCOUNTER — Encounter (HOSPITAL_COMMUNITY): Payer: Self-pay | Admitting: Cardiology

## 2014-03-29 DIAGNOSIS — R413 Other amnesia: Secondary | ICD-10-CM | POA: Insufficient documentation

## 2014-04-02 ENCOUNTER — Other Ambulatory Visit: Payer: Self-pay

## 2014-04-02 ENCOUNTER — Ambulatory Visit (HOSPITAL_COMMUNITY)
Admission: RE | Admit: 2014-04-02 | Discharge: 2014-04-02 | Disposition: A | Discharge status: Home / Self Care | Payer: MEDICAID | Source: Ambulatory Visit | Attending: Cardiology | Admitting: Cardiology

## 2014-04-02 DIAGNOSIS — R0600 Dyspnea, unspecified: Secondary | ICD-10-CM | POA: Insufficient documentation

## 2014-04-02 DIAGNOSIS — R079 Chest pain, unspecified: Secondary | ICD-10-CM | POA: Insufficient documentation

## 2014-04-02 DIAGNOSIS — I209 Angina pectoris, unspecified: Secondary | ICD-10-CM

## 2014-04-02 MED ORDER — TECHNETIUM TC 99M SESTAMIBI GENERIC - CARDIOLITE
30.0000 | Freq: Once | INTRAVENOUS | Status: AC | PRN
  Administered 2014-04-02: 30 via INTRAVENOUS

## 2014-04-02 MED ORDER — TECHNETIUM TC 99M SESTAMIBI GENERIC - CARDIOLITE
10.0000 | Freq: Once | INTRAVENOUS | Status: AC | PRN
  Administered 2014-04-02: 10 via INTRAVENOUS

## 2014-04-02 MED ORDER — REGADENOSON 0.4 MG/5ML IV SOLN: 0.4000 mg | Freq: Once | INTRAVENOUS | Status: DC

## 2014-04-02 MED ORDER — REGADENOSON 0.4 MG/5ML IV SOLN
INTRAVENOUS | Status: AC
  Administered 2014-04-02: 0.4 mg
  Filled 2014-04-02: qty 5

## 2014-04-03 ENCOUNTER — Telehealth: Payer: Self-pay

## 2014-04-03 NOTE — Telephone Encounter (Signed)
Patient called was told stress test normal.Pateint stated she continues to have fast heart beat.Stated she is just sitting and her heart beat is fast.Stated she started on coreg 3.125 mg on 03/28/14 and she don't see any improvement.No chest pain.Message sent to Cecilie Kicks NP for advice.

## 2014-04-03 NOTE — Telephone Encounter (Signed)
Increase the coreg to 6.25 mg BID but have her get BP check in the office first with RN.  Just to make sure her BP is stable.  The stress test only means that she is not having a lack of blood supply to her heart.  She can still have fast HR.  If the fast HR continues despite increase of BB by next week, she will need cardionet for 2 weeks.

## 2014-04-03 NOTE — Addendum Note (Signed)
Addended by: Golden Hurter D on: 04/03/2014 01:13 PM   Modules accepted: Orders, Medications

## 2014-04-03 NOTE — Telephone Encounter (Signed)
Returned call to patient [facility] K[facility] NP advised to increase coreg to 6.25 mg twice a day.B/P check appointment scheduled 04/09/14 at 10:00 am.Stated she will check B/P at home.Advised if heart beat still fast after 1 week of increasing coreg [name] advised will need a cardionet monitor for 2 weeks.

## 2014-04-09 ENCOUNTER — Ambulatory Visit: Payer: Self-pay | Admitting: *Deleted

## 2014-04-09 VITALS — BP 138/82 | HR 84

## 2014-04-09 DIAGNOSIS — I10 Essential (primary) hypertension: Secondary | ICD-10-CM

## 2014-04-18 ENCOUNTER — Ambulatory Visit (INDEPENDENT_AMBULATORY_CARE_PROVIDER_SITE_OTHER): Payer: Self-pay | Admitting: Cardiology

## 2014-04-18 ENCOUNTER — Encounter: Payer: Self-pay | Admitting: Cardiology

## 2014-04-18 VITALS — BP 130/70 | HR 72 | Ht 62.5 in | Wt 192.0 lb

## 2014-04-18 DIAGNOSIS — K068 Other specified disorders of gingiva and edentulous alveolar ridge: Secondary | ICD-10-CM

## 2014-04-18 DIAGNOSIS — E785 Hyperlipidemia, unspecified: Secondary | ICD-10-CM

## 2014-04-18 DIAGNOSIS — I2510 Atherosclerotic heart disease of native coronary artery without angina pectoris: Secondary | ICD-10-CM

## 2014-04-18 DIAGNOSIS — K055 Other periodontal diseases: Secondary | ICD-10-CM

## 2014-04-18 DIAGNOSIS — IMO0002 Reserved for concepts with insufficient information to code with codable children: Secondary | ICD-10-CM

## 2014-04-18 DIAGNOSIS — I10 Essential (primary) hypertension: Secondary | ICD-10-CM

## 2014-04-18 DIAGNOSIS — R413 Other amnesia: Secondary | ICD-10-CM

## 2014-04-18 DIAGNOSIS — E1165 Type 2 diabetes mellitus with hyperglycemia: Secondary | ICD-10-CM

## 2014-04-18 MED ORDER — CARVEDILOL 6.25 MG PO TABS: 12.5000 mg | ORAL_TABLET | Freq: Two times a day (BID) | ORAL | Status: DC

## 2014-04-18 NOTE — Assessment & Plan Note (Addendum)
a.  Inferior STEMI / CAD s/p DES to prox RCA 03/03/14, staged cath 03/05/14 for LAD showing significantly improved stenosis of mid LAD with resolution of prior thrombus.  Continue effient and ASA for 1 year at least.  Today still with SOB and racing HR, with walk in office her sp02 does not decrease, her HR climbs to the 90s, she did become SOB.  Her lexiscn  myoview stress test was negative for ischemia, and EF improved to normal at 59%.  I discussed with Dr. Martinique and from cardiac perspective she is doing well.  I am increasing her coreg to 12.5 mg BID to prevent increased HR.  For her SOB we talked about deconditioning and she should take 2 walks a day for 5 min to start working up to 10 min and higher if possible.  She will follow up with Dr. P. Martinique in Jan.

## 2014-04-18 NOTE — Assessment & Plan Note (Signed)
I have asked her to see a dentist.

## 2014-04-18 NOTE — Assessment & Plan Note (Signed)
Followed by PCP

## 2014-04-18 NOTE — Progress Notes (Signed)
04/18/2014   PCP: Everrett Coombe, NP   Chief Complaint  Patient presents with  . Follow-up    stress test - c/o SOB anytime    Primary Cardiologist:Dr. P. Martinique   HPI:  48 year old female with a history of fibromyalgia, dyslipidemia, uncontrolled DM2, leukocytosis previously seen by Dr. Tressie Stalker, peripheral neuropathy who presented to [facility] 03/03/2014 with chest pain and inferior STEMI 03/03/14. Emergent cardiac cath revealed a proximally occluded RCA which was successfully stented with a DES. She was also noted to have an ulcerated plaque in the mid-LAD which appeared to be around 70% stenosed. Post procedure she had hypotension and intermittent bradycardia. Therefore she could not be started on an ace or beta blocker at that time. Her pressure continued to be low she was not discharged with nitroglycerin due to the fact as well. She did have shortness of breath post procedure Brilinta was changed to Effient. Plans were for repeat cardiac cath to evaluate the LAD ulcerated plaque.  Repeat cath 03/05/2014: LAD: A moderate size vessel that gave rise to a proximal diagonal vessel and several septal perforating arteries. Previously in the mid LAD. There was an ulcerated plaque with thrombus, which was felt to have significant stenosis. Today, however, the thrombus had entirely resolved and there was a residual stenosis of 20 to less than 30% at this site. There was no evidence for dissection. There was brisk TIMI-3 flow.  She stabilized and was ready for discharge on the 17th. Since discharge she has had several episodes of shortness of breath with exertion. Additionally she has had 2 episodes of chest pain one was very minor the second occurred prior to last visit  that almost sent herto the hospital. Severe chest pain with shortness of breath. She rested and it eventually resolved.  She had several complaints on last visit:   1- chest pain is  described in shortness of breath; 2- chest wall-type pain on both sides when she lies on either side, she denies this is her fibromyalgia. 3- memory loss. She really has no recollection of what happened in the hospital and the other days her friend took her to [employer] she has no recollection of going to [employer]. She forgets easily and has to write everything down.  The patient does not work , is unable to due to fibromyalgia. She has no real income. She is filling out papers to assist with Effient. We are providing samples for Effient as we can. We are giving medication that she can obtain for $4 a [employer]. Her mother has been helping her by some of her medications but she herself is on a fixed income.    Pt had nuclear stress test that was negative for ischemia and EF back to normal.  Her CT of her head was without abnormality.   She is back today for follow up.  We had added coreg and with increased HR had doubled to 6.25 BID.  Today still with SOB even at night, and she has racing HR.  Also complains of continued poor memory.    Allergies  Allergen Reactions  . Aspartame And Phenylalanine Shortness Of Breath and Swelling    Throat swells  ANY ARTIFICAL SWEETNERS  . Toradol [Ketorolac Tromethamine] Other (See Comments)    Face & neck flushed red & pt felt very hot after IV adm.  . Sucralose Rash    Pt.s face broke out in rash after drinking  Breeza.  Pt. Claims it is from the artificial sweetener.     . Dilaudid [Hydromorphone Hcl]     Itch  . Hydrocodone Itching  . Oxycodone Rash    itching    Current Outpatient Prescriptions  Medication Sig Dispense Refill  . alprazolam (XANAX) 2 MG tablet Take 2 mg by mouth 2 (two) times daily as needed for sleep or anxiety.    Marland Kitchen amitriptyline (ELAVIL) 50 MG tablet Take 50 mg by mouth at bedtime.    Marland Kitchen aspirin EC 81 MG tablet Take 1 tablet (81 mg total) by mouth daily. 30 tablet 11  . carvedilol (COREG) 6.25 MG tablet Take 2 tablets (12.5 mg total)  by mouth 2 (two) times daily with a meal. 120 tablet 3  . insulin aspart (NOVOLOG) 100 UNIT/ML injection Inject 100 Units into the skin 2 (two) times daily. 10 units twice a day    . Insulin Glargine (LANTUS SOLOSTAR) 100 UNIT/ML Solostar Pen Inject 50 Units into the skin 2 (two) times daily.    . nitroGLYCERIN (NITROSTAT) 0.4 MG SL tablet Place 1 tablet (0.4 mg total) under the tongue every 5 (five) minutes as needed for chest pain. 25 tablet 3  . prasugrel (EFFIENT) 10 MG TABS tablet Take 1 tablet (10 mg total) by mouth daily. 30 tablet 10  . pravastatin (PRAVACHOL) 80 MG tablet Take 1 tablet (80 mg total) by mouth daily. 30 tablet 6  . pregabalin (LYRICA) 300 MG capsule Take 1 capsule (300 mg total) by mouth 2 (two) times daily. 60 capsule 3  . ranitidine (ZANTAC) 150 MG tablet Take 150 mg by mouth as needed. Alternate with pepcid    . sitaGLIPtin-metformin (JANUMET) 50-1000 MG per tablet Take 1 tablet by mouth 2 (two) times daily with a meal.    . traMADol (ULTRAM) 50 MG tablet Take 1 tablet (50 mg total) by mouth every 6 (six) hours as needed. 60 tablet 1   No current facility-administered medications for this visit.    Past Medical History  Diagnosis Date  . Diabetes mellitus   . Dyslipidemia   . Clostridium difficile colitis   . Sea-blue histiocyte syndrome     a. s/p splenectomy.  . Leukocytosis     Dr Tressie Stalker  . Fibromyalgia   . Neuropathy, peripheral   . Upper respiratory infection 2/15    states has dry non productive cough since then- "tickle in my throat" no fever  . Anxiety   . GERD (gastroesophageal reflux disease)   . H/O hiatal hernia   . CAD (coronary artery disease)     a.  Inferior STEMI / CAD s/p DES to prox RCA 03/03/14, staged cath 03/05/14 for LAD showing significantly improved stenosis of mid LAD with resolution of prior thrombus.  . Sinus bradycardia     a. During 02/2014 admit, not on BB due to this.  . Ischemic cardiomyopathy     a. EF 45-50% by echo  03/04/14 with suspected mild acute diastolic CHF s/p dose of IV Lasix.  Marland Kitchen NASH (nonalcoholic steatohepatitis)     Past Surgical History  Procedure Laterality Date  . Splenectomy    . Tonsillectomy    . Abdominal hysterectomy      partial  . Esophagogastroduodenoscopy  08/27/2011    Rourk-reactive gastropathy, normal, patent esophagus. Stomach empty. Multiple linear antral erosions. No ulcer or infiltrating process. Small hiatal hernia. Patent pylorus. Normal first and second portion of the duodenum  . Colonoscopy  12/07/2011  Rourk-normal rectum,, colon and terminal ileum  . Shoulder arthroscopy with open rotator cuff repair Left 03/02/2013    Procedure: LEFT SHOULDER ARTHROSCOPY, SUBACROMIAL DECOMPRESSION, MANIPULATION UNDER ANESTHESIA;  Surgeon: [name], MD;  Location: [facility];  Service: Orthopedics;  Laterality: Left;  . Shoulder open rotator cuff repair Left 07/27/2013    Procedure: LEFT MINI OPEN ROTATOR CUFF REPAIR ;  Surgeon: [name], MD;  Location: [facility];  Service: Orthopedics;  Laterality: Left;  . Cardiac catheterization  03/05/14    improved LAD  . Coronary angioplasty with stent placement  03/03/14    DES to RCA,  ulcerated plaque in LAD  . Left heart cath Bilateral 03/03/2014    Procedure: LEFT HEART CATH;  Surgeon: Peter M Martinique, MD;  Location: [facility] CATH LAB;  Service: Cardiovascular;  Laterality: Bilateral;  . Percutaneous coronary stent intervention (pci-s) N/A 03/05/2014    Procedure: PERCUTANEOUS CORONARY STENT INTERVENTION (PCI-S);  Surgeon: Troy Sine, MD;  Location: [facility] CATH LAB;  Service: Cardiovascular;  Laterality: N/A;    OEU:MPNTIRW:ER colds or fevers, no weight changes Skin:no rashes or ulcers HEENT:no blurred vision, no congestion, with bleeding gums and dry mouth. May be meds but she needs the meds she is on.     CV:see HPI PUL:see HPI GI:no diarrhea constipation or melena, no indigestion GU:no hematuria, no dysuria MS:no joint pain, no  claudication Neuro:no syncope, no lightheadedness, does continue with memory loss Endo:+ diabetes elevated glucose, janumet added, no thyroid disease  Wt Readings from Last 3 Encounters:  04/18/14 192 lb (87.091 kg)  03/28/14 193 lb 8 oz (87.771 kg)  03/06/14 185 lb 13.6 oz (84.3 kg)    PHYSICAL EXAM BP 130/70 mmHg  Pulse 72  Ht 5' 2.5" (1.588 m)  Wt 192 lb (87.091 kg)  BMI 34.54 kg/m2  SpO2 95% General:Pleasant affect though somewhat flat., NAD Skin:Warm and dry, brisk capillary refill HEENT:normocephalic, sclera clear, mucus membranes moist Neck:supple, no JVD, no bruits  Heart:S1S2 RRR without murmur, gallup, rub or click Lungs:clear without rales, rhonchi, or wheezes XVQ:MGQQ, non tender, + BS, do not palpate liver spleen or masses Ext:no lower ext edema, 2+ pedal pulses, 2+ radial pulses Neuro:alert and oriented X 3, MAE, follows commands, + facial symmetry  With ambulation of 4-5 min HR up to 90s and sp02 95-97%. She was SOB and stated her heart was racing.   ASSESSMENT AND PLAN CAD (coronary artery disease) a.  Inferior STEMI / CAD s/p DES to prox RCA 03/03/14, staged cath 03/05/14 for LAD showing significantly improved stenosis of mid LAD with resolution of prior thrombus.  Continue effient and ASA for 1 year at least.  Today still with SOB and racing HR, with walk in office her sp02 does not decrease, her HR climbs to the 90s, she did become SOB.  Her lexiscn  myoview stress test was negative for ischemia, and EF improved to normal at 59%.  I discussed with Dr. Martinique and from cardiac perspective she is doing well.  I am increasing her coreg to 12.5 mg BID to prevent increased HR.  For her SOB we talked about deconditioning and she should take 2 walks a day for 5 min to start working up to 10 min and higher if possible.  She will follow up with Dr. P. Martinique in Jan.      Hyperlipidemia She is on Pravachol at 80 mg continue and we will recheck in a few weeks.  She could  not afford the  lipitor.  Memory loss CT head without abnormality.  For continued memory loss would recommend neuro consult.  Will ask PCP to arrange.  Bleeding gums I have asked her to see a dentist.  Essential hypertension BP stable and should tolerate increased dose of coreg.  Diabetes type 2, uncontrolled Followed by PCP

## 2014-04-18 NOTE — Patient Instructions (Addendum)
Your physician has recommended you make the following change in your medication: INCREASE carvedilol to 12.5mg  twice daily (take 2 6.25mg  tablets by mouth twice daily)  You have a follow up with Dr. Martinique on 05/10/2014 @ 4:15pm

## 2014-04-18 NOTE — Assessment & Plan Note (Signed)
BP stable and should tolerate increased dose of coreg.

## 2014-04-18 NOTE — Assessment & Plan Note (Signed)
She is on Pravachol at 80 mg continue and we will recheck in a few weeks.  She could not afford the lipitor.

## 2014-04-18 NOTE — Assessment & Plan Note (Signed)
CT head without abnormality.  For continued memory loss would recommend neuro consult.  Will ask PCP to arrange.

## 2014-04-25 ENCOUNTER — Ambulatory Visit: Payer: Self-pay | Admitting: Cardiology

## 2014-05-09 ENCOUNTER — Ambulatory Visit (HOSPITAL_COMMUNITY)
Admission: RE | Admit: 2014-05-09 | Discharge: 2014-05-09 | Disposition: A | Discharge status: Home / Self Care | Payer: MEDICAID | Source: Ambulatory Visit | Attending: Family Medicine | Admitting: Family Medicine

## 2014-05-09 DIAGNOSIS — Z1231 Encounter for screening mammogram for malignant neoplasm of breast: Secondary | ICD-10-CM

## 2014-05-10 ENCOUNTER — Encounter: Payer: Self-pay | Admitting: Cardiology

## 2014-05-10 ENCOUNTER — Ambulatory Visit (INDEPENDENT_AMBULATORY_CARE_PROVIDER_SITE_OTHER): Payer: Self-pay | Admitting: Cardiology

## 2014-05-10 VITALS — BP 140/90 | HR 102 | Ht 62.0 in | Wt 189.0 lb

## 2014-05-10 DIAGNOSIS — I2510 Atherosclerotic heart disease of native coronary artery without angina pectoris: Secondary | ICD-10-CM

## 2014-05-10 DIAGNOSIS — I10 Essential (primary) hypertension: Secondary | ICD-10-CM

## 2014-05-10 DIAGNOSIS — R0600 Dyspnea, unspecified: Secondary | ICD-10-CM

## 2014-05-10 NOTE — Patient Instructions (Addendum)
Continue your current therapy and work on your exercise  I will see you in 4 months  You need follow up blood work with your primary care

## 2014-05-11 NOTE — Progress Notes (Signed)
[name] Date of Birth: [birth year] Medical Record #[id number]  History of Present Illness: [name] is seen today for follow up CAD and dyspnea. She presented in November 2015 with an inferior STEMI and underwent emergent stenting of the RCA with a DES. She also had an ulcerated plaque in the mid LAD. She underwent repeat Cath which showed significant healing of the lesion in the LAD which was nonobstructive. Because of dyspnea she was switched from Brilinta to Effient. Since DC she has continued to complain of dyspnea. She gets out of breath with any activity. She does walk on a treadmill twice a day for 8 minutes and thinks this is about her limit. She had a follow up [place] on 04/03/15 which was normal. Normal EF. She has no cough. No edema. Denies orthopnea or PND. She also notes sugars are up and down. Weight is down 3 lbs. She has bad memory problems and reports she is going to see Neurology in March. On her last visit her Coreg was increased for heart racing and these symptoms have improved.     Medication List       This list is accurate as of: 05/10/14 11:59 PM.  Always use your most recent med list.               alprazolam 2 MG tablet  Commonly known as:  XANAX  Take 2 mg by mouth 2 (two) times daily as needed for sleep or anxiety.     amitriptyline 50 MG tablet  Commonly known as:  ELAVIL  Take 50 mg by mouth at bedtime.     aspirin EC 81 MG tablet  Take 1 tablet (81 mg total) by mouth daily.     carvedilol 6.25 MG tablet  Commonly known as:  COREG  Take 2 tablets (12.5 mg total) by mouth 2 (two) times daily with a meal.     dexlansoprazole 60 MG capsule  Commonly known as:  DEXILANT  Take 60 mg by mouth daily.     LANTUS SOLOSTAR 100 UNIT/ML Solostar Pen  Generic drug:  Insulin Glargine  Inject 50 Units into the skin 2 (two) times daily.     nitroGLYCERIN 0.4 MG SL tablet  Commonly known as:  NITROSTAT  Place 1 tablet (0.4 mg total) under the  tongue every 5 (five) minutes as needed for chest pain.     NOVOLOG 100 UNIT/ML injection  Generic drug:  insulin aspart  Inject 10 Units into the skin 2 (two) times daily. 10 units twice a day     prasugrel 10 MG Tabs tablet  Commonly known as:  EFFIENT  Take 1 tablet (10 mg total) by mouth daily.     pravastatin 80 MG tablet  Commonly known as:  PRAVACHOL  Take 1 tablet (80 mg total) by mouth daily.     pregabalin 300 MG capsule  Commonly known as:  LYRICA  Take 1 capsule (300 mg total) by mouth 2 (two) times daily.     ranitidine 150 MG tablet  Commonly known as:  ZANTAC  Take 150 mg by mouth as needed. Alternate with pepcid     sitaGLIPtin-metformin 50-1000 MG per tablet  Commonly known as:  JANUMET  Take 1 tablet by mouth 2 (two) times daily with a meal.     traMADol 50 MG tablet  Commonly known as:  ULTRAM  Take 1 tablet (50 mg total) by mouth every 6 (six) hours as needed.  Allergies  Allergen Reactions  . Aspartame And Phenylalanine Shortness Of Breath and Swelling    Throat swells  ANY ARTIFICAL SWEETNERS  . Toradol [Ketorolac Tromethamine] Other (See Comments)    Face & neck flushed red & pt felt very hot after IV adm.  . Sucralose Rash    Pt.s face broke out in rash after drinking Breeza.  Pt. Claims it is from the artificial sweetener.     . Dilaudid [Hydromorphone Hcl]     Itch  . Hydrocodone Itching  . Oxycodone Rash    itching    Past Medical History  Diagnosis Date  . Diabetes mellitus   . Dyslipidemia   . Clostridium difficile colitis   . Sea-blue histiocyte syndrome     a. s/p splenectomy.  . Leukocytosis     Dr Tressie Stalker  . Fibromyalgia   . Neuropathy, peripheral   . Upper respiratory infection 2/15    states has dry non productive cough since then- "tickle in my throat" no fever  . Anxiety   . GERD (gastroesophageal reflux disease)   . H/O hiatal hernia   . CAD (coronary artery disease)     a.  Inferior STEMI / CAD s/p DES  to prox RCA 03/03/14, staged cath 03/05/14 for LAD showing significantly improved stenosis of mid LAD with resolution of prior thrombus.  . Sinus bradycardia     a. During 02/2014 admit, not on BB due to this.  . Ischemic cardiomyopathy     a. EF 45-50% by echo 03/04/14 with suspected mild acute diastolic CHF s/p dose of IV Lasix.  Marland Kitchen NASH (nonalcoholic steatohepatitis)     Past Surgical History  Procedure Laterality Date  . Splenectomy    . Tonsillectomy    . Abdominal hysterectomy      partial  . Esophagogastroduodenoscopy  08/27/2011    Rourk-reactive gastropathy, normal, patent esophagus. Stomach empty. Multiple linear antral erosions. No ulcer or infiltrating process. Small hiatal hernia. Patent pylorus. Normal first and second portion of the duodenum  . Colonoscopy  12/07/2011    Rourk-normal rectum,, colon and terminal ileum  . Shoulder arthroscopy with open rotator cuff repair Left 03/02/2013    Procedure: LEFT SHOULDER ARTHROSCOPY, SUBACROMIAL DECOMPRESSION, MANIPULATION UNDER ANESTHESIA;  Surgeon: [name], MD;  Location: [facility];  Service: Orthopedics;  Laterality: Left;  . Shoulder open rotator cuff repair Left 07/27/2013    Procedure: LEFT MINI OPEN ROTATOR CUFF REPAIR ;  Surgeon: [name], MD;  Location: [facility];  Service: Orthopedics;  Laterality: Left;  . Cardiac catheterization  03/05/14    improved LAD  . Coronary angioplasty with stent placement  03/03/14    DES to RCA,  ulcerated plaque in LAD  . Left heart cath Bilateral 03/03/2014    Procedure: LEFT HEART CATH;  Surgeon: Tres Grzywacz M Martinique, MD;  Location: [facility] CATH LAB;  Service: Cardiovascular;  Laterality: Bilateral;  . Percutaneous coronary stent intervention (pci-s) N/A 03/05/2014    Procedure: PERCUTANEOUS CORONARY STENT INTERVENTION (PCI-S);  Surgeon: Troy Sine, MD;  Location: [facility] CATH LAB;  Service: Cardiovascular;  Laterality: N/A;    History   Social History  . Marital Status: Legally Separated      Spouse Name: N/A    Number of Children: 2  . Years of Education: N/A   Occupational History  . homemaker    Social History Main Topics  . Smoking status: Former Smoker -- 0.25 packs/day for 30 years    Types: Cigarettes  Quit date: 04/20/2006  . Smokeless tobacco: Never Used  . Alcohol Use: No  . Drug Use: No  . Sexual Activity: Yes    Birth Control/ Protection: None   Other Topics Concern  . None   Social History Narrative   Lives w/ husband & step daughter   Also has grandchildren from her son & daughter    Family History  Problem Relation Age of Onset  . Colon cancer Neg Hx   . Arthritis Mother   . Diabetes Father   . Kidney disease Father     Review of Systems: As noted in HPI.  All other systems were reviewed and are negative.  Physical Exam: BP 140/90 mmHg  Pulse 102  Ht 5\' 2"  (1.575 m)  Wt 189 lb (85.73 kg)  BMI 34.56 kg/m2 Filed Weights   05/10/14 1630  Weight: 189 lb (85.73 kg)  GENERAL:  Well appearing WF in NAD HEENT:  PERRL, EOMI, sclera are clear. Oropharynx is clear. NECK:  No jugular venous distention, carotid upstroke brisk and symmetric, no bruits, no thyromegaly or adenopathy LUNGS:  Clear to auscultation bilaterally CHEST:  Unremarkable HEART:  RRR,  PMI not displaced or sustained,S1 and S2 within normal limits, no S3, no S4: no clicks, no rubs, no murmurs ABD:  Soft, nontender. BS +, no masses or bruits. No hepatomegaly, no splenomegaly EXT:  2 + pulses throughout, no edema, no cyanosis no clubbing SKIN:  Warm and dry.  No rashes NEURO:  Alert and oriented x 3. Cranial nerves II through XII intact. PSYCH:  Cognitively intact    LABORATORY DATA:   Assessment / Plan: 1. Dyspnea on exertion. Etiology is unclear. No evidence of ischemia on follow up Myoview and EF is normal. Recent Hgb was normal. She is not smoking. It is possible she could be intolerant of Coreg especially since it is nonselective. I suggested switching to a more  selective beta blocker but she wants to continue her current therapy for now. I have encouraged her to exercise regulary.   2. CAD s/p inferior STEMI with DES of RCA in November 2015. No other obstructive disease. Myoview study Normal. Continue DAPT for one year.   3. Hyperlipidemia. I have requested her primary care to check complete chemistries, A1c and lipid panel with next visit.   4. DM type 2, uncontrolled. Followed by primary care.  5. HTN under fair control.  I will follow up in 4 months.

## 2014-05-17 ENCOUNTER — Other Ambulatory Visit: Payer: Self-pay | Admitting: Family Medicine

## 2014-05-17 DIAGNOSIS — R928 Other abnormal and inconclusive findings on diagnostic imaging of breast: Secondary | ICD-10-CM

## 2014-05-21 ENCOUNTER — Telehealth: Payer: Self-pay | Admitting: Cardiology

## 2014-05-21 NOTE — Telephone Encounter (Signed)
Pt hip is hurting,she can not hardly walk. She wants to know what can she take over the counter for that.She is on medicine so she does not know what she can take. If she is not there,she says to please tell her mother([name]).Marland Kitchen

## 2014-05-21 NOTE — Telephone Encounter (Signed)
I advised to take tylenol arthritis.  I advised to avoid ibuprofen containing products.

## 2014-05-23 ENCOUNTER — Other Ambulatory Visit (HOSPITAL_COMMUNITY): Payer: Self-pay | Admitting: *Deleted

## 2014-05-23 DIAGNOSIS — R928 Other abnormal and inconclusive findings on diagnostic imaging of breast: Secondary | ICD-10-CM

## 2014-05-28 ENCOUNTER — Other Ambulatory Visit: Payer: Self-pay

## 2014-06-07 ENCOUNTER — Ambulatory Visit
Admission: RE | Admit: 2014-06-07 | Discharge: 2014-06-07 | Disposition: A | Discharge status: Home / Self Care | Payer: No Typology Code available for payment source | Source: Ambulatory Visit | Attending: Obstetrics and Gynecology | Admitting: Obstetrics and Gynecology

## 2014-06-07 ENCOUNTER — Encounter (HOSPITAL_COMMUNITY): Payer: Self-pay

## 2014-06-07 ENCOUNTER — Ambulatory Visit (HOSPITAL_COMMUNITY)
Admission: RE | Admit: 2014-06-07 | Discharge: 2014-06-07 | Disposition: A | Discharge status: Home / Self Care | Payer: Self-pay | Source: Ambulatory Visit | Attending: Obstetrics and Gynecology | Admitting: Obstetrics and Gynecology

## 2014-06-07 VITALS — BP 126/82 | Temp 98.3°F | Ht 62.0 in | Wt 194.4 lb

## 2014-06-07 DIAGNOSIS — Z1239 Encounter for other screening for malignant neoplasm of breast: Secondary | ICD-10-CM

## 2014-06-07 DIAGNOSIS — R928 Other abnormal and inconclusive findings on diagnostic imaging of breast: Secondary | ICD-10-CM

## 2014-06-07 NOTE — Progress Notes (Signed)
Patient referred to [facility] by the [facility] due to needing additional imaging of bilateral breasts. Screening mammogram completed 05/09/2014 at [place].  Pap Smear:  Pap smear not completed today. Last Pap smear was in 1994 and normal per patient. Per patient has no history of an abnormal Pap smear. Patient has a history of a hysterectomy in 1994 for endometriosis and AUB. No further Pap smears needed due to patient's history of a hysterectomy for benign reasons per ACOG and BCCCP guidelines. No Pap smear results in EPIC.  Physical exam: Breasts Breasts symmetrical. No skin abnormalities bilateral breasts. No nipple retraction bilateral breasts. No nipple discharge bilateral breasts. No lymphadenopathy. No lumps palpated bilateral breasts. No complaints of pain or tenderness on exam. Referred patient to the [facility] for bilateral diagnostic mammogram per recommendation. Appointment scheduled for Thursday, June 07, 2014 at 1520.  Pelvic/Bimanual No Pap smear completed today since patient has a history of a hysterectomy for benign reasons. Pap smear not indicated per BCCCP guidelines.

## 2014-06-07 NOTE — Patient Instructions (Signed)
Educational materials given on self breast awareness. Explained to [employer] that she did not need a Pap smear today due to patient has a history of a hysterectomy for benign reasons. Let patient know that she does not need any further Pap smears due to her history of a hysterectomy for benign reasons. Referred patient to the [facility] for bilateral diagnostic mammogram per recommendation. Appointment scheduled for Thursday, June 07, 2014 at 1520. Patient aware of appointment and will be there.  Charese S Dever verbalized understanding.  Joua Bake, Arvil Chaco, RN 2:02 PM

## 2014-06-20 ENCOUNTER — Ambulatory Visit (INDEPENDENT_AMBULATORY_CARE_PROVIDER_SITE_OTHER): Payer: Self-pay | Admitting: Neurology

## 2014-06-20 ENCOUNTER — Encounter: Payer: Self-pay | Admitting: Neurology

## 2014-06-20 VITALS — BP 116/82 | HR 88 | Resp 16 | Ht 62.0 in | Wt 190.0 lb

## 2014-06-20 DIAGNOSIS — F32A Depression, unspecified: Secondary | ICD-10-CM

## 2014-06-20 DIAGNOSIS — F329 Major depressive disorder, single episode, unspecified: Secondary | ICD-10-CM

## 2014-06-20 DIAGNOSIS — R413 Other amnesia: Secondary | ICD-10-CM

## 2014-06-20 MED ORDER — AMITRIPTYLINE HCL 50 MG PO TABS: ORAL_TABLET | ORAL | Status: DC

## 2014-06-20 NOTE — Patient Instructions (Signed)
Memory changes can be from many different causes. Patients can have memory changes after a heart attack. If there are any abnormalities in your thyroid or B12 levels, this can also contribute to memory change. Anxiety and depression can cause memory loss. If these are contributing in any way, I would recommend checking your thyroid and B12 levels and treatment of anxiety and depression. For memory loss primarily from the heart attack, training modalities ("retraining the brain") with cognitive behavioral therapy may help.   Physical exercise and brain stimulation exercises (crossword puzzles, word search, reading) are important for brain health. It is also important to control blood sugar, blood pressure, and cholesterol.   Increase your amitriptyline 50mg : take 1-1/2 tablet at bedtime for 2 weeks, then increase to 2 tablets at bedtime. Monitor for drowsiness with medication changes.

## 2014-06-20 NOTE — Progress Notes (Signed)
NEUROLOGY CONSULTATION NOTE  [name] MRN: [id number] DOB: Nov 23, 1965  Referring provider: Everrett Coombe, FNP Primary care provider: Everrett Coombe, FNP  Reason for consult:  Memory loss  Thank you for your kind referral of [name] for consultation of the above symptoms. Although her history is well known to you, please allow me to reiterate it for the purpose of our medical record. Records and images were personally reviewed where available.  HISTORY OF PRESENT ILLNESS: This is a 49 year old right-handed woman with a history of hypertension, hyperlipidemia, diabetes, CAD s/p MI, fibromyalgia, peripheral neuropathy, presenting for significant memory changes since her heart attack in November 2015. She was in her usual state of health until 03/03/14 when she started having chest pain and dyspnea, admitted to [facility] where she underwent cardiac cath and was found occluded RCA, ulcerated plaque in the mi-LAD, and post-procedurally had hypotension and intermittent bradycardia. She was noted to have continued sense of breathlessness but not hypoxic or tachypneic. Since hospital admission, she has been having significant cognitive deficits. She is "just not remembering simple things that I should remember." She lives by herself and has gotten confused driving to her children's houses. She would get disoriented driving, one time she went to the mall and did not recall where she was. She has post-it notes on her car if she does not recall where she is instructing her to enter Home on her GPS. She has burned food on the stove. Her mother comes a couple of days a week to help her. She has forgotten to pay bills. She has a post-it on the lamp to remind her to look at her calendar when she wakes up. She has multiple notes on her phone. She used to be on top of everything in the past, denying and prior memory deficits.   She denies any family history of memory loss. No prior history of  head injury. She has "horrible sleep," with an average of 3 hours of sleep. She denies sleep difficulties prior to the heart attack, but then reported taking amitriptyline to help her rest beter from the fibromyalgia. She has been having headaches 1-2 times a week, with pounding over the bilateral temporal regions, associated photophobia, no nausea/vomiting. She gets dizzy when she stands up quickly or bends down/turns around. She "falls all the time" due to her neuropathy and fibromyalgia, with her legs giving out on her. She has been taking Lyrica 300mg  BID for the past 2-3 years for these. She has been having a lot of anxiety since the heart attack, with panic attacks 3-4 times a week. She takes Xanax 1/2 tablet BID. She has noticed a change in her taste, food does not taste as good. She denies any neck or back pain, bowel/bladder dysfunction has has had chronic intermittent leg numbness, no arm symptoms. She has had occasional difficulty swallowing since the heart attack, feeling like something is stuck in her throat.   Laboratory Data: Lab Results  Component Value Date   WBC 12.5* 03/06/2014   HGB 13.0 03/06/2014   HCT 38.4 03/06/2014   MCV 96.5 03/06/2014   PLT 309 03/06/2014     Chemistry      Component Value Date/Time   NA 136* 03/06/2014 0359   NA 140 04/12/2012 1459   K 3.9 03/06/2014 0359   K 4.3 04/12/2012 1459   CL 101 03/06/2014 0359   CL 104 04/12/2012 1459   CO2 20 03/06/2014 0359   BUN 10  03/06/2014 0359   BUN 8 04/12/2012 1459   CREATININE 0.51 03/06/2014 0359   CREATININE 0.63 12/02/2012 1114      Component Value Date/Time   CALCIUM 8.7 03/06/2014 0359   CALCIUM 9.3 04/12/2012 1459   ALKPHOS 87 03/06/2014 0359   ALKPHOS 110 04/12/2012 1459   AST 22 03/06/2014 0359   AST 28 04/12/2012 1459   ALT 19 03/06/2014 0359   BILITOT 0.4 03/06/2014 0359   BILITOT 0.3 04/12/2012 1459      PAST MEDICAL HISTORY: Past Medical History  Diagnosis Date  . Diabetes mellitus    . Dyslipidemia   . Clostridium difficile colitis   . Sea-blue histiocyte syndrome     a. s/p splenectomy.  . Leukocytosis     Dr Tressie Stalker  . Fibromyalgia   . Neuropathy, peripheral   . Upper respiratory infection 2/15    states has dry non productive cough since then- "tickle in my throat" no fever  . Anxiety   . GERD (gastroesophageal reflux disease)   . H/O hiatal hernia   . CAD (coronary artery disease)     a.  Inferior STEMI / CAD s/p DES to prox RCA 03/03/14, staged cath 03/05/14 for LAD showing significantly improved stenosis of mid LAD with resolution of prior thrombus.  . Sinus bradycardia     a. During 02/2014 admit, not on BB due to this.  . Ischemic cardiomyopathy     a. EF 45-50% by echo 03/04/14 with suspected mild acute diastolic CHF s/p dose of IV Lasix.  Marland Kitchen NASH (nonalcoholic steatohepatitis)     PAST SURGICAL HISTORY: Past Surgical History  Procedure Laterality Date  . Splenectomy    . Tonsillectomy    . Abdominal hysterectomy      partial  . Esophagogastroduodenoscopy  08/27/2011    Rourk-reactive gastropathy, normal, patent esophagus. Stomach empty. Multiple linear antral erosions. No ulcer or infiltrating process. Small hiatal hernia. Patent pylorus. Normal first and second portion of the duodenum  . Colonoscopy  12/07/2011    Rourk-normal rectum,, colon and terminal ileum  . Shoulder arthroscopy with open rotator cuff repair Left 03/02/2013    Procedure: LEFT SHOULDER ARTHROSCOPY, SUBACROMIAL DECOMPRESSION, MANIPULATION UNDER ANESTHESIA;  Surgeon: [name], MD;  Location: [facility];  Service: Orthopedics;  Laterality: Left;  . Shoulder open rotator cuff repair Left 07/27/2013    Procedure: LEFT MINI OPEN ROTATOR CUFF REPAIR ;  Surgeon: [name], MD;  Location: [facility];  Service: Orthopedics;  Laterality: Left;  . Cardiac catheterization  03/05/14    improved LAD  . Coronary angioplasty with stent placement  03/03/14    DES to RCA,  ulcerated  plaque in LAD  . Left heart cath Bilateral 03/03/2014    Procedure: LEFT HEART CATH;  Surgeon: Peter M Martinique, MD;  Location: [facility] CATH LAB;  Service: Cardiovascular;  Laterality: Bilateral;  . Percutaneous coronary stent intervention (pci-s) N/A 03/05/2014    Procedure: PERCUTANEOUS CORONARY STENT INTERVENTION (PCI-S);  Surgeon: Troy Sine, MD;  Location: [facility] CATH LAB;  Service: Cardiovascular;  Laterality: N/A;    MEDICATIONS: Current Outpatient Prescriptions on File Prior to Visit  Medication Sig Dispense Refill  . alprazolam (XANAX) 2 MG tablet Take 2 mg by mouth 2 (two) times daily as needed for sleep or anxiety.    Marland Kitchen aspirin EC 81 MG tablet Take 1 tablet (81 mg total) by mouth daily. 30 tablet 11  . carvedilol (COREG) 6.25 MG tablet Take 2 tablets (12.5 mg total)  by mouth 2 (two) times daily with a meal. 120 tablet 3  . dexlansoprazole (DEXILANT) 60 MG capsule Take 60 mg by mouth daily.    . insulin aspart (NOVOLOG) 100 UNIT/ML injection Inject 10 Units into the skin 2 (two) times daily. 10 units twice a day    . Insulin Glargine (LANTUS SOLOSTAR) 100 UNIT/ML Solostar Pen Inject 50 Units into the skin 2 (two) times daily.    . prasugrel (EFFIENT) 10 MG TABS tablet Take 1 tablet (10 mg total) by mouth daily. 30 tablet 10  . pravastatin (PRAVACHOL) 80 MG tablet Take 1 tablet (80 mg total) by mouth daily. 30 tablet 6  . pregabalin (LYRICA) 300 MG capsule Take 1 capsule (300 mg total) by mouth 2 (two) times daily. 60 capsule 3  . sitaGLIPtin-metformin (JANUMET) 50-1000 MG per tablet Take 1 tablet by mouth 2 (two) times daily with a meal.    . traMADol (ULTRAM) 50 MG tablet Take 1 tablet (50 mg total) by mouth every 6 (six) hours as needed. 60 tablet 1  . nitroGLYCERIN (NITROSTAT) 0.4 MG SL tablet Place 1 tablet (0.4 mg total) under the tongue every 5 (five) minutes as needed for chest pain. (Patient not taking: Reported on 06/20/2014) 25 tablet 3   No current facility-administered medications  on file prior to visit.    ALLERGIES: Allergies  Allergen Reactions  . Aspartame And Phenylalanine Shortness Of Breath and Swelling    Throat swells  ANY ARTIFICAL SWEETNERS  . Toradol [Ketorolac Tromethamine] Other (See Comments)    Face & neck flushed red & pt felt very hot after IV adm.  . Sucralose Rash    Pt.s face broke out in rash after drinking Breeza.  Pt. Claims it is from the artificial sweetener.     . Dilaudid [Hydromorphone Hcl]     Itch  . Hydrocodone Itching  . Oxycodone Rash    itching    FAMILY HISTORY: Family History  Problem Relation Age of Onset  . Colon cancer Neg Hx   . Arthritis Mother   . Diabetes Father   . Kidney disease Father     SOCIAL HISTORY: History   Social History  . Marital Status: Legally Separated    Spouse Name: N/A  . Number of Children: 2  . Years of Education: N/A   Occupational History  . homemaker    Social History Main Topics  . Smoking status: Former Smoker -- 0.25 packs/day for 30 years    Types: Cigarettes    Quit date: 04/20/2006  . Smokeless tobacco: Never Used  . Alcohol Use: 0.0 oz/week    0 Standard drinks or equivalent per week     Comment: rarely  . Drug Use: No  . Sexual Activity: Yes    Birth Control/ Protection: None, Surgical   Other Topics Concern  . Not on file   Social History Narrative   Lives w/ husband & step daughter   Also has grandchildren from her son & daughter    REVIEW OF SYSTEMS: Constitutional: No fevers, chills, or sweats, no generalized fatigue, change in appetite Eyes: No visual changes, double vision, eye pain Ear, nose and throat: No hearing loss, ear pain, nasal congestion, sore throat Cardiovascular: No chest pain, palpitations Respiratory:  No shortness of breath at rest or with exertion, wheezes GastrointestinaI: No nausea, vomiting, diarrhea, abdominal pain, fecal incontinence Genitourinary:  No dysuria, urinary retention or frequency Musculoskeletal:  No neck pain,  back pain Integumentary: No rash, pruritus,  skin lesions Neurological: as above Psychiatric: No depression, +insomnia, anxiety Endocrine: No palpitations, fatigue, diaphoresis, mood swings, change in appetite, change in weight, increased thirst Hematologic/Lymphatic:  No anemia, purpura, petechiae. Allergic/Immunologic: no itchy/runny eyes, nasal congestion, recent allergic reactions, rashes  PHYSICAL EXAM: Filed Vitals:   06/20/14 1254  BP: 116/82  Pulse: 88  Resp: 16   General: No acute distress Head:  Normocephalic/atraumatic Eyes: Fundoscopic exam shows bilateral sharp discs, no vessel changes, exudates, or hemorrhages Neck: supple, no paraspinal tenderness, full range of motion Back: No paraspinal tenderness Heart: regular rate and rhythm Lungs: Clear to auscultation bilaterally. Vascular: No carotid bruits. Skin/Extremities: No rash, no edema Neurological Exam: Mental status: alert and oriented to person, place, and time, no dysarthria or aphasia, Fund of knowledge is appropriate.  Recent and remote memory are intact.  Attention and concentration are normal.    Able to name objects and repeat phrases. MMSE - Mini Mental State Exam 06/20/2014  Orientation to time 5  Orientation to Place 4  Registration 3  Attention/ Calculation 5  Recall 2  Language- name 2 objects 2  Language- repeat 1  Language- follow 3 step command 3  Language- read & follow direction 1  Write a sentence 1  Copy design 1  Total score 28   Cranial nerves: CN I: not tested CN II: pupils equal, round and reactive to light, visual fields intact, fundi unremarkable. CN III, IV, VI:  full range of motion, no nystagmus, no ptosis CN V: facial sensation intact CN VII: upper and lower face symmetric CN VIII: hearing intact to finger rub CN IX, X: gag intact, uvula midline CN XI: sternocleidomastoid and trapezius muscles intact CN XII: tongue midline Bulk & Tone: normal, no fasciculations. Motor: 5/5  throughout with no pronator drift. Sensation: decreased pin on left forearm and left LE, otherwise intact to light touch, cold, vibration and joint position sense.  No extinction to double simultaneous stimulation.  Romberg test negative Deep Tendon Reflexes: +1 throughout except for absent ankle jerks bilaterally, no ankle clonus Plantar responses: downgoing bilaterally Cerebellar: no incoordination on finger to nose, heel to shin. No dysdiadochokinesia Gait: narrow-based and steady, able to tandem walk adequately. Tremor: none  IMPRESSION: This is a 49 year old right-handed woman with vascular risk factors including hypertension, hyperlipidemia, diabetes, CAD s/p STEMI in November 2015, presenting for memory changes since her MI. She has had short-term memory difficulties which affect her daily living (driving, cooking, bill payments). MMSE today is normal 28/30. Neurological exam shows subjective decreased sensation on the left side. Head CT done for memory loss did not show any acute changes, no evidence of ischemic injury. She was noted to have hypotension and bradycardia on hospital admission and potentially had hypoxic encephalopathy. We discussed other causes of memory loss, TSH and B12 level will be checked. She is noted to be anxious and tearful today, we discussed pseudodementia and effects of mood on memory. She will increase amitriptyline at bedtime to help with sleep and hopefully with mood as well. We discussed the importance of controlling vascular risk factors, physical exercise, and brain stimulation exercises for brain health. She may benefit from cognitive behavioral therapy in the future. She will follow-up in 3 months.   Thank you for allowing me to participate in the care of this patient. Please do not hesitate to call for any questions or concerns.   Ellouise Newer, M.D.

## 2014-06-26 NOTE — Progress Notes (Signed)
Done

## 2014-09-13 ENCOUNTER — Encounter: Payer: Self-pay | Admitting: Cardiology

## 2014-09-13 ENCOUNTER — Ambulatory Visit (INDEPENDENT_AMBULATORY_CARE_PROVIDER_SITE_OTHER): Payer: Self-pay | Admitting: Cardiology

## 2014-09-13 VITALS — BP 150/90 | HR 78 | Ht 62.0 in | Wt 196.2 lb

## 2014-09-13 DIAGNOSIS — E1165 Type 2 diabetes mellitus with hyperglycemia: Secondary | ICD-10-CM

## 2014-09-13 DIAGNOSIS — I10 Essential (primary) hypertension: Secondary | ICD-10-CM

## 2014-09-13 DIAGNOSIS — IMO0002 Reserved for concepts with insufficient information to code with codable children: Secondary | ICD-10-CM

## 2014-09-13 DIAGNOSIS — R0600 Dyspnea, unspecified: Secondary | ICD-10-CM

## 2014-09-13 DIAGNOSIS — I2510 Atherosclerotic heart disease of native coronary artery without angina pectoris: Secondary | ICD-10-CM

## 2014-09-13 MED ORDER — NEBIVOLOL HCL 10 MG PO TABS: 10.0000 mg | ORAL_TABLET | Freq: Every day | ORAL | Status: DC

## 2014-09-13 NOTE — Addendum Note (Signed)
Addended by: Golden Hurter D on: 09/13/2014 02:14 PM   Modules accepted: Orders

## 2014-09-13 NOTE — Patient Instructions (Signed)
Stop Coreg.  Start Bystolic 10 mg daily instead.   Continue your other therapy  We will schedule you to see pulmonary for evaluation of your cough and shortness of breath.

## 2014-09-13 NOTE — Progress Notes (Signed)
[name] Date of Birth: [birth year] Medical Record #[id number]  History of Present Illness: [name] is seen today for follow up CAD and dyspnea. She presented in November 2015 with an inferior STEMI and underwent emergent stenting of the RCA with a DES. She also had an ulcerated plaque in the mid LAD. She underwent repeat Cath which showed significant healing of the lesion in the LAD which was nonobstructive. Because of dyspnea she was switched from Brilinta to Effient. Since DC she has continued to complain of dyspnea. She gets out of breath with activity. She does walk on a treadmill twice a day and has been able to increase this to 20 minutes bid.  She had a follow up [place] on 04/03/15 which was normal. Normal EF. She has a persistent dry cough. No edema. Denies orthopnea or PND. She also notes sugars are improved.     Medication List       This list is accurate as of: 09/13/14  2:05 PM.  Always use your most recent med list.               alprazolam 2 MG tablet  Commonly known as:  XANAX  Take 2 mg by mouth 2 (two) times daily as needed for sleep or anxiety.     amitriptyline 50 MG tablet  Commonly known as:  ELAVIL  Take 2 tablets at bedtime     aspirin EC 81 MG tablet  Take 1 tablet (81 mg total) by mouth daily.     dexlansoprazole 60 MG capsule  Commonly known as:  DEXILANT  Take 60 mg by mouth daily.     LANTUS SOLOSTAR 100 UNIT/ML Solostar Pen  Generic drug:  Insulin Glargine  Inject 50 Units into the skin 2 (two) times daily.     nebivolol 10 MG tablet  Commonly known as:  BYSTOLIC  Take 1 tablet (10 mg total) by mouth daily.     nitroGLYCERIN 0.4 MG SL tablet  Commonly known as:  NITROSTAT  Place 1 tablet (0.4 mg total) under the tongue every 5 (five) minutes as needed for chest pain.     NOVOLOG 100 UNIT/ML injection  Generic drug:  insulin aspart  Inject 10 Units into the skin 2 (two) times daily. 10 units twice a day     prasugrel 10 MG  Tabs tablet  Commonly known as:  EFFIENT  Take 1 tablet (10 mg total) by mouth daily.     pravastatin 80 MG tablet  Commonly known as:  PRAVACHOL  Take 1 tablet (80 mg total) by mouth daily.     pregabalin 300 MG capsule  Commonly known as:  LYRICA  Take 1 capsule (300 mg total) by mouth 2 (two) times daily.     sitaGLIPtin-metformin 50-1000 MG per tablet  Commonly known as:  JANUMET  Take 1 tablet by mouth 2 (two) times daily with a meal.     traMADol 50 MG tablet  Commonly known as:  ULTRAM  Take 1 tablet (50 mg total) by mouth every 6 (six) hours as needed.         Allergies  Allergen Reactions  . Aspartame And Phenylalanine Shortness Of Breath and Swelling    Throat swells  ANY ARTIFICAL SWEETNERS  . Toradol [Ketorolac Tromethamine] Other (See Comments)    Face & neck flushed red & pt felt very hot after IV adm.  . Sucralose Rash    Pt.s face broke out in rash after drinking  Breeza.  Pt. Claims it is from the artificial sweetener.     . Dilaudid [Hydromorphone Hcl]     Itch  . Hydrocodone Itching  . Oxycodone Rash    itching    Past Medical History  Diagnosis Date  . Diabetes mellitus   . Dyslipidemia   . Clostridium difficile colitis   . Sea-blue histiocyte syndrome     a. s/p splenectomy.  . Leukocytosis     Dr Tressie Stalker  . Fibromyalgia   . Neuropathy, peripheral   . Upper respiratory infection 2/15    states has dry non productive cough since then- "tickle in my throat" no fever  . Anxiety   . GERD (gastroesophageal reflux disease)   . H/O hiatal hernia   . CAD (coronary artery disease)     a.  Inferior STEMI / CAD s/p DES to prox RCA 03/03/14, staged cath 03/05/14 for LAD showing significantly improved stenosis of mid LAD with resolution of prior thrombus.  . Sinus bradycardia     a. During 02/2014 admit, not on BB due to this.  . Ischemic cardiomyopathy     a. EF 45-50% by echo 03/04/14 with suspected mild acute diastolic CHF s/p dose of IV Lasix.   Marland Kitchen NASH (nonalcoholic steatohepatitis)     Past Surgical History  Procedure Laterality Date  . Splenectomy    . Tonsillectomy    . Abdominal hysterectomy      partial  . Esophagogastroduodenoscopy  08/27/2011    Rourk-reactive gastropathy, normal, patent esophagus. Stomach empty. Multiple linear antral erosions. No ulcer or infiltrating process. Small hiatal hernia. Patent pylorus. Normal first and second portion of the duodenum  . Colonoscopy  12/07/2011    Rourk-normal rectum,, colon and terminal ileum  . Shoulder arthroscopy with open rotator cuff repair Left 03/02/2013    Procedure: LEFT SHOULDER ARTHROSCOPY, SUBACROMIAL DECOMPRESSION, MANIPULATION UNDER ANESTHESIA;  Surgeon: [name], MD;  Location: [facility];  Service: Orthopedics;  Laterality: Left;  . Shoulder open rotator cuff repair Left 07/27/2013    Procedure: LEFT MINI OPEN ROTATOR CUFF REPAIR ;  Surgeon: [name], MD;  Location: [facility];  Service: Orthopedics;  Laterality: Left;  . Cardiac catheterization  03/05/14    improved LAD  . Coronary angioplasty with stent placement  03/03/14    DES to RCA,  ulcerated plaque in LAD  . Left heart cath Bilateral 03/03/2014    Procedure: LEFT HEART CATH;  Surgeon: Elyan Vanwieren M Martinique, MD;  Location: [facility] CATH LAB;  Service: Cardiovascular;  Laterality: Bilateral;  . Percutaneous coronary stent intervention (pci-s) N/A 03/05/2014    Procedure: PERCUTANEOUS CORONARY STENT INTERVENTION (PCI-S);  Surgeon: Troy Sine, MD;  Location: [facility] CATH LAB;  Service: Cardiovascular;  Laterality: N/A;    History   Social History  . Marital Status: Legally Separated    Spouse Name: N/A  . Number of Children: 2  . Years of Education: N/A   Occupational History  . homemaker    Social History Main Topics  . Smoking status: Former Smoker -- 0.25 packs/day for 30 years    Types: Cigarettes    Quit date: 04/20/2006  . Smokeless tobacco: Never Used  . Alcohol Use: 0.0 oz/week    0 Standard  drinks or equivalent per week     Comment: rarely  . Drug Use: No  . Sexual Activity: Yes    Birth Control/ Protection: None, Surgical   Other Topics Concern  . None   Social History Narrative  Lives w/ husband & step daughter   Also has grandchildren from her son & daughter    Family History  Problem Relation Age of Onset  . Colon cancer Neg Hx   . Arthritis Mother   . Diabetes Father   . Kidney disease Father     Review of Systems: As noted in HPI.  All other systems were reviewed and are negative.  Physical Exam: BP 150/90 mmHg  Pulse 78  Ht 5\' 2"  (1.575 m)  Wt 88.996 kg (196 lb 3.2 oz)  BMI 35.88 kg/m2 Filed Weights   09/13/14 1334  Weight: 88.996 kg (196 lb 3.2 oz)  GENERAL:  Well appearing WF in NAD HEENT:  PERRL, EOMI, sclera are clear. Oropharynx is clear. NECK:  No jugular venous distention, carotid upstroke brisk and symmetric, no bruits, no thyromegaly or adenopathy LUNGS:  Few wheezes. Coughing constantly.  CHEST:  Unremarkable HEART:  RRR,  PMI not displaced or sustained,S1 and S2 within normal limits, no S3, no S4: no clicks, no rubs, no murmurs ABD:  Soft, nontender. BS +, no masses or bruits. No hepatomegaly, no splenomegaly EXT:  2 + pulses throughout, no edema, no cyanosis no clubbing SKIN:  Warm and dry.  No rashes NEURO:  Alert and oriented x 3. Cranial nerves II through XII intact. PSYCH:  Cognitively intact    LABORATORY DATA: From January 2016. A1c 10.9. Glucose 358. Other chemistries and TSH normal. Cholesterol 191, trig- 206, HDL 38, LDL 112.   Assessment / Plan: 1. Dyspnea on exertion. Etiology is unclear. No evidence of ischemia on follow up Myoview and EF is normal. Recent Hgb was normal. She has a prior history of smoking. Not on an ACEi and is actively treated for GERD with Dexilant. Will switch Coreg to Bystolic 10 mg daily. Refer to pulmonary for evaluation.  2. CAD s/p inferior STEMI with DES of RCA in November 2015. No other  obstructive disease. Myoview study Normal. Continue DAPT for one year.   3. Hyperlipidemia. Scheduled for repeat lab tests with primary care.   4. DM type 2, uncontrolled. Followed by primary care.  5. HTN under fair control.  I will follow up in 6 months.

## 2014-09-19 ENCOUNTER — Ambulatory Visit
Admission: RE | Admit: 2014-09-19 | Discharge: 2014-09-19 | Disposition: A | Discharge status: Home / Self Care | Payer: MEDICAID | Source: Ambulatory Visit | Attending: Pulmonary Disease | Admitting: Pulmonary Disease

## 2014-09-19 ENCOUNTER — Ambulatory Visit (INDEPENDENT_AMBULATORY_CARE_PROVIDER_SITE_OTHER): Payer: Self-pay | Admitting: Pulmonary Disease

## 2014-09-19 ENCOUNTER — Encounter: Payer: Self-pay | Admitting: Pulmonary Disease

## 2014-09-19 ENCOUNTER — Ambulatory Visit
Admission: RE | Admit: 2014-09-19 | Discharge: 2014-09-19 | Disposition: A | Discharge status: Home / Self Care | Payer: Self-pay | Source: Ambulatory Visit | Attending: Pulmonary Disease | Admitting: Pulmonary Disease

## 2014-09-19 VITALS — BP 118/78 | HR 73 | Ht 62.0 in | Wt 199.0 lb

## 2014-09-19 DIAGNOSIS — R05 Cough: Secondary | ICD-10-CM

## 2014-09-19 DIAGNOSIS — R059 Cough, unspecified: Secondary | ICD-10-CM | POA: Insufficient documentation

## 2014-09-19 MED ORDER — BENZONATATE 200 MG PO CAPS: 200.0000 mg | ORAL_CAPSULE | Freq: Three times a day (TID) | ORAL | Status: DC | PRN

## 2014-09-19 NOTE — Progress Notes (Signed)
Subjective:    Patient ID: [name], female    DOB: 1965/06/17, 49 y.o.   MRN: [id number]  HPI Chief Complaint  Patient presents with  . Advice Only    Referred by Dr. Martinique: Pt had heart attack in Nov 2015. She has cough with sl. mucus production, wheezing and chest tightness with coughing spells. Pt also has sob with and without exertion..   This is a very pleasant 49 year old female who unfortunately had a heart attack in November 2015. She says that ever since then she's had a cough. She has some shortness of breath with and without exertion but primarily her problem is cough. She says that she does not have chest pain anymore when she gets shortness of breath. She has no history of wheezing or chest tightness. She had a normal childhood without respiratory illnesses. She smoked one pack of cigarettes daily for 30 years and quit in 2009. She does not use inhaled therapies. She has been on an ACE inhibitor at some point in the last 5 years but from the best she can tell and from what I can tell the last time she took one was in 2014.  Since November 2015 the cough is been nearly continuous. She says it steadily worse when she lies flat. Rarely productive of mucus. It is much worse at night. She says that she sometimes chokes on her food because of coughing but that she doesn't choke and then have a coughing spell afterwards she says that talking makes the coughing much worse. She does not have postnasal drip. She is to have acid reflux but she says that this is been much better controlled since taking a proton pump inhibitor.  Past Medical History  Diagnosis Date  . Diabetes mellitus   . Dyslipidemia   . Clostridium difficile colitis   . Sea-blue histiocyte syndrome     a. s/p splenectomy.  . Leukocytosis     Dr Tressie Stalker  . Fibromyalgia   . Neuropathy, peripheral   . Upper respiratory infection 2/15    states has dry non productive cough since then- "tickle in my throat" no  fever  . Anxiety   . GERD (gastroesophageal reflux disease)   . H/O hiatal hernia   . CAD (coronary artery disease)     a.  Inferior STEMI / CAD s/p DES to prox RCA 03/03/14, staged cath 03/05/14 for LAD showing significantly improved stenosis of mid LAD with resolution of prior thrombus.  . Sinus bradycardia     a. During 02/2014 admit, not on BB due to this.  . Ischemic cardiomyopathy     a. EF 45-50% by echo 03/04/14 with suspected mild acute diastolic CHF s/p dose of IV Lasix.  Marland Kitchen NASH (nonalcoholic steatohepatitis)      Family History  Problem Relation Age of Onset  . Colon cancer Neg Hx   . Arthritis Mother   . Diabetes Father   . Kidney disease Father      History   Social History  . Marital Status: Legally Separated    Spouse Name: N/A  . Number of Children: 2  . Years of Education: N/A   Occupational History  . homemaker    Social History Main Topics  . Smoking status: Former Smoker -- 0.25 packs/day for 30 years    Types: Cigarettes    Quit date: 04/20/2006  . Smokeless tobacco: Never Used  . Alcohol Use: 0.0 oz/week    0 Standard drinks or equivalent  per week     Comment: rarely  . Drug Use: No  . Sexual Activity: Yes    Birth Control/ Protection: None, Surgical   Other Topics Concern  . Not on file   Social History Narrative   Lives w/ husband & step daughter   Also has grandchildren from her son & daughter     Allergies  Allergen Reactions  . Aspartame And Phenylalanine Shortness Of Breath and Swelling    Throat swells  ANY ARTIFICAL SWEETNERS  . Toradol [Ketorolac Tromethamine] Other (See Comments)    Face & neck flushed red & pt felt very hot after IV adm.  . Sucralose Rash    Pt.s face broke out in rash after drinking Breeza.  Pt. Claims it is from the artificial sweetener.     . Dilaudid [Hydromorphone Hcl]     Itch  . Hydrocodone Itching  . Oxycodone Rash    itching     Outpatient Prescriptions Prior to Visit  Medication Sig  Dispense Refill  . alprazolam (XANAX) 2 MG tablet Take 2 mg by mouth 2 (two) times daily as needed for sleep or anxiety.    Marland Kitchen amitriptyline (ELAVIL) 50 MG tablet Take 2 tablets at bedtime 60 tablet 6  . aspirin EC 81 MG tablet Take 1 tablet (81 mg total) by mouth daily. 30 tablet 11  . dexlansoprazole (DEXILANT) 60 MG capsule Take 60 mg by mouth daily.    . insulin aspart (NOVOLOG) 100 UNIT/ML injection Inject 10 Units into the skin 2 (two) times daily. 10 units twice a day    . Insulin Glargine (LANTUS SOLOSTAR) 100 UNIT/ML Solostar Pen Inject 50 Units into the skin 2 (two) times daily.    . nebivolol (BYSTOLIC) 10 MG tablet Take 1 tablet (10 mg total) by mouth daily. 30 tablet 11  . nitroGLYCERIN (NITROSTAT) 0.4 MG SL tablet Place 1 tablet (0.4 mg total) under the tongue every 5 (five) minutes as needed for chest pain. 25 tablet 3  . prasugrel (EFFIENT) 10 MG TABS tablet Take 1 tablet (10 mg total) by mouth daily. 30 tablet 10  . pravastatin (PRAVACHOL) 80 MG tablet Take 1 tablet (80 mg total) by mouth daily. 30 tablet 6  . pregabalin (LYRICA) 300 MG capsule Take 1 capsule (300 mg total) by mouth 2 (two) times daily. 60 capsule 3  . sitaGLIPtin-metformin (JANUMET) 50-1000 MG per tablet Take 1 tablet by mouth 2 (two) times daily with a meal.    . traMADol (ULTRAM) 50 MG tablet Take 1 tablet (50 mg total) by mouth every 6 (six) hours as needed. 60 tablet 1   No facility-administered medications prior to visit.       Review of Systems  Constitutional: Negative.   HENT: Negative.   Eyes: Negative.   Respiratory: Positive for cough, chest tightness, shortness of breath and wheezing. Negative for apnea and choking.   Cardiovascular: Positive for chest pain. Negative for palpitations and leg swelling.  Gastrointestinal: Negative.   Endocrine: Negative.   Genitourinary: Negative.   Allergic/Immunologic: Negative.   Neurological: Negative.   Hematological: Negative.     Psychiatric/Behavioral: Negative.        Objective:   Physical Exam Filed Vitals:   09/19/14 1539  BP: 118/78  Pulse: 73  Height: 5\' 2"  (1.575 m)  Weight: 199 lb (90.266 kg)  SpO2: 96%   RA  Gen: frequent cough, well appearing, no acute distress HENT: NCAT, OP clear, neck supple without masses Eyes: PERRL, EOMi  Lymph: no cervical lymphadenopathy PULM: CTA B CV: RRR, no mgr, no JVD GI: BS+, soft, nontender, no hsm Derm: no rash or skin breakdown MSK: normal bulk and tone Neuro: A&Ox4, CN II-XII intact, strength 5/5 in all 4 extremities Psyche: normal mood and affect  02/2014 CXR > normal lung parenchyma       Assessment & Plan:  Cough She has a near continuous cough which has been bothersome since November 2015 when she had her non-ST elevation MI. The cough is worse when lying flat which makes me think of postnasal drip and acid reflux. She has known heartburn and given her obesity acid reflux is the most likely cause. I cannot tell where she has been treated with an ACE inhibitor in the last 12 months, though she did have with senna pill on her medication list in 2014. Her chest x-ray in November 2015 did not show evidence of a pulmonary parenchymal disease per Myra reviewed. I reviewed the cardiology and primary care notes and it does not look as if anyone else has treated the cough recently.  In summary I think the cough is due to acid reflux, possibly postnasal drip and definitely ongoing laryngeal irritation from recurrent cough.  Plan: Chest x-ray Voice rest encouraged Acid reflux lifestyle modifications recommended Take a proton pump inhibitor in the morning and H2 blocker at night Take an over-the-counter decongestion/and a histamine such as Zyrtec and Nasacort Follow-up 4 weeks      Current outpatient prescriptions:  .  alprazolam (XANAX) 2 MG tablet, Take 2 mg by mouth 2 (two) times daily as needed for sleep or anxiety., Disp: , Rfl:  .  amitriptyline  (ELAVIL) 50 MG tablet, Take 2 tablets at bedtime, Disp: 60 tablet, Rfl: 6 .  aspirin EC 81 MG tablet, Take 1 tablet (81 mg total) by mouth daily., Disp: 30 tablet, Rfl: 11 .  dexlansoprazole (DEXILANT) 60 MG capsule, Take 60 mg by mouth daily., Disp: , Rfl:  .  insulin aspart (NOVOLOG) 100 UNIT/ML injection, Inject 10 Units into the skin 2 (two) times daily. 10 units twice a day, Disp: , Rfl:  .  Insulin Glargine (LANTUS SOLOSTAR) 100 UNIT/ML Solostar Pen, Inject 50 Units into the skin 2 (two) times daily., Disp: , Rfl:  .  nebivolol (BYSTOLIC) 10 MG tablet, Take 1 tablet (10 mg total) by mouth daily., Disp: 30 tablet, Rfl: 11 .  nitroGLYCERIN (NITROSTAT) 0.4 MG SL tablet, Place 1 tablet (0.4 mg total) under the tongue every 5 (five) minutes as needed for chest pain., Disp: 25 tablet, Rfl: 3 .  prasugrel (EFFIENT) 10 MG TABS tablet, Take 1 tablet (10 mg total) by mouth daily., Disp: 30 tablet, Rfl: 10 .  pravastatin (PRAVACHOL) 80 MG tablet, Take 1 tablet (80 mg total) by mouth daily., Disp: 30 tablet, Rfl: 6 .  pregabalin (LYRICA) 300 MG capsule, Take 1 capsule (300 mg total) by mouth 2 (two) times daily., Disp: 60 capsule, Rfl: 3 .  sitaGLIPtin-metformin (JANUMET) 50-1000 MG per tablet, Take 1 tablet by mouth 2 (two) times daily with a meal., Disp: , Rfl:  .  traMADol (ULTRAM) 50 MG tablet, Take 1 tablet (50 mg total) by mouth every 6 (six) hours as needed., Disp: 60 tablet, Rfl: 1 .  benzonatate (TESSALON) 200 MG capsule, Take 1 capsule (200 mg total) by mouth 3 (three) times daily as needed for cough., Disp: 50 capsule, Rfl: 1

## 2014-09-19 NOTE — Patient Instructions (Signed)
We will call you with the results of the chest x-ray Follow the acid reflux diet we gave you In addition to your heartburn medication, I when she did take Zantac at night  Use Nasacort two puffs in each nostril once per day.  Remember that the Nasacort can take 1-2 weeks to work after regular use. Use generic zyrtec (cetirizine) every day.  If this doesn't help, then stop taking it and use chlorpheniramine-phenylephrine combination tablets.  You need to try to suppress your cough to allow your larynx (voice box) to heal.  For three days don't talk, laugh, sing, or clear your throat. Do everything you can to suppress the cough during this time. Use hard candies (sugarless Jolly Ranchers) or non-mint or non-menthol containing cough drops during this time to soothe your throat.  Use a cough suppressant (Delsym, tramadol, or the tessalon I prescribed) around the clock during this time.  After three days, gradually increase the use of your voice and back off on the cough suppressants.  We will see you back in 4 weeks or sooner if needed

## 2014-09-19 NOTE — Assessment & Plan Note (Signed)
She has a near continuous cough which has been bothersome since November 2015 when she had her non-ST elevation MI. The cough is worse when lying flat which makes me think of postnasal drip and acid reflux. She has known heartburn and given her obesity acid reflux is the most likely cause. I cannot tell where she has been treated with an ACE inhibitor in the last 12 months, though she did have with senna pill on her medication list in 2014. Her chest x-ray in November 2015 did not show evidence of a pulmonary parenchymal disease per Myra reviewed. I reviewed the cardiology and primary care notes and it does not look as if anyone else has treated the cough recently.  In summary I think the cough is due to acid reflux, possibly postnasal drip and definitely ongoing laryngeal irritation from recurrent cough.  Plan: Chest x-ray Voice rest encouraged Acid reflux lifestyle modifications recommended Take a proton pump inhibitor in the morning and H2 blocker at night Take an over-the-counter decongestion/and a histamine such as Zyrtec and Nasacort Follow-up 4 weeks

## 2014-09-21 ENCOUNTER — Ambulatory Visit (INDEPENDENT_AMBULATORY_CARE_PROVIDER_SITE_OTHER): Payer: Self-pay | Admitting: Neurology

## 2014-09-21 ENCOUNTER — Encounter: Payer: Self-pay | Admitting: Neurology

## 2014-09-21 VITALS — BP 130/80 | HR 66 | Ht 62.0 in | Wt 197.0 lb

## 2014-09-21 DIAGNOSIS — F329 Major depressive disorder, single episode, unspecified: Secondary | ICD-10-CM

## 2014-09-21 DIAGNOSIS — R413 Other amnesia: Secondary | ICD-10-CM

## 2014-09-21 DIAGNOSIS — F32A Depression, unspecified: Secondary | ICD-10-CM | POA: Insufficient documentation

## 2014-09-21 NOTE — Patient Instructions (Addendum)
1. Take amitriptyline at dinner instead of bedtime 2. Discuss mood with your PCP, mood changes can affect memory as well 3. Ask about B12 level with PCP, if not done recently, would re-check 4. Follow-up in 6 months, we will re-visit doing Neuropsychological evaluation in the future 5. Physical exercise and brain stimulation exercises, as well as control of BP, cholesterol, and diabetes, are important for brain health

## 2014-09-21 NOTE — Progress Notes (Signed)
NEUROLOGY FOLLOW UP OFFICE NOTE  [name] [id number]  HISTORY OF PRESENT ILLNESS: I had the pleasure of seeing [name] in follow-up in the neurology clinic on 09/21/2014.  The patient was last seen 3 months ago for memory changes after an MI in 02/2014. She was noted to be anxious and tearful on her last visit, we had discussed pseudodementia and effects of mood on memory. She increased amitriptyline to 100mg  qhs to help with mood and sleep, but states that it causes daytime drowsiness and has not helped much with mood or sleep. She has more bad days than good with regards to mood. She only gets up to 5 hours of sleep. She continues to get lost driving occasionally, and needs notes for everything. Her mother stays at her home for several days, and calls her daily to remind her to take her medication. She denies any dizziness, diplopia, focal numbness/tingling/weakness. She has occasional headaches without nausea/vomiting/photo/phonophobia.  HPI: This is a 49 yo RH woman with a history of hypertension, hyperlipidemia, diabetes, CAD s/p MI, fibromyalgia, peripheral neuropathy, with significant memory changes since her heart attack in November 2015. She was in her usual state of health until 03/03/14 when she started having chest pain and dyspnea, admitted to [facility] where she underwent cardiac cath and was found occluded RCA, ulcerated plaque in the mid-LAD, and post-procedurally had hypotension and intermittent bradycardia. She was noted to have continued sense of breathlessness but not hypoxic or tachypneic. Since hospital admission, she has been having significant cognitive deficits. She is "just not remembering simple things that I should remember." She lives by herself and has gotten confused driving to her children's houses. She would get disoriented driving, one time she went to the mall and did not recall where she was. She has post-it notes on her car if she does not recall where she is  instructing her to enter Home on her GPS. She has burned food on the stove. Her mother comes a couple of days a week to help her. She has forgotten to pay bills. She has a post-it on the lamp to remind her to look at her calendar when she wakes up. She has multiple notes on her phone. She used to be on top of everything in the past, denying and prior memory deficits.   She denies any family history of memory loss. No prior history of head injury. She has "horrible sleep," with an average of 3 hours of sleep. She denies sleep difficulties prior to the heart attack, but then reported taking amitriptyline to help her rest beter from the fibromyalgia. She has been having headaches 1-2 times a week, with pounding over the bilateral temporal regions, associated photophobia, no nausea/vomiting. She gets dizzy when she stands up quickly or bends down/turns around. She "falls all the time" due to her neuropathy and fibromyalgia, with her legs giving out on her. She has been taking Lyrica 300mg  BID for the past 2-3 years for these. She has been having a lot of anxiety since the heart attack, with panic attacks 3-4 times a week. She takes Xanax 1/2 tablet BID.   PAST MEDICAL HISTORY: Past Medical History  Diagnosis Date  . Diabetes mellitus   . Dyslipidemia   . Clostridium difficile colitis   . Sea-blue histiocyte syndrome     a. s/p splenectomy.  . Leukocytosis     Dr Tressie Stalker  . Fibromyalgia   . Neuropathy, peripheral   . Upper respiratory infection 2/15  states has dry non productive cough since then- "tickle in my throat" no fever  . Anxiety   . GERD (gastroesophageal reflux disease)   . H/O hiatal hernia   . CAD (coronary artery disease)     a.  Inferior STEMI / CAD s/p DES to prox RCA 03/03/14, staged cath 03/05/14 for LAD showing significantly improved stenosis of mid LAD with resolution of prior thrombus.  . Sinus bradycardia     a. During 02/2014 admit, not on BB due to this.  . Ischemic  cardiomyopathy     a. EF 45-50% by echo 03/04/14 with suspected mild acute diastolic CHF s/p dose of IV Lasix.  Marland Kitchen NASH (nonalcoholic steatohepatitis)     MEDICATIONS: Current Outpatient Prescriptions on File Prior to Visit  Medication Sig Dispense Refill  . alprazolam (XANAX) 2 MG tablet Take 2 mg by mouth 2 (two) times daily as needed for sleep or anxiety.    Marland Kitchen amitriptyline (ELAVIL) 50 MG tablet Take 2 tablets at bedtime 60 tablet 6  . aspirin EC 81 MG tablet Take 1 tablet (81 mg total) by mouth daily. 30 tablet 11  . benzonatate (TESSALON) 200 MG capsule Take 1 capsule (200 mg total) by mouth 3 (three) times daily as needed for cough. 50 capsule 1  . dexlansoprazole (DEXILANT) 60 MG capsule Take 60 mg by mouth daily.    . insulin aspart (NOVOLOG) 100 UNIT/ML injection Inject 10 Units into the skin 2 (two) times daily. 10 units twice a day    . Insulin Glargine (LANTUS SOLOSTAR) 100 UNIT/ML Solostar Pen Inject 50 Units into the skin 2 (two) times daily.    . nebivolol (BYSTOLIC) 10 MG tablet Take 1 tablet (10 mg total) by mouth daily. 30 tablet 11  . nitroGLYCERIN (NITROSTAT) 0.4 MG SL tablet Place 1 tablet (0.4 mg total) under the tongue every 5 (five) minutes as needed for chest pain. 25 tablet 3  . prasugrel (EFFIENT) 10 MG TABS tablet Take 1 tablet (10 mg total) by mouth daily. 30 tablet 10  . pravastatin (PRAVACHOL) 80 MG tablet Take 1 tablet (80 mg total) by mouth daily. 30 tablet 6  . pregabalin (LYRICA) 300 MG capsule Take 1 capsule (300 mg total) by mouth 2 (two) times daily. 60 capsule 3  . sitaGLIPtin-metformin (JANUMET) 50-1000 MG per tablet Take 1 tablet by mouth 2 (two) times daily with a meal.    . traMADol (ULTRAM) 50 MG tablet Take 1 tablet (50 mg total) by mouth every 6 (six) hours as needed. 60 tablet 1   No current facility-administered medications on file prior to visit.    ALLERGIES: Allergies  Allergen Reactions  . Aspartame And Phenylalanine Shortness Of Breath  and Swelling    Throat swells  ANY ARTIFICAL SWEETNERS  . Toradol [Ketorolac Tromethamine] Other (See Comments)    Face & neck flushed red & pt felt very hot after IV adm.  . Sucralose Rash    Pt.s face broke out in rash after drinking Breeza.  Pt. Claims it is from the artificial sweetener.     . Dilaudid [Hydromorphone Hcl]     Itch  . Hydrocodone Itching  . Oxycodone Rash    itching    FAMILY HISTORY: Family History  Problem Relation Age of Onset  . Colon cancer Neg Hx   . Arthritis Mother   . Diabetes Father   . Kidney disease Father     SOCIAL HISTORY: History   Social History  . Marital  Status: Legally Separated    Spouse Name: N/A  . Number of Children: 2  . Years of Education: N/A   Occupational History  . homemaker    Social History Main Topics  . Smoking status: Former Smoker -- 0.25 packs/day for 30 years    Types: Cigarettes    Quit date: 04/20/2006  . Smokeless tobacco: Never Used  . Alcohol Use: 0.0 oz/week    0 Standard drinks or equivalent per week     Comment: rarely  . Drug Use: No  . Sexual Activity: Yes    Birth Control/ Protection: None, Surgical   Other Topics Concern  . Not on file   Social History Narrative   Lives w/ husband & step daughter   Also has grandchildren from her son & daughter    REVIEW OF SYSTEMS: Constitutional: No fevers, chills, or sweats, no generalized fatigue, change in appetite Eyes: No visual changes, double vision, eye pain Ear, nose and throat: No hearing loss, ear pain, nasal congestion, sore throat Cardiovascular: No chest pain, palpitations Respiratory:  No shortness of breath at rest or with exertion, wheezes GastrointestinaI: No nausea, vomiting, diarrhea, abdominal pain, fecal incontinence Genitourinary:  No dysuria, urinary retention or frequency Musculoskeletal:  No neck pain, back pain Integumentary: No rash, pruritus, skin lesions Neurological: as above Psychiatric: + depression, insomnia,  anxiety Endocrine: No palpitations, fatigue, diaphoresis, mood swings, change in appetite, change in weight, increased thirst Hematologic/Lymphatic:  No anemia, purpura, petechiae. Allergic/Immunologic: no itchy/runny eyes, nasal congestion, recent allergic reactions, rashes  PHYSICAL EXAM: Filed Vitals:   09/21/14 1422  BP: 130/80  Pulse: 66   General: No acute distress Head:  Normocephalic/atraumatic Neck: supple, no paraspinal tenderness, full range of motion Heart:  Regular rate and rhythm Lungs:  Clear to auscultation bilaterally Back: No paraspinal tenderness Skin/Extremities: No rash, no edema Neurological Exam: alert and oriented to person, place, and time. No aphasia or dysarthria. Fund of knowledge is appropriate.  Recent and remote memory are intact. 2/3 delayed recall.  Attention and concentration are normal.    Able to name objects and repeat phrases. Cranial nerves: Pupils equal, round, reactive to light.  Fundoscopic exam unremarkable, no papilledema. Extraocular movements intact with no nystagmus. Visual fields full. Facial sensation intact. No facial asymmetry. Tongue, uvula, palate midline.  Motor: Bulk and tone normal, muscle strength 5/5 throughout with no pronator drift.  Sensation to light touch intact.  No extinction to double simultaneous stimulation.  Deep tendon reflexes 2+ throughout, toes downgoing.  Finger to nose testing intact.  Gait narrow-based and steady, able to tandem walk adequately.  Romberg negative.  IMPRESSION: This is a 49 yo RH woman with vascular risk factors including hypertension, hyperlipidemia, diabetes, CAD s/p STEMI in November 2015, with memory changes since her MI. She has had short-term memory difficulties which affect her daily living (driving, cooking, bill payments). MMSE in March 2016 was normal 28/30. Head CT done for memory loss did not show any acute changes, no evidence of ischemic injury. She was noted to have hypotension and  bradycardia on hospital admission and potentially had hypoxic encephalopathy. She currently has insurance issues and cannot do an MRI. We had discussed other causes of memory loss that could potentially be contributing, if not done, B12 level should be checked. She has depression and again we discussed pseudodementia and effects of mood on memory. She will discuss treatment of depression with her PCP. She does not want to increase amitriptyline and will take it at  an earlier time to hopefully help with daytime drowsiness. We discussed the importance of controlling vascular risk factors, physical exercise, and brain stimulation exercises for brain health. She may benefit from Neuropsychological evaluation/cognitive behavioral therapy in the future once insurance issues are addressed. She will follow-up in 6 months and knows to call our office for any changes.   Thank you for allowing me to participate in her care.  Please do not hesitate to call for any questions or concerns.  The duration of this appointment visit was 15 minutes of face-to-face time with the patient.  Greater than 50% of this time was spent in counseling, explanation of diagnosis, planning of further management, and coordination of care.   Ellouise Newer, M.D.   [facility]

## 2014-10-01 ENCOUNTER — Telehealth: Payer: Self-pay | Admitting: Neurology

## 2014-10-01 DIAGNOSIS — R413 Other amnesia: Secondary | ICD-10-CM

## 2014-10-01 NOTE — Telephone Encounter (Signed)
Left message on machine for patient to call back.

## 2014-10-01 NOTE — Telephone Encounter (Signed)
-----   Message from Cameron Sprang, MD sent at 10/01/2014 10:52 AM EDT ----- Regarding: B12 level Pls let her know I reviewed PCP labs, B12 was not done, would recommend checking B12 level. Thanks!

## 2014-10-03 NOTE — Telephone Encounter (Signed)
Left another message on machine to let patient know that we needed blood work drawn and to call our office for details. Awaiting call back.

## 2014-10-10 NOTE — Addendum Note (Signed)
Addended by: Thurmon Fair on: 10/10/2014 12:22 PM   Modules accepted: Orders

## 2014-10-10 NOTE — Telephone Encounter (Signed)
Called patient again since she hadn't returned any of the previous calls from messages that were left. I did speak her and notified of Dr. Amparo Bristol advisment. She is agreeable to having Vitamin B12 level drawn. I will mail her an order to take to pcp office to have blood drawn there as she lives in [place].

## 2014-10-15 ENCOUNTER — Other Ambulatory Visit: Payer: Self-pay

## 2014-10-23 ENCOUNTER — Ambulatory Visit: Payer: Self-pay | Admitting: Pulmonary Disease

## 2014-11-22 ENCOUNTER — Encounter: Payer: Self-pay | Admitting: Pulmonary Disease

## 2014-11-22 ENCOUNTER — Ambulatory Visit (INDEPENDENT_AMBULATORY_CARE_PROVIDER_SITE_OTHER): Payer: Self-pay | Admitting: Pulmonary Disease

## 2014-11-22 VITALS — BP 126/72 | HR 76 | Ht 62.0 in | Wt 192.0 lb

## 2014-11-22 DIAGNOSIS — R059 Cough, unspecified: Secondary | ICD-10-CM

## 2014-11-22 DIAGNOSIS — R05 Cough: Secondary | ICD-10-CM

## 2014-11-22 NOTE — Patient Instructions (Signed)
We will call you with the results of the lung function test Follow the acid reflux diet we gave you In addition to your heartburn medication, I want you to take Pepcid at night  Use Nasacort two puffs in each nostril once per day. Remember that the Nasacort can take 1-2 weeks to work after regular use. Use generic chlorpheniramine-phenylephrine combination tablets 2 to 3 times per day.  You need to try to suppress your cough to allow your larynx (voice box) to heal. For three days don't talk, laugh, sing, or clear your throat. Do everything you can to suppress the cough during this time. Use hard candies (sugarless Jolly Ranchers) or non-mint or non-menthol containing cough drops during this time to soothe your throat. Use a cough suppressant (Delsym, tramadol, or the tessalon I prescribed) around the clock during this time. After three days, gradually increase the use of your voice and back off on the cough suppressants.  We will see you back in 4 weeks or sooner if needed

## 2014-11-22 NOTE — Assessment & Plan Note (Addendum)
Her cough is no better. I believe that is related to acid reflux and postnasal drip as well as ongoing laryngeal irritation from continual coughing. Unfortunately she did not follow any of the recommendations that we gave her after the last visit and she can't recall any of the instructions we gave her. In a lengthy visit today I went over the rationale for treating acid reflux and postnasal drip.  There is no convincing evidence of lung disease, but we will check a pulmonary function test to make sure there is no evidence of asthma or COPD. She may ultimately need a CT scan of the chest if she does not have improvement in her cough.  I believe that laryngeal irritation is the biggest cause of the cough at this time. Unfortunately, this will prove to be very difficult to treat as she is currently on Lyrica, tramadol, and Elavil, all of which are the medications which are most commonly used for irritable larynx syndrome.  Plan: Pulmonary function test We are going to treat acid reflux as well as postnasal drip as these are the most common causes of cough in this scenario. If there is no improvement after 4 weeks of treatment then we will order a CT scan of the chest and a CT scan of the sinuses. If this showed no evidence of lung or sinus disease then we will have an otolaryngologist perform video laryngoscopy of her larynx.  I have given her strict instructions for acid reflux treatment and postnasal drip as detailed in the instruction section. I've also strongly encouraged voice rest.  > 25 minute spent in direct consultation

## 2014-11-22 NOTE — Addendum Note (Signed)
Addended by: Oscar La R on: 11/22/2014 03:15 PM   Modules accepted: Orders

## 2014-11-22 NOTE — Progress Notes (Signed)
Subjective:    Patient ID: [name], female    DOB: 16-Jan-1966, 49 y.o.   MRN: [id number]  Synopsis: Referred for cough in 2016, has history of acid reflux and postnasal drip. Quit smoking around 2006.   HPI Chief Complaint  Patient presents with  . Follow-up    pt states her cough has improved but is still very present.  cough is also now prod with yellow-green mucus X1.5 mos.     [name] is still coughing quite a bit.  The cough is much worse when lying down or on her side.  The cough is constant.  She says that sometimes she still gets short of breath from time to time.  She can't predict when she will get short of breath, sometimes just minimal exertion can cause it.  It is not consistent though, usually she is coughing when she gets short of breath.  Talking a lot makes her dyspneic, and then she will cough a lot. She can't remember the therapy we recommended after the last visit. She didn't take all the medications we gave her. She denies heartburn or indigestion.  She is taking two medications.  She is not taking any sinus problems rght now.  No post nasal drip.     Past Medical History  Diagnosis Date  . Diabetes mellitus   . Dyslipidemia   . Clostridium difficile colitis   . Sea-blue histiocyte syndrome     a. s/p splenectomy.  . Leukocytosis     Dr Tressie Stalker  . Fibromyalgia   . Neuropathy, peripheral   . Upper respiratory infection 2/15    states has dry non productive cough since then- "tickle in my throat" no fever  . Anxiety   . GERD (gastroesophageal reflux disease)   . H/O hiatal hernia   . CAD (coronary artery disease)     a.  Inferior STEMI / CAD s/p DES to prox RCA 03/03/14, staged cath 03/05/14 for LAD showing significantly improved stenosis of mid LAD with resolution of prior thrombus.  . Sinus bradycardia     a. During 02/2014 admit, not on BB due to this.  . Ischemic cardiomyopathy     a. EF 45-50% by echo 03/04/14 with suspected mild acute  diastolic CHF s/p dose of IV Lasix.  Marland Kitchen NASH (nonalcoholic steatohepatitis)       Review of Systems  Constitutional: Negative for fever, chills and fatigue.  HENT: Negative for postnasal drip, rhinorrhea and sinus pressure.   Respiratory: Positive for cough and shortness of breath. Negative for wheezing and stridor.   Cardiovascular: Negative for chest pain, palpitations and leg swelling.       Objective:   Physical Exam Filed Vitals:   11/22/14 1432  BP: 126/72  Pulse: 76  Height: 5\' 2"  (1.575 m)  Weight: 192 lb (87.091 kg)  SpO2: 98%   RA  Gen: frequent cough but  well appearing HENT: OP clear, TM's clear, neck supple PULM: CTA B, normal percussion CV: RRR, no mgr, trace edema GI: BS+, soft, nontender Derm: no cyanosis or rash Psyche: normal mood and affect  CXR from 6/1 reviewed> no infiltrate or evidence of lung disease      Assessment & Plan:  Cough Her cough is no better. I believe that is related to acid reflux and postnasal drip as well as ongoing laryngeal irritation from continual coughing. Unfortunately she did not follow any of the recommendations that we gave her after the last visit and she can't  recall any of the instructions we gave her. In a lengthy visit today I went over the rationale for treating acid reflux and postnasal drip.  There is no convincing evidence of lung disease, but we will check a pulmonary function test to make sure there is no evidence of asthma or COPD. She may ultimately need a CT scan of the chest if she does not have improvement in her cough.  I believe that laryngeal irritation is the biggest cause of the cough at this time. Unfortunately, this will prove to be very difficult to treat as she is currently on Lyrica, tramadol, and Elavil, all of which are the medications which are most commonly used for irritable larynx syndrome.  Plan: Pulmonary function test We are going to treat acid reflux as well as postnasal drip as these  are the most common causes of cough in this scenario. If there is no improvement after 4 weeks of treatment then we will order a CT scan of the chest and a CT scan of the sinuses. If this showed no evidence of lung or sinus disease then we will have an otolaryngologist perform video laryngoscopy of her larynx.  I have given her strict instructions for acid reflux treatment and postnasal drip as detailed in the instruction section. I've also strongly encouraged voice rest.  > 25 minute spent in direct consultation     Current outpatient prescriptions:  .  alprazolam (XANAX) 2 MG tablet, Take 2 mg by mouth 2 (two) times daily as needed for sleep or anxiety., Disp: , Rfl:  .  amitriptyline (ELAVIL) 50 MG tablet, Take 2 tablets at bedtime (Patient taking differently: Take 10 mg by mouth at bedtime. ), Disp: 60 tablet, Rfl: 6 .  aspirin EC 81 MG tablet, Take 1 tablet (81 mg total) by mouth daily., Disp: 30 tablet, Rfl: 11 .  benzonatate (TESSALON) 200 MG capsule, Take 1 capsule (200 mg total) by mouth 3 (three) times daily as needed for cough., Disp: 50 capsule, Rfl: 1 .  dexlansoprazole (DEXILANT) 60 MG capsule, Take 60 mg by mouth daily., Disp: , Rfl:  .  insulin aspart (NOVOLOG) 100 UNIT/ML injection, Inject 30 Units into the skin 2 (two) times daily. 10 units twice a day, Disp: , Rfl:  .  Insulin Glargine (LANTUS SOLOSTAR) 100 UNIT/ML Solostar Pen, Inject 50 Units into the skin 2 (two) times daily., Disp: , Rfl:  .  nebivolol (BYSTOLIC) 10 MG tablet, Take 1 tablet (10 mg total) by mouth daily., Disp: 30 tablet, Rfl: 11 .  nitroGLYCERIN (NITROSTAT) 0.4 MG SL tablet, Place 1 tablet (0.4 mg total) under the tongue every 5 (five) minutes as needed for chest pain., Disp: 25 tablet, Rfl: 3 .  prasugrel (EFFIENT) 10 MG TABS tablet, Take 1 tablet (10 mg total) by mouth daily., Disp: 30 tablet, Rfl: 10 .  pravastatin (PRAVACHOL) 80 MG tablet, Take 1 tablet (80 mg total) by mouth daily., Disp: 30 tablet,  Rfl: 6 .  pregabalin (LYRICA) 300 MG capsule, Take 1 capsule (300 mg total) by mouth 2 (two) times daily., Disp: 60 capsule, Rfl: 3 .  sitaGLIPtin-metformin (JANUMET) 50-1000 MG per tablet, Take 1 tablet by mouth 2 (two) times daily with a meal., Disp: , Rfl:  .  traMADol (ULTRAM) 50 MG tablet, Take 1 tablet (50 mg total) by mouth every 6 (six) hours as needed., Disp: 60 tablet, Rfl: 1

## 2014-11-29 ENCOUNTER — Telehealth: Payer: Self-pay | Admitting: *Deleted

## 2014-11-29 NOTE — Telephone Encounter (Signed)
I wanted to make you aware that your pt came for her PFT. We attempted the pre-spirometry but she couldn't get through it at all due to coughing so much. She said if there was something different you needed her to do then we could call her. Her PFT didn't get completed due to this. FYI

## 2014-12-20 LAB — PULMONARY FUNCTION TEST
FEF 25-75 Pre: 2.85 L/sec
FEF2575-%Pred-Pre: 107 %
FEV1-%Pred-Pre: 93 %
FEV1-Pre: 2.45 L
FEV1FVC-%Pred-Pre: 103 %
FEV6-%Pred-Pre: 92 %
FEV6-Pre: 2.96 L
FEV6FVC-%Pred-Pre: 103 %
FVC-%Pred-Pre: 89 %
FVC-Pre: 2.96 L
Pre FEV1/FVC ratio: 83 %
Pre FEV6/FVC Ratio: 100 %

## 2014-12-25 ENCOUNTER — Ambulatory Visit: Payer: Self-pay | Admitting: Pulmonary Disease

## 2015-03-25 ENCOUNTER — Ambulatory Visit: Payer: Self-pay | Admitting: Neurology

## 2015-03-29 ENCOUNTER — Ambulatory Visit: Payer: Self-pay | Admitting: Neurology

## 2015-05-01 ENCOUNTER — Other Ambulatory Visit: Payer: Self-pay | Admitting: Obstetrics and Gynecology

## 2015-05-01 DIAGNOSIS — R921 Mammographic calcification found on diagnostic imaging of breast: Secondary | ICD-10-CM

## 2015-05-15 ENCOUNTER — Ambulatory Visit
Admission: RE | Admit: 2015-05-15 | Discharge: 2015-05-15 | Disposition: A | Discharge status: Home / Self Care | Payer: No Typology Code available for payment source | Source: Ambulatory Visit | Attending: Obstetrics and Gynecology | Admitting: Obstetrics and Gynecology

## 2015-05-15 DIAGNOSIS — R921 Mammographic calcification found on diagnostic imaging of breast: Secondary | ICD-10-CM

## 2016-07-24 ENCOUNTER — Encounter (HOSPITAL_COMMUNITY): Payer: Self-pay | Admitting: *Deleted

## 2016-11-05 ENCOUNTER — Encounter (HOSPITAL_COMMUNITY): Payer: Self-pay | Admitting: *Deleted

## 2016-11-05 ENCOUNTER — Emergency Department (HOSPITAL_COMMUNITY): Payer: Self-pay

## 2016-11-05 ENCOUNTER — Emergency Department (HOSPITAL_COMMUNITY)
Admission: EM | Admit: 2016-11-05 | Discharge: 2016-11-05 | Disposition: A | Discharge status: Home / Self Care | Payer: Self-pay | Attending: Emergency Medicine | Admitting: Emergency Medicine

## 2016-11-05 DIAGNOSIS — Z87891 Personal history of nicotine dependence: Secondary | ICD-10-CM | POA: Insufficient documentation

## 2016-11-05 DIAGNOSIS — I2119 ST elevation (STEMI) myocardial infarction involving other coronary artery of inferior wall: Secondary | ICD-10-CM | POA: Insufficient documentation

## 2016-11-05 DIAGNOSIS — E119 Type 2 diabetes mellitus without complications: Secondary | ICD-10-CM | POA: Insufficient documentation

## 2016-11-05 DIAGNOSIS — I2510 Atherosclerotic heart disease of native coronary artery without angina pectoris: Secondary | ICD-10-CM | POA: Insufficient documentation

## 2016-11-05 DIAGNOSIS — M25561 Pain in right knee: Secondary | ICD-10-CM | POA: Insufficient documentation

## 2016-11-05 LAB — CBG MONITORING, ED: Glucose-Capillary: 303 mg/dL — ABNORMAL HIGH (ref 65–99)

## 2016-11-05 MED ORDER — TRAMADOL HCL 50 MG PO TABS: 50.0000 mg | ORAL_TABLET | Freq: Four times a day (QID) | ORAL | 0 refills | Status: DC | PRN

## 2016-11-05 NOTE — ED Triage Notes (Signed)
Pain in right knee, no known injury

## 2016-11-05 NOTE — ED Provider Notes (Signed)
Emergency Department Provider Note   I have reviewed the triage vital signs and the nursing notes.   HISTORY  Chief Complaint Knee Pain   HPI [name] is a 51 y.o. female with PMH of DM, CAD, and peripheral neuropathy who presents to the emergency room in for evaluation of right knee pain over the last 3 months that is worsened significantly over the past 48 hours. She denies any injury to the knee. The pain is worse with standing or movement. She finds it especially painful to walk up stairs. She began having more severe pain last night is finding it difficult to sleep because of discomfort. She's not noticed any redness or other rash in the area of the knee. She is mainly having lower and lateral knee pain. No calf swelling or discomfort. She saw her primary care physician 3 months ago who prescribed anti-inflammatories but these did not help significantly. Patient states that she does not have insurance and has been unable to follow-up with a specialist. She denies any fevers or chills.  Past Medical History:  Diagnosis Date  . Anxiety   . CAD (coronary artery disease)    a.  Inferior STEMI / CAD s/p DES to prox RCA 03/03/14, staged cath 03/05/14 for LAD showing significantly improved stenosis of mid LAD with resolution of prior thrombus.  . Clostridium difficile colitis   . Diabetes mellitus   . Dyslipidemia   . Fibromyalgia   . GERD (gastroesophageal reflux disease)   . H/O hiatal hernia   . Ischemic cardiomyopathy    a. EF 45-50% by echo 03/04/14 with suspected mild acute diastolic CHF s/p dose of IV Lasix.  . Leukocytosis    Dr Tressie Stalker  . NASH (nonalcoholic steatohepatitis)   . Neuropathy, peripheral   . Sea-blue histiocyte syndrome    a. s/p splenectomy.  . Sinus bradycardia    a. During 02/2014 admit, not on BB due to this.  Marland Kitchen Upper respiratory infection 2/15   states has dry non productive cough since then- "tickle in my throat" no fever    Patient  Active Problem List   Diagnosis Date Noted  . Depression 09/21/2014  . Cough 09/19/2014  . Dyspnea 05/10/2014  . Angina decubitus ([place]) 03/28/2014  . Memory loss 03/28/2014  . Bleeding gums 03/28/2014  . Hypotension 03/06/2014  . Sinus bradycardia   . Ischemic cardiomyopathy   . CAD (coronary artery disease)   . Hypokalemia 03/05/2014  . Essential hypertension   . ST elevation myocardial infarction (STEMI) involving right coronary artery with complication ([place]) 77/82/4235  . Left rotator cuff tear 07/27/2013  . Complete rotator cuff tear of left shoulder 07/27/2013  . Otalgia 05/25/2013  . Rotator cuff tear, non-traumatic 03/02/2013  . Weakness generalized 09/06/2012  . Fall 09/06/2012  . Adjustment disorder with depressed mood 09/06/2012  . Dizziness and giddiness 07/25/2012  . Fibromyalgia 05/12/2012  . Asplenia 05/12/2012  . Diarrhea 01/07/2012  . Nonalcoholic fatty liver disease 01/07/2012  . GERD (gastroesophageal reflux disease) 09/15/2011  . Leukocytosis 08/22/2011  . Diabetes type 2, uncontrolled ([place]) 08/06/2011  . Hyperlipidemia   . Lateral epicondylitis  of elbow 12/17/2008    Past Surgical History:  Procedure Laterality Date  . ABDOMINAL HYSTERECTOMY     partial  . CARDIAC CATHETERIZATION  03/05/14   improved LAD  . COLONOSCOPY  12/07/2011   Rourk-normal rectum,, colon and terminal ileum  . CORONARY ANGIOPLASTY WITH STENT PLACEMENT  03/03/14   DES to RCA,  ulcerated plaque in LAD  . ESOPHAGOGASTRODUODENOSCOPY  08/27/2011   Rourk-reactive gastropathy, normal, patent esophagus. Stomach empty. Multiple linear antral erosions. No ulcer or infiltrating process. Small hiatal hernia. Patent pylorus. Normal first and second portion of the duodenum  . LEFT HEART CATH Bilateral 03/03/2014   Procedure: LEFT HEART CATH;  Surgeon: Peter M Martinique, MD;  Location: [facility] CATH LAB;  Service: Cardiovascular;  Laterality: Bilateral;  . PERCUTANEOUS CORONARY STENT INTERVENTION  (PCI-S) N/A 03/05/2014   Procedure: PERCUTANEOUS CORONARY STENT INTERVENTION (PCI-S);  Surgeon: Troy Sine, MD;  Location: [facility] CATH LAB;  Service: Cardiovascular;  Laterality: N/A;  . SHOULDER ARTHROSCOPY WITH OPEN ROTATOR CUFF REPAIR Left 03/02/2013   Procedure: LEFT SHOULDER ARTHROSCOPY, SUBACROMIAL DECOMPRESSION, MANIPULATION UNDER ANESTHESIA;  Surgeon: [name], MD;  Location: [facility] ORS;  Service: Orthopedics;  Laterality: Left;  . SHOULDER OPEN ROTATOR CUFF REPAIR Left 07/27/2013   Procedure: LEFT MINI OPEN ROTATOR CUFF REPAIR ;  Surgeon: [name], MD;  Location: [facility] ORS;  Service: Orthopedics;  Laterality: Left;  . SPLENECTOMY    . TONSILLECTOMY      Current Outpatient Rx  . Order #: [id number] Class: Historical Med  . Order #: [id number] Class: Normal  . Order #: [id number] Class: Normal  . Order #: [id number] Class: Normal  . Order #: [id number] Class: Historical Med  . Order #: [id number] Class: Historical Med  . Order #: [id number] Class: Historical Med  . Order #: [id number] Class: Normal  . Order #: [id number] Class: Normal  . Order #: [id number] Class: Normal  . Order #: [id number] Class: Normal  . Order #: [id number] Class: Print  . Order #: [id number] Class: Historical Med  . Order #: [id number] Class: Print    Allergies Aspartame and phenylalanine; Toradol [ketorolac tromethamine]; Sucralose; Dilaudid [hydromorphone hcl]; Hydrocodone; and Oxycodone  Family History  Problem Relation Age of Onset  . Arthritis Mother   . Diabetes Father   . Kidney disease Father   . Colon cancer Neg Hx     Social History Social History  Substance Use Topics  . Smoking status: Former Smoker    Packs/day: 0.25    Years: 30.00    Types: Cigarettes    Quit date: 04/20/2006  . Smokeless tobacco: Never Used  . Alcohol use 0.0 oz/week     Comment: rarely    Review of Systems  Constitutional: No fever/chills Genitourinary: Negative for dysuria. Musculoskeletal: Negative for back pain.  Positive right knee pain.   10-point ROS otherwise negative.  ____________________________________________   PHYSICAL EXAM:  VITAL SIGNS: ED Triage Vitals  Enc Vitals Group     BP 11/05/16 1106 (!) 155/78     Pulse Rate 11/05/16 1106 85     Resp 11/05/16 1106 20     Temp 11/05/16 1106 98.2 F (36.8 C)     Weight 11/05/16 1107 168 lb (76.2 kg)     Height 11/05/16 1107 5' 2.5" (1.588 m)     Pain Score 11/05/16 1105 8   Constitutional: Alert and oriented. Well appearing and in no acute distress. Eyes: Conjunctivae are normal.  Head: Atraumatic. Nose: No congestion/rhinnorhea. Mouth/Throat: Mucous membranes are moist.  Oropharynx non-erythematous. Neck: No stridor. Musculoskeletal: No lower extremity edema. Mild right knee effusion. No overlying erythema or warmth. Some pain with passive and active ROM of the right knee but no limitation in range. Patient ambulatory with a slight limp. Normal appearance of contralateral knee. No LE edema or calf tenderness or redness. No knee joint laxity. Some lateral point tenderness over the meniscus.  Neurologic:  Normal  speech and language. No gross focal neurologic deficits are appreciated.  Skin:  Skin is warm, dry and intact. No rash noted. Psychiatric: Mood and affect are normal. Speech and behavior are normal.  ____________________________________________   LABS (all labs ordered are listed, but only abnormal results are displayed)  Labs Reviewed  CBG MONITORING, ED - Abnormal; Notable for the following:       Result Value   Glucose-Capillary 303 (*)    All other components within normal limits   ____________________________________________  RADIOLOGY  Dg Knee Complete 4 Views Right  Result Date: 11/05/2016 CLINICAL DATA:  Right knee pain, no known injury EXAM: RIGHT KNEE - COMPLETE 4+ VIEW COMPARISON:  None. FINDINGS: No evidence of fracture, dislocation, or joint effusion. No evidence of arthropathy or other focal bone  abnormality. Soft tissues are unremarkable. IMPRESSION: No acute osseous injury of the right knee. Electronically Signed   By: Kathreen Devoid   On: 11/05/2016 11:55    ____________________________________________   PROCEDURES  Procedure(s) performed:   Procedures  None ____________________________________________   INITIAL IMPRESSION / ASSESSMENT AND PLAN / ED COURSE  Pertinent labs & imaging results that were available during my care of the patient were reviewed by me and considered in my medical decision making (see chart for details).  Patient resents to the emergency department for evaluation of right knee pain. There is no evidence to suggest septic arthritis or gout. She has point tenderness over the lateral meniscus and her symptoms are most consistent with meniscal injury. No severe joint laxity to suggest ligamentous injury. Plain films of the knee from triage are normal. I provided Tramadol for breakthrough pain and encouraged Orthopedic follow up. Advised Motrin but patient tells me that she is not supposed to take any NSAIDs per her cardiologist.   At this time, I do not feel there is any life-threatening condition present. I have reviewed and discussed all results (EKG, imaging, lab, urine as appropriate), exam findings with patient. I have reviewed nursing notes and appropriate previous records.  I feel the patient is safe to be discharged home without further emergent workup. Discussed usual and customary return precautions. Patient and family (if present) verbalize understanding and are comfortable with this plan.  Patient will follow-up with their primary care provider. If they do not have a primary care provider, information for follow-up has been provided to them. All questions have been answered.    ____________________________________________  FINAL CLINICAL IMPRESSION(S) / ED DIAGNOSES  Final diagnoses:  Acute pain of right knee     MEDICATIONS GIVEN DURING  THIS VISIT:  Medications - No data to display   NEW OUTPATIENT MEDICATIONS STARTED DURING THIS VISIT:  Tramadol   Note:  This document was prepared using Dragon voice recognition software and may include unintentional dictation errors.  Nanda Quinton, MD Emergency Medicine   Long, [facility], MD 11/05/16 1302

## 2016-11-05 NOTE — Discharge Instructions (Signed)
Your workup was reassuring today that you do not have a bony injury or dislocation to your knee.  We recommend that you use the provided Ace wrap and crutches as needed, but you can continue to bear weight as tolerated.  Read through the included information about routine care for injuries.  Follow up as recommended if you are still having problems in about a week. ° °Knee Pain °Knee pain is a very common symptom and can have many causes. Knee pain often goes away when you follow your health care provider's instructions for relieving pain and discomfort at home. However, knee pain can develop into a condition that needs treatment. Some conditions may include: °Arthritis caused by wear and tear (osteoarthritis). °Arthritis caused by swelling and irritation (rheumatoid arthritis or gout). °A cyst or growth in your knee. °An infection in your knee joint. °An injury that will not heal. °Damage, swelling, or irritation of the tissues that support your knee (torn ligaments or tendinitis). °If your knee pain continues, additional tests may be ordered to diagnose your condition. Tests may include X-rays or other imaging studies of your knee. You may also need to have fluid removed from your knee. Treatment for ongoing knee pain depends on the cause, but treatment may include: °Medicines to relieve pain or swelling. °Steroid injections in your knee. °Physical therapy. °Surgery. °HOME CARE INSTRUCTIONS °Take medicines only as directed by your health care provider. °Rest your knee and keep it raised (elevated) while you are resting. °Do not do things that cause or worsen pain. °Avoid high-impact activities or exercises, such as running, jumping rope, or doing jumping jacks. °Apply ice to the knee area: °Put ice in a plastic bag. °Place a towel between your skin and the bag. °Leave the ice on for 20 minutes, 2-3 times a day. °Ask your health care provider if you should wear an elastic knee support. °Keep a pillow under your  knee when you sleep. °Lose weight if you are overweight. Extra weight can put pressure on your knee. °Do not use any tobacco products, including cigarettes, chewing tobacco, or electronic cigarettes. If you need help quitting, ask your health care provider. Smoking may slow the healing of any bone and joint problems that you may have. °SEEK MEDICAL CARE IF: °Your knee pain continues, changes, or gets worse. °You have a fever along with knee pain. °Your knee buckles or locks up. °Your knee becomes more swollen. °SEEK IMMEDIATE MEDICAL CARE IF:  °Your knee joint feels hot to the touch. °You have chest pain or trouble breathing. °  °This information is not intended to replace advice given to you by your health care provider. Make sure you discuss any questions you have with your health care provider. °  °Document Released: 02/01/2007 Document Revised: 04/27/2014 Document Reviewed: 11/20/2013 °Elsevier Interactive Patient Education ©2016 Else[employer] Inc[employer] ° ° °Elastic Bandage and RICE  ° °WHAT DOES AN ELASTIC BANDAGE DO?  ° °Elastic bandages come in different shapes and sizes. They generally provide support to your injury and reduce swelling while you are healing, but they can perform different functions. Your health care provider will help you to decide what is best for your protection, recovery, or rehabilitation following an injury.  °WHAT ARE SOME GENERAL TIPS FOR USING AN ELASTIC BANDAGE?  °Use the bandage as directed by the maker of the bandage that you are using.  °Do not wrap the bandage too tightly. This may cut off the circulation in the arm or leg in the   area below the bandage.  °If part of your body beyond the bandage becomes blue, numb, cold, swollen, or is more painful, your bandage is most likely too tight. If this occurs, remove your bandage and reapply it more loosely. °See your health care provider if the bandage seems to be making your problems worse rather than better.  °An elastic bandage should be  removed and reapplied every 3-4 hours or as directed by your health care provider. °WHAT IS RICE?  °The routine care of many injuries includes rest, ice, compression, and elevation (RICE therapy).  °Rest  °Rest is required to allow your body to heal. Generally, you can resume your routine activities when you are comfortable and have been given permission by your health care provider.  °Ice  °Icing your injury helps to keep the swelling down and it reduces pain. Do not apply ice directly to your skin.  °Put ice in a plastic bag.  °Place a towel between your skin and the bag.  °Leave the ice on for 20 minutes, 2-3 times per day. °Do this for as Saketh Daubert as you are directed by your health care provider.  °Compression  °Compression helps to keep swelling down, gives support, and helps with discomfort. Compression may be done with an elastic bandage.  °Elevation  °Elevation helps to reduce swelling and it decreases pain. If possible, your injured area should be placed at or above the level of your heart or the center of your chest.  °WHEN SHOULD I SEEK MEDICAL CARE?  °You should seek medical care if:  °You have persistent pain and swelling.  °Your symptoms are getting worse rather than improving. °These symptoms may indicate that further evaluation or further X-rays are needed. Sometimes, X-rays may not show a small broken bone (fracture) until a number of days later. Make a follow-up appointment with your health care provider. Ask when your X-ray results will be ready. Make sure that you get your X-ray results.  °WHEN SHOULD I SEEK IMMEDIATE MEDICAL CARE?  °You should seek immediate medical care if:  °You have a sudden onset of severe pain at or below the area of your injury.  °You develop redness or increased swelling around your injury.  °You have tingling or numbness at or below the area of your injury that does not improve after you remove the elastic bandage. °This information is not intended to replace advice given to  you by your health care provider. Make sure you discuss any questions you have with your health care provider.  °Document Released: 09/26/2001 Document Revised: 12/26/2014 Document Reviewed: 11/20/2013  °Elsevier Interactive Patient Education ©2016 Elsevier Inc.  ° °

## 2016-11-10 ENCOUNTER — Ambulatory Visit: Payer: No Typology Code available for payment source | Admitting: Orthopaedic Surgery

## 2021-10-04 ENCOUNTER — Emergency Department (HOSPITAL_COMMUNITY): Payer: Commercial Managed Care - HMO

## 2021-10-04 ENCOUNTER — Other Ambulatory Visit: Payer: Self-pay

## 2021-10-04 ENCOUNTER — Inpatient Hospital Stay (HOSPITAL_COMMUNITY)
Admission: EM | Admit: 2021-10-04 | Discharge: 2021-10-28 | DRG: 871 | Disposition: A | Discharge status: Home Health | Payer: Commercial Managed Care - HMO | Attending: Internal Medicine | Admitting: Internal Medicine

## 2021-10-04 ENCOUNTER — Encounter (HOSPITAL_COMMUNITY): Payer: Self-pay | Admitting: Emergency Medicine

## 2021-10-04 DIAGNOSIS — N170 Acute kidney failure with tubular necrosis: Secondary | ICD-10-CM | POA: Diagnosis present

## 2021-10-04 DIAGNOSIS — I255 Ischemic cardiomyopathy: Secondary | ICD-10-CM | POA: Diagnosis present

## 2021-10-04 DIAGNOSIS — K7460 Unspecified cirrhosis of liver: Secondary | ICD-10-CM | POA: Diagnosis present

## 2021-10-04 DIAGNOSIS — R197 Diarrhea, unspecified: Secondary | ICD-10-CM | POA: Diagnosis present

## 2021-10-04 DIAGNOSIS — Z87891 Personal history of nicotine dependence: Secondary | ICD-10-CM

## 2021-10-04 DIAGNOSIS — Q8901 Asplenia (congenital): Secondary | ICD-10-CM

## 2021-10-04 DIAGNOSIS — E119 Type 2 diabetes mellitus without complications: Secondary | ICD-10-CM

## 2021-10-04 DIAGNOSIS — Z905 Acquired absence of kidney: Secondary | ICD-10-CM

## 2021-10-04 DIAGNOSIS — Z955 Presence of coronary angioplasty implant and graft: Secondary | ICD-10-CM

## 2021-10-04 DIAGNOSIS — R7881 Bacteremia: Secondary | ICD-10-CM

## 2021-10-04 DIAGNOSIS — K7469 Other cirrhosis of liver: Secondary | ICD-10-CM | POA: Diagnosis present

## 2021-10-04 DIAGNOSIS — Z888 Allergy status to other drugs, medicaments and biological substances status: Secondary | ICD-10-CM

## 2021-10-04 DIAGNOSIS — M25512 Pain in left shoulder: Secondary | ICD-10-CM | POA: Diagnosis not present

## 2021-10-04 DIAGNOSIS — K7581 Nonalcoholic steatohepatitis (NASH): Secondary | ICD-10-CM | POA: Diagnosis present

## 2021-10-04 DIAGNOSIS — E8881 Metabolic syndrome: Secondary | ICD-10-CM | POA: Diagnosis present

## 2021-10-04 DIAGNOSIS — A4151 Sepsis due to Escherichia coli [E. coli]: Principal | ICD-10-CM | POA: Diagnosis present

## 2021-10-04 DIAGNOSIS — E1165 Type 2 diabetes mellitus with hyperglycemia: Secondary | ICD-10-CM

## 2021-10-04 DIAGNOSIS — E785 Hyperlipidemia, unspecified: Secondary | ICD-10-CM | POA: Diagnosis present

## 2021-10-04 DIAGNOSIS — D696 Thrombocytopenia, unspecified: Secondary | ICD-10-CM | POA: Diagnosis present

## 2021-10-04 DIAGNOSIS — R6521 Severe sepsis with septic shock: Secondary | ICD-10-CM | POA: Diagnosis present

## 2021-10-04 DIAGNOSIS — I2510 Atherosclerotic heart disease of native coronary artery without angina pectoris: Secondary | ICD-10-CM | POA: Diagnosis present

## 2021-10-04 DIAGNOSIS — A419 Sepsis, unspecified organism: Secondary | ICD-10-CM | POA: Diagnosis present

## 2021-10-04 DIAGNOSIS — E1169 Type 2 diabetes mellitus with other specified complication: Secondary | ICD-10-CM

## 2021-10-04 DIAGNOSIS — F419 Anxiety disorder, unspecified: Secondary | ICD-10-CM | POA: Diagnosis present

## 2021-10-04 DIAGNOSIS — E876 Hypokalemia: Secondary | ICD-10-CM | POA: Diagnosis present

## 2021-10-04 DIAGNOSIS — N12 Tubulo-interstitial nephritis, not specified as acute or chronic: Secondary | ICD-10-CM

## 2021-10-04 DIAGNOSIS — Z1152 Encounter for screening for COVID-19: Secondary | ICD-10-CM

## 2021-10-04 DIAGNOSIS — R002 Palpitations: Secondary | ICD-10-CM | POA: Diagnosis present

## 2021-10-04 DIAGNOSIS — Z7984 Long term (current) use of oral hypoglycemic drugs: Secondary | ICD-10-CM

## 2021-10-04 DIAGNOSIS — E11319 Type 2 diabetes mellitus with unspecified diabetic retinopathy without macular edema: Secondary | ICD-10-CM | POA: Diagnosis present

## 2021-10-04 DIAGNOSIS — Z9081 Acquired absence of spleen: Secondary | ICD-10-CM

## 2021-10-04 DIAGNOSIS — R778 Other specified abnormalities of plasma proteins: Secondary | ICD-10-CM | POA: Diagnosis present

## 2021-10-04 DIAGNOSIS — D6959 Other secondary thrombocytopenia: Secondary | ICD-10-CM | POA: Diagnosis present

## 2021-10-04 DIAGNOSIS — R112 Nausea with vomiting, unspecified: Secondary | ICD-10-CM | POA: Diagnosis not present

## 2021-10-04 DIAGNOSIS — E11649 Type 2 diabetes mellitus with hypoglycemia without coma: Secondary | ICD-10-CM | POA: Diagnosis not present

## 2021-10-04 DIAGNOSIS — I248 Other forms of acute ischemic heart disease: Secondary | ICD-10-CM | POA: Diagnosis present

## 2021-10-04 DIAGNOSIS — R7989 Other specified abnormal findings of blood chemistry: Secondary | ICD-10-CM | POA: Diagnosis present

## 2021-10-04 DIAGNOSIS — E861 Hypovolemia: Secondary | ICD-10-CM | POA: Diagnosis present

## 2021-10-04 DIAGNOSIS — Z7982 Long term (current) use of aspirin: Secondary | ICD-10-CM

## 2021-10-04 DIAGNOSIS — N151 Renal and perinephric abscess: Secondary | ICD-10-CM | POA: Diagnosis not present

## 2021-10-04 DIAGNOSIS — K5900 Constipation, unspecified: Secondary | ICD-10-CM | POA: Diagnosis not present

## 2021-10-04 DIAGNOSIS — E669 Obesity, unspecified: Secondary | ICD-10-CM

## 2021-10-04 DIAGNOSIS — Z8261 Family history of arthritis: Secondary | ICD-10-CM

## 2021-10-04 DIAGNOSIS — D75838 Other thrombocytosis: Secondary | ICD-10-CM | POA: Diagnosis present

## 2021-10-04 DIAGNOSIS — N390 Urinary tract infection, site not specified: Secondary | ICD-10-CM

## 2021-10-04 DIAGNOSIS — Z794 Long term (current) use of insulin: Secondary | ICD-10-CM

## 2021-10-04 DIAGNOSIS — I5021 Acute systolic (congestive) heart failure: Secondary | ICD-10-CM

## 2021-10-04 DIAGNOSIS — Z833 Family history of diabetes mellitus: Secondary | ICD-10-CM

## 2021-10-04 DIAGNOSIS — M797 Fibromyalgia: Secondary | ICD-10-CM | POA: Diagnosis present

## 2021-10-04 DIAGNOSIS — K219 Gastro-esophageal reflux disease without esophagitis: Secondary | ICD-10-CM | POA: Diagnosis present

## 2021-10-04 DIAGNOSIS — B999 Unspecified infectious disease: Secondary | ICD-10-CM

## 2021-10-04 DIAGNOSIS — E8809 Other disorders of plasma-protein metabolism, not elsewhere classified: Secondary | ICD-10-CM

## 2021-10-04 DIAGNOSIS — E860 Dehydration: Secondary | ICD-10-CM | POA: Diagnosis present

## 2021-10-04 DIAGNOSIS — Z841 Family history of disorders of kidney and ureter: Secondary | ICD-10-CM

## 2021-10-04 DIAGNOSIS — Z885 Allergy status to narcotic agent status: Secondary | ICD-10-CM

## 2021-10-04 DIAGNOSIS — R6520 Severe sepsis without septic shock: Secondary | ICD-10-CM

## 2021-10-04 DIAGNOSIS — Z79899 Other long term (current) drug therapy: Secondary | ICD-10-CM

## 2021-10-04 DIAGNOSIS — I110 Hypertensive heart disease with heart failure: Secondary | ICD-10-CM | POA: Diagnosis present

## 2021-10-04 DIAGNOSIS — D638 Anemia in other chronic diseases classified elsewhere: Secondary | ICD-10-CM | POA: Diagnosis present

## 2021-10-04 DIAGNOSIS — K766 Portal hypertension: Secondary | ICD-10-CM | POA: Diagnosis present

## 2021-10-04 DIAGNOSIS — I5023 Acute on chronic systolic (congestive) heart failure: Secondary | ICD-10-CM | POA: Diagnosis not present

## 2021-10-04 DIAGNOSIS — E872 Acidosis, unspecified: Secondary | ICD-10-CM | POA: Diagnosis present

## 2021-10-04 DIAGNOSIS — J9601 Acute respiratory failure with hypoxia: Secondary | ICD-10-CM

## 2021-10-04 DIAGNOSIS — I252 Old myocardial infarction: Secondary | ICD-10-CM

## 2021-10-04 DIAGNOSIS — N179 Acute kidney failure, unspecified: Principal | ICD-10-CM

## 2021-10-04 DIAGNOSIS — E1140 Type 2 diabetes mellitus with diabetic neuropathy, unspecified: Secondary | ICD-10-CM | POA: Diagnosis present

## 2021-10-04 DIAGNOSIS — D72829 Elevated white blood cell count, unspecified: Secondary | ICD-10-CM | POA: Diagnosis present

## 2021-10-04 DIAGNOSIS — D6489 Other specified anemias: Secondary | ICD-10-CM | POA: Diagnosis not present

## 2021-10-04 DIAGNOSIS — Z6830 Body mass index (BMI) 30.0-30.9, adult: Secondary | ICD-10-CM

## 2021-10-04 DIAGNOSIS — D8481 Immunodeficiency due to conditions classified elsewhere: Secondary | ICD-10-CM | POA: Diagnosis present

## 2021-10-04 DIAGNOSIS — B9620 Unspecified Escherichia coli [E. coli] as the cause of diseases classified elsewhere: Secondary | ICD-10-CM

## 2021-10-04 DIAGNOSIS — Z90710 Acquired absence of both cervix and uterus: Secondary | ICD-10-CM

## 2021-10-04 DIAGNOSIS — I959 Hypotension, unspecified: Secondary | ICD-10-CM | POA: Diagnosis present

## 2021-10-04 LAB — BASIC METABOLIC PANEL
Anion gap: 20 — ABNORMAL HIGH (ref 5–15)
BUN: 85 mg/dL — ABNORMAL HIGH (ref 6–20)
CO2: 12 mmol/L — ABNORMAL LOW (ref 22–32)
Calcium: 7.7 mg/dL — ABNORMAL LOW (ref 8.9–10.3)
Chloride: 99 mmol/L (ref 98–111)
Creatinine, Ser: 3.07 mg/dL — ABNORMAL HIGH (ref 0.44–1.00)
GFR, Estimated: 17 mL/min — ABNORMAL LOW (ref >60)
Glucose, Bld: 311 mg/dL — ABNORMAL HIGH (ref 70–99)
Potassium: 4 mmol/L (ref 3.5–5.1)
Sodium: 131 mmol/L — ABNORMAL LOW (ref 135–145)

## 2021-10-04 LAB — PROTIME-INR
INR: 1.2 (ref 0.8–1.2)
Prothrombin Time: 14.9 seconds (ref 11.4–15.2)

## 2021-10-04 LAB — TROPONIN I (HIGH SENSITIVITY): Troponin I (High Sensitivity): 125 ng/L (ref <18)

## 2021-10-04 LAB — I-STAT BETA HCG BLOOD, ED (MC, WL, AP ONLY): I-stat hCG, quantitative: 12.8 m[IU]/mL — ABNORMAL HIGH (ref <5)

## 2021-10-04 MED ORDER — SODIUM CHLORIDE 0.9 % IV BOLUS
1000.0000 mL | Freq: Once | INTRAVENOUS | Status: AC
  Administered 2021-10-04: 1000 mL via INTRAVENOUS

## 2021-10-04 NOTE — ED Provider Notes (Signed)
[facility] EMERGENCY DEPARTMENT Provider Note   CSN: [id number] Arrival date & time: 10/04/21  2232     History  Chief Complaint  Patient presents with   Chest Pain    Hx. CAD/Coronary Stent   Hypotensive    [name] is a 56 y.o. female.  HPI     This is a 56 year old female with a history of coronary artery disease who presents with nausea, vomiting, diarrhea, palpitations.  Patient reports that she has had vomiting and diarrhea since Tuesday.  She has generally had generalized body aches since that time.  She began to experience palpitations and chest pressure tonight.  She states that it is different than her prior heart attack but that it scared her.  She has some shortness of breath.  Has not had any fevers.  Home Medications Prior to Admission medications   Medication Sig Start Date End Date Taking? Authorizing Provider  alprazolam Duanne Moron) 2 MG tablet Take 2 mg by mouth 2 (two) times daily as needed for sleep or anxiety.    [provider]  amitriptyline (ELAVIL) 50 MG tablet Take 2 tablets at bedtime Patient taking differently: Take 10 mg by mouth at bedtime.  06/20/14   Cameron Sprang, MD  aspirin EC 81 MG tablet Take 1 tablet (81 mg total) by mouth daily. 03/06/14   Dunn, Nedra Hai, PA-C  benzonatate (TESSALON) 200 MG capsule Take 1 capsule (200 mg total) by mouth 3 (three) times daily as needed for cough. 09/19/14   Juanito Doom, MD  dexlansoprazole (DEXILANT) 60 MG capsule Take 60 mg by mouth daily.    [provider]  insulin aspart (NOVOLOG) 100 UNIT/ML injection Inject 30 Units into the skin 2 (two) times daily. 10 units twice a day    [provider]  Insulin Glargine (LANTUS SOLOSTAR) 100 UNIT/ML Solostar Pen Inject 50 Units into the skin 2 (two) times daily.    [provider]  nebivolol (BYSTOLIC) 10 MG tablet Take 1 tablet (10 mg total) by mouth daily. 09/13/14   [place], Peter M, MD  nitroGLYCERIN  (NITROSTAT) 0.4 MG SL tablet Place 1 tablet (0.4 mg total) under the tongue every 5 (five) minutes as needed for chest pain. 03/28/14   Isaiah Serge, NP  prasugrel (EFFIENT) 10 MG TABS tablet Take 1 tablet (10 mg total) by mouth daily. 03/07/14   Dunn, Nedra Hai, PA-C  pravastatin (PRAVACHOL) 80 MG tablet Take 1 tablet (80 mg total) by mouth daily. 03/06/14   Dunn, Nedra Hai, PA-C  pregabalin (LYRICA) 300 MG capsule Take 1 capsule (300 mg total) by mouth 2 (two) times daily. 08/21/13   [place], Modena Nunnery, MD  sitaGLIPtin-metformin (JANUMET) 50-1000 MG per tablet Take 1 tablet by mouth 2 (two) times daily with a meal.    [provider]  traMADol (ULTRAM) 50 MG tablet Take 1 tablet (50 mg total) by mouth every 6 (six) hours as needed. 11/05/16   Long, Wonda Olds, MD      Allergies    Aspartame and phenylalanine, Toradol [ketorolac tromethamine], Sucralose, Dilaudid [hydromorphone hcl], Hydrocodone, and Oxycodone    Review of Systems   Review of Systems  Constitutional:  Negative for fever.  Respiratory:  Positive for chest tightness.   Cardiovascular:  Positive for palpitations.  Gastrointestinal:  Positive for diarrhea, nausea and vomiting. Negative for abdominal pain.  All other systems reviewed and are negative.   Physical Exam Updated Vital Signs BP (!) 85/53  Pulse 86   Temp 97.7 F (36.5 C) (Oral)   Resp 20   SpO2 99%  Physical Exam Vitals and nursing note reviewed.  Constitutional:      Appearance: She is well-developed. She is not ill-appearing.  HENT:     Head: Normocephalic and atraumatic.     Mouth/Throat:     Mouth: Mucous membranes are dry.  Eyes:     Pupils: Pupils are equal, round, and reactive to light.  Cardiovascular:     Rate and Rhythm: Normal rate and regular rhythm.     Heart sounds: Normal heart sounds.  Pulmonary:     Effort: Pulmonary effort is normal. No respiratory distress.     Breath sounds: No wheezing.  Abdominal:     General: Bowel sounds  are normal.     Palpations: Abdomen is soft.  Musculoskeletal:     Cervical back: Neck supple.     Right lower leg: No edema.     Left lower leg: No edema.  Skin:    General: Skin is warm and dry.  Neurological:     Mental Status: She is alert and oriented to person, place, and time.  Psychiatric:        Mood and Affect: Mood normal.     ED Results / Procedures / Treatments   Labs (all labs ordered are listed, but only abnormal results are displayed) Labs Reviewed  BASIC METABOLIC PANEL - Abnormal; Notable for the following components:      Result Value   Sodium 131 (*)    CO2 12 (*)    Glucose, Bld 311 (*)    BUN 85 (*)    Creatinine, Ser 3.07 (*)    Calcium 7.7 (*)    GFR, Estimated 17 (*)    Anion gap 20 (*)    All other components within normal limits  CBC - Abnormal; Notable for the following components:   WBC 12.1 (*)    Platelets 27 (*)    nRBC 1.0 (*)    All other components within normal limits  HEPATIC FUNCTION PANEL - Abnormal; Notable for the following components:   Total Protein 6.3 (*)    Albumin 2.0 (*)    Alkaline Phosphatase 136 (*)    All other components within normal limits  CK - Abnormal; Notable for the following components:   Total CK 22 (*)    All other components within normal limits  URINALYSIS, ROUTINE W REFLEX MICROSCOPIC - Abnormal; Notable for the following components:   APPearance TURBID (*)    Glucose, UA 50 (*)    Hgb urine dipstick LARGE (*)    Protein, ur 100 (*)    Leukocytes,Ua LARGE (*)    WBC, UA >50 (*)    Bacteria, UA MANY (*)    All other components within normal limits  I-STAT BETA HCG BLOOD, ED ([facility], [facility], [facility] ONLY) - Abnormal; Notable for the following components:   I-stat hCG, quantitative 12.8 (*)    All other components within normal limits  TROPONIN I (HIGH SENSITIVITY) - Abnormal; Notable for the following components:   Troponin I (High Sensitivity) 125 (*)    All other components within normal limits  TROPONIN I  (HIGH SENSITIVITY) - Abnormal; Notable for the following components:   Troponin I (High Sensitivity) 60 (*)    All other components within normal limits  CULTURE, BLOOD (ROUTINE X 2)  CULTURE, BLOOD (ROUTINE X 2)  SARS CORONAVIRUS 2 BY RT PCR  URINE  CULTURE  PROTIME-INR  LIPASE, BLOOD  LACTIC ACID, PLASMA  LACTIC ACID, PLASMA    EKG EKG Interpretation  Date/Time:  Saturday October 04 2021 23:32:58 EDT Ventricular Rate:  84 PR Interval:  153 QRS Duration: 102 QT Interval:  376 QTC Calculation: 445 R Axis:   69 Text Interpretation: Sinus rhythm Consider left atrial enlargement RSR' in V1 or V2, right VCD or RVH Confirmed by Thayer Jew [phone]) on 10/04/2021 11:52:07 PM  Radiology CT ABDOMEN PELVIS WO CONTRAST  Result Date: 10/05/2021 CLINICAL DATA:  Nausea and vomiting.  Hypotensive. EXAM: CT ABDOMEN AND PELVIS WITHOUT CONTRAST TECHNIQUE: Multidetector CT imaging of the abdomen and pelvis was performed following the standard protocol without IV contrast. RADIATION DOSE REDUCTION: This exam was performed according to the departmental dose-optimization program which includes automated exposure control, adjustment of the mA and/or kV according to patient size and/or use of iterative reconstruction technique. COMPARISON:  01/19/2012 FINDINGS: Lower chest: Atelectasis in the lung bases. Small esophageal hiatal hernia. Hepatobiliary: Hepatic changes suggest cirrhosis with enlarged lateral segment left and caudate lobes. Serpiginous portal and upper abdominal vessel suggesting varices. Gallbladder and bile ducts are unremarkable. Pancreas: Unremarkable. No pancreatic ductal dilatation or surrounding inflammatory changes. Spleen: Surgical absence of the spleen. Adrenals/Urinary Tract: No adrenal gland nodules. Sub capsular and soft tissue gas collections demonstrated within the right kidney, likely indicating infection with gas-forming organism. The estrogenic or traumatic causes could also have  this appearance. Correlate with history of any biopsy or other procedure. No hydronephrosis or hydroureter. Bladder is normal. Stomach/Bowel: Stomach is within normal limits. Appendix appears normal. No evidence of bowel wall thickening, distention, or inflammatory changes. Vascular/Lymphatic: Calcification of the aorta. No aneurysm. Prominent lymph nodes demonstrated in the celiac axis, mesenteric, and retroperitoneum. Largest measure about 1.3 cm short axis dimension. Similar appearance to previous study, likely indicating reactive or cirrhotic related nodes. Reproductive: Status post hysterectomy. No adnexal masses. Other: No free air or free fluid in the abdomen. Inflammatory stranding is demonstrated along the right pericolic gutters. Musculoskeletal: No acute or significant osseous findings. IMPRESSION: 1. Subcapsular and parenchymal gas collections within the right kidney. Unless there is been recent biopsy or other procedure, this is likely to represent infection with gas-forming organism. Inflammatory stranding suggested along the right pericolic gutter. 2. 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 3. Aortic atherosclerosis. Electronically Signed   By: Lucienne Capers M.D.   On: 10/05/2021 01:35   DG Chest 2 View  Result Date: 10/04/2021 CLINICAL DATA:  Chest pain, vomiting, weakness EXAM: CHEST - 2 VIEW COMPARISON:  09/19/2014 FINDINGS: Normal heart size and pulmonary vascularity. No focal airspace disease or consolidation in the lungs. No blunting of costophrenic angles. No pneumothorax. Mediastinal contours appear intact. Coronary stents are demonstrated. Calcification of the aorta. IMPRESSION: No active cardiopulmonary disease. Electronically Signed   By: Lucienne Capers M.D.   On: 10/04/2021 23:14    Procedures .Critical Care  Performed by: Merryl Hacker, MD Authorized by: Merryl Hacker, MD   Critical care provider statement:    Critical  care time (minutes):  60   Critical care was necessary to treat or prevent imminent or life-threatening deterioration of the following conditions:  Sepsis and renal failure   Critical care was time spent personally by me on the following activities:  Development of treatment plan with patient or surrogate, discussions with consultants, evaluation of patient's response to treatment, examination of patient, ordering and review of laboratory studies, ordering  and review of radiographic studies, ordering and performing treatments and interventions, pulse oximetry, re-evaluation of patient's condition and review of old charts     Medications Ordered in ED Medications  0.9 %  sodium chloride infusion (has no administration in time range)  cefTRIAXone (ROCEPHIN) 1 g in sodium chloride 0.9 % 100 mL IVPB (1 g Intravenous New Bag/Given 10/05/21 0304)  lactated ringers infusion (has no administration in time range)  sodium chloride 0.9 % bolus 1,000 mL (0 mLs Intravenous Stopped 10/05/21 0031)  sodium chloride 0.9 % bolus 1,000 mL (0 mLs Intravenous Stopped 10/05/21 0119)  sodium chloride 0.9 % bolus 1,000 mL (1,000 mLs Intravenous New Bag/Given 10/05/21 0304)    ED Course/ Medical Decision Making/ A&P Clinical Course as of 10/05/21 0317  Sun Oct 05, 2021  0254 CT imaging showing air in the kidney.  Patient is without any urinary symptoms.  Will obtain urinalysis.  Added blood cultures and lactate given concern for persistent borderline blood pressures and concern for possible infection.  Also notably thrombocytopenic. [CH]  0307 Urinalysis shows large leukocyte esterase, greater than 50 white cells and many bacteria.  Given CT findings, feel this is likely consistent with UTI although patient is relatively asymptomatic.  Lactate and blood cultures are pending.  IV Rocephin ordered.  Patient's blood pressures improved with initial fluid bolus however have trended back downwards.  MAP remains greater than 65.   However, will give 1 additional liter of saline to complete 30 cc/kg of fluid.  She will be started on normal saline infusion.  After full clinical evaluation, suspect her symptoms may be related to severe sepsis from a UTI. [CH]    Clinical Course User Index [CH] Kael Keetch, Barbette Hair, MD                           Medical Decision Making Amount and/or Complexity of Data Reviewed Labs: ordered. Radiology: ordered.  Risk Prescription drug management. Decision regarding hospitalization.   This patient presents to the ED for concern of chest pain, nausea, vomiting, diarrhea, this involves an extensive number of treatment options, and is a complaint that carries with it a high risk of complications and morbidity.  I considered the following differential and admission for this acute, potentially life threatening condition.  The differential diagnosis includes ACS, acute viral or infectious process, obstruction, gastroenteritis  MDM:    This is a 57 year old female who presents with palpitations.  This is in the setting of recent nausea, vomiting, diarrhea.  She is overall nontoxic-appearing.  She is afebrile.  Initial blood pressure 83/51 although she appears well perfused.  Reports recent history of vomiting and diarrhea.  Likely some element of dehydration.  She states that her chest symptoms are different than her prior MI.  EKG shows no evidence of acute arrhythmia or ischemia.  Initial troponin is 125; however, she also has an acutely elevated creatinine at 3.  Repeat troponin 60.  Doubt primary ACS.  Patient also notably thrombocytopenic.  She has a slight leukocytosis.  She was fluid responsive with 2 L of fluid and blood pressures rose to 193 systolic; however, after fluids stopped, patient dropped her pressures again.  For this reason, additional work-up was initiated.  This included CT scan, blood cultures, urinalysis, urine culture.  CT concerning for gas in the kidney.  Patient has no urinary  symptoms.  However, given higher concern now for sepsis given clinical picture and lab work, she  was given Rocephin and blood and urine cultures were obtained.  Lactate is pending.  Urinalysis does show greater than 50 white cells and many bacteria.  Patient meets criteria for severe sepsis.  She ultimately has received 30 cc/kg fluid.  She is on a normal saline infusion.  Most recently maps ranging from 63-71.  We will continue aggressive fluid resuscitation.  Will not initiate pressors at this point.  Patient discussed with hospitalist, Dr. Myna Hidalgo.  We will plan for admission.  (Labs, imaging, consults)  Labs: I Ordered, and personally interpreted labs.  The pertinent results include: CBC, BMP, urinalysis, blood cultures, troponin  Imaging Studies ordered: I ordered imaging studies including chest x-ray, CT abdomen pelvis I independently visualized and interpreted imaging. I agree with the radiologist interpretation  Additional history obtained from chart review.  External records from outside source obtained and reviewed including prior records and labs  Cardiac Monitoring: The patient was maintained on a cardiac monitor.  I personally viewed and interpreted the cardiac monitored which showed an underlying rhythm of: Normal sinus rhythm  Reevaluation: After the interventions noted above, I reevaluated the patient and found that they have :improved  Social Determinants of Health: Lives independently  Disposition: Admit  Co morbidities that complicate the patient evaluation  Past Medical History:  Diagnosis Date   Anxiety    CAD (coronary artery disease)    a.  Inferior STEMI / CAD s/p DES to prox RCA 03/03/14, staged cath 03/05/14 for LAD showing significantly improved stenosis of mid LAD with resolution of prior thrombus.   Clostridium difficile colitis    Diabetes mellitus    Dyslipidemia    Fibromyalgia    GERD (gastroesophageal reflux disease)    H/O hiatal hernia     Ischemic cardiomyopathy    a. EF 45-50% by echo 03/04/14 with suspected mild acute diastolic CHF s/p dose of IV Lasix.   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   Sinus bradycardia    a. During 02/2014 admit, not on BB due to this.   Upper respiratory infection 2/15   states has dry non productive cough since then- "tickle in my throat" no fever     Medicines Meds ordered this encounter  Medications   sodium chloride 0.9 % bolus 1,000 mL   sodium chloride 0.9 % bolus 1,000 mL   0.9 %  sodium chloride infusion   cefTRIAXone (ROCEPHIN) 1 g in sodium chloride 0.9 % 100 mL IVPB    Order Specific Question:   Antibiotic Indication:    Answer:   UTI   sodium chloride 0.9 % bolus 1,000 mL   lactated ringers infusion    I have reviewed the patients home medicines and have made adjustments as needed  Problem List / ED Course: Problem List Items Addressed This Visit       Hematopoietic and Hemostatic   Thrombocytopenia ([place])   Other Visit Diagnoses     AKI (acute kidney injury) ([place])    -  Primary   Palpitations       Severe sepsis ([place])       Urinary tract infection without hematuria, site unspecified       Relevant Medications   cefTRIAXone (ROCEPHIN) 1 g in sodium chloride 0.9 % 100 mL IVPB  Final Clinical Impression(s) / ED Diagnoses Final diagnoses:  AKI (acute kidney injury) ([place])  Palpitations  Thrombocytopenia ([place])  Severe sepsis ([facility])  Urinary tract infection without hematuria, site unspecified    Rx / DC Orders ED Discharge Orders     None         Sieara Bremer, Barbette Hair, MD 10/05/21 [phone]

## 2021-10-04 NOTE — ED Notes (Signed)
BP remains soft. MAP 64. Pt remains alert and oriented, though tired. MD notified. 2nd L ordered.

## 2021-10-04 NOTE — ED Triage Notes (Signed)
Patient arrived with EMS from home reports left chest pain with mild SOB this morning , No emesis or diaphoresis , her cardiologist is Dr. Mamie Nick. [place] . Hypotensive at triage . She received 1 liter IV bolus by EMS . History of CAD/coronary stent .

## 2021-10-05 ENCOUNTER — Emergency Department (HOSPITAL_COMMUNITY): Payer: Commercial Managed Care - HMO

## 2021-10-05 ENCOUNTER — Encounter (HOSPITAL_COMMUNITY): Payer: Self-pay | Admitting: Family Medicine

## 2021-10-05 DIAGNOSIS — N179 Acute kidney failure, unspecified: Secondary | ICD-10-CM | POA: Diagnosis not present

## 2021-10-05 DIAGNOSIS — I2510 Atherosclerotic heart disease of native coronary artery without angina pectoris: Secondary | ICD-10-CM | POA: Diagnosis not present

## 2021-10-05 DIAGNOSIS — N151 Renal and perinephric abscess: Secondary | ICD-10-CM | POA: Diagnosis not present

## 2021-10-05 DIAGNOSIS — E119 Type 2 diabetes mellitus without complications: Secondary | ICD-10-CM | POA: Diagnosis not present

## 2021-10-05 DIAGNOSIS — N12 Tubulo-interstitial nephritis, not specified as acute or chronic: Secondary | ICD-10-CM

## 2021-10-05 DIAGNOSIS — E1165 Type 2 diabetes mellitus with hyperglycemia: Secondary | ICD-10-CM | POA: Diagnosis present

## 2021-10-05 DIAGNOSIS — E8881 Metabolic syndrome: Secondary | ICD-10-CM | POA: Diagnosis present

## 2021-10-05 DIAGNOSIS — R6520 Severe sepsis without septic shock: Secondary | ICD-10-CM | POA: Diagnosis not present

## 2021-10-05 DIAGNOSIS — K7469 Other cirrhosis of liver: Secondary | ICD-10-CM | POA: Diagnosis present

## 2021-10-05 DIAGNOSIS — Z794 Long term (current) use of insulin: Secondary | ICD-10-CM

## 2021-10-05 DIAGNOSIS — K7460 Unspecified cirrhosis of liver: Secondary | ICD-10-CM | POA: Diagnosis present

## 2021-10-05 DIAGNOSIS — R778 Other specified abnormalities of plasma proteins: Secondary | ICD-10-CM | POA: Diagnosis not present

## 2021-10-05 DIAGNOSIS — N390 Urinary tract infection, site not specified: Secondary | ICD-10-CM | POA: Insufficient documentation

## 2021-10-05 DIAGNOSIS — I248 Other forms of acute ischemic heart disease: Secondary | ICD-10-CM | POA: Diagnosis present

## 2021-10-05 DIAGNOSIS — E11319 Type 2 diabetes mellitus with unspecified diabetic retinopathy without macular edema: Secondary | ICD-10-CM | POA: Diagnosis present

## 2021-10-05 DIAGNOSIS — E8809 Other disorders of plasma-protein metabolism, not elsewhere classified: Secondary | ICD-10-CM | POA: Diagnosis present

## 2021-10-05 DIAGNOSIS — D8481 Immunodeficiency due to conditions classified elsewhere: Secondary | ICD-10-CM | POA: Diagnosis present

## 2021-10-05 DIAGNOSIS — N170 Acute kidney failure with tubular necrosis: Secondary | ICD-10-CM | POA: Diagnosis present

## 2021-10-05 DIAGNOSIS — E861 Hypovolemia: Secondary | ICD-10-CM

## 2021-10-05 DIAGNOSIS — I428 Other cardiomyopathies: Secondary | ICD-10-CM | POA: Diagnosis not present

## 2021-10-05 DIAGNOSIS — I5021 Acute systolic (congestive) heart failure: Secondary | ICD-10-CM | POA: Diagnosis not present

## 2021-10-05 DIAGNOSIS — D696 Thrombocytopenia, unspecified: Secondary | ICD-10-CM | POA: Diagnosis present

## 2021-10-05 DIAGNOSIS — D6959 Other secondary thrombocytopenia: Secondary | ICD-10-CM | POA: Diagnosis present

## 2021-10-05 DIAGNOSIS — R6521 Severe sepsis with septic shock: Secondary | ICD-10-CM | POA: Diagnosis present

## 2021-10-05 DIAGNOSIS — E11649 Type 2 diabetes mellitus with hypoglycemia without coma: Secondary | ICD-10-CM | POA: Diagnosis not present

## 2021-10-05 DIAGNOSIS — E872 Acidosis, unspecified: Secondary | ICD-10-CM | POA: Diagnosis present

## 2021-10-05 DIAGNOSIS — K766 Portal hypertension: Secondary | ICD-10-CM | POA: Diagnosis present

## 2021-10-05 DIAGNOSIS — I9589 Other hypotension: Secondary | ICD-10-CM

## 2021-10-05 DIAGNOSIS — I110 Hypertensive heart disease with heart failure: Secondary | ICD-10-CM | POA: Diagnosis present

## 2021-10-05 DIAGNOSIS — I5023 Acute on chronic systolic (congestive) heart failure: Secondary | ICD-10-CM | POA: Diagnosis not present

## 2021-10-05 DIAGNOSIS — A419 Sepsis, unspecified organism: Secondary | ICD-10-CM | POA: Diagnosis not present

## 2021-10-05 DIAGNOSIS — A4151 Sepsis due to Escherichia coli [E. coli]: Secondary | ICD-10-CM | POA: Diagnosis present

## 2021-10-05 DIAGNOSIS — D638 Anemia in other chronic diseases classified elsewhere: Secondary | ICD-10-CM | POA: Diagnosis present

## 2021-10-05 DIAGNOSIS — Z1152 Encounter for screening for COVID-19: Secondary | ICD-10-CM | POA: Diagnosis not present

## 2021-10-05 DIAGNOSIS — R7881 Bacteremia: Secondary | ICD-10-CM | POA: Diagnosis not present

## 2021-10-05 DIAGNOSIS — J9601 Acute respiratory failure with hypoxia: Secondary | ICD-10-CM | POA: Diagnosis not present

## 2021-10-05 DIAGNOSIS — E1140 Type 2 diabetes mellitus with diabetic neuropathy, unspecified: Secondary | ICD-10-CM | POA: Diagnosis present

## 2021-10-05 DIAGNOSIS — E669 Obesity, unspecified: Secondary | ICD-10-CM | POA: Diagnosis present

## 2021-10-05 DIAGNOSIS — K7581 Nonalcoholic steatohepatitis (NASH): Secondary | ICD-10-CM | POA: Diagnosis present

## 2021-10-05 DIAGNOSIS — R112 Nausea with vomiting, unspecified: Secondary | ICD-10-CM | POA: Diagnosis present

## 2021-10-05 DIAGNOSIS — B9620 Unspecified Escherichia coli [E. coli] as the cause of diseases classified elsewhere: Secondary | ICD-10-CM | POA: Diagnosis not present

## 2021-10-05 DIAGNOSIS — D72829 Elevated white blood cell count, unspecified: Secondary | ICD-10-CM | POA: Diagnosis not present

## 2021-10-05 LAB — BASIC METABOLIC PANEL
Anion gap: 20 — ABNORMAL HIGH (ref 5–15)
Anion gap: 13 (ref 5–15)
BUN: 82 mg/dL — ABNORMAL HIGH (ref 6–20)
BUN: 82 mg/dL — ABNORMAL HIGH (ref 6–20)
CO2: 10 mmol/L — ABNORMAL LOW (ref 22–32)
CO2: 19 mmol/L — ABNORMAL LOW (ref 22–32)
Calcium: 7.3 mg/dL — ABNORMAL LOW (ref 8.9–10.3)
Calcium: 7.1 mg/dL — ABNORMAL LOW (ref 8.9–10.3)
Chloride: 105 mmol/L (ref 98–111)
Chloride: 102 mmol/L (ref 98–111)
Creatinine, Ser: 2.75 mg/dL — ABNORMAL HIGH (ref 0.44–1.00)
Creatinine, Ser: 2.41 mg/dL — ABNORMAL HIGH (ref 0.44–1.00)
GFR, Estimated: 20 mL/min — ABNORMAL LOW (ref >60)
GFR, Estimated: 23 mL/min — ABNORMAL LOW (ref >60)
Glucose, Bld: 199 mg/dL — ABNORMAL HIGH (ref 70–99)
Glucose, Bld: 77 mg/dL (ref 70–99)
Potassium: 4.2 mmol/L (ref 3.5–5.1)
Potassium: 3.4 mmol/L — ABNORMAL LOW (ref 3.5–5.1)
Sodium: 135 mmol/L (ref 135–145)
Sodium: 134 mmol/L — ABNORMAL LOW (ref 135–145)

## 2021-10-05 LAB — CULTURE, BLOOD (ROUTINE X 2)

## 2021-10-05 LAB — COMPREHENSIVE METABOLIC PANEL
ALT: 17 U/L (ref 0–44)
AST: 20 U/L (ref 15–41)
Albumin: 2.3 g/dL — ABNORMAL LOW (ref 3.5–5.0)
Alkaline Phosphatase: 79 U/L (ref 38–126)
Anion gap: 15 (ref 5–15)
BUN: 80 mg/dL — ABNORMAL HIGH (ref 6–20)
CO2: 17 mmol/L — ABNORMAL LOW (ref 22–32)
Calcium: 7 mg/dL — ABNORMAL LOW (ref 8.9–10.3)
Chloride: 102 mmol/L (ref 98–111)
Creatinine, Ser: 2.44 mg/dL — ABNORMAL HIGH (ref 0.44–1.00)
GFR, Estimated: 23 mL/min — ABNORMAL LOW (ref >60)
Glucose, Bld: 99 mg/dL (ref 70–99)
Potassium: 3 mmol/L — ABNORMAL LOW (ref 3.5–5.1)
Sodium: 134 mmol/L — ABNORMAL LOW (ref 135–145)
Total Bilirubin: 1.2 mg/dL (ref 0.3–1.2)
Total Protein: 5.4 g/dL — ABNORMAL LOW (ref 6.5–8.1)

## 2021-10-05 LAB — BLOOD CULTURE ID PANEL (REFLEXED) - BCID2

## 2021-10-05 LAB — URINALYSIS, ROUTINE W REFLEX MICROSCOPIC
Bilirubin Urine: NEGATIVE
Glucose, UA: 50 mg/dL — AB
Ketones, ur: NEGATIVE mg/dL
Nitrite: NEGATIVE
Protein, ur: 100 mg/dL — AB
Specific Gravity, Urine: 1.015 (ref 1.005–1.030)
WBC, UA: >50 WBC/hpf — ABNORMAL HIGH (ref 0–5)
pH: 5 (ref 5.0–8.0)

## 2021-10-05 LAB — TYPE AND SCREEN
ABO/RH(D): O POS
Antibody Screen: NEGATIVE

## 2021-10-05 LAB — CBC
HCT: 29 % — ABNORMAL LOW (ref 36.0–46.0)
HCT: 30.8 % — ABNORMAL LOW (ref 36.0–46.0)
HCT: 36.1 % (ref 36.0–46.0)
HCT: 38.9 % (ref 36.0–46.0)
Hemoglobin: 10.7 g/dL — ABNORMAL LOW (ref 12.0–15.0)
Hemoglobin: 10.9 g/dL — ABNORMAL LOW (ref 12.0–15.0)
Hemoglobin: 12 g/dL (ref 12.0–15.0)
Hemoglobin: 13 g/dL (ref 12.0–15.0)
MCH: 32.8 pg (ref 26.0–34.0)
MCH: 32.9 pg (ref 26.0–34.0)
MCH: 33.1 pg (ref 26.0–34.0)
MCH: 33.1 pg (ref 26.0–34.0)
MCHC: 33.2 g/dL (ref 30.0–36.0)
MCHC: 33.4 g/dL (ref 30.0–36.0)
MCHC: 35.4 g/dL (ref 30.0–36.0)
MCHC: 36.9 g/dL — ABNORMAL HIGH (ref 30.0–36.0)
MCV: 89.8 fL (ref 80.0–100.0)
MCV: 93.1 fL (ref 80.0–100.0)
MCV: 98.2 fL (ref 80.0–100.0)
MCV: 99.4 fL (ref 80.0–100.0)
Platelets: 15 10*3/uL — CL (ref 150–400)
Platelets: 21 10*3/uL — CL (ref 150–400)
Platelets: 21 10*3/uL — CL (ref 150–400)
Platelets: 27 10*3/uL — CL (ref 150–400)
RBC: 3.23 MIL/uL — ABNORMAL LOW (ref 3.87–5.11)
RBC: 3.31 MIL/uL — ABNORMAL LOW (ref 3.87–5.11)
RBC: 3.63 MIL/uL — ABNORMAL LOW (ref 3.87–5.11)
RBC: 3.96 MIL/uL (ref 3.87–5.11)
RDW: 14.2 % (ref 11.5–15.5)
RDW: 14.6 % (ref 11.5–15.5)
RDW: 15.1 % (ref 11.5–15.5)
RDW: 15.5 % (ref 11.5–15.5)
WBC: 10.1 10*3/uL (ref 4.0–10.5)
WBC: 12.1 10*3/uL — ABNORMAL HIGH (ref 4.0–10.5)
WBC: 6.9 10*3/uL (ref 4.0–10.5)
WBC: 8.2 10*3/uL (ref 4.0–10.5)
nRBC: 1 % — ABNORMAL HIGH (ref 0.0–0.2)
nRBC: 1.5 % — ABNORMAL HIGH (ref 0.0–0.2)
nRBC: 2.2 % — ABNORMAL HIGH (ref 0.0–0.2)
nRBC: 2.3 % — ABNORMAL HIGH (ref 0.0–0.2)

## 2021-10-05 LAB — HEPATIC FUNCTION PANEL
ALT: 14 U/L (ref 0–44)
ALT: 20 U/L (ref 0–44)
AST: 18 U/L (ref 15–41)
AST: 18 U/L (ref 15–41)
Albumin: 1.7 g/dL — ABNORMAL LOW (ref 3.5–5.0)
Albumin: 2 g/dL — ABNORMAL LOW (ref 3.5–5.0)
Alkaline Phosphatase: 129 U/L — ABNORMAL HIGH (ref 38–126)
Alkaline Phosphatase: 136 U/L — ABNORMAL HIGH (ref 38–126)
Bilirubin, Direct: 0.2 mg/dL (ref 0.0–0.2)
Bilirubin, Direct: 0.3 mg/dL — ABNORMAL HIGH (ref 0.0–0.2)
Indirect Bilirubin: 0.4 mg/dL (ref 0.3–0.9)
Indirect Bilirubin: 0.4 mg/dL (ref 0.3–0.9)
Total Bilirubin: 0.6 mg/dL (ref 0.3–1.2)
Total Bilirubin: 0.7 mg/dL (ref 0.3–1.2)
Total Protein: 5.5 g/dL — ABNORMAL LOW (ref 6.5–8.1)
Total Protein: 6.3 g/dL — ABNORMAL LOW (ref 6.5–8.1)

## 2021-10-05 LAB — MAGNESIUM
Magnesium: 1.4 mg/dL — ABNORMAL LOW (ref 1.7–2.4)
Magnesium: 1.3 mg/dL — ABNORMAL LOW (ref 1.7–2.4)

## 2021-10-05 LAB — BLOOD GAS, VENOUS
Acid-base deficit: 1.8 mmol/L (ref 0.0–2.0)
Bicarbonate: 21.5 mmol/L (ref 20.0–28.0)
Drawn by: 56089
O2 Saturation: 94.7 %
Patient temperature: 37
pCO2, Ven: 31 mmHg — ABNORMAL LOW (ref 44–60)
pH, Ven: 7.45 — ABNORMAL HIGH (ref 7.25–7.43)
pO2, Ven: 59 mmHg — ABNORMAL HIGH (ref 32–45)

## 2021-10-05 LAB — HEMOGLOBIN A1C
Hgb A1c MFr Bld: 12.5 % — ABNORMAL HIGH (ref 4.8–5.6)
Mean Plasma Glucose: 312.05 mg/dL

## 2021-10-05 LAB — SARS CORONAVIRUS 2 BY RT PCR: SARS Coronavirus 2 by RT PCR: NEGATIVE

## 2021-10-05 LAB — GLUCOSE, CAPILLARY
Glucose-Capillary: 160 mg/dL — ABNORMAL HIGH (ref 70–99)
Glucose-Capillary: 94 mg/dL (ref 70–99)
Glucose-Capillary: 124 mg/dL — ABNORMAL HIGH (ref 70–99)
Glucose-Capillary: 39 mg/dL — CL (ref 70–99)
Glucose-Capillary: 39 mg/dL — CL (ref 70–99)
Glucose-Capillary: 72 mg/dL (ref 70–99)
Glucose-Capillary: 64 mg/dL — ABNORMAL LOW (ref 70–99)
Glucose-Capillary: 104 mg/dL — ABNORMAL HIGH (ref 70–99)

## 2021-10-05 LAB — LACTIC ACID, PLASMA
Lactic Acid, Venous: 6 mmol/L (ref 0.5–1.9)
Lactic Acid, Venous: 0.7 mmol/L (ref 0.5–1.9)

## 2021-10-05 LAB — MRSA NEXT GEN BY PCR, NASAL: MRSA by PCR Next Gen: NOT DETECTED

## 2021-10-05 LAB — CBG MONITORING, ED: Glucose-Capillary: 204 mg/dL — ABNORMAL HIGH (ref 70–99)

## 2021-10-05 LAB — HIV ANTIBODY (ROUTINE TESTING W REFLEX): HIV Screen 4th Generation wRfx: NONREACTIVE

## 2021-10-05 LAB — TROPONIN I (HIGH SENSITIVITY): Troponin I (High Sensitivity): 60 ng/L — ABNORMAL HIGH (ref <18)

## 2021-10-05 LAB — PHOSPHORUS: Phosphorus: 3.9 mg/dL (ref 2.5–4.6)

## 2021-10-05 LAB — CK: Total CK: 22 U/L — ABNORMAL LOW (ref 38–234)

## 2021-10-05 LAB — LIPASE, BLOOD: Lipase: 18 U/L (ref 11–51)

## 2021-10-05 MED ORDER — SODIUM CHLORIDE 0.9 % IV SOLN
1.0000 g | Freq: Once | INTRAVENOUS | Status: AC
  Administered 2021-10-05: 1 g via INTRAVENOUS
  Filled 2021-10-05: qty 10

## 2021-10-05 MED ORDER — LACTATED RINGERS IV BOLUS
1000.0000 mL | Freq: Once | INTRAVENOUS | Status: AC
  Administered 2021-10-05: 1000 mL via INTRAVENOUS

## 2021-10-05 MED ORDER — SODIUM CHLORIDE 0.9 % IV SOLN: Freq: Once | INTRAVENOUS | Status: DC

## 2021-10-05 MED ORDER — POTASSIUM CHLORIDE 10 MEQ/100ML IV SOLN
10.0000 meq | INTRAVENOUS | Status: AC
  Administered 2021-10-05 – 2021-10-06 (×4): 10 meq via INTRAVENOUS
  Filled 2021-10-05 (×4): qty 100

## 2021-10-05 MED ORDER — ALPRAZOLAM 0.5 MG PO TABS: 2.0000 mg | ORAL_TABLET | Freq: Two times a day (BID) | ORAL | Status: DC | PRN

## 2021-10-05 MED ORDER — ONDANSETRON HCL 4 MG/2ML IJ SOLN: 4.0000 mg | Freq: Four times a day (QID) | INTRAMUSCULAR | Status: DC | PRN

## 2021-10-05 MED ORDER — SODIUM CHLORIDE 0.9 % IV SOLN
1.0000 g | Freq: Two times a day (BID) | INTRAVENOUS | Status: DC
  Administered 2021-10-05: 1 g via INTRAVENOUS
  Filled 2021-10-05 (×2): qty 20

## 2021-10-05 MED ORDER — LIP MEDEX EX OINT
TOPICAL_OINTMENT | CUTANEOUS | Status: DC | PRN
  Filled 2021-10-05: qty 7

## 2021-10-05 MED ORDER — STERILE WATER FOR INJECTION IV SOLN
INTRAVENOUS | Status: AC
  Filled 2021-10-05 (×2): qty 1000

## 2021-10-05 MED ORDER — INSULIN DETEMIR 100 UNIT/ML ~~LOC~~ SOLN
15.0000 [IU] | Freq: Two times a day (BID) | SUBCUTANEOUS | Status: DC
Start: 2021-10-05 — End: 2021-10-05
  Administered 2021-10-05: 15 [IU] via SUBCUTANEOUS
  Filled 2021-10-05 (×2): qty 0.15

## 2021-10-05 MED ORDER — SODIUM CHLORIDE 0.9 % IV SOLN: 250.0000 mL | INTRAVENOUS | Status: DC

## 2021-10-05 MED ORDER — SODIUM CHLORIDE 0.9% FLUSH
3.0000 mL | Freq: Two times a day (BID) | INTRAVENOUS | Status: DC
  Administered 2021-10-05 – 2021-10-28 (×33): 3 mL via INTRAVENOUS

## 2021-10-05 MED ORDER — DEXTROSE 50 % IV SOLN
INTRAVENOUS | Status: AC
  Administered 2021-10-05: 12.5 g via INTRAVENOUS
  Filled 2021-10-05: qty 50

## 2021-10-05 MED ORDER — NOREPINEPHRINE 4 MG/250ML-% IV SOLN
0.0000 ug/min | INTRAVENOUS | Status: DC
  Administered 2021-10-06: 3 ug/min via INTRAVENOUS

## 2021-10-05 MED ORDER — LACTATED RINGERS IV BOLUS
500.0000 mL | Freq: Once | INTRAVENOUS | Status: AC
  Administered 2021-10-05: 500 mL via INTRAVENOUS

## 2021-10-05 MED ORDER — DEXTROSE IN LACTATED RINGERS 5 % IV SOLN: INTRAVENOUS | Status: DC

## 2021-10-05 MED ORDER — ASPIRIN 81 MG PO TBEC
81.0000 mg | DELAYED_RELEASE_TABLET | Freq: Every day | ORAL | Status: DC
  Administered 2021-10-05: 81 mg via ORAL
  Filled 2021-10-05: qty 1

## 2021-10-05 MED ORDER — INSULIN ASPART 100 UNIT/ML IJ SOLN
0.0000 [IU] | INTRAMUSCULAR | Status: DC
  Administered 2021-10-05: 2 [IU] via SUBCUTANEOUS
  Administered 2021-10-05 – 2021-10-06 (×2): 1 [IU] via SUBCUTANEOUS
  Administered 2021-10-06 (×2): 2 [IU] via SUBCUTANEOUS
  Administered 2021-10-06 – 2021-10-07 (×2): 3 [IU] via SUBCUTANEOUS
  Administered 2021-10-07: 5 [IU] via SUBCUTANEOUS
  Administered 2021-10-07 (×2): 2 [IU] via SUBCUTANEOUS
  Administered 2021-10-07 (×3): 3 [IU] via SUBCUTANEOUS
  Administered 2021-10-08 (×4): 2 [IU] via SUBCUTANEOUS
  Administered 2021-10-08: 3 [IU] via SUBCUTANEOUS
  Administered 2021-10-08: 1 [IU] via SUBCUTANEOUS
  Administered 2021-10-09: 3 [IU] via SUBCUTANEOUS

## 2021-10-05 MED ORDER — SODIUM CHLORIDE 0.9 % IV SOLN: 2.0000 g | INTRAVENOUS | Status: DC

## 2021-10-05 MED ORDER — DEXTROSE 50 % IV SOLN
INTRAVENOUS | Status: AC
  Administered 2021-10-05: 50 mL
  Filled 2021-10-05: qty 50

## 2021-10-05 MED ORDER — ACETAMINOPHEN 325 MG PO TABS
650.0000 mg | ORAL_TABLET | Freq: Once | ORAL | Status: AC
  Administered 2021-10-05: 650 mg via ORAL
  Filled 2021-10-05: qty 2

## 2021-10-05 MED ORDER — ALBUMIN HUMAN 25 % IV SOLN
25.0000 g | Freq: Four times a day (QID) | INTRAVENOUS | Status: AC
  Administered 2021-10-05 – 2021-10-06 (×4): 25 g via INTRAVENOUS
  Filled 2021-10-05 (×4): qty 100

## 2021-10-05 MED ORDER — ONDANSETRON HCL 4 MG PO TABS: 4.0000 mg | ORAL_TABLET | Freq: Four times a day (QID) | ORAL | Status: DC | PRN

## 2021-10-05 MED ORDER — CHLORHEXIDINE GLUCONATE CLOTH 2 % EX PADS
6.0000 | MEDICATED_PAD | Freq: Every day | CUTANEOUS | Status: DC
  Administered 2021-10-05 – 2021-10-27 (×23): 6 via TOPICAL

## 2021-10-05 MED ORDER — MAGNESIUM SULFATE 2 GM/50ML IV SOLN
2.0000 g | Freq: Once | INTRAVENOUS | Status: AC
  Administered 2021-10-06: 2 g via INTRAVENOUS
  Filled 2021-10-05: qty 50

## 2021-10-05 MED ORDER — LACTATED RINGERS IV SOLN: INTRAVENOUS | Status: DC

## 2021-10-05 MED ORDER — TRAMADOL HCL 50 MG PO TABS
50.0000 mg | ORAL_TABLET | Freq: Four times a day (QID) | ORAL | Status: DC | PRN
  Administered 2021-10-05 – 2021-10-06 (×2): 50 mg via ORAL
  Filled 2021-10-05 (×2): qty 1

## 2021-10-05 MED ORDER — SODIUM CHLORIDE 0.9 % IV BOLUS
1000.0000 mL | Freq: Once | INTRAVENOUS | Status: AC
  Administered 2021-10-05: 1000 mL via INTRAVENOUS

## 2021-10-05 MED ORDER — NOREPINEPHRINE 4 MG/250ML-% IV SOLN
2.0000 ug/min | INTRAVENOUS | Status: DC
  Filled 2021-10-05: qty 250

## 2021-10-05 MED ORDER — SODIUM CHLORIDE 0.9% IV SOLUTION: Freq: Once | INTRAVENOUS | Status: DC

## 2021-10-05 MED ORDER — ACETAMINOPHEN 325 MG PO TABS
650.0000 mg | ORAL_TABLET | Freq: Four times a day (QID) | ORAL | Status: DC | PRN
  Administered 2021-10-05 – 2021-10-27 (×23): 650 mg via ORAL
  Filled 2021-10-05 (×25): qty 2

## 2021-10-05 MED ORDER — ALBUMIN HUMAN 25 % IV SOLN
25.0000 g | Freq: Once | INTRAVENOUS | Status: AC
Start: 2021-10-05 — End: 2021-10-05
  Administered 2021-10-05: 25 g via INTRAVENOUS
  Filled 2021-10-05: qty 100

## 2021-10-05 MED ORDER — SODIUM CHLORIDE 0.9 % IV SOLN
2.0000 g | INTRAVENOUS | Status: DC
  Administered 2021-10-05 – 2021-10-06 (×2): 2 g via INTRAVENOUS
  Filled 2021-10-05 (×2): qty 20

## 2021-10-05 MED ORDER — PANTOPRAZOLE SODIUM 40 MG PO TBEC
40.0000 mg | DELAYED_RELEASE_TABLET | Freq: Every day | ORAL | Status: DC
  Administered 2021-10-05 – 2021-10-28 (×24): 40 mg via ORAL
  Filled 2021-10-05 (×24): qty 1

## 2021-10-05 MED ORDER — DEXTROSE 50 % IV SOLN
12.5000 g | Freq: Once | INTRAVENOUS | Status: AC
Start: 2021-10-05 — End: 2021-10-05

## 2021-10-05 MED ORDER — ACETAMINOPHEN 650 MG RE SUPP: 650.0000 mg | Freq: Four times a day (QID) | RECTAL | Status: DC | PRN

## 2021-10-05 NOTE — ED Notes (Signed)
Admitting MD at bedside and aware of pt's BP. Pt is getting her 3rd bolus from [place], 4th in total including bolus given with EMS. Pt was started on a LR continuous infusion. Per MD they will order albumin. This RN will continue to monitor.

## 2021-10-05 NOTE — Progress Notes (Signed)
eLink Physician-Brief Progress Note Patient Name: [name] DOB: [birth year] MRN: [id number]   Date of Service  10/05/2021  HPI/Events of Note  Notified of hypoglycemic episodes, hypokalemia with K at 3.0 and thrombocytopenia.   Pt admitted with pyelonephritis.  She received levemir 15 units earlier today.  She had glucose in the 30s earlier and now in the 60s for which she was given D50.   Crea improving from 2.75 --> 2.44.  Platelets remains at 21 after transfusion of 1 unit platelets.   eICU Interventions  Hold levemir.  Start on D5LR @ 50cc/hr.  Replete K - 67mq x 4 ordered.  No plans of surgical intervention at this time with no signs of bleeding.  Continue to monitor platelet count. Hold off on further transfusion.       Intervention Category Intermediate Interventions: Other:;Electrolyte abnormality - evaluation and management  Patricia Lincoln6/18/2023, 7:56 PM

## 2021-10-05 NOTE — Progress Notes (Signed)
Pharmacy Antibiotic Note  [name] is a 56 y.o. female admitted on 10/04/2021 with  pyelonephritis .  Pharmacy has been consulted for Merrem dosing. Noted renal dysfunction.   Plan: Merrem 1g IV q12h Trend WBC, temp, renal function F/U infectious work-up     Temp (24hrs), Avg:97.7 F (36.5 C), Min:97.7 F (36.5 C), Max:97.7 F (36.5 C)  Recent Labs  Lab 10/04/21 2241 10/04/21 2345 10/05/21 0253  WBC  --  12.1*  --   CREATININE 3.07*  --   --   LATICACIDVEN  --   --  6.0*    CrCl cannot be calculated (Unknown ideal weight.).    Allergies  Allergen Reactions   Aspartame And Phenylalanine Shortness Of Breath and Swelling    Throat swells  ANY ARTIFICAL SWEETNERS   Toradol [Ketorolac Tromethamine] Other (See Comments)    Face & neck flushed red & pt felt very hot after IV adm.   Sucralose Rash    Pt.s face broke out in rash after drinking Breeza.  Pt. Claims it is from the artificial sweetener.      Dilaudid [Hydromorphone Hcl]     Itch   Hydrocodone Itching   Oxycodone Rash    itching    Narda Bonds, PharmD, BCPS Clinical Pharmacist Phone: [phone]

## 2021-10-05 NOTE — Progress Notes (Addendum)
eLink Physician-Brief Progress Note Patient Name: [name] DOB: [birth year] MRN: [id number]   Date of Service  10/05/2021  HPI/Events of Note  Notified of hypotension with BP 80/49.  Pt is awake and alert. Last serum HCO3 at 17.  HCO3 gtt has been discontinued.   eICU Interventions  Give 500cc LR bolus. If MAP remains less than 65, then start on levophed. Check venous PH.          [name] 10/05/2021, 9:30 PM  11:23 PM Venous pH 7.45. Mg 1.3, K 3.4, crea at 2.41.  Plan> No indication for HCO3 gtt at this time.  Continue K repletion as ordered earlier.  Give MgSo4.  Recheck BMP, Mg, phos in the AM.

## 2021-10-05 NOTE — H&P (Addendum)
History and Physical    [name] NWG:[id number] DOB: [birth year] DOA: 10/04/2021  PCP: [facility]   Patient coming from: Home   Chief Complaint: N/V/D, loss of appetite, pain all over, chest pain, SOB   HPI: [name] is a pleasant 56 y.o. female with medical history significant for CAD with inferior STEMI in 2015, insulin-dependent diabetes mellitus, hypertension, and NASH, now presenting to the emergency department with chest pain, generalized aches, loss of appetite, and nausea, vomiting, and diarrhea.  Patient reports that she was having nausea, vomiting, and loose stools for almost a week until yesterday, vomiting anytime she attempted to eat or drink anything, and having 2-3 loose stools daily.  She has been unable to keep anything down over this interval, and while she was not having any vomiting or diarrhea yesterday, she did not have an appetite and did not attempt to eat or drink.  She developed sharp intermittent pains localized to the left chest yesterday morning that she has continued to experience throughout the day.  This is different than the symptoms she had with her MI in 2015.  She has pain all over, including the bilateral flanks, generalized abdominal soreness, but denies dysuria or hematuria, but notes that she has been urinating very little lately.  Patient called EMS, was found to be hypotensive, and given a liter of IV fluids prior to arrival in the ED.  ED Course: Upon arrival to the ED, patient is found to be afebrile and saturating well on room air with normal heart rate and systolic blood pressures ranging from mid 70s to 100.  Labs in the ED were most notable for creatinine 3.07, serum bicarbonate 12, anion gap 20, albumin 2.0, platelets 27,000, and troponin elevated 225, and then decreased to 60.  CT of the abdomen and pelvis is notable for subcapsular and parenchymal gas collections in the right kidney, no hydronephrosis, and  cirrhotic liver changes with upper abdominal varices.  Blood cultures ordered, urine culture collected, and the patient was given 3 L of saline and 1 g of Rocephin.  Lactic acid was ordered but not yet resulted.  Review of Systems:  All other systems reviewed and apart from HPI, are negative.  Past Medical History:  Diagnosis Date   Anxiety    CAD (coronary artery disease)    a.  Inferior STEMI / CAD s/p DES to prox RCA 03/03/14, staged cath 03/05/14 for LAD showing significantly improved stenosis of mid LAD with resolution of prior thrombus.   Clostridium difficile colitis    Diabetes mellitus    Dyslipidemia    Fibromyalgia    GERD (gastroesophageal reflux disease)    H/O hiatal hernia    Ischemic cardiomyopathy    a. EF 45-50% by echo 03/04/14 with suspected mild acute diastolic CHF s/p dose of IV Lasix.   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   Sinus bradycardia    a. During 02/2014 admit, not on BB due to this.   Upper respiratory infection 2/15   states has dry non productive cough since then- "tickle in my throat" no fever    Past Surgical History:  Procedure Laterality Date   ABDOMINAL HYSTERECTOMY     partial   CARDIAC CATHETERIZATION  03/05/14   improved LAD   COLONOSCOPY  12/07/2011   Rourk-normal rectum,, colon and  terminal ileum   CORONARY ANGIOPLASTY WITH STENT PLACEMENT  03/03/14   DES to RCA,  ulcerated plaque in LAD   ESOPHAGOGASTRODUODENOSCOPY  08/27/2011   Rourk-reactive gastropathy, normal, patent esophagus. Stomach empty. Multiple linear antral erosions. No ulcer or infiltrating process. Small hiatal hernia. Patent pylorus. Normal first and second portion of the duodenum   LEFT HEART CATH Bilateral 03/03/2014   Procedure: LEFT HEART CATH;  Surgeon: Peter M Martinique, MD;  Location: [facility] CATH LAB;  Service: Cardiovascular;  Laterality: Bilateral;    PERCUTANEOUS CORONARY STENT INTERVENTION (PCI-S) N/A 03/05/2014   Procedure: PERCUTANEOUS CORONARY STENT INTERVENTION (PCI-S);  Surgeon: Troy Sine, MD;  Location: [facility] CATH LAB;  Service: Cardiovascular;  Laterality: N/A;   SHOULDER ARTHROSCOPY WITH OPEN ROTATOR CUFF REPAIR Left 03/02/2013   Procedure: LEFT SHOULDER ARTHROSCOPY, SUBACROMIAL DECOMPRESSION, MANIPULATION UNDER ANESTHESIA;  Surgeon: [name], MD;  Location: [facility] ORS;  Service: Orthopedics;  Laterality: Left;   SHOULDER OPEN ROTATOR CUFF REPAIR Left 07/27/2013   Procedure: LEFT MINI OPEN ROTATOR CUFF REPAIR ;  Surgeon: [name], MD;  Location: [facility] ORS;  Service: Orthopedics;  Laterality: Left;   SPLENECTOMY     TONSILLECTOMY      Social History:   reports that she quit smoking about 15 years ago. Her smoking use included cigarettes. She has a 7.50 pack-year smoking history. She has never used smokeless tobacco. She reports current alcohol use. She reports that she does not use drugs.  Allergies  Allergen Reactions   Aspartame And Phenylalanine Shortness Of Breath and Swelling    Throat swells  ANY ARTIFICAL SWEETNERS   Toradol [Ketorolac Tromethamine] Other (See Comments)    Face & neck flushed red & pt felt very hot after IV adm.   Sucralose Rash    Pt.s face broke out in rash after drinking Breeza.  Pt. Claims it is from the artificial sweetener.      Dilaudid [Hydromorphone Hcl]     Itch   Hydrocodone Itching   Oxycodone Rash    itching    Family History  Problem Relation Age of Onset   Arthritis Mother    Diabetes Father    Kidney disease Father    Colon cancer Neg Hx      Prior to Admission medications   Medication Sig Start Date End Date Taking? Authorizing Provider  alprazolam Duanne Moron) 2 MG tablet Take 2 mg by mouth 2 (two) times daily as needed for sleep or anxiety.    [provider]  amitriptyline (ELAVIL) 50 MG tablet Take 2 tablets at bedtime Patient taking differently: Take 10  mg by mouth at bedtime.  06/20/14   Cameron Sprang, MD  aspirin EC 81 MG tablet Take 1 tablet (81 mg total) by mouth daily. 03/06/14   Dunn, Nedra Hai, PA-C  benzonatate (TESSALON) 200 MG capsule Take 1 capsule (200 mg total) by mouth 3 (three) times daily as needed for cough. 09/19/14   Juanito Doom, MD  dexlansoprazole (DEXILANT) 60 MG capsule Take 60 mg by mouth daily.    [provider]  insulin aspart (NOVOLOG) 100 UNIT/ML injection Inject 30 Units into the skin 2 (two) times daily. 10 units twice a day    [provider]  Insulin Glargine (LANTUS SOLOSTAR) 100 UNIT/ML Solostar Pen Inject 50 Units into the skin 2 (two) times daily.    [provider]  nebivolol (BYSTOLIC) 10 MG tablet Take 1 tablet (10 mg total) by mouth daily. 09/13/14  Martinique, Peter M, MD  nitroGLYCERIN (NITROSTAT) 0.4 MG SL tablet Place 1 tablet (0.4 mg total) under the tongue every 5 (five) minutes as needed for chest pain. 03/28/14   Isaiah Serge, NP  prasugrel (EFFIENT) 10 MG TABS tablet Take 1 tablet (10 mg total) by mouth daily. 03/07/14   Dunn, Nedra Hai, PA-C  pravastatin (PRAVACHOL) 80 MG tablet Take 1 tablet (80 mg total) by mouth daily. 03/06/14   Dunn, Nedra Hai, PA-C  pregabalin (LYRICA) 300 MG capsule Take 1 capsule (300 mg total) by mouth 2 (two) times daily. 08/21/13   [place], Modena Nunnery, MD  sitaGLIPtin-metformin (JANUMET) 50-1000 MG per tablet Take 1 tablet by mouth 2 (two) times daily with a meal.    [provider]  traMADol (ULTRAM) 50 MG tablet Take 1 tablet (50 mg total) by mouth every 6 (six) hours as needed. 11/05/16   Margette Fast, MD    Physical Exam: Vitals:   10/05/21 0300 10/05/21 0315 10/05/21 0330 10/05/21 0345  BP: (!) 85/53 (!) 89/53 (!) 82/52 (!) 75/55  Pulse: 86  85 84  Resp: '20 18 17 16  '$ Temp:      TempSrc:      SpO2: 99%  99% 97%    Constitutional: NAD, calm  Eyes: PERTLA, lids and conjunctivae normal ENMT: Mucous membranes are dry. Posterior  pharynx clear of any exudate or lesions.   Neck: supple, no masses  Respiratory: no wheezing, no crackles. No accessory muscle use.  Cardiovascular: S1 & S2 heard, regular rate and rhythm. No extremity edema.   Abdomen: generalized tenderness, no rebound pain or guarding. Bowel sounds active.  Musculoskeletal: no clubbing / cyanosis. No joint deformity upper and lower extremities.   Skin: no significant rashes, lesions, ulcers. Warm, dry, well-perfused. Neurologic: CN 2-12 grossly intact. Moving all extremities. Alert and oriented.  Psychiatric: Pleasant. Cooperative.    Labs and Imaging on Admission: I have personally reviewed following labs and imaging studies  CBC: Recent Labs  Lab 10/04/21 2345  WBC 12.1*  HGB 13.0  HCT 38.9  MCV 98.2  PLT 27*   Basic Metabolic Panel: Recent Labs  Lab 10/04/21 2241  NA 131*  K 4.0  CL 99  CO2 12*  GLUCOSE 311*  BUN 85*  CREATININE 3.07*  CALCIUM 7.7*   GFR: CrCl cannot be calculated (Unknown ideal weight.). Liver Function Tests: Recent Labs  Lab 10/04/21 2345  AST 18  ALT 20  ALKPHOS 136*  BILITOT 0.6  PROT 6.3*  ALBUMIN 2.0*   Recent Labs  Lab 10/04/21 2345  LIPASE 18   No results for input(s): "AMMONIA" in the last 168 hours. Coagulation Profile: Recent Labs  Lab 10/04/21 2241  INR 1.2   Cardiac Enzymes: Recent Labs  Lab 10/04/21 2345  CKTOTAL 22*   BNP (last 3 results) No results for input(s): "PROBNP" in the last 8760 hours. HbA1C: No results for input(s): "HGBA1C" in the last 72 hours. CBG: No results for input(s): "GLUCAP" in the last 168 hours. Lipid Profile: No results for input(s): "CHOL", "HDL", "LDLCALC", "TRIG", "CHOLHDL", "LDLDIRECT" in the last 72 hours. Thyroid Function Tests: No results for input(s): "TSH", "T4TOTAL", "FREET4", "T3FREE", "THYROIDAB" in the last 72 hours. Anemia Panel: No results for input(s): "VITAMINB12", "FOLATE", "FERRITIN", "TIBC", "IRON", "RETICCTPCT" in the last  72 hours. Urine analysis:    Component Value Date/Time   COLORURINE YELLOW 10/05/2021 0216   APPEARANCEUR TURBID (A) 10/05/2021 0216   LABSPEC 1.015 10/05/2021 0216  PHURINE 5.0 10/05/2021 0216   GLUCOSEU 50 (A) 10/05/2021 0216   HGBUR LARGE (A) 10/05/2021 0216   BILIRUBINUR NEGATIVE 10/05/2021 0216   KETONESUR NEGATIVE 10/05/2021 0216   PROTEINUR 100 (A) 10/05/2021 0216   UROBILINOGEN 0.2 01/19/2012 2019   NITRITE NEGATIVE 10/05/2021 0216   LEUKOCYTESUR LARGE (A) 10/05/2021 0216   Sepsis Labs: '@LABRCNTIP'$ (procalcitonin:4,lacticidven:4) ) Recent Results (from the past 240 hour(s))  SARS Coronavirus 2 by RT PCR (hospital order, performed in [place] hospital lab) *cepheid single result test* Anterior Nasal Swab     Status: None   Collection Time: 10/05/21  2:53 AM   Specimen: Anterior Nasal Swab  Result Value Ref Range Status   SARS Coronavirus 2 by RT PCR NEGATIVE NEGATIVE Final    Comment: (NOTE) SARS-CoV-2 target nucleic acids are NOT DETECTED.  The SARS-CoV-2 RNA is generally detectable in upper and lower respiratory specimens during the acute phase of infection. The lowest concentration of SARS-CoV-2 viral copies this assay can detect is 250 copies / mL. A negative result does not preclude SARS-CoV-2 infection and should not be used as the sole basis for treatment or other patient management decisions.  A negative result may occur with improper specimen collection / handling, submission of specimen other than nasopharyngeal swab, presence of viral mutation(s) within the areas targeted by this assay, and inadequate number of viral copies (<250 copies / mL). A negative result must be combined with clinical observations, patient history, and epidemiological information.  Fact Sheet for Patients:   https://www.patel.info/  Fact Sheet for Healthcare Providers: https://hall.com/  This test is not yet approved or  cleared by the  [place] FDA and has been authorized for detection and/or diagnosis of SARS-CoV-2 by FDA under an Emergency Use Authorization (EUA).  This EUA will remain in effect (meaning this test can be used) for the duration of the COVID-19 declaration under Section 564(b)(1) of the Act, 21 U.S.C. section 360bbb-3(b)(1), unless the authorization is terminated or revoked sooner.  Performed at [facility], [place] [address], [place], [place] [address]      Radiological Exams on Admission: CT ABDOMEN PELVIS WO CONTRAST  Result Date: 10/05/2021 CLINICAL DATA:  Nausea and vomiting.  Hypotensive. EXAM: CT ABDOMEN AND PELVIS WITHOUT CONTRAST TECHNIQUE: Multidetector CT imaging of the abdomen and pelvis was performed following the standard protocol without IV contrast. RADIATION DOSE REDUCTION: This exam was performed according to the departmental dose-optimization program which includes automated exposure control, adjustment of the mA and/or kV according to patient size and/or use of iterative reconstruction technique. COMPARISON:  01/19/2012 FINDINGS: Lower chest: Atelectasis in the lung bases. Small esophageal hiatal hernia. Hepatobiliary: Hepatic changes suggest cirrhosis with enlarged lateral segment left and caudate lobes. Serpiginous portal and upper abdominal vessel suggesting varices. Gallbladder and bile ducts are unremarkable. Pancreas: Unremarkable. No pancreatic ductal dilatation or surrounding inflammatory changes. Spleen: Surgical absence of the spleen. Adrenals/Urinary Tract: No adrenal gland nodules. Sub capsular and soft tissue gas collections demonstrated within the right kidney, likely indicating infection with gas-forming organism. The estrogenic or traumatic causes could also have this appearance. Correlate with history of any biopsy or other procedure. No hydronephrosis or hydroureter. Bladder is normal. Stomach/Bowel: Stomach is within normal limits. Appendix appears normal. No evidence  of bowel wall thickening, distention, or inflammatory changes. Vascular/Lymphatic: Calcification of the aorta. No aneurysm. Prominent lymph nodes demonstrated in the celiac axis, mesenteric, and retroperitoneum. Largest measure about 1.3 cm short axis dimension. Similar appearance to previous study, likely indicating  reactive or cirrhotic related nodes. Reproductive: Status post hysterectomy. No adnexal masses. Other: No free air or free fluid in the abdomen. Inflammatory stranding is demonstrated along the right pericolic gutters. Musculoskeletal: No acute or significant osseous findings. IMPRESSION: 1. Subcapsular and parenchymal gas collections within the right kidney. Unless there is been recent biopsy or other procedure, this is likely to represent infection with gas-forming organism. Inflammatory stranding suggested along the right pericolic gutter. 2. 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 3. Aortic atherosclerosis. Electronically Signed   By: Lucienne Capers M.D.   On: 10/05/2021 01:35   DG Chest 2 View  Result Date: 10/04/2021 CLINICAL DATA:  Chest pain, vomiting, weakness EXAM: CHEST - 2 VIEW COMPARISON:  09/19/2014 FINDINGS: Normal heart size and pulmonary vascularity. No focal airspace disease or consolidation in the lungs. No blunting of costophrenic angles. No pneumothorax. Mediastinal contours appear intact. Coronary stents are demonstrated. Calcification of the aorta. IMPRESSION: No active cardiopulmonary disease. Electronically Signed   By: Lucienne Capers M.D.   On: 10/04/2021 23:14    EKG: Independently reviewed. Sinus rhythm, RSR' in V1.   Assessment/Plan  1. Emphysematous pyelonephritis  - Presents with atypical chest pain and general aches after a week of N/V/D and has emphysematous pyelonephritis on CT  - Urine culture was collected in ED, blood cultures ordered, and she was started on Rocephin  - Continue empiric antibiotics, follow  cultures and clinical course, discus with urology in am    ADDENDUM: initial lactate has resulted at 6, drawn after she had received >3 liters IVF, and SBP remains in 80s after >4 liters IVF (she had 1 liter with EMS). PCCM was consulted due to concern for septic shock; appreciate their help; they recommend urology consultation now as well. Consulted with Dr. Gloriann Loan of urology, appreciate his help.    2. Acute renal failure; acidosis  - BUN is 85 and SCr 3.07 in ED with bicarbonate of 12 and AG 20  - No hydronephrosis on CT  - Likely acute prerenal azotemia in setting of hypovolemia and hypotension; ATN possible   - Renally-dose medications, continue IVF hydration with isotonic bicarbonate, check urine sodium and creatinine, repeat chem panel in am   3. Hypotension  - SBP 70s to 100 in ED - She is severely hypovolemic after a week of N/V/D; developing sepsis also a concern though she only has 1 SIRS criteria and qSOFA score of 1  - She received a liter of IVF with EMS and 3 in ED with MAP still low-mid 60s  - Hold antihypertensives, follow-up lactic acid level, continue IVF hydration, continue antibiotics    4. Chest pain; CAD; elevated troponin   - Hx of inferior STEMI in 2015 with DES to RCA  - Complains of occasional fleeting atypical chest pains since am of 6/17 and found to have trop of 125, then 60  - Low suspicion for ACS, troponin elevation likely related to sepsis/hypotension with supply-demand mismatch    5. Cirrhosis  - CT findings in ED suspicious for cirrhosis with upper abdominal varices  - She denies hx of liver disease or hx of excessive alcohol use; NASH noted on problem list  - MELD likely not useful now in setting of ARF and pseudohyponatremia; t bili is normal and INR is 1.2    6. N/V/D  - She had N/V/D for close to a week but none 6/17 - Continue IVF resuscitation   7. Insulin-dependent DM  - Serum  glucose 311 in ED; no recent A1c  - Start with low-intensity SSI in  light of renal failure    8. Thrombocytopenia  - Platelets 27,000 on admission without bleeding  - Possibly from infection, she is asplenic  - Treat sepsis, trend blood counts    DVT prophylaxis: SCDs  Code Status: Full   Level of Care: Level of care: Progressive Family Communication: None present  Disposition Plan:  Patient is from: home  Anticipated d/c is to: TBD Anticipated d/c date is: 10/09/21  Patient currently: Pending treatment of severe infection, improved renal function, stable blood counts, may need urology and/or ID involvement  Consults called: none  Admission status: Inpatient     Vianne Bulls, MD [facility]  10/05/2021, 3:58 AM

## 2021-10-05 NOTE — Progress Notes (Signed)
An USGPIV (ultrasound guided PIV) has been placed for short-term vasopressor infusion. A correctly placed ivWatch must be used when administering Vasopressors. Should this treatment be needed beyond 72 hours, central line access should be obtained.  It will be the responsibility of the bedside nurse to follow best practice to prevent extravasations.   ?

## 2021-10-05 NOTE — Consult Note (Signed)
H&P Physician requesting consult: Baltazar Apo  Chief Complaint: Right emphysematous pyelonephritis  History of Present Illness: 56 year old female with a history of metabolic syndrome with diabetes, CAD, ischemic cardiomyopathy presenting with a 2-day history of diffuse abdominal pain, nausea, vomiting.  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  No evidence of obstruction.  No stones.  Has some hypotension.  Normal heart rate and has been afebrile.  Mentating well without any confusion.  Presented with a lactate of 6 which has normalized.  Also a creatinine of 3.07 that is now 2.75.  Foley catheter inserted per my request.   Upon my assessment, the patient's blood pressure had improved with fluid resuscitation.  She was not currently requiring any pressors.  Other than abdominal pain, she denies any significant plaints.  Denies any voiding complaints but she has had some decreased urine output but also has not had much oral intake.  Past Medical History:  Diagnosis Date   Anxiety    CAD (coronary artery disease)    a.  Inferior STEMI / CAD s/p DES to prox RCA 03/03/14, staged cath 03/05/14 for LAD showing significantly improved stenosis of mid LAD with resolution of prior thrombus.   Clostridium difficile colitis    Diabetes mellitus    Dyslipidemia    Fibromyalgia    GERD (gastroesophageal reflux disease)    H/O hiatal hernia    Ischemic cardiomyopathy    a. EF 45-50% by echo 03/04/14 with suspected mild acute diastolic CHF s/p dose of IV Lasix.   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   Sinus bradycardia    a. During 02/2014 admit, not on BB due to this.   Upper respiratory infection 2/15   states has dry non productive cough since then- "tickle in my throat" no fever   Past Surgical History:   Procedure Laterality Date   ABDOMINAL HYSTERECTOMY     partial   CARDIAC CATHETERIZATION  03/05/14   improved LAD   COLONOSCOPY  12/07/2011   Rourk-normal rectum,, colon and terminal ileum   CORONARY ANGIOPLASTY WITH STENT PLACEMENT  03/03/14   DES to RCA,  ulcerated plaque in LAD   ESOPHAGOGASTRODUODENOSCOPY  08/27/2011   Rourk-reactive gastropathy, normal, patent esophagus. Stomach empty. Multiple linear antral erosions. No ulcer or infiltrating process. Small hiatal hernia. Patent pylorus. Normal first and second portion of the duodenum   LEFT HEART CATH Bilateral 03/03/2014   Procedure: LEFT HEART CATH;  Surgeon: Peter M Martinique, MD;  Location: [facility] CATH LAB;  Service: Cardiovascular;  Laterality: Bilateral;   PERCUTANEOUS CORONARY STENT INTERVENTION (PCI-S) N/A 03/05/2014   Procedure: PERCUTANEOUS CORONARY STENT INTERVENTION (PCI-S);  Surgeon: Troy Sine, MD;  Location: [facility] CATH LAB;  Service: Cardiovascular;  Laterality: N/A;   SHOULDER ARTHROSCOPY WITH OPEN ROTATOR CUFF REPAIR Left 03/02/2013   Procedure: LEFT SHOULDER ARTHROSCOPY, SUBACROMIAL DECOMPRESSION, MANIPULATION UNDER ANESTHESIA;  Surgeon: [name], MD;  Location: [facility] ORS;  Service: Orthopedics;  Laterality: Left;   SHOULDER OPEN ROTATOR CUFF REPAIR Left 07/27/2013   Procedure: LEFT MINI OPEN ROTATOR CUFF REPAIR ;  Surgeon: [name], MD;  Location: [facility] ORS;  Service: Orthopedics;  Laterality: Left;   SPLENECTOMY     TONSILLECTOMY      Home Medications:  Medications Prior to Admission  Medication Sig Dispense Refill Last  Dose   alprazolam (XANAX) 2 MG tablet Take 2 mg by mouth 2 (two) times daily as needed for sleep or anxiety.      aspirin EC 81 MG tablet Take 1 tablet (81 mg total) by mouth daily. 30 tablet 11    dexlansoprazole (DEXILANT) 60 MG capsule Take 60 mg by mouth daily.      traMADol (ULTRAM) 50 MG tablet Take 1 tablet (50 mg total) by mouth every 6 (six) hours as needed. 15 tablet 0    Allergies:   Allergies  Allergen Reactions   Aspartame And Phenylalanine Shortness Of Breath and Swelling    Throat swells  ANY ARTIFICAL SWEETNERS   Toradol [Ketorolac Tromethamine] Other (See Comments)    Face & neck flushed red & pt felt very hot after IV adm.   Sucralose Rash    Pt.s face broke out in rash after drinking Breeza.  Pt. Claims it is from the artificial sweetener.      Dilaudid [Hydromorphone Hcl]     Itch   Hydrocodone Itching   Oxycodone Rash    itching    Family History  Problem Relation Age of Onset   Arthritis Mother    Diabetes Father    Kidney disease Father    Colon cancer Neg Hx    Social History:  reports that she quit smoking about 15 years ago. Her smoking use included cigarettes. She has a 7.50 pack-year smoking history. She has never used smokeless tobacco. She reports current alcohol use. She reports that she does not use drugs.  ROS: A complete review of systems was performed.  All systems are negative except for pertinent findings as noted. ROS   Physical Exam:  Vital signs in last 24 hours: Temp:  [97.6 F (36.4 C)-98.6 F (37 C)] 98.6 F (37 C) (06/18 0700) Pulse Rate:  [82-95] 95 (06/18 0800) Resp:  [16-22] 17 (06/18 0800) BP: (75-101)/(51-83) 101/69 (06/18 0800) SpO2:  [94 %-100 %] 98 % (06/18 0800) General:  Alert and oriented, No acute distress HEENT: Normocephalic, atraumatic Neck: No JVD or lymphadenopathy Cardiovascular: Regular rate and rhythm Lungs: Regular rate and effort Abdomen: Soft, mildly tender to palpation, nondistended, no abdominal masses Back: No CVA tenderness Extremities: No edema Neurologic: Grossly intact  Laboratory Data:  Results for orders placed or performed during the hospital encounter of 10/04/21 (from the past 24 hour(s))  Basic metabolic panel     Status: Abnormal   Collection Time: 10/04/21 10:41 PM  Result Value Ref Range   Sodium 131 (L) 135 - 145 mmol/L   Potassium 4.0 3.5 - 5.1 mmol/L   Chloride 99  98 - 111 mmol/L   CO2 12 (L) 22 - 32 mmol/L   Glucose, Bld 311 (H) 70 - 99 mg/dL   BUN 85 (H) 6 - 20 mg/dL   Creatinine, Ser 3.07 (H) 0.44 - 1.00 mg/dL   Calcium 7.7 (L) 8.9 - 10.3 mg/dL   GFR, Estimated 17 (L) >60 mL/min   Anion gap 20 (H) 5 - 15  Troponin I (High Sensitivity)     Status: Abnormal   Collection Time: 10/04/21 10:41 PM  Result Value Ref Range   Troponin I (High Sensitivity) 125 (HH) <18 ng/L  Protime-INR (order if Patient is taking Coumadin / Warfarin)     Status: None   Collection Time: 10/04/21 10:41 PM  Result Value Ref Range   Prothrombin Time 14.9 11.4 - 15.2 seconds   INR 1.2 0.8 - 1.2  I-Stat  beta hCG blood, ED     Status: Abnormal   Collection Time: 10/04/21 10:46 PM  Result Value Ref Range   I-stat hCG, quantitative 12.8 (H) <5 mIU/mL   Comment 3          CBC     Status: Abnormal   Collection Time: 10/04/21 11:45 PM  Result Value Ref Range   WBC 12.1 (H) 4.0 - 10.5 K/uL   RBC 3.96 3.87 - 5.11 MIL/uL   Hemoglobin 13.0 12.0 - 15.0 g/dL   HCT 38.9 36.0 - 46.0 %   MCV 98.2 80.0 - 100.0 fL   MCH 32.8 26.0 - 34.0 pg   MCHC 33.4 30.0 - 36.0 g/dL   RDW 15.1 11.5 - 15.5 %   Platelets 27 (LL) 150 - 400 K/uL   nRBC 1.0 (H) 0.0 - 0.2 %  Hepatic function panel     Status: Abnormal   Collection Time: 10/04/21 11:45 PM  Result Value Ref Range   Total Protein 6.3 (L) 6.5 - 8.1 g/dL   Albumin 2.0 (L) 3.5 - 5.0 g/dL   AST 18 15 - 41 U/L   ALT 20 0 - 44 U/L   Alkaline Phosphatase 136 (H) 38 - 126 U/L   Total Bilirubin 0.6 0.3 - 1.2 mg/dL   Bilirubin, Direct 0.2 0.0 - 0.2 mg/dL   Indirect Bilirubin 0.4 0.3 - 0.9 mg/dL  Lipase, blood     Status: None   Collection Time: 10/04/21 11:45 PM  Result Value Ref Range   Lipase 18 11 - 51 U/L  CK     Status: Abnormal   Collection Time: 10/04/21 11:45 PM  Result Value Ref Range   Total CK 22 (L) 38 - 234 U/L  Troponin I (High Sensitivity)     Status: Abnormal   Collection Time: 10/05/21 12:30 AM  Result Value Ref  Range   Troponin I (High Sensitivity) 60 (H) <18 ng/L  Urinalysis, Routine w reflex microscopic Urine, Clean Catch     Status: Abnormal   Collection Time: 10/05/21  2:16 AM  Result Value Ref Range   Color, Urine YELLOW YELLOW   APPearance TURBID (A) CLEAR   Specific Gravity, Urine 1.015 1.005 - 1.030   pH 5.0 5.0 - 8.0   Glucose, UA 50 (A) NEGATIVE mg/dL   Hgb urine dipstick LARGE (A) NEGATIVE   Bilirubin Urine NEGATIVE NEGATIVE   Ketones, ur NEGATIVE NEGATIVE mg/dL   Protein, ur 100 (A) NEGATIVE mg/dL   Nitrite NEGATIVE NEGATIVE   Leukocytes,Ua LARGE (A) NEGATIVE   RBC / HPF 11-20 0 - 5 RBC/hpf   WBC, UA >50 (H) 0 - 5 WBC/hpf   Bacteria, UA MANY (A) NONE SEEN   Squamous Epithelial / LPF 21-50 0 - 5   Mucus PRESENT   Lactic acid, plasma     Status: Abnormal   Collection Time: 10/05/21  2:53 AM  Result Value Ref Range   Lactic Acid, Venous 6.0 (HH) 0.5 - 1.9 mmol/L  SARS Coronavirus 2 by RT PCR (hospital order, performed in [facility]) *cepheid single result test* Anterior Nasal Swab     Status: None   Collection Time: 10/05/21  2:53 AM   Specimen: Anterior Nasal Swab  Result Value Ref Range   SARS Coronavirus 2 by RT PCR NEGATIVE NEGATIVE  Prepare platelet pheresis     Status: None (Preliminary result)   Collection Time: 10/05/21  4:49 AM  Result Value Ref Range   Unit Number  [id number]    Blood Component Type PSORALEN TREATED    Unit division 00    Status of Unit ALLOCATED    Transfusion Status OK TO TRANSFUSE   Type and screen [place]     Status: None   Collection Time: 10/05/21  5:00 AM  Result Value Ref Range   ABO/RH(D) O POS    Antibody Screen NEG    Sample Expiration      10/08/2021,2359 Performed at [facility], [place] [address], [place], [place] [address]   Lactic acid, plasma     Status: None   Collection Time: 10/05/21  5:14 AM  Result Value Ref Range   Lactic Acid, Venous 0.7 0.5 - 1.9 mmol/L  Hemoglobin A1c      Status: Abnormal   Collection Time: 10/05/21  5:14 AM  Result Value Ref Range   Hgb A1c MFr Bld 12.5 (H) 4.8 - 5.6 %   Mean Plasma Glucose 312.05 mg/dL  Basic metabolic panel     Status: Abnormal   Collection Time: 10/05/21  5:15 AM  Result Value Ref Range   Sodium 135 135 - 145 mmol/L   Potassium 4.2 3.5 - 5.1 mmol/L   Chloride 105 98 - 111 mmol/L   CO2 10 (L) 22 - 32 mmol/L   Glucose, Bld 199 (H) 70 - 99 mg/dL   BUN 82 (H) 6 - 20 mg/dL   Creatinine, Ser 2.75 (H) 0.44 - 1.00 mg/dL   Calcium 7.3 (L) 8.9 - 10.3 mg/dL   GFR, Estimated 20 (L) >60 mL/min   Anion gap 20 (H) 5 - 15  Hepatic function panel     Status: Abnormal   Collection Time: 10/05/21  5:15 AM  Result Value Ref Range   Total Protein 5.5 (L) 6.5 - 8.1 g/dL   Albumin 1.7 (L) 3.5 - 5.0 g/dL   AST 18 15 - 41 U/L   ALT 14 0 - 44 U/L   Alkaline Phosphatase 129 (H) 38 - 126 U/L   Total Bilirubin 0.7 0.3 - 1.2 mg/dL   Bilirubin, Direct 0.3 (H) 0.0 - 0.2 mg/dL   Indirect Bilirubin 0.4 0.3 - 0.9 mg/dL  Magnesium     Status: Abnormal   Collection Time: 10/05/21  5:15 AM  Result Value Ref Range   Magnesium 1.4 (L) 1.7 - 2.4 mg/dL  Phosphorus     Status: None   Collection Time: 10/05/21  5:15 AM  Result Value Ref Range   Phosphorus 3.9 2.5 - 4.6 mg/dL  CBC     Status: Abnormal   Collection Time: 10/05/21  5:15 AM  Result Value Ref Range   WBC 10.1 4.0 - 10.5 K/uL   RBC 3.63 (L) 3.87 - 5.11 MIL/uL   Hemoglobin 12.0 12.0 - 15.0 g/dL   HCT 36.1 36.0 - 46.0 %   MCV 99.4 80.0 - 100.0 fL   MCH 33.1 26.0 - 34.0 pg   MCHC 33.2 30.0 - 36.0 g/dL   RDW 15.5 11.5 - 15.5 %   Platelets 21 (LL) 150 - 400 K/uL   nRBC 1.5 (H) 0.0 - 0.2 %  CBG monitoring, ED     Status: Abnormal   Collection Time: 10/05/21  5:15 AM  Result Value Ref Range   Glucose-Capillary 204 (H) 70 - 99 mg/dL  MRSA Next Gen by PCR, Nasal     Status: None   Collection Time: 10/05/21  6:01 AM   Specimen: Nasal Mucosa; Nasal Swab  Result Value Ref Range    MRSA by PCR Next Gen NOT DETECTED NOT DETECTED  Glucose, capillary     Status: Abnormal   Collection Time: 10/05/21  7:30 AM  Result Value Ref Range   Glucose-Capillary 160 (H) 70 - 99 mg/dL   Recent Results (from the past 240 hour(s))  SARS Coronavirus 2 by RT PCR (hospital order, performed in [facility] hospital lab) *cepheid single result test* Anterior Nasal Swab     Status: None   Collection Time: 10/05/21  2:53 AM   Specimen: Anterior Nasal Swab  Result Value Ref Range Status   SARS Coronavirus 2 by RT PCR NEGATIVE NEGATIVE Final    Comment: (NOTE) SARS-CoV-2 target nucleic acids are NOT DETECTED.  The SARS-CoV-2 RNA is generally detectable in upper and lower respiratory specimens during the acute phase of infection. The lowest concentration of SARS-CoV-2 viral copies this assay can detect is 250 copies / mL. A negative result does not preclude SARS-CoV-2 infection and should not be used as the sole basis for treatment or other patient management decisions.  A negative result may occur with improper specimen collection / handling, submission of specimen other than nasopharyngeal swab, presence of viral mutation(s) within the areas targeted by this assay, and inadequate number of viral copies (<250 copies / mL). A negative result must be combined with clinical observations, patient history, and epidemiological information.  Fact Sheet for Patients:   https://www.patel.info/  Fact Sheet for Healthcare Providers: https://hall.com/  This test is not yet approved or  cleared by the [place] FDA and has been authorized for detection and/or diagnosis of SARS-CoV-2 by FDA under an Emergency Use Authorization (EUA).  This EUA will remain in effect (meaning this test can be used) for the duration of the COVID-19 declaration under Section 564(b)(1) of the Act, 21 U.S.C. section 360bbb-3(b)(1), unless the authorization is terminated  or revoked sooner.  Performed at [facility], [place] [address], [place], [place] [address]   MRSA Next Gen by PCR, Nasal     Status: None   Collection Time: 10/05/21  6:01 AM   Specimen: Nasal Mucosa; Nasal Swab  Result Value Ref Range Status   MRSA by PCR Next Gen NOT DETECTED NOT DETECTED Final    Comment: (NOTE) The GeneXpert MRSA Assay (FDA approved for NASAL specimens only), is one component of a comprehensive MRSA colonization surveillance program. It is not intended to diagnose MRSA infection nor to guide or monitor treatment for MRSA infections. Test performance is not FDA approved in patients less than 35 years old. Performed at [facility], [place] [address] [place], [place] [address]    Creatinine: Recent Labs    10/04/21 2241 10/05/21 0515  CREATININE 3.07* 2.75*   CT scan personally reviewed.  Detailed in the history of present illness.  Impression/Assessment:  Right emphysematous pyelonephritis  Plan:  Agree with aggressive medical management with fluid resuscitation, IV antibiotics and follow-up blood/urine cultures with sensitivities, tight diabetes control as you are doing.   Historically, patients were treated with emergent nephrectomy which carries a high mortality risk.  More recent literature reviews suggest medical management carries a lower mortality risk compared to emergent nephrectomy.  I discussed this with the patient and also gave her a copy of the literature listed below:   Dorina Hoyer, D. A systematic review and meta-analysis of risk factors and treatment choices in emphysematous pyelonephritis. Int Urol Nephrol 54, [id number] (2022). https://www.moore.com/   I spoke  with interventional radiology.  There is no discrete fluid collection to place a drain.  Majority of air within her kidney is located anteriorly and medially, very difficult to access and would be dangerous especially with her platelet level.  Also,  given the lack of obstruction/hydronephrosis, recommended against nephrostomy tube or ureteral stent at this time.  We will continue bladder decompression with Foley catheter.  I did specifically offered the patient upfront nephrectomy versus medical management.  After full informed discussion, she would like to proceed with medical management with IV antibiotics.  We will plan for repeat CT imaging tomorrow night or Tuesday morning to evaluate for any interval change, earlier if there is clinical change.  I had a frank conversation about the mortality rate of emphysematous pyelonephritis both with surgery and without surgery.  Discussed the seriousness of the infection.  For now, she is stable we will see how she does with medical management.  She is in agreement with the plan.  Also discussed plan with Dr. Lamonte Sakai who is also in agreement.  Marton Redwood, III 10/05/2021, 9:37 AM

## 2021-10-05 NOTE — Progress Notes (Signed)
PHARMACY - PHYSICIAN COMMUNICATION CRITICAL VALUE ALERT - BLOOD CULTURE IDENTIFICATION (BCID)  [name] is an 55 y.o. female who presented to [facility] on 10/04/2021 with a chief complaint of abd pin  Assessment:  Ecoli bacteremia - no resistance (suspected source: pyelonephritis)  Name of physician (or Provider) Contacted: Dr. Shearon Stalls  Current antibiotics: Meropenem  Changes to prescribed antibiotics recommended:  Narrow abx to Rocephin 2gm IV q24h  Results for orders placed or performed during the hospital encounter of 10/04/21  Blood Culture ID Panel (Reflexed) (Collected: 10/05/2021  2:53 AM)  Result Value Ref Range   Enterococcus faecalis NOT DETECTED NOT DETECTED   Enterococcus Faecium NOT DETECTED NOT DETECTED   Listeria monocytogenes NOT DETECTED NOT DETECTED   Staphylococcus species NOT DETECTED NOT DETECTED   Staphylococcus aureus (BCID) NOT DETECTED NOT DETECTED   Staphylococcus epidermidis NOT DETECTED NOT DETECTED   Staphylococcus lugdunensis NOT DETECTED NOT DETECTED   Streptococcus species NOT DETECTED NOT DETECTED   Streptococcus agalactiae NOT DETECTED NOT DETECTED   Streptococcus pneumoniae NOT DETECTED NOT DETECTED   Streptococcus pyogenes NOT DETECTED NOT DETECTED   A.calcoaceticus-baumannii NOT DETECTED NOT DETECTED   Bacteroides fragilis NOT DETECTED NOT DETECTED   Enterobacterales DETECTED (A) NOT DETECTED   Enterobacter cloacae complex NOT DETECTED NOT DETECTED   Escherichia coli DETECTED (A) NOT DETECTED   Klebsiella aerogenes NOT DETECTED NOT DETECTED   Klebsiella oxytoca NOT DETECTED NOT DETECTED   Klebsiella pneumoniae NOT DETECTED NOT DETECTED   Proteus species NOT DETECTED NOT DETECTED   Salmonella species NOT DETECTED NOT DETECTED   Serratia marcescens NOT DETECTED NOT DETECTED   Haemophilus influenzae NOT DETECTED NOT DETECTED   Neisseria meningitidis NOT DETECTED NOT DETECTED   Pseudomonas aeruginosa NOT DETECTED NOT DETECTED    Stenotrophomonas maltophilia NOT DETECTED NOT DETECTED   Candida albicans NOT DETECTED NOT DETECTED   Candida auris NOT DETECTED NOT DETECTED   Candida glabrata NOT DETECTED NOT DETECTED   Candida krusei NOT DETECTED NOT DETECTED   Candida parapsilosis NOT DETECTED NOT DETECTED   Candida tropicalis NOT DETECTED NOT DETECTED   Cryptococcus neoformans/gattii NOT DETECTED NOT DETECTED   CTX-M ESBL NOT DETECTED NOT DETECTED   Carbapenem resistance IMP NOT DETECTED NOT DETECTED   Carbapenem resistance KPC NOT DETECTED NOT DETECTED   Carbapenem resistance NDM NOT DETECTED NOT DETECTED   Carbapenem resist OXA 48 LIKE NOT DETECTED NOT DETECTED   Carbapenem resistance VIM NOT DETECTED NOT DETECTED    Sherlon Handing, PharmD, BCPS Please see amion for complete clinical pharmacist phone list 10/05/2021  6:21 PM

## 2021-10-05 NOTE — Progress Notes (Signed)
Dr. Lamonte Sakai notified via [place] chat of SBP <90, however MAP has maintained greater than 65 at this time. Will continue to monitor pt.

## 2021-10-05 NOTE — Progress Notes (Addendum)
NAME:  [name], MRN:  [id number], DOB:  01-30-1966, LOS: 0 ADMISSION DATE:  10/04/2021, CONSULTATION DATE:  10/05/21 REFERRING MD:  Dr Myna Hidalgo, CHIEF COMPLAINT:  Abd pain   History of Present Illness:  56 year old woman with history of metabolic syndrome with GB1/ CAD/ ischemic cardiomyopathy/NASH, lipemic spleen s/p splenectomy who presents with 2 days of diffuse myalgias, chills, N/V and abdominal pain.   Workup in ER reveals emphysematous pyelonephritis with concurrent acute renal failure, thrombocytopenia.  Also new diagnosis of cirrhosis.  Urology to be called by [facility]. PCCM asked to consult for hypotension refractory to fluids.  Pertinent  Medical History   Past Medical History:  Diagnosis Date   Anxiety    CAD (coronary artery disease)    a.  Inferior STEMI / CAD s/p DES to prox RCA 03/03/14, staged cath 03/05/14 for LAD showing significantly improved stenosis of mid LAD with resolution of prior thrombus.   Clostridium difficile colitis    Diabetes mellitus    Dyslipidemia    Fibromyalgia    GERD (gastroesophageal reflux disease)    H/O hiatal hernia    Ischemic cardiomyopathy    a. EF 45-50% by echo 03/04/14 with suspected mild acute diastolic CHF s/p dose of IV Lasix.   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 s/p splenectomy.   Sinus bradycardia    a. During 02/2014 admit, not on BB due to this.   Upper respiratory infection 2/15   states has dry non productive cough since then- "tickle in my throat" no fever    Significant Hospital Events: Including procedures, antibiotic start and stop dates in addition to other pertinent events   Admit 6/18  Interim History / Subjective:    Objective   Blood pressure 101/69, pulse 95, temperature 98.6 F (37 C), temperature source Oral, resp. rate 17, SpO2 98 %.        Intake/Output Summary (Last 24 hours) at 10/05/2021  0853 Last data filed at 10/05/2021 5176 Gross per 24 hour  Intake 1339.83 ml  Output --  Net 1339.83 ml   There were no vitals filed for this visit.  Examination: General: Elderly woman, laying in bed in no distress HENT: Oropharynx clear, somewhat dry Lungs: Clear bilaterally Cardiovascular: Regular, no murmur Abdomen: Nondistended, mild diffuse tenderness.  No rebound or guarding Extremities: No edema Neuro: Awake, alert, nonfocal GU: foley   Resolved Hospital Problem list     Assessment & Plan:   Septic shock secondary to right emphysematous pyelonephritis  -Stabilized with IV fluid resuscitation, antibiotics, bicarbonate infusion -Continue albumin resuscitation -Empiric imipenem as ordered, cultures ordered and pending -Echocardiogram ordered -Appreciate urology input, plan to hold off on any intervention for now although she may ultimately require nephrectomy.  Continue medical management and plan to reimage next 24-48 hours.  If she were to hemodynamically decompensate then she may be require more urgent surgical intervention  Acute renal failure with AGMA. 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 -Follow LFT -Okay for short-term Tylenol, avoid chronic use   Ischemic cardiomyopathy -Echocardiogram ordered -Judicious volume resuscitation -ASA '81mg'$   Immunocompromised with hx of splenectomy -Supportive care -Ceftriaxone transitioned to imipenem  Thrombocytopenia- unlikely sequestration in setting of splenectomy, more likely septic response -Follow CBC -No  signs of active bleeding -Will need to consider platelets with regard to any required procedures for pyelo   DM2 with hyperglycemia -Sliding scale insulin as ordered -Levemir 15 units twice daily (less than her home dose)   Best Practice  (right click and "Reselect all SmartList Selections" daily)   Diet/type: NPO w/ oral meds DVT prophylaxis: SCD GI prophylaxis: PPI Lines: N/A Foley:  Yes, and it is still needed Code Status:  full code Last date of multidisciplinary goals of care discussion [pending]  Labs   CBC: Recent Labs  Lab 10/04/21 2345 10/05/21 0515  WBC 12.1* 10.1  HGB 13.0 12.0  HCT 38.9 36.1  MCV 98.2 99.4  PLT 27* 21*    Basic Metabolic Panel: Recent Labs  Lab 10/04/21 2241 10/05/21 0515  NA 131* 135  K 4.0 4.2  CL 99 105  CO2 12* 10*  GLUCOSE 311* 199*  BUN 85* 82*  CREATININE 3.07* 2.75*  CALCIUM 7.7* 7.3*  MG  --  1.4*  PHOS  --  3.9   GFR: CrCl cannot be calculated (Unknown ideal weight.). Recent Labs  Lab 10/04/21 2345 10/05/21 0253 10/05/21 0514 10/05/21 0515  WBC 12.1*  --   --  10.1  LATICACIDVEN  --  6.0* 0.7  --     Liver Function Tests: Recent Labs  Lab 10/04/21 2345 10/05/21 0515  AST 18 18  ALT 20 14  ALKPHOS 136* 129*  BILITOT 0.6 0.7  PROT 6.3* 5.5*  ALBUMIN 2.0* 1.7*   Recent Labs  Lab 10/04/21 2345  LIPASE 18   No results for input(s): "AMMONIA" in the last 168 hours.  ABG    Component Value Date/Time   TCO2 25 03/03/2014 1507     Coagulation Profile: Recent Labs  Lab 10/04/21 2241  INR 1.2    Cardiac Enzymes: Recent Labs  Lab 10/04/21 2345  CKTOTAL 22*    HbA1C: Hgb A1c MFr Bld  Date/Time Value Ref Range Status  10/05/2021 05:14 AM 12.5 (H) 4.8 - 5.6 % Final    Comment:    (NOTE) Pre diabetes:          5.7%-6.4%  Diabetes:              >6.4%  Glycemic control for   <7.0% adults with diabetes   03/04/2014 02:45 AM 11.2 (H) <5.7 % Final    Comment:    (NOTE)                                                                       According to the ADA Clinical Practice Recommendations for 2011, when HbA1c is used as a screening test:  >=6.5%   Diagnostic of Diabetes Mellitus           (if abnormal result is  confirmed) 5.7-6.4%   Increased risk of developing Diabetes Mellitus References:Diagnosis and Classification of Diabetes Mellitus,Diabetes IZTI,4580,99(IPJAS 1):S62-S69 and Standards of Medical Care in         Diabetes - 2011,Diabetes Care,2011,34 (Suppl 1):S11-S61.     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   Surgical History:   Past Surgical History:  Procedure Laterality Date   ABDOMINAL HYSTERECTOMY     partial   CARDIAC CATHETERIZATION  03/05/14   improved LAD   COLONOSCOPY  12/07/2011   Rourk-normal rectum,, colon and terminal ileum   CORONARY ANGIOPLASTY WITH STENT PLACEMENT  03/03/14   DES to RCA,  ulcerated plaque in LAD   ESOPHAGOGASTRODUODENOSCOPY  08/27/2011   Rourk-reactive gastropathy, normal, patent esophagus. Stomach empty. Multiple linear antral erosions. No ulcer or infiltrating process. Small hiatal hernia. Patent pylorus. Normal first and second portion of the duodenum   LEFT HEART CATH Bilateral 03/03/2014   Procedure: LEFT HEART CATH;  Surgeon: Peter M Martinique, MD;  Location[facility] CATH LAB;  Service: Cardiovascular;  Laterality: Bilateral;   PERCUTANEOUS CORONARY STENT INTERVENTION (PCI-S) N/A 03/05/2014   Procedure: PERCUTANEOUS CORONARY STENT INTERVENTION (PCI-S);  Surgeon: Troy Sine, MD;  Location: [facility] CATH LAB;  Service: Cardiovascular;  Laterality: N/A;   SHOULDER ARTHROSCOPY WITH OPEN ROTATOR CUFF REPAIR Left 03/02/2013    Procedure: LEFT SHOULDER ARTHROSCOPY, SUBACROMIAL DECOMPRESSION, MANIPULATION UNDER ANESTHESIA;  Surgeon: [name], MD;  Location: [facility] ORS;  Service: Orthopedics;  Laterality: Left;   SHOULDER OPEN ROTATOR CUFF REPAIR Left 07/27/2013   Procedure: LEFT MINI OPEN ROTATOR CUFF REPAIR ;  Surgeon: [name], MD;  Location: [facility] ORS;  Service: Orthopedics;  Laterality: Left;   SPLENECTOMY     TONSILLECTOMY       Social History:   reports that she quit smoking about 15 years ago. Her smoking use included cigarettes. She has a 7.50 pack-year smoking history. She has never used smokeless tobacco. She reports current alcohol use. She reports that she does not use drugs.   Family History:  Her family history includes Arthritis in her mother; Diabetes in her father; Kidney disease in her father. There is no history of Colon cancer.   Allergies Allergies  Allergen Reactions   Aspartame And Phenylalanine Shortness Of Breath and Swelling    Throat swells  ANY ARTIFICAL SWEETNERS   Toradol [Ketorolac Tromethamine] Other (See Comments)    Face & neck flushed red & pt felt very hot after IV adm.   Sucralose Rash    Pt.s face broke out in rash after drinking Breeza.  Pt. Claims it is from the artificial sweetener.      Dilaudid [Hydromorphone Hcl]     Itch   Hydrocodone Itching   Oxycodone Rash    itching     Home Medications  Prior to Admission medications   Medication Sig Start Date End Date Taking? Authorizing Provider  alprazolam Duanne Moron) 2 MG tablet Take 2 mg by mouth 2 (two) times daily as needed for sleep or anxiety.    [provider]  aspirin EC 81 MG tablet Take 1 tablet (81 mg total) by mouth daily. 03/06/14   Dunn, Nedra Hai, PA-C  dexlansoprazole (DEXILANT) 60 MG capsule Take 60 mg by mouth daily.    [provider]  traMADol (ULTRAM) 50 MG tablet Take 1 tablet (50 mg total) by mouth every 6 (six) hours as needed. 11/05/16   Long, Wonda Olds, MD     Critical care  time: 32 min     Herbie Baltimore  Lamonte Sakai, MD, PhD 10/05/2021, 9:15 AM [facility] [phone] or if no answer before 7:00PM call [phone] For any issues after 7:00PM please call eLink [phone]

## 2021-10-06 ENCOUNTER — Inpatient Hospital Stay (HOSPITAL_COMMUNITY): Payer: Commercial Managed Care - HMO

## 2021-10-06 DIAGNOSIS — I428 Other cardiomyopathies: Secondary | ICD-10-CM

## 2021-10-06 DIAGNOSIS — R6521 Severe sepsis with septic shock: Secondary | ICD-10-CM | POA: Diagnosis not present

## 2021-10-06 DIAGNOSIS — N12 Tubulo-interstitial nephritis, not specified as acute or chronic: Secondary | ICD-10-CM | POA: Diagnosis not present

## 2021-10-06 DIAGNOSIS — A419 Sepsis, unspecified organism: Secondary | ICD-10-CM | POA: Diagnosis not present

## 2021-10-06 LAB — BPAM PLATELET PHERESIS
Blood Product Expiration Date: 202306192359
ISSUE DATE / TIME: 202306181021
Unit Type and Rh: 7300

## 2021-10-06 LAB — CBC WITH DIFFERENTIAL/PLATELET
Abs Immature Granulocytes: 0.48 10*3/uL — ABNORMAL HIGH (ref 0.00–0.07)
Basophils Absolute: 0.1 10*3/uL (ref 0.0–0.1)
Basophils Relative: 1 %
Eosinophils Absolute: 0.1 10*3/uL (ref 0.0–0.5)
Eosinophils Relative: 1 %
HCT: 28.6 % — ABNORMAL LOW (ref 36.0–46.0)
Hemoglobin: 10.6 g/dL — ABNORMAL LOW (ref 12.0–15.0)
Immature Granulocytes: 5 %
Lymphocytes Relative: 5 %
Lymphs Abs: 0.6 10*3/uL — ABNORMAL LOW (ref 0.7–4.0)
MCH: 33.2 pg (ref 26.0–34.0)
MCHC: 37.1 g/dL — ABNORMAL HIGH (ref 30.0–36.0)
MCV: 89.7 fL (ref 80.0–100.0)
Monocytes Absolute: 0.3 10*3/uL (ref 0.1–1.0)
Monocytes Relative: 3 %
Neutro Abs: 9.1 10*3/uL — ABNORMAL HIGH (ref 1.7–7.7)
Neutrophils Relative %: 85 %
Platelets: 7 10*3/uL — CL (ref 150–400)
RBC: 3.19 MIL/uL — ABNORMAL LOW (ref 3.87–5.11)
RDW: 14.4 % (ref 11.5–15.5)
Smear Review: DECREASED
WBC: 10.6 10*3/uL — ABNORMAL HIGH (ref 4.0–10.5)
nRBC: 1.6 % — ABNORMAL HIGH (ref 0.0–0.2)

## 2021-10-06 LAB — POCT I-STAT 7, (LYTES, BLD GAS, ICA,H+H)
Acid-base deficit: 2 mmol/L (ref 0.0–2.0)
Bicarbonate: 22.2 mmol/L (ref 20.0–28.0)
Calcium, Ion: 1.06 mmol/L — ABNORMAL LOW (ref 1.15–1.40)
HCT: 33 % — ABNORMAL LOW (ref 36.0–46.0)
Hemoglobin: 11.2 g/dL — ABNORMAL LOW (ref 12.0–15.0)
O2 Saturation: 100 %
Patient temperature: 98.6
Potassium: 3.2 mmol/L — ABNORMAL LOW (ref 3.5–5.1)
Sodium: 136 mmol/L (ref 135–145)
TCO2: 23 mmol/L (ref 22–32)
pCO2 arterial: 35.8 mmHg (ref 32–48)
pH, Arterial: 7.401 (ref 7.35–7.45)
pO2, Arterial: 177 mmHg — ABNORMAL HIGH (ref 83–108)

## 2021-10-06 LAB — COMPREHENSIVE METABOLIC PANEL
ALT: 18 U/L (ref 0–44)
AST: 30 U/L (ref 15–41)
Albumin: 2.8 g/dL — ABNORMAL LOW (ref 3.5–5.0)
Alkaline Phosphatase: 75 U/L (ref 38–126)
Anion gap: 15 (ref 5–15)
BUN: 80 mg/dL — ABNORMAL HIGH (ref 6–20)
CO2: 18 mmol/L — ABNORMAL LOW (ref 22–32)
Calcium: 7.2 mg/dL — ABNORMAL LOW (ref 8.9–10.3)
Chloride: 101 mmol/L (ref 98–111)
Creatinine, Ser: 2.12 mg/dL — ABNORMAL HIGH (ref 0.44–1.00)
GFR, Estimated: 27 mL/min — ABNORMAL LOW (ref >60)
Glucose, Bld: 200 mg/dL — ABNORMAL HIGH (ref 70–99)
Potassium: 3.3 mmol/L — ABNORMAL LOW (ref 3.5–5.1)
Sodium: 134 mmol/L — ABNORMAL LOW (ref 135–145)
Total Bilirubin: 1.8 mg/dL — ABNORMAL HIGH (ref 0.3–1.2)
Total Protein: 5.8 g/dL — ABNORMAL LOW (ref 6.5–8.1)

## 2021-10-06 LAB — ECHOCARDIOGRAM COMPLETE
AR max vel: 2.76 cm2
AV Peak grad: 6.2 mmHg
Ao pk vel: 1.24 m/s
Area-P 1/2: 5.75 cm2
Height: 62.5 in
S' Lateral: 3.8 cm
Weight: 2754.87 oz

## 2021-10-06 LAB — PREPARE PLATELET PHERESIS: Unit division: 0

## 2021-10-06 LAB — DIC (DISSEMINATED INTRAVASCULAR COAGULATION)PANEL
D-Dimer, Quant: >20 ug/mL-FEU — ABNORMAL HIGH (ref 0.00–0.50)
Fibrinogen: >800 mg/dL — ABNORMAL HIGH (ref 210–475)
INR: 1.2 (ref 0.8–1.2)
Platelets: 62 10*3/uL — ABNORMAL LOW (ref 150–400)
Prothrombin Time: 14.7 seconds (ref 11.4–15.2)
Smear Review: NONE SEEN
aPTT: 28 seconds (ref 24–36)

## 2021-10-06 LAB — GLUCOSE, CAPILLARY
Glucose-Capillary: 111 mg/dL — ABNORMAL HIGH (ref 70–99)
Glucose-Capillary: 120 mg/dL — ABNORMAL HIGH (ref 70–99)
Glucose-Capillary: 126 mg/dL — ABNORMAL HIGH (ref 70–99)
Glucose-Capillary: 162 mg/dL — ABNORMAL HIGH (ref 70–99)
Glucose-Capillary: 197 mg/dL — ABNORMAL HIGH (ref 70–99)
Glucose-Capillary: 220 mg/dL — ABNORMAL HIGH (ref 70–99)
Glucose-Capillary: 223 mg/dL — ABNORMAL HIGH (ref 70–99)
Glucose-Capillary: 269 mg/dL — ABNORMAL HIGH (ref 70–99)
Glucose-Capillary: 269 mg/dL — ABNORMAL HIGH (ref 70–99)
Glucose-Capillary: 58 mg/dL — ABNORMAL LOW (ref 70–99)

## 2021-10-06 LAB — BASIC METABOLIC PANEL
Anion gap: 15 (ref 5–15)
Anion gap: 15 (ref 5–15)
BUN: 84 mg/dL — ABNORMAL HIGH (ref 6–20)
BUN: 74 mg/dL — ABNORMAL HIGH (ref 6–20)
CO2: 19 mmol/L — ABNORMAL LOW (ref 22–32)
CO2: 18 mmol/L — ABNORMAL LOW (ref 22–32)
Calcium: 7.1 mg/dL — ABNORMAL LOW (ref 8.9–10.3)
Calcium: 7.8 mg/dL — ABNORMAL LOW (ref 8.9–10.3)
Chloride: 102 mmol/L (ref 98–111)
Chloride: 101 mmol/L (ref 98–111)
Creatinine, Ser: 2.33 mg/dL — ABNORMAL HIGH (ref 0.44–1.00)
Creatinine, Ser: 1.77 mg/dL — ABNORMAL HIGH (ref 0.44–1.00)
GFR, Estimated: 24 mL/min — ABNORMAL LOW (ref >60)
GFR, Estimated: 33 mL/min — ABNORMAL LOW (ref >60)
Glucose, Bld: 91 mg/dL (ref 70–99)
Glucose, Bld: 301 mg/dL — ABNORMAL HIGH (ref 70–99)
Potassium: 3.4 mmol/L — ABNORMAL LOW (ref 3.5–5.1)
Potassium: 3.9 mmol/L (ref 3.5–5.1)
Sodium: 136 mmol/L (ref 135–145)
Sodium: 134 mmol/L — ABNORMAL LOW (ref 135–145)

## 2021-10-06 LAB — MAGNESIUM: Magnesium: 1.9 mg/dL (ref 1.7–2.4)

## 2021-10-06 LAB — CK: Total CK: 23 U/L — ABNORMAL LOW (ref 38–234)

## 2021-10-06 LAB — CULTURE, BLOOD (ROUTINE X 2)

## 2021-10-06 LAB — PHOSPHORUS: Phosphorus: 2.2 mg/dL — ABNORMAL LOW (ref 2.5–4.6)

## 2021-10-06 MED ORDER — CALCIUM GLUCONATE-NACL 1-0.675 GM/50ML-% IV SOLN
1.0000 g | Freq: Once | INTRAVENOUS | Status: AC
Start: 2021-10-06 — End: 2021-10-06
  Administered 2021-10-06: 1000 mg via INTRAVENOUS
  Filled 2021-10-06: qty 50

## 2021-10-06 MED ORDER — DEXTROSE 50 % IV SOLN
12.5000 g | INTRAVENOUS | Status: AC
Start: 2021-10-06 — End: 2021-10-06
  Administered 2021-10-06: 12.5 g via INTRAVENOUS
  Filled 2021-10-06: qty 50

## 2021-10-06 MED ORDER — SODIUM CHLORIDE 0.9 % IV SOLN
1.0000 g | Freq: Once | INTRAVENOUS | Status: DC
  Filled 2021-10-06: qty 10

## 2021-10-06 MED ORDER — DEXTROSE 50 % IV SOLN
12.5000 g | Freq: Once | INTRAVENOUS | Status: DC
Start: 2021-10-06 — End: 2021-10-06

## 2021-10-06 MED ORDER — POTASSIUM PHOSPHATES 15 MMOLE/5ML IV SOLN
15.0000 mmol | Freq: Once | INTRAVENOUS | Status: AC
  Administered 2021-10-06: 15 mmol via INTRAVENOUS
  Filled 2021-10-06: qty 5

## 2021-10-06 MED ORDER — OXYCODONE HCL ER 10 MG PO T12A
10.0000 mg | EXTENDED_RELEASE_TABLET | Freq: Two times a day (BID) | ORAL | Status: DC
  Filled 2021-10-06: qty 1

## 2021-10-06 MED ORDER — FUROSEMIDE 10 MG/ML IJ SOLN
20.0000 mg | Freq: Once | INTRAMUSCULAR | Status: AC
  Administered 2021-10-06: 20 mg via INTRAVENOUS
  Filled 2021-10-06: qty 2

## 2021-10-06 MED ORDER — MIDODRINE HCL 5 MG PO TABS
10.0000 mg | ORAL_TABLET | Freq: Three times a day (TID) | ORAL | Status: DC
  Administered 2021-10-06 – 2021-10-08 (×4): 10 mg via ORAL
  Filled 2021-10-06 (×4): qty 2

## 2021-10-06 MED ORDER — SODIUM CHLORIDE 0.9% IV SOLUTION: Freq: Once | INTRAVENOUS | Status: AC

## 2021-10-06 MED ORDER — POTASSIUM CHLORIDE 10 MEQ/100ML IV SOLN
10.0000 meq | INTRAVENOUS | Status: AC
  Administered 2021-10-06 (×2): 10 meq via INTRAVENOUS
  Filled 2021-10-06 (×2): qty 100

## 2021-10-06 MED ORDER — DEXTROSE-NACL 10-0.45 % IV SOLN
INTRAVENOUS | Status: DC
Start: 2021-10-06 — End: 2021-10-07
  Filled 2021-10-06 (×5): qty 1000

## 2021-10-06 MED ORDER — TRAMADOL HCL 50 MG PO TABS: 50.0000 mg | ORAL_TABLET | Freq: Two times a day (BID) | ORAL | Status: DC | PRN

## 2021-10-06 MED ORDER — MAGNESIUM SULFATE 2 GM/50ML IV SOLN
2.0000 g | Freq: Once | INTRAVENOUS | Status: AC
  Administered 2021-10-06: 2 g via INTRAVENOUS
  Filled 2021-10-06: qty 50

## 2021-10-06 NOTE — Progress Notes (Signed)
eLink Physician-Brief Progress Note Patient Name: [name] DOB: [birth year] MRN: [id number]   Date of Service  10/06/2021  HPI/Events of Note  Notified of hypoglycemia with glucose at 58 despite D5LR @ 50cc/hr.   eICU Interventions  Change IVFs to D10 1/2 NS @ 50cc/hr.     Intervention Category Intermediate Interventions: Other:  [name] 10/06/2021, 12:14 AM

## 2021-10-06 NOTE — Progress Notes (Signed)
eLink Physician-Brief Progress Note Patient Name: [name] DOB: Apr 16, 1966 MRN: [id number]   Date of Service  10/06/2021  HPI/Events of Note  Nurses report increasing oxygen needs.  Now on bipap.  Cxr at 1500 showed severe bilat infiltrates.  Lasix given with good response.  eICU Interventions  Continue bipap Abg on bipap Repeat cxr     Intervention Category Major Interventions: Hypoxemia - evaluation and management  Mauri [place], P 10/06/2021, 9:56 PM

## 2021-10-06 NOTE — Progress Notes (Signed)
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  No longer requiring any pressors.  Continues to have thrombocytopenia with most recent platelet count of 7000.  Not feeling much better.  Creatinine improving.  Most recently 2.12.  Blood culture positive for E. coli.  Sensitivities pending  Principal Problem:   Emphysematous pyelonephritis Active Problems:   Insulin-requiring or dependent type II diabetes mellitus (HCC)   CAD (coronary artery disease)   Hypotension   Thrombocytopenia (HCC)   Acute renal failure (ARF) (HCC)   Elevated troponin   Acidosis   Cirrhosis ([place])   Septic shock (HCC)  Current Facility-Administered Medications  Medication Dose Route Frequency Provider Last Rate Last Admin   0.9 %  sodium chloride infusion (Manually program via Guardrails IV Fluids)   Intravenous Once Candee Furbish, MD   Held at 10/05/21 0517   0.9 %  sodium chloride infusion  250 mL Intravenous Continuous Candee Furbish, MD   Held at 10/05/21 6283   acetaminophen (TYLENOL) tablet 650 mg  650 mg Oral Q6H PRN Vianne Bulls, MD   650 mg at 10/06/21 1750   Or   acetaminophen (TYLENOL) suppository 650 mg  650 mg Rectal Q6H PRN Opyd, Ilene Qua, MD       ALPRAZolam Duanne Moron) tablet 2 mg  2 mg Oral BID PRN Candee Furbish, MD       cefTRIAXone (ROCEPHIN) 2 g in sodium chloride 0.9 % 100 mL IVPB  2 g Intravenous Q24H Spero Geralds, MD   Stopped at 10/05/21 2049   Chlorhexidine Gluconate Cloth 2 % PADS 6 each  6 each Topical Daily Candee Furbish, MD   6 each at 10/06/21 0900   dextrose 10 % and 0.45 % NaCl infusion   Intravenous Continuous Magdalen Spatz, NP 25 mL/hr at 10/06/21  1700 Infusion Verify at 10/06/21 1700   insulin aspart (novoLOG) injection 0-6 Units  0-6 Units Subcutaneous Q4H Opyd, Ilene Qua, MD   2 Units at 10/06/21 1701   lip balm (CARMEX) ointment   Topical PRN Collene Gobble, MD       midodrine (PROAMATINE) tablet 10 mg  10 mg Oral Q8H Magdalen Spatz, NP   10 mg at 10/06/21 1057   norepinephrine (LEVOPHED) '4mg'$  in 233m (0.016 mg/mL) premix infusion  0-20 mcg/min Intravenous Titrated YElsie Lincoln MD   Stopped at 10/06/21 0855   ondansetron (ZOFRAN) tablet 4 mg  4 mg Oral Q6H PRN Opyd, TIlene Qua MD       Or   ondansetron (ZOFRAN) injection 4 mg  4 mg Intravenous Q6H PRN Opyd, TIlene Qua MD       oxyCODONE (OXYCONTIN) 12 hr tablet 10 mg  10 mg Oral Q12H GMagdalen Spatz NP       pantoprazole (PROTONIX) EC tablet 40 mg  40 mg Oral Daily SCandee Furbish MD   40 mg at 10/06/21 1057   potassium PHOSPHATE 15 mmol in dextrose 5 % 250 mL infusion  15 mmol Intravenous Once GMagdalen Spatz NP 43 mL/hr at 10/06/21 1700 Infusion Verify at 10/06/21 1700   sodium chloride flush (NS) 0.9 % injection 3 mL  3 mL Intravenous Q12H Opyd, TIlene Qua MD   3  mL at 10/06/21 1056     Objective: Vital: Vitals:   10/06/21 1700 10/06/21 1715 10/06/21 1730 10/06/21 1745  BP: (!) 113/58 (!) 112/59 (!) 116/59 (!) 115/59  Pulse: 80 81 78 82  Resp: '18 19 19 18  '$ Temp:      TempSrc:      SpO2: (!) 89% 91% 92% 91%  Weight:      Height:       I/Os: I/O last 3 completed shifts: In: 5214.2 [I.V.:2261.8; Blood:373; IV Piggyback:2579.4] Out: 998 [Urine:998]  Physical Exam:  General: Patient is in no apparent distress Lungs: Normal respiratory effort, chest expands symmetrically. GI: The abdomen is soft and nontender without mass. Foley: Draining clear yellow urine Ext: lower extremities symmetric  Lab Results: Recent Labs    10/05/21 1646 10/05/21 2239 10/06/21 0925  WBC 6.9 8.2 10.6*  HGB 10.9* 10.7* 10.6*  HCT 30.8* 29.0* 28.6*   Recent Labs    10/05/21 2239  10/06/21 0211 10/06/21 0925  NA 134* 136 134*  K 3.4* 3.4* 3.3*  CL 102 102 101  CO2 19* 19* 18*  GLUCOSE 77 91 200*  BUN 82* 84* 80*  CREATININE 2.41* 2.33* 2.12*  CALCIUM 7.1* 7.1* 7.2*   Recent Labs    10/04/21 2241  INR 1.2   No results for input(s): "LABURIN" in the last 72 hours. Results for orders placed or performed during the hospital encounter of 10/04/21  Blood culture (routine x 2)     Status: None (Preliminary result)   Collection Time: 10/05/21  2:53 AM   Specimen: BLOOD  Result Value Ref Range Status   Specimen Description BLOOD RIGHT ARM  Final   Special Requests   Final    BOTTLES DRAWN AEROBIC AND ANAEROBIC Blood Culture results may not be optimal due to an inadequate volume of blood received in culture bottles   Culture  Setup Time   Final    GRAM NEGATIVE RODS IN BOTH AEROBIC AND ANAEROBIC BOTTLES CRITICAL VALUE NOTED.  VALUE IS CONSISTENT WITH PREVIOUSLY REPORTED AND CALLED VALUE.    Culture   Final    GRAM NEGATIVE RODS IDENTIFICATION TO FOLLOW Performed at [facility], [place] [address], [place], [place] [address]    Report Status PENDING  Incomplete  Blood culture (routine x 2)     Status: Abnormal (Preliminary result)   Collection Time: 10/05/21  2:53 AM   Specimen: BLOOD  Result Value Ref Range Status   Specimen Description BLOOD LEFT HAND  Final   Special Requests   Final    BOTTLES DRAWN AEROBIC AND ANAEROBIC Blood Culture results may not be optimal due to an inadequate volume of blood received in culture bottles   Culture  Setup Time   Final    GRAM NEGATIVE RODS IN BOTH AEROBIC AND ANAEROBIC BOTTLES CRITICAL RESULT CALLED TO, READ BACK BY AND VERIFIED WITH: PHARMD K.AMEND AT 1636 ON 10/05/2021 BY T.SAAD.    Culture (A)  Final    ESCHERICHIA COLI SUSCEPTIBILITIES TO FOLLOW Performed at [facility], [place] [address], [place], [place] [address]    Report Status PENDING  Incomplete  SARS Coronavirus 2 by RT PCR (hospital order,  performed in [facility] hospital lab) *cepheid single result test* Anterior Nasal Swab     Status: None   Collection Time: 10/05/21  2:53 AM   Specimen: Anterior Nasal Swab  Result Value Ref Range Status   SARS Coronavirus 2 by RT PCR NEGATIVE NEGATIVE Final  Comment: (NOTE) SARS-CoV-2 target nucleic acids are NOT DETECTED.  The SARS-CoV-2 RNA is generally detectable in upper and lower respiratory specimens during the acute phase of infection. The lowest concentration of SARS-CoV-2 viral copies this assay can detect is 250 copies / mL. A negative result does not preclude SARS-CoV-2 infection and should not be used as the sole basis for treatment or other patient management decisions.  A negative result may occur with improper specimen collection / handling, submission of specimen other than nasopharyngeal swab, presence of viral mutation(s) within the areas targeted by this assay, and inadequate number of viral copies (<250 copies / mL). A negative result must be combined with clinical observations, patient history, and epidemiological information.  Fact Sheet for Patients:   https://www.patel.info/  Fact Sheet for Healthcare Providers: https://hall.com/  This test is not yet approved or  cleared by the [place] FDA and has been authorized for detection and/or diagnosis of SARS-CoV-2 by FDA under an Emergency Use Authorization (EUA).  This EUA will remain in effect (meaning this test can be used) for the duration of the COVID-19 declaration under Section 564(b)(1) of the Act, 21 U.S.C. section 360bbb-3(b)(1), unless the authorization is terminated or revoked sooner.  Performed at [facility], [place] [address] [place], [place] [address]   Blood Culture ID Panel (Reflexed)     Status: Abnormal   Collection Time: 10/05/21  2:53 AM  Result Value Ref Range Status   Enterococcus faecalis NOT DETECTED NOT DETECTED Final    Enterococcus Faecium NOT DETECTED NOT DETECTED Final   Listeria monocytogenes NOT DETECTED NOT DETECTED Final   Staphylococcus species NOT DETECTED NOT DETECTED Final   Staphylococcus aureus (BCID) NOT DETECTED NOT DETECTED Final   Staphylococcus epidermidis NOT DETECTED NOT DETECTED Final   Staphylococcus lugdunensis NOT DETECTED NOT DETECTED Final   Streptococcus species NOT DETECTED NOT DETECTED Final   Streptococcus agalactiae NOT DETECTED NOT DETECTED Final   Streptococcus pneumoniae NOT DETECTED NOT DETECTED Final   Streptococcus pyogenes NOT DETECTED NOT DETECTED Final   A.calcoaceticus-baumannii NOT DETECTED NOT DETECTED Final   Bacteroides fragilis NOT DETECTED NOT DETECTED Final   Enterobacterales DETECTED (A) NOT DETECTED Final    Comment: Enterobacterales represent a large order of gram negative bacteria, not a single organism. CRITICAL RESULT CALLED TO, READ BACK BY AND VERIFIED WITH: PHARMD K.AMEND AT 1636 ON 10/05/2021 BY T.SAAD.    Enterobacter cloacae complex NOT DETECTED NOT DETECTED Final   Escherichia coli DETECTED (A) NOT DETECTED Final    Comment: CRITICAL RESULT CALLED TO, READ BACK BY AND VERIFIED WITH: PHARMD K.AMEND AT 1636 ON 10/05/2021 BY T.SAAD.    Klebsiella aerogenes NOT DETECTED NOT DETECTED Final   Klebsiella oxytoca NOT DETECTED NOT DETECTED Final   Klebsiella pneumoniae NOT DETECTED NOT DETECTED Final   Proteus species NOT DETECTED NOT DETECTED Final   Salmonella species NOT DETECTED NOT DETECTED Final   Serratia marcescens NOT DETECTED NOT DETECTED Final   Haemophilus influenzae NOT DETECTED NOT DETECTED Final   Neisseria meningitidis NOT DETECTED NOT DETECTED Final   Pseudomonas aeruginosa NOT DETECTED NOT DETECTED Final   Stenotrophomonas maltophilia NOT DETECTED NOT DETECTED Final   Candida albicans NOT DETECTED NOT DETECTED Final   Candida auris NOT DETECTED NOT DETECTED Final   Candida glabrata NOT DETECTED NOT DETECTED Final   Candida  krusei NOT DETECTED NOT DETECTED Final   Candida parapsilosis NOT DETECTED NOT DETECTED Final   Candida tropicalis NOT DETECTED NOT DETECTED Final  Cryptococcus neoformans/gattii NOT DETECTED NOT DETECTED Final   CTX-M ESBL NOT DETECTED NOT DETECTED Final   Carbapenem resistance IMP NOT DETECTED NOT DETECTED Final   Carbapenem resistance KPC NOT DETECTED NOT DETECTED Final   Carbapenem resistance NDM NOT DETECTED NOT DETECTED Final   Carbapenem resist OXA 48 LIKE NOT DETECTED NOT DETECTED Final   Carbapenem resistance VIM NOT DETECTED NOT DETECTED Final    Comment: Performed at [facility], [place] [address] [place], [place] [address]  Urine Culture     Status: Abnormal (Preliminary result)   Collection Time: 10/05/21  3:11 AM   Specimen: Urine, Clean Catch  Result Value Ref Range Status   Specimen Description URINE, CLEAN CATCH  Final   Special Requests NONE  Final   Culture (A)  Final    >=100,000 COLONIES/mL ESCHERICHIA COLI SUSCEPTIBILITIES TO FOLLOW Performed at [facility], [place] [address] [place], [place] [address]    Report Status PENDING  Incomplete  MRSA Next Gen by PCR, Nasal     Status: None   Collection Time: 10/05/21  6:01 AM   Specimen: Nasal Mucosa; Nasal Swab  Result Value Ref Range Status   MRSA by PCR Next Gen NOT DETECTED NOT DETECTED Final    Comment: (NOTE) The GeneXpert MRSA Assay (FDA approved for NASAL specimens only), is one component of a comprehensive MRSA colonization surveillance program. It is not intended to diagnose MRSA infection nor to guide or monitor treatment for MRSA infections. Test performance is not FDA approved in patients less than 38 years old. Performed at [facility], [place] [address] [place], [place] [address]     Studies/Results: ECHOCARDIOGRAM COMPLETE  Result Date: 10/06/2021    ECHOCARDIOGRAM REPORT   Patient Name:   [name] Date of Exam: 10/06/2021 Medical Rec #:  [id number]       Height:        62.5 in Accession #:    [id number]      Weight:       172.2 lb Date of Birth:  1965/11/12       BSA:          1.805 m Patient Age:    56 years        BP:           100/55 mmHg Patient Gender: F               HR:           83 bpm. Exam Location:  Inpatient Procedure: 2D Echo, Cardiac Doppler and Color Doppler Indications:    Cardiomyopathy  History:        Patient has prior history of Echocardiogram examinations, most                 recent 03/04/2014. CAD; Risk Factors:Diabetes and Hypertension.  Sonographer:    Jefferey Pica Referring Phys: [id number] [place]  1. Left ventricular ejection fraction, by estimation, is 40 to 45%. The left ventricle has mildly decreased function. The left ventricle has no regional wall motion abnormalities. Left ventricular diastolic parameters were normal.  2. Right ventricular systolic function is normal. The right ventricular size is normal. There is normal pulmonary artery systolic pressure.  3. Left atrial size was mildly dilated.  4. Right atrial size was mildly dilated.  5. The mitral valve is normal in structure. Trivial mitral valve regurgitation. No evidence of mitral stenosis.  6. The aortic valve is tricuspid.  There is mild calcification of the aortic valve. Aortic valve regurgitation is not visualized. Aortic valve sclerosis/calcification is present, without any evidence of aortic stenosis.  7. The inferior vena cava is normal in size with greater than 50% respiratory variability, suggesting right atrial pressure of 3 mmHg. FINDINGS  Left Ventricle: Left ventricular ejection fraction, by estimation, is 40 to 45%. The left ventricle has mildly decreased function. The left ventricle has no regional wall motion abnormalities. The left ventricular internal cavity size was normal in size. There is no left ventricular hypertrophy. Left ventricular diastolic parameters were normal. Right Ventricle: The right ventricular size is normal. No increase in right  ventricular wall thickness. Right ventricular systolic function is normal. There is normal pulmonary artery systolic pressure. The tricuspid regurgitant velocity is 2.15 m/s, and  with an assumed right atrial pressure of 15 mmHg, the estimated right ventricular systolic pressure is 18.8 mmHg. Left Atrium: Left atrial size was mildly dilated. Right Atrium: Right atrial size was mildly dilated. Pericardium: There is no evidence of pericardial effusion. Mitral Valve: The mitral valve is normal in structure. Trivial mitral valve regurgitation. No evidence of mitral valve stenosis. Tricuspid Valve: The tricuspid valve is normal in structure. Tricuspid valve regurgitation is trivial. No evidence of tricuspid stenosis. Aortic Valve: The aortic valve is tricuspid. There is mild calcification of the aortic valve. Aortic valve regurgitation is not visualized. Aortic valve sclerosis/calcification is present, without any evidence of aortic stenosis. Aortic valve peak gradient measures 6.2 mmHg. Pulmonic Valve: The pulmonic valve was normal in structure. Pulmonic valve regurgitation is trivial. No evidence of pulmonic stenosis. Aorta: The aortic root is normal in size and structure. Venous: The inferior vena cava is normal in size with greater than 50% respiratory variability, suggesting right atrial pressure of 3 mmHg. IAS/Shunts: The interatrial septum appears to be lipomatous. No atrial level shunt detected by color flow Doppler.  LEFT VENTRICLE PLAX 2D LVIDd:         4.60 cm   Diastology LVIDs:         3.80 cm   LV e' medial:    6.66 cm/s LV PW:         1.00 cm   LV E/e' medial:  11.7 LV IVS:        1.10 cm   LV e' lateral:   9.25 cm/s LVOT diam:     2.00 cm   LV E/e' lateral: 8.4 LV SV:         75 LV SV Index:   41 LVOT Area:     3.14 cm  RIGHT VENTRICLE            IVC RV Basal diam:  3.00 cm    IVC diam: 2.50 cm RV S prime:     9.07 cm/s TAPSE (M-mode): 2.2 cm LEFT ATRIUM             Index        RIGHT ATRIUM            Index LA diam:        3.50 cm 1.94 cm/m   RA Area:     17.60 cm LA Vol (A2C):   45.5 ml 25.21 ml/m  RA Volume:   53.20 ml  29.48 ml/m LA Vol (A4C):   48.0 ml 26.60 ml/m LA Biplane Vol: 47.3 ml 26.21 ml/m  AORTIC VALVE                 PULMONIC VALVE AV  Area (Vmax): 2.76 cm     PV Vmax:       0.59 m/s AV Vmax:        124.00 cm/s  PV Peak grad:  1.4 mmHg AV Peak Grad:   6.2 mmHg LVOT Vmax:      109.00 cm/s LVOT Vmean:     73.000 cm/s LVOT VTI:       0.238 m  AORTA Ao Root diam: 3.10 cm Ao Asc diam:  3.20 cm MITRAL VALVE               TRICUSPID VALVE MV Area (PHT): 5.75 cm    TR Peak grad:   18.5 mmHg MV Decel Time: 132 msec    TR Vmax:        215.00 cm/s MV E velocity: 78.10 cm/s MV A velocity: 56.30 cm/s  SHUNTS MV E/A ratio:  1.39        Systemic VTI:  0.24 m                            Systemic Diam: 2.00 cm Glori Bickers MD Electronically signed by Glori Bickers MD Signature Date/Time: 10/06/2021/3:52:31 PM    Final    DG CHEST PORT 1 VIEW  Result Date: 10/06/2021 CLINICAL DATA:  Dyspnea, emphysematous pyelonephritis EXAM: PORTABLE CHEST 1 VIEW COMPARISON:  10/04/2021 FINDINGS: Transverse diameter of heart is increased. Diffuse alveolar densities seen in the both lungs, more so on the left side. There is blunting of both lateral CP angles. There is no pneumothorax. IMPRESSION: Diffuse increase in alveolar densities seen in both lungs, more so on the left side suggesting pulmonary edema. Possibility of underlying pneumonia is not excluded. Small bilateral pleural effusions. Electronically Signed   By: Elmer Picker M.D.   On: 10/06/2021 15:06   CT ABDOMEN PELVIS WO CONTRAST  Result Date: 10/05/2021 CLINICAL DATA:  Nausea and vomiting.  Hypotensive. EXAM: CT ABDOMEN AND PELVIS WITHOUT CONTRAST TECHNIQUE: Multidetector CT imaging of the abdomen and pelvis was performed following the standard protocol without IV contrast. RADIATION DOSE REDUCTION: This exam was performed according to the  departmental dose-optimization program which includes automated exposure control, adjustment of the mA and/or kV according to patient size and/or use of iterative reconstruction technique. COMPARISON:  01/19/2012 FINDINGS: Lower chest: Atelectasis in the lung bases. Small esophageal hiatal hernia. Hepatobiliary: Hepatic changes suggest cirrhosis with enlarged lateral segment left and caudate lobes. Serpiginous portal and upper abdominal vessel suggesting varices. Gallbladder and bile ducts are unremarkable. Pancreas: Unremarkable. No pancreatic ductal dilatation or surrounding inflammatory changes. Spleen: Surgical absence of the spleen. Adrenals/Urinary Tract: No adrenal gland nodules. Sub capsular and soft tissue gas collections demonstrated within the right kidney, likely indicating infection with gas-forming organism. The estrogenic or traumatic causes could also have this appearance. Correlate with history of any biopsy or other procedure. No hydronephrosis or hydroureter. Bladder is normal. Stomach/Bowel: Stomach is within normal limits. Appendix appears normal. No evidence of bowel wall thickening, distention, or inflammatory changes. Vascular/Lymphatic: Calcification of the aorta. No aneurysm. Prominent lymph nodes demonstrated in the celiac axis, mesenteric, and retroperitoneum. Largest measure about 1.3 cm short axis dimension. Similar appearance to previous study, likely indicating reactive or cirrhotic related nodes. Reproductive: Status post hysterectomy. No adnexal masses. Other: No free air or free fluid in the abdomen. Inflammatory stranding is demonstrated along the right pericolic gutters. Musculoskeletal: No acute or significant osseous findings. IMPRESSION: 1. Subcapsular and parenchymal gas collections within  the right kidney. Unless there is been recent biopsy or other procedure, this is likely to represent infection with gas-forming organism. Inflammatory stranding suggested along the right  pericolic gutter. 2. 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 3. Aortic atherosclerosis. Electronically Signed   By: Lucienne Capers M.D.   On: 10/05/2021 01:35   DG Chest 2 View  Result Date: 10/04/2021 CLINICAL DATA:  Chest pain, vomiting, weakness EXAM: CHEST - 2 VIEW COMPARISON:  09/19/2014 FINDINGS: Normal heart size and pulmonary vascularity. No focal airspace disease or consolidation in the lungs. No blunting of costophrenic angles. No pneumothorax. Mediastinal contours appear intact. Coronary stents are demonstrated. Calcification of the aorta. IMPRESSION: No active cardiopulmonary disease. Electronically Signed   By: Lucienne Capers M.D.   On: 10/04/2021 23:14    Assessment: Emphysematous pyelonephritis Sepsis secondary to E. coli bacteremia secondary to the above  Plan: Continue aggressive medical management.  Continue antibiotics.  She has a CT scan scheduled for overnight.  We will make her n.p.o. at midnight.  We will check scan in the morning and see if she is a candidate for percutaneous tube placement.  This would be ideal as nephrectomy has high mortality rate compared to medical management and should be reserved as a last resort.  We will recheck an INR.   Link Snuffer, MD Urology 10/06/2021, 6:00 PM

## 2021-10-06 NOTE — Progress Notes (Signed)
Patient having desaturation issues while on nasal cannula. Placed patient on heated high flow at 100% with 25LPM, Sp02 was 88-90%. Placed patient on bipap Sp02 increased to 97%, will obtain abg.

## 2021-10-06 NOTE — Progress Notes (Cosign Needed)
Interim Note CBC results drawn this am have resulted.   CBC results reviewed. HGB 10.6/ WBC 10.6/ Platelets are 7,000 No active bleeding.  Will order platelets 2 units now Will check DIC panel post transfusion   Magdalen Spatz, MSN, AGACNP-BC [place] for personal pager PCCM on call pager [phone]

## 2021-10-06 NOTE — Progress Notes (Addendum)
NAME:  [name], MRN:  [id number], DOB:  [birth year], LOS: 1 ADMISSION DATE:  10/04/2021, CONSULTATION DATE:  10/05/21 REFERRING MD:  Dr Myna Hidalgo, CHIEF COMPLAINT:  Abd pain   History of Present Illness:  56 year old woman with history of metabolic syndrome with YJ8/ CAD/ ischemic cardiomyopathy/NASH, lipemic spleen s/p splenectomy who presents with 2 days of diffuse myalgias, chills, N/V and abdominal pain. Workup in ER reveals emphysematous pyelonephritis with concurrent acute renal failure, thrombocytopenia. Also new diagnosis of cirrhosis.  Urology to be called by [facility]. PCCM asked to consult for hypotension refractory to fluids.  Pertinent  Medical History   Past Medical History:  Diagnosis Date   Anxiety    CAD (coronary artery disease)    a.  Inferior STEMI / CAD s/p DES to prox RCA 03/03/14, staged cath 03/05/14 for LAD showing significantly improved stenosis of mid LAD with resolution of prior thrombus.   Clostridium difficile colitis    Diabetes mellitus    Dyslipidemia    Fibromyalgia    GERD (gastroesophageal reflux disease)    H/O hiatal hernia    Ischemic cardiomyopathy    a. EF 45-50% by echo 03/04/14 with suspected mild acute diastolic CHF s/p dose of IV Lasix.   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 s/p splenectomy.   Sinus bradycardia    a. During 02/2014 admit, not on BB due to this.   Upper respiratory infection 2/15   states has dry non productive cough since then- "tickle in my throat" no fever    Significant Hospital Events: Including procedures, antibiotic start and stop dates in addition to other pertinent events   Admit 6/18  Interim History / Subjective:  Pt required pressors overnight after pain medication  Currently on Levo at 1 mcg/min Required D10 initiation overnight due to low blood sugars despite D5. ( Dropped to 58)  Objective   Blood  pressure 106/62, pulse 79, temperature 98 F (36.7 C), temperature source Oral, resp. rate 16, weight 78.1 kg, SpO2 97 %.        Intake/Output Summary (Last 24 hours) at 10/06/2021 0845 Last data filed at 10/06/2021 0800 Gross per 24 hour  Intake 3527.7 ml  Output 1073 ml  Net 2454.7 ml   Filed Weights   10/06/21 0730  Weight: 78.1 kg    Examination: General: Elderly woman, laying in bed , states she is sore/ has pain  all over HENT: Oropharynx clear, somewhat dry Lungs: Bilateral chest excursion, Clear throughout, diminished per bases  Cardiovascular: S1, S2, RRR,  no murmur, rub or gallop Abdomen: Nondistended, mild diffuse tenderness.  No rebound or guarding Extremities: No edema, no obvious deformities Neuro: Awake, alert, nonfocal GU: foley with adequate UO  Na 136/ K 3.4/ Cl 102/ CO2 19/BUN 84/ Creatinine 2.33 ( 2.41) / Ca 7.1>> Corrects to 8.5 with albumin of 2.3/ GAP 15, Phos 2.2 Net + 3.9 L  Urine output 998 last 24 hours T max 98.6  Resolved Hospital Problem list     Assessment & Plan:   Septic shock secondary to right emphysematous pyelonephritis  E Coli per Blood Cultures drawn 6/18 -Stabilized with IV fluid resuscitation, antibiotics, bicarbonate infusion - Will add midodrine to stabilize BP as patient had BP drops with pain medication   -Continue albumin resuscitation -Continue Rocephin  - Trend fever and WBC curve -Echocardiogram ordered>> Done results  pending  -Appreciate urology input, plan to hold off on any intervention for now although she may ultimately require nephrectomy.  Continue medical management and plan to reimage next 24-48 hours.  If she were to hemodynamically decompensate then she may be require more urgent surgical intervention CT Abdomen without Contrast ordered for 6/20>> If she decompensates, expedite order  Acute renal failure with AGMA. Lactate cleared, now normal>> Creatinine 2.33 from 2.41 -Due to septic shock/severe  sepsis -Follow BMP, urine output with maintain renal perfusion  - Replete K Phos and Mag 6/19   NASH cirrhosis with evidence of portal hypertension on imaging -Follow LFT -Okay for short-term Tylenol, avoid chronic use   Ischemic cardiomyopathy -Echocardiogram ordered>> Done , read pending  -Judicious volume resuscitation -ASA '81mg'$   Immunocompromised with hx of splenectomy -Supportive care -Continue Ceftriaxone  - Trend fever Curve and WBC  Thrombocytopenia- unlikely sequestration in setting of splenectomy, more likely septic response -Follow CBC>>NOt drawn 6/19, will draw now-No signs of active bleeding -Will need to consider platelets with regard to any required procedures for pyelo   DM2 with hyperglycemia Overnight hypoglycemic 6/19 -Sliding scale insulin as ordered -Levemir 15 units twice daily (less than her home dose)>> on hold in setting of hypoglycemia  Pain - Will DC Ultram as renal function only allows for Q 12 hour dosing - Add low dose Oxy BID starting 1800 6/19 ( Received Ultram this am)  - Monitor for rash   Best Practice (right click and "Reselect all SmartList Selections" daily)   Diet/type: Renal diet DVT prophylaxis: SCD GI prophylaxis: PPI Lines: N/A Foley:  Yes, and it is still needed Code Status:  full code Last date of multidisciplinary goals of care discussion [pending]  Labs   CBC: Recent Labs  Lab 10/04/21 2345 10/05/21 0515 10/05/21 1646 10/05/21 2239  WBC 12.1* 10.1 6.9 8.2  HGB 13.0 12.0 10.9* 10.7*  HCT 38.9 36.1 30.8* 29.0*  MCV 98.2 99.4 93.1 89.8  PLT 27* 21* 21* 15*    Basic Metabolic Panel: Recent Labs  Lab 10/04/21 2241 10/05/21 0515 10/05/21 1646 10/05/21 2239 10/06/21 0211  NA 131* 135 134* 134* 136  K 4.0 4.2 3.0* 3.4* 3.4*  CL 99 105 102 102 102  CO2 12* 10* 17* 19* 19*  GLUCOSE 311* 199* 99 77 91  BUN 85* 82* 80* 82* 84*  CREATININE 3.07* 2.75* 2.44* 2.41* 2.33*  CALCIUM 7.7* 7.3* 7.0* 7.1* 7.1*  MG   --  1.4*  --  1.3* 1.9  PHOS  --  3.9  --   --  2.2*   GFR: CrCl cannot be calculated (Unknown ideal weight.). Recent Labs  Lab 10/04/21 2345 10/05/21 0253 10/05/21 0514 10/05/21 0515 10/05/21 1646 10/05/21 2239  WBC 12.1*  --   --  10.1 6.9 8.2  LATICACIDVEN  --  6.0* 0.7  --   --   --     Liver Function Tests: Recent Labs  Lab 10/04/21 2345 10/05/21 0515 10/05/21 1646  AST '18 18 20  '$ ALT '20 14 17  '$ ALKPHOS 136* 129* 79  BILITOT 0.6 0.7 1.2  PROT 6.3* 5.5* 5.4*  ALBUMIN 2.0* 1.7* 2.3*   Recent Labs  Lab 10/04/21 2345  LIPASE 18   No results for input(s): "AMMONIA" in the last 168 hours.  ABG    Component Value Date/Time   HCO3 21.5 10/05/2021 2236   TCO2 25 03/03/2014 1507   ACIDBASEDEF 1.8 10/05/2021 2236   O2SAT 94.7 10/05/2021 2236  Coagulation Profile: Recent Labs  Lab 10/04/21 2241  INR 1.2    Cardiac Enzymes: Recent Labs  Lab 10/04/21 2345  CKTOTAL 22*    HbA1C: Hgb A1c MFr Bld  Date/Time Value Ref Range Status  10/05/2021 05:14 AM 12.5 (H) 4.8 - 5.6 % Final    Comment:    (NOTE) Pre diabetes:          5.7%-6.4%  Diabetes:              >6.4%  Glycemic control for   <7.0% adults with diabetes   03/04/2014 02:45 AM 11.2 (H) <5.7 % Final    Comment:    (NOTE)                                                                       According to the ADA Clinical Practice Recommendations for 2011, when HbA1c is used as a screening test:  >=6.5%   Diagnostic of Diabetes Mellitus           (if abnormal result is confirmed) 5.7-6.4%   Increased risk of developing Diabetes Mellitus References:Diagnosis and Classification of Diabetes Mellitus,Diabetes PYPP,5093,26(ZTIWP 1):S62-S69 and Standards of Medical Care in         Diabetes - 2011,Diabetes Care,2011,34 (Suppl 1):S11-S61.     CBG: Recent Labs  Lab 10/05/21 2359 10/06/21 0036 10/06/21 0419 10/06/21 0502 10/06/21 0748  GLUCAP 58* 111* 126* 120* 162*     Allergies Allergies  Allergen Reactions   Aspartame And Phenylalanine Shortness Of Breath and Swelling    Throat swells  ANY ARTIFICAL SWEETNERS   Toradol [Ketorolac Tromethamine] Other (See Comments)    Face & neck flushed red & pt felt very hot after IV adm.   Sucralose Rash    Pt.s face broke out in rash after drinking Breeza.  Pt. Claims it is from the artificial sweetener.      Dilaudid [Hydromorphone Hcl]     Itch   Hydrocodone Itching   Oxycodone Rash    itching     Home Medications  Prior to Admission medications   Medication Sig Start Date End Date Taking? Authorizing Provider  alprazolam Duanne Moron) 2 MG tablet Take 2 mg by mouth 2 (two) times daily as needed for sleep or anxiety.    [provider]  aspirin EC 81 MG tablet Take 1 tablet (81 mg total) by mouth daily. 03/06/14   Dunn, Nedra Hai, PA-C  dexlansoprazole (DEXILANT) 60 MG capsule Take 60 mg by mouth daily.    [provider]  traMADol (ULTRAM) 50 MG tablet Take 1 tablet (50 mg total) by mouth every 6 (six) hours as needed. 11/05/16   Long, Wonda Olds, MD     Critical care time: 35 min     Magdalen Spatz, AGACNP-BC 10/06/2021, 8:45 AM [facility] See Amion for personal pager  info For any issues after 7:00PM please call eLink [phone] 10/06/2021 8:46 AM

## 2021-10-07 ENCOUNTER — Inpatient Hospital Stay (HOSPITAL_COMMUNITY): Payer: Commercial Managed Care - HMO

## 2021-10-07 DIAGNOSIS — N12 Tubulo-interstitial nephritis, not specified as acute or chronic: Secondary | ICD-10-CM | POA: Diagnosis not present

## 2021-10-07 DIAGNOSIS — J9601 Acute respiratory failure with hypoxia: Secondary | ICD-10-CM

## 2021-10-07 LAB — BASIC METABOLIC PANEL
Anion gap: 10 (ref 5–15)
Anion gap: 12 (ref 5–15)
BUN: 66 mg/dL — ABNORMAL HIGH (ref 6–20)
BUN: 65 mg/dL — ABNORMAL HIGH (ref 6–20)
CO2: 23 mmol/L (ref 22–32)
CO2: 23 mmol/L (ref 22–32)
Calcium: 7.6 mg/dL — ABNORMAL LOW (ref 8.9–10.3)
Calcium: 7.8 mg/dL — ABNORMAL LOW (ref 8.9–10.3)
Chloride: 104 mmol/L (ref 98–111)
Chloride: 101 mmol/L (ref 98–111)
Creatinine, Ser: 1.47 mg/dL — ABNORMAL HIGH (ref 0.44–1.00)
Creatinine, Ser: 1.49 mg/dL — ABNORMAL HIGH (ref 0.44–1.00)
GFR, Estimated: 42 mL/min — ABNORMAL LOW (ref >60)
GFR, Estimated: 41 mL/min — ABNORMAL LOW (ref >60)
Glucose, Bld: 259 mg/dL — ABNORMAL HIGH (ref 70–99)
Glucose, Bld: 258 mg/dL — ABNORMAL HIGH (ref 70–99)
Potassium: 3 mmol/L — ABNORMAL LOW (ref 3.5–5.1)
Potassium: 3.6 mmol/L (ref 3.5–5.1)
Sodium: 137 mmol/L (ref 135–145)
Sodium: 136 mmol/L (ref 135–145)

## 2021-10-07 LAB — CBC WITH DIFFERENTIAL/PLATELET
Abs Immature Granulocytes: 0.47 10*3/uL — ABNORMAL HIGH (ref 0.00–0.07)
Basophils Absolute: 0.1 10*3/uL (ref 0.0–0.1)
Basophils Relative: 1 %
Eosinophils Absolute: 0.1 10*3/uL (ref 0.0–0.5)
Eosinophils Relative: 1 %
HCT: 28.7 % — ABNORMAL LOW (ref 36.0–46.0)
Hemoglobin: 10.5 g/dL — ABNORMAL LOW (ref 12.0–15.0)
Immature Granulocytes: 5 %
Lymphocytes Relative: 9 %
Lymphs Abs: 0.8 10*3/uL (ref 0.7–4.0)
MCH: 33.1 pg (ref 26.0–34.0)
MCHC: 36.6 g/dL — ABNORMAL HIGH (ref 30.0–36.0)
MCV: 90.5 fL (ref 80.0–100.0)
Monocytes Absolute: 0.2 10*3/uL (ref 0.1–1.0)
Monocytes Relative: 2 %
Neutro Abs: 7.3 10*3/uL (ref 1.7–7.7)
Neutrophils Relative %: 82 %
Platelets: 22 10*3/uL — CL (ref 150–400)
RBC: 3.17 MIL/uL — ABNORMAL LOW (ref 3.87–5.11)
RDW: 14.2 % (ref 11.5–15.5)
Smear Review: NORMAL
WBC: 8.9 10*3/uL (ref 4.0–10.5)
nRBC: 0.6 % — ABNORMAL HIGH (ref 0.0–0.2)

## 2021-10-07 LAB — PREPARE PLATELET PHERESIS
Unit division: 0
Unit division: 0

## 2021-10-07 LAB — CULTURE, BLOOD (ROUTINE X 2)

## 2021-10-07 LAB — BPAM PLATELET PHERESIS
Blood Product Expiration Date: 202306212359
Blood Product Expiration Date: 202306212359
ISSUE DATE / TIME: 202306191133
ISSUE DATE / TIME: 202306191347
Unit Type and Rh: 5100
Unit Type and Rh: 5100

## 2021-10-07 LAB — GLUCOSE, CAPILLARY
Glucose-Capillary: 232 mg/dL — ABNORMAL HIGH (ref 70–99)
Glucose-Capillary: 239 mg/dL — ABNORMAL HIGH (ref 70–99)
Glucose-Capillary: 247 mg/dL — ABNORMAL HIGH (ref 70–99)
Glucose-Capillary: 259 mg/dL — ABNORMAL HIGH (ref 70–99)
Glucose-Capillary: 260 mg/dL — ABNORMAL HIGH (ref 70–99)
Glucose-Capillary: 285 mg/dL — ABNORMAL HIGH (ref 70–99)
Glucose-Capillary: 372 mg/dL — ABNORMAL HIGH (ref 70–99)

## 2021-10-07 LAB — PROTIME-INR
INR: 1.1 (ref 0.8–1.2)
Prothrombin Time: 14.2 seconds (ref 11.4–15.2)

## 2021-10-07 LAB — URINE CULTURE: Culture: >=100000 — AB

## 2021-10-07 LAB — MAGNESIUM
Magnesium: 2.3 mg/dL (ref 1.7–2.4)
Magnesium: 2.1 mg/dL (ref 1.7–2.4)

## 2021-10-07 LAB — PHOSPHORUS: Phosphorus: 2.7 mg/dL (ref 2.5–4.6)

## 2021-10-07 MED ORDER — TRAMADOL HCL 50 MG PO TABS
50.0000 mg | ORAL_TABLET | Freq: Two times a day (BID) | ORAL | Status: DC | PRN
  Administered 2021-10-08 – 2021-10-26 (×28): 50 mg via ORAL
  Filled 2021-10-07 (×28): qty 1

## 2021-10-07 MED ORDER — ORAL CARE MOUTH RINSE: 15.0000 mL | OROMUCOSAL | Status: DC | PRN

## 2021-10-07 MED ORDER — INSULIN GLARGINE-YFGN 100 UNIT/ML ~~LOC~~ SOLN
7.0000 [IU] | Freq: Two times a day (BID) | SUBCUTANEOUS | Status: DC
  Administered 2021-10-07: 7 [IU] via SUBCUTANEOUS
  Filled 2021-10-07 (×2): qty 0.07

## 2021-10-07 MED ORDER — POTASSIUM CHLORIDE CRYS ER 20 MEQ PO TBCR
20.0000 meq | EXTENDED_RELEASE_TABLET | Freq: Once | ORAL | Status: AC
  Administered 2021-10-07: 20 meq via ORAL
  Filled 2021-10-07: qty 1

## 2021-10-07 MED ORDER — CEFAZOLIN SODIUM-DEXTROSE 2-4 GM/100ML-% IV SOLN
2.0000 g | Freq: Three times a day (TID) | INTRAVENOUS | Status: DC
  Administered 2021-10-07 – 2021-10-08 (×3): 2 g via INTRAVENOUS
  Filled 2021-10-07 (×5): qty 100

## 2021-10-07 MED ORDER — CALCIUM GLUCONATE-NACL 1-0.675 GM/50ML-% IV SOLN
1.0000 g | Freq: Once | INTRAVENOUS | Status: AC
Start: 2021-10-07 — End: 2021-10-07
  Administered 2021-10-07: 1000 mg via INTRAVENOUS
  Filled 2021-10-07: qty 50

## 2021-10-07 MED ORDER — FUROSEMIDE 10 MG/ML IJ SOLN
40.0000 mg | Freq: Once | INTRAMUSCULAR | Status: AC
Start: 2021-10-07 — End: 2021-10-07
  Administered 2021-10-07: 40 mg via INTRAVENOUS
  Filled 2021-10-07: qty 4

## 2021-10-07 MED ORDER — INSULIN GLARGINE-YFGN 100 UNIT/ML ~~LOC~~ SOLN
5.0000 [IU] | Freq: Two times a day (BID) | SUBCUTANEOUS | Status: DC
  Administered 2021-10-07 – 2021-10-08 (×2): 5 [IU] via SUBCUTANEOUS
  Filled 2021-10-07 (×3): qty 0.05

## 2021-10-07 MED ORDER — POTASSIUM CHLORIDE CRYS ER 20 MEQ PO TBCR
20.0000 meq | EXTENDED_RELEASE_TABLET | ORAL | Status: AC
  Administered 2021-10-07 (×2): 20 meq via ORAL
  Filled 2021-10-07 (×2): qty 1

## 2021-10-07 MED ORDER — POTASSIUM CHLORIDE 10 MEQ/100ML IV SOLN
10.0000 meq | INTRAVENOUS | Status: AC
  Administered 2021-10-07 (×4): 10 meq via INTRAVENOUS
  Filled 2021-10-07 (×4): qty 100

## 2021-10-07 NOTE — Progress Notes (Signed)
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  This showed persistent findings of subcapsular gas in the right kidney with the collections appearing smaller but some extension into the perirenal fatty tissues.  There was no evidence of obstruction/hydronephrosis.  She also had a significant increase in the degree of pleural effusions.  Patient was diuresed and on BiPAP.  Is being weaned off that.  Pulmonary function has improved throughout the day.  Mentation has also improved and she has been less confused today.  Has had excellent urine output and creatinine is improving.  Has not required pressors today.  Continues to have significant thrombocytopenia with platelets of 22.  Principal Problem:   Emphysematous pyelonephritis Active Problems:   Insulin-requiring or dependent type II diabetes mellitus (HCC)   CAD (coronary artery disease)   Hypotension   Thrombocytopenia (HCC)   AKI (acute kidney injury) ([place])   Elevated troponin   Acidosis   Cirrhosis ([place])   Septic shock ([place])   Acute respiratory failure with hypoxia (HCC)  Current Facility-Administered Medications  Medication Dose Route Frequency Provider Last Rate Last Admin   0.9 %  sodium chloride infusion (Manually program via Guardrails IV Fluids)   Intravenous Once Candee Furbish, MD   Held at 10/05/21 0517   0.9 %  sodium chloride infusion  250 mL Intravenous Continuous Candee Furbish, MD   Held at 10/05/21 8127   acetaminophen (TYLENOL) tablet 650 mg  650 mg Oral Q6H PRN Vianne Bulls, MD   650 mg at 10/06/21 1750   Or   acetaminophen (TYLENOL) suppository 650 mg  650 mg Rectal Q6H PRN Opyd, Ilene Qua, MD        ceFAZolin (ANCEF) IVPB 2g/100 mL premix  2 g Intravenous Q8H Estill Cotta, NP       Chlorhexidine Gluconate Cloth 2 % PADS 6 each  6 each Topical Daily Candee Furbish, MD   6 each at 10/07/21 1500   insulin aspart (novoLOG) injection 0-6 Units  0-6 Units Subcutaneous Q4H Opyd, Ilene Qua, MD   5 Units at 10/07/21 1710   insulin glargine-yfgn (SEMGLEE) injection 5 Units  5 Units Subcutaneous BID Estill Cotta, NP       lip balm (CARMEX) ointment   Topical PRN Collene Gobble, MD       midodrine (PROAMATINE) tablet 10 mg  10 mg Oral Q8H Magdalen Spatz, NP   10 mg at 10/07/21 1347   ondansetron (ZOFRAN) tablet 4 mg  4 mg Oral Q6H PRN Opyd, Ilene Qua, MD       Or   ondansetron (ZOFRAN) injection 4 mg  4 mg Intravenous Q6H PRN Opyd, Ilene Qua, MD       Oral care mouth rinse  15 mL Mouth Rinse PRN Margaretha Seeds, MD       pantoprazole (PROTONIX) EC tablet 40 mg  40 mg Oral Daily Candee Furbish, MD   40 mg at 10/07/21 1011   sodium chloride flush (NS) 0.9 % injection 3 mL  3 mL Intravenous Q12H Vianne Bulls, MD   3 mL at 10/07/21 0947     Objective: Vital: Vitals:   10/07/21 1815 10/07/21 1830 10/07/21 1845 10/07/21 1900  BP:  115/67  122/74  Pulse: 73 77 75 80  Resp: (!) 23 (!) 21 (!) 21 18  Temp:      TempSrc:      SpO2: 95% 95% 96% 96%  Weight:      Height:       I/Os: I/O last 3 completed shifts: In: 3130.5 [P.O.:633; I.V.:1254.1; Blood:336; IV Piggyback:907.4] Out: 6024 [WNUUV:2536]  Physical Exam:  General: Patient is in no apparent distress Lungs: Normal respiratory effort, chest expands symmetrically. GI: The abdomen is soft and nontender without mass. Foley: Draining clear yellow urine Ext: lower extremities symmetric  Lab Results: Recent Labs    10/05/21 2239 10/06/21 0925 10/06/21 2206 10/07/21 0611  WBC 8.2 10.6*  --  8.9  HGB 10.7* 10.6* 11.2* 10.5*  HCT 29.0* 28.6* 33.0* 28.7*   Recent Labs    10/06/21 2038 10/06/21 2206 10/07/21 0611  10/07/21 1225  NA 134* 136 137 136  K 3.9 3.2* 3.0* 3.6  CL 101  --  104 101  CO2 18*  --  23 23  GLUCOSE 301*  --  259* 258*  BUN 74*  --  66* 65*  CREATININE 1.77*  --  1.47* 1.49*  CALCIUM 7.8*  --  7.6* 7.8*   Recent Labs    10/04/21 2241 10/06/21 1808 10/07/21 0611  INR 1.2 1.2 1.1   No results for input(s): "LABURIN" in the last 72 hours. Results for orders placed or performed during the hospital encounter of 10/04/21  Blood culture (routine x 2)     Status: Abnormal   Collection Time: 10/05/21  2:53 AM   Specimen: BLOOD  Result Value Ref Range Status   Specimen Description BLOOD RIGHT ARM  Final   Special Requests   Final    BOTTLES DRAWN AEROBIC AND ANAEROBIC Blood Culture results may not be optimal due to an inadequate volume of blood received in culture bottles   Culture  Setup Time   Final    GRAM NEGATIVE RODS IN BOTH AEROBIC AND ANAEROBIC BOTTLES CRITICAL VALUE NOTED.  VALUE IS CONSISTENT WITH PREVIOUSLY REPORTED AND CALLED VALUE.    Culture (A)  Final    ESCHERICHIA COLI SUSCEPTIBILITIES PERFORMED ON PREVIOUS CULTURE WITHIN THE LAST 5 DAYS. Performed at [facility], [place] [address], [place], [place] [address]    Report Status 10/07/2021 FINAL  Final  Blood culture (routine x 2)     Status: Abnormal   Collection Time: 10/05/21  2:53 AM   Specimen: BLOOD  Result Value Ref Range Status   Specimen Description BLOOD LEFT HAND  Final   Special Requests   Final    BOTTLES DRAWN AEROBIC AND ANAEROBIC Blood Culture results may not be optimal due to an inadequate volume of blood received in culture bottles   Culture  Setup Time   Final    GRAM NEGATIVE RODS IN BOTH AEROBIC AND ANAEROBIC BOTTLES CRITICAL RESULT CALLED TO, READ BACK BY AND VERIFIED WITH: PHARMD K.AMEND AT 1636 ON 10/05/2021 BY T.SAAD. Performed at [facility], [place] [address] [place], [place] [address]    Culture ESCHERICHIA COLI (A)  Final   Report Status 10/07/2021 FINAL  Final    Organism ID, Bacteria ESCHERICHIA COLI  Final      Susceptibility   Escherichia coli - MIC*    AMPICILLIN >=32 RESISTANT Resistant     CEFAZOLIN <=4 SENSITIVE Sensitive     CEFEPIME <=0.12 SENSITIVE Sensitive     CEFTAZIDIME <=1 SENSITIVE Sensitive  CEFTRIAXONE <=0.25 SENSITIVE Sensitive     CIPROFLOXACIN <=0.25 SENSITIVE Sensitive     GENTAMICIN <=1 SENSITIVE Sensitive     IMIPENEM <=0.25 SENSITIVE Sensitive     TRIMETH/SULFA <=20 SENSITIVE Sensitive     AMPICILLIN/SULBACTAM 16 INTERMEDIATE Intermediate     PIP/TAZO <=4 SENSITIVE Sensitive     * ESCHERICHIA COLI  SARS Coronavirus 2 by RT PCR (hospital order, performed in [place] hospital lab) *cepheid single result test* Anterior Nasal Swab     Status: None   Collection Time: 10/05/21  2:53 AM   Specimen: Anterior Nasal Swab  Result Value Ref Range Status   SARS Coronavirus 2 by RT PCR NEGATIVE NEGATIVE Final    Comment: (NOTE) SARS-CoV-2 target nucleic acids are NOT DETECTED.  The SARS-CoV-2 RNA is generally detectable in upper and lower respiratory specimens during the acute phase of infection. The lowest concentration of SARS-CoV-2 viral copies this assay can detect is 250 copies / mL. A negative result does not preclude SARS-CoV-2 infection and should not be used as the sole basis for treatment or other patient management decisions.  A negative result may occur with improper specimen collection / handling, submission of specimen other than nasopharyngeal swab, presence of viral mutation(s) within the areas targeted by this assay, and inadequate number of viral copies (<250 copies / mL). A negative result must be combined with clinical observations, patient history, and epidemiological information.  Fact Sheet for Patients:   https://www.patel.info/  Fact Sheet for Healthcare Providers: https://hall.com/  This test is not yet approved or  cleared by the [place] FDA  and has been authorized for detection and/or diagnosis of SARS-CoV-2 by FDA under an Emergency Use Authorization (EUA).  This EUA will remain in effect (meaning this test can be used) for the duration of the COVID-19 declaration under Section 564(b)(1) of the Act, 21 U.S.C. section 360bbb-3(b)(1), unless the authorization is terminated or revoked sooner.  Performed at [facility], [place] [address], [place], [place] [address]   Blood Culture ID Panel (Reflexed)     Status: Abnormal   Collection Time: 10/05/21  2:53 AM  Result Value Ref Range Status   Enterococcus faecalis NOT DETECTED NOT DETECTED Final   Enterococcus Faecium NOT DETECTED NOT DETECTED Final   Listeria monocytogenes NOT DETECTED NOT DETECTED Final   Staphylococcus species NOT DETECTED NOT DETECTED Final   Staphylococcus aureus (BCID) NOT DETECTED NOT DETECTED Final   Staphylococcus epidermidis NOT DETECTED NOT DETECTED Final   Staphylococcus lugdunensis NOT DETECTED NOT DETECTED Final   Streptococcus species NOT DETECTED NOT DETECTED Final   Streptococcus agalactiae NOT DETECTED NOT DETECTED Final   Streptococcus pneumoniae NOT DETECTED NOT DETECTED Final   Streptococcus pyogenes NOT DETECTED NOT DETECTED Final   A.calcoaceticus-baumannii NOT DETECTED NOT DETECTED Final   Bacteroides fragilis NOT DETECTED NOT DETECTED Final   Enterobacterales DETECTED (A) NOT DETECTED Final    Comment: Enterobacterales represent a large order of gram negative bacteria, not a single organism. CRITICAL RESULT CALLED TO, READ BACK BY AND VERIFIED WITH: PHARMD K.AMEND AT 1636 ON 10/05/2021 BY T.SAAD.    Enterobacter cloacae complex NOT DETECTED NOT DETECTED Final   Escherichia coli DETECTED (A) NOT DETECTED Final    Comment: CRITICAL RESULT CALLED TO, READ BACK BY AND VERIFIED WITH: PHARMD K.AMEND AT 1636 ON 10/05/2021 BY T.SAAD.    Klebsiella aerogenes NOT DETECTED NOT DETECTED Final   Klebsiella oxytoca NOT DETECTED NOT  DETECTED Final   Klebsiella pneumoniae NOT DETECTED NOT  DETECTED Final   Proteus species NOT DETECTED NOT DETECTED Final   Salmonella species NOT DETECTED NOT DETECTED Final   Serratia marcescens NOT DETECTED NOT DETECTED Final   Haemophilus influenzae NOT DETECTED NOT DETECTED Final   Neisseria meningitidis NOT DETECTED NOT DETECTED Final   Pseudomonas aeruginosa NOT DETECTED NOT DETECTED Final   Stenotrophomonas maltophilia NOT DETECTED NOT DETECTED Final   Candida albicans NOT DETECTED NOT DETECTED Final   Candida auris NOT DETECTED NOT DETECTED Final   Candida glabrata NOT DETECTED NOT DETECTED Final   Candida krusei NOT DETECTED NOT DETECTED Final   Candida parapsilosis NOT DETECTED NOT DETECTED Final   Candida tropicalis NOT DETECTED NOT DETECTED Final   Cryptococcus neoformans/gattii NOT DETECTED NOT DETECTED Final   CTX-M ESBL NOT DETECTED NOT DETECTED Final   Carbapenem resistance IMP NOT DETECTED NOT DETECTED Final   Carbapenem resistance KPC NOT DETECTED NOT DETECTED Final   Carbapenem resistance NDM NOT DETECTED NOT DETECTED Final   Carbapenem resist OXA 48 LIKE NOT DETECTED NOT DETECTED Final   Carbapenem resistance VIM NOT DETECTED NOT DETECTED Final    Comment: Performed at [facility], [address], [place], [place] [address]  Urine Culture     Status: Abnormal   Collection Time: 10/05/21  3:11 AM   Specimen: Urine, Clean Catch  Result Value Ref Range Status   Specimen Description URINE, CLEAN CATCH  Final   Special Requests   Final    NONE Performed at [facility], [place] [address], [place], [place] [address]    Culture >=100,000 COLONIES/mL ESCHERICHIA COLI (A)  Final   Report Status 10/07/2021 FINAL  Final   Organism ID, Bacteria ESCHERICHIA COLI (A)  Final      Susceptibility   Escherichia coli - MIC*    AMPICILLIN >=32 RESISTANT Resistant     CEFAZOLIN <=4 SENSITIVE Sensitive     CEFEPIME <=0.12 SENSITIVE Sensitive     CEFTRIAXONE <=0.25  SENSITIVE Sensitive     CIPROFLOXACIN <=0.25 SENSITIVE Sensitive     GENTAMICIN <=1 SENSITIVE Sensitive     IMIPENEM <=0.25 SENSITIVE Sensitive     NITROFURANTOIN <=16 SENSITIVE Sensitive     TRIMETH/SULFA <=20 SENSITIVE Sensitive     AMPICILLIN/SULBACTAM 16 INTERMEDIATE Intermediate     PIP/TAZO <=4 SENSITIVE Sensitive     * >=100,000 COLONIES/mL ESCHERICHIA COLI  MRSA Next Gen by PCR, Nasal     Status: None   Collection Time: 10/05/21  6:01 AM   Specimen: Nasal Mucosa; Nasal Swab  Result Value Ref Range Status   MRSA by PCR Next Gen NOT DETECTED NOT DETECTED Final    Comment: (NOTE) The GeneXpert MRSA Assay (FDA approved for NASAL specimens only), is one component of a comprehensive MRSA colonization surveillance program. It is not intended to diagnose MRSA infection nor to guide or monitor treatment for MRSA infections. Test performance is not FDA approved in patients less than 4 years old. Performed at [facility], [place] [address], [place], [place] [address]     Studies/Results: CT ABDOMEN PELVIS WO CONTRAST  Result Date: 10/07/2021 CLINICAL DATA:  Emphysematous pyelonephritis, follow-up exam EXAM: CT ABDOMEN AND PELVIS WITHOUT CONTRAST TECHNIQUE: Multidetector CT imaging of the abdomen and pelvis was performed following the standard protocol without IV contrast. RADIATION DOSE REDUCTION: This exam was performed according to the departmental dose-optimization program which includes automated exposure control, adjustment of the mA and/or kV according to patient size and/or use of iterative reconstruction technique.  COMPARISON:  10/05/2021 FINDINGS: Lower chest: Bilateral pleural effusions are noted right greater than left. Additionally there is considerable increase consolidation in the lower lobes bilaterally as well as the right middle lobe and left lingula. Hepatobiliary: Gallbladder is within normal limits. Cirrhotic change of the liver is again identified. Pancreas:  Unremarkable. No pancreatic ductal dilatation or surrounding inflammatory changes. Spleen: Surgically removed Adrenals/Urinary Tract: Adrenal glands are stable. Left kidney shows no calculi or obstructive changes. The right kidney again demonstrates subcapsular air collections which are smaller than that seen on the prior exam however there is now extension of air into the perirenal fat particularly medially. Again no obstructive changes are seen. Inflammatory changes extend along the right pericolic gutter. Bladder is decompressed by Foley catheter. Stomach/Bowel: Appendix is within normal limits. No obstructive or inflammatory changes of the colon are seen. Stomach and small bowel are within normal limits. Vascular/Lymphatic: Aortic atherosclerotic changes are noted. There are again seen lymph nodes in the region of the celiac axis, mesentery and periportal region scattered retroperitoneal nodes are again seen and stable. Reproductive: Status post hysterectomy. No adnexal masses. Other: New free fluid is noted within the pelvis likely reactive in nature. Musculoskeletal: No acute or significant osseous findings. IMPRESSION: Persistent findings of subcapsular gas in the right kidney although the collections appear smaller there has been some extension into the Perirenal fatty tissues which is new from the prior exam. Scattered lymph nodes as described similar to that seen on the prior exam. Significant increase in the degree of infiltrate with associated effusions in the bases bilaterally. Electronically Signed   By: Inez Catalina M.D.   On: 10/07/2021 01:49   DG CHEST PORT 1 VIEW  Result Date: 10/06/2021 CLINICAL DATA:  Shortness of breath EXAM: PORTABLE CHEST 1 VIEW COMPARISON:  10/06/2021, 10/04/2021 FINDINGS: Extensive left greater than right pulmonary airspace disease grossly unchanged as compared with radiograph earlier today. Probable right pleural effusion. Obscured cardiomediastinal silhouette. No  pneumothorax. IMPRESSION: 1. Grossly similar extensive left greater than right airspace disease which may be due to edema and or pneumonia 2. Suspect right pleural effusion Electronically Signed   By: Donavan Foil M.D.   On: 10/06/2021 22:39   ECHOCARDIOGRAM COMPLETE  Result Date: 10/06/2021    ECHOCARDIOGRAM REPORT   Patient Name:   [name] Date of Exam: 10/06/2021 Medical Rec #:  [id number]       Height:       62.5 in Accession #:    [id number]      Weight:       172.2 lb Date of Birth:  04-24-1965       BSA:          1.805 m Patient Age:    5 years        BP:           100/55 mmHg Patient Gender: F               HR:           83 bpm. Exam Location:  Inpatient Procedure: 2D Echo, Cardiac Doppler and Color Doppler Indications:    Cardiomyopathy  History:        Patient has prior history of Echocardiogram examinations, most                 recent 03/04/2014. CAD; Risk Factors:Diabetes and Hypertension.  Sonographer:    Jefferey Pica Referring Phys: [id number] [place]  1. Left ventricular ejection fraction, by  estimation, is 40 to 45%. The left ventricle has mildly decreased function. The left ventricle has no regional wall motion abnormalities. Left ventricular diastolic parameters were normal.  2. Right ventricular systolic function is normal. The right ventricular size is normal. There is normal pulmonary artery systolic pressure.  3. Left atrial size was mildly dilated.  4. Right atrial size was mildly dilated.  5. The mitral valve is normal in structure. Trivial mitral valve regurgitation. No evidence of mitral stenosis.  6. The aortic valve is tricuspid. There is mild calcification of the aortic valve. Aortic valve regurgitation is not visualized. Aortic valve sclerosis/calcification is present, without any evidence of aortic stenosis.  7. The inferior vena cava is normal in size with greater than 50% respiratory variability, suggesting right atrial pressure of 3 mmHg. FINDINGS   Left Ventricle: Left ventricular ejection fraction, by estimation, is 40 to 45%. The left ventricle has mildly decreased function. The left ventricle has no regional wall motion abnormalities. The left ventricular internal cavity size was normal in size. There is no left ventricular hypertrophy. Left ventricular diastolic parameters were normal. Right Ventricle: The right ventricular size is normal. No increase in right ventricular wall thickness. Right ventricular systolic function is normal. There is normal pulmonary artery systolic pressure. The tricuspid regurgitant velocity is 2.15 m/s, and  with an assumed right atrial pressure of 15 mmHg, the estimated right ventricular systolic pressure is 17.7 mmHg. Left Atrium: Left atrial size was mildly dilated. Right Atrium: Right atrial size was mildly dilated. Pericardium: There is no evidence of pericardial effusion. Mitral Valve: The mitral valve is normal in structure. Trivial mitral valve regurgitation. No evidence of mitral valve stenosis. Tricuspid Valve: The tricuspid valve is normal in structure. Tricuspid valve regurgitation is trivial. No evidence of tricuspid stenosis. Aortic Valve: The aortic valve is tricuspid. There is mild calcification of the aortic valve. Aortic valve regurgitation is not visualized. Aortic valve sclerosis/calcification is present, without any evidence of aortic stenosis. Aortic valve peak gradient measures 6.2 mmHg. Pulmonic Valve: The pulmonic valve was normal in structure. Pulmonic valve regurgitation is trivial. No evidence of pulmonic stenosis. Aorta: The aortic root is normal in size and structure. Venous: The inferior vena cava is normal in size with greater than 50% respiratory variability, suggesting right atrial pressure of 3 mmHg. IAS/Shunts: The interatrial septum appears to be lipomatous. No atrial level shunt detected by color flow Doppler.  LEFT VENTRICLE PLAX 2D LVIDd:         4.60 cm   Diastology LVIDs:         3.80  cm   LV e' medial:    6.66 cm/s LV PW:         1.00 cm   LV E/e' medial:  11.7 LV IVS:        1.10 cm   LV e' lateral:   9.25 cm/s LVOT diam:     2.00 cm   LV E/e' lateral: 8.4 LV SV:         75 LV SV Index:   41 LVOT Area:     3.14 cm  RIGHT VENTRICLE            IVC RV Basal diam:  3.00 cm    IVC diam: 2.50 cm RV S prime:     9.07 cm/s TAPSE (M-mode): 2.2 cm LEFT ATRIUM             Index        RIGHT ATRIUM  Index LA diam:        3.50 cm 1.94 cm/m   RA Area:     17.60 cm LA Vol (A2C):   45.5 ml 25.21 ml/m  RA Volume:   53.20 ml  29.48 ml/m LA Vol (A4C):   48.0 ml 26.60 ml/m LA Biplane Vol: 47.3 ml 26.21 ml/m  AORTIC VALVE                 PULMONIC VALVE AV Area (Vmax): 2.76 cm     PV Vmax:       0.59 m/s AV Vmax:        124.00 cm/s  PV Peak grad:  1.4 mmHg AV Peak Grad:   6.2 mmHg LVOT Vmax:      109.00 cm/s LVOT Vmean:     73.000 cm/s LVOT VTI:       0.238 m  AORTA Ao Root diam: 3.10 cm Ao Asc diam:  3.20 cm MITRAL VALVE               TRICUSPID VALVE MV Area (PHT): 5.75 cm    TR Peak grad:   18.5 mmHg MV Decel Time: 132 msec    TR Vmax:        215.00 cm/s MV E velocity: 78.10 cm/s MV A velocity: 56.30 cm/s  SHUNTS MV E/A ratio:  1.39        Systemic VTI:  0.24 m                            Systemic Diam: 2.00 cm Glori Bickers MD Electronically signed by Glori Bickers MD Signature Date/Time: 10/06/2021/3:52:31 PM    Final    DG CHEST PORT 1 VIEW  Result Date: 10/06/2021 CLINICAL DATA:  Dyspnea, emphysematous pyelonephritis EXAM: PORTABLE CHEST 1 VIEW COMPARISON:  10/04/2021 FINDINGS: Transverse diameter of heart is increased. Diffuse alveolar densities seen in the both lungs, more so on the left side. There is blunting of both lateral CP angles. There is no pneumothorax. IMPRESSION: Diffuse increase in alveolar densities seen in both lungs, more so on the left side suggesting pulmonary edema. Possibility of underlying pneumonia is not excluded. Small bilateral pleural effusions.  Electronically Signed   By: Elmer Picker M.D.   On: 10/06/2021 15:06    CT scan personally reviewed and is detailed above  Assessment: Right emphysematous pyelonephritis Sepsis secondary to E. coli UTI/emphysematous pyelonephritis  Plan: Agree with IV antibiotics and aggressive medical management.  Fortunately, subcapsular gas collections are smaller than prior.  Patient does not require pressors and is currently stable.  Continues with thrombocytopenia.  Risk of morbidity with surgical intervention with her multiple medical comorbidities and current status much higher than medical management alone.  I do not think there is a role for percutaneous tube placement at this time especially given the subcapsular gas improvement.   Link Snuffer, MD Urology 10/07/2021, 7:35 PM

## 2021-10-07 NOTE — Progress Notes (Signed)
NAME:  [name], MRN:  [id number], DOB:  April 17, 1966, LOS: 2 ADMISSION DATE:  10/04/2021, CONSULTATION DATE:  10/05/21 REFERRING MD:  Dr Myna Hidalgo, CHIEF COMPLAINT:  Abd pain   History of Present Illness:  56 year old woman with history of metabolic syndrome with NO6/ CAD/ ischemic cardiomyopathy/NASH, lipemic spleen s/p splenectomy who presents with 2 days of diffuse myalgias, chills, N/V and abdominal pain. Workup in ER reveals emphysematous pyelonephritis with concurrent acute renal failure, thrombocytopenia. Also new diagnosis of cirrhosis.  Urology to be called by [facility].   PCCM asked to consult for hypotension refractory to fluids.  Pertinent  Medical History   Past Medical History:  Diagnosis Date   Anxiety    CAD (coronary artery disease)    a.  Inferior STEMI / CAD s/p DES to prox RCA 03/03/14, staged cath 03/05/14 for LAD showing significantly improved stenosis of mid LAD with resolution of prior thrombus.   Clostridium difficile colitis    Diabetes mellitus    Dyslipidemia    Fibromyalgia    GERD (gastroesophageal reflux disease)    H/O hiatal hernia    Ischemic cardiomyopathy    a. EF 45-50% by echo 03/04/14 with suspected mild acute diastolic CHF s/p dose of IV Lasix.   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 s/p splenectomy.   Sinus bradycardia    a. During 02/2014 admit, not on BB due to this.   Upper respiratory infection 2/15   states has dry non productive cough since then- "tickle in my throat" no fever    Significant Hospital Events: Including procedures, antibiotic start and stop dates in addition to other pertinent events   Admit 6/18  Interim History / Subjective:  Overnight, patient off vasopressors, increasing O2 demand, bipap started  Tmax 98.1, +3.1 L admission, -1L past 24 hours, 3.2L UOP  Subjective: Complaints of general abdominal pain, some SOB,  denies chest pain  Objective   Blood pressure 103/63, pulse 72, temperature (!) 97.1 F (36.2 C), temperature source Axillary, resp. rate 15, height 5' 2.5" (1.588 m), weight 78.1 kg, SpO2 97 %.    FiO2 (%):  [70 %] 70 %   Intake/Output Summary (Last 24 hours) at 10/07/2021 0759 Last data filed at 10/07/2021 0600 Gross per 24 hour  Intake 2192.34 ml  Output 3274 ml  Net -1081.66 ml   Filed Weights   10/06/21 0730 10/07/21 0500  Weight: 78.1 kg 78.1 kg    Examination: General: In bed, NAD, appears comfortable HEENT: MM pink/moist, anicteric, atraumatic Neuro: RASS 0, PERRL 6m, Thumbs up and toe wiggles to command, moves all extremities  CV: S1S2, NSR, no m/r/g appreciated PULM: air movement in all lobes, trachea midline, chest expansion symmetric GI: soft, bsx4 active, non-tender   Extremities: warm/dry, no pretibial edema, capillary refill less than 3 seconds  Skin:  no rashes or lesions noted  BG 162-300 K 3.9>3.0 Creat 2.12>1.77>1.47, BUN 80>74>66 HGB 10.6>10.5 PLT 7>22 INR 1.2>1.1 Ddimer greater than 20 CK 23 7.4/25/177/22.2  CT abd: Per radiology persistent findings of subcapsular gas in right kidney, collections appear smaller, no extension into the perirenal fatty tissues, bilateral effusions greater right than left, lower lobe consolidation  Echo: LVEF 40 to 45%, RVSF normal  Resolved Hospital Problem list   Lactic acidosis AGMA  Assessment & Plan:   Septic shock secondary to right emphysematous pyelonephritis  E  Coli per Blood Cultures drawn 6/18 Now off pressors, Echo: LVEF 40 to 45%, RVSF normal  -Continue midodrine 10 mg, 3 times daily -Continue Rocephin -Trend fever WBC curve -CT abdomen completed overnight, appreciate urology input regarding right kidney.  Patient remains n.p.o..  Possible percutaneous tube placement today.  Acute respiratory failure with hypoxia, patient with increasing oxygen requirement overnight, thought to be due to to volume  status, documented SPO2 of 83% past 24 hours, now on BiPAP, 70% FIo2. Bilateral pleural effusions, as seen on CT abdomen on 6/20 -Diuresis with Lasix 40 mg x 1 today -Continue BiPAP -Pulmonary toilet as able -A.m. chest x-ray -Goal SPO2 92-97%, wean O2 to goal  Acute kidney injury, secondary to shock/severe sepsis Creat 2.12>1.77>1.47, BUN 80>74>66 -Ensure renal perfusion. Goal MAP 65 or greater. -Avoid neprotoxic drugs as possible. -Strict I&O's -Follow up AM creatinine  NASH cirrhosis with evidence of portal hypertension on imaging -Continue to follow LFTs -Avoid chronic Tylenol use   Ischemic cardiomyopathy Echo: LVEF 40 to 45%, RVSF normal  -Holding ASA 81 -Continuous telemetry  Immunocompromised with hx of splenectomy -Continue supportive care -Continue ceftriaxone -Trend fever curve and WBC  Thrombocytopenia, acute vs chronic- PLT 7>22, unlikely sequestration in setting of splenectomy, more likely septic response -Continue to follow CBC -Platelet transfusion for platelets less than 10 -Monitor for bleeding   DM2 with hyperglycemia Overnight hypoglycemic 6/19 BG 162-300 -Stopping D10 infusion -Continue sliding scale -Diabetes consult for assistance with Levemir dosing  Pain -As needed Tylenol  Best Practice (right click and "Reselect all SmartList Selections" daily)   Diet/type: NPO DVT prophylaxis: SCD GI prophylaxis: PPI Lines: N/A Foley:  Yes, and it is still needed Code Status:  full code Last date of multidisciplinary goals of care discussion [pending]  Critical care time: 37 min     Redmond School., MSN, APRN, AGACNP-BC [facility]  10/07/2021 , 7:59 AM  Please see [email or web address] for pager details  If no response, please call [phone] After hours, please call Elink at [phone]

## 2021-10-07 NOTE — Progress Notes (Signed)
Inpatient Diabetes Program Recommendations  AACE/ADA: New Consensus Statement on Inpatient Glycemic Control (2015)  Target Ranges:  Prepandial:   less than 140 mg/dL      Peak postprandial:   less than 180 mg/dL (1-2 hours)      Critically ill patients:  140 - 180 mg/dL   Lab Results  Component Value Date   GLUCAP 232 (H) 10/07/2021   HGBA1C 12.5 (H) 10/05/2021    Review of Glycemic Control  Latest Reference Range & Units 10/06/21 19:43 10/06/21 21:54 10/06/21 23:36 10/07/21 03:46 10/07/21 07:49  Glucose-Capillary 70 - 99 mg/dL 269 (H) 269 (H) 259 (H) 247 (H) 232 (H)   Diabetes history: DM 2 Outpatient Diabetes medications:  Amaryl 4 mg daily Basaglar 30 units q HS Metformin 1000 mg bid Current orders for Inpatient glycemic control:  Novolog 0-6 units q 4 hours  Inpatient Diabetes Program Recommendations:    Note patient had low blood sugars.  D10 is being stopped today.  Blood sugars now >200 mg/dL.  Please consider adding Levemir 5 units bid.  Will follow.   Thanks,  Adah Perl, RN, BC-ADM Inpatient Diabetes Coordinator Pager [phone]  (8a-5p)

## 2021-10-07 NOTE — Progress Notes (Signed)
Reviewed CT.  Looks like gas around the kidney is improved from 2 days ago.  Do not think there is anything to place a drain in.  Continue current management.  Okay to eat from my standpoint.

## 2021-10-07 NOTE — Progress Notes (Signed)
This RN entered patient room after hearing the BIPAP disconnection alarm going off. This RN found patients BIPAP mask off. Visitor states that he had to take it off of her due to patient suffocating and not being able to breath. Patient and visitor were educated on the importance of keeping the BIPAP mask on.This RN discussed with the patient that we would be getting CXR at bedside. When turning to leave the room, patients visitor proceeded to raise his fist and flip the middle finger at this RN. Visitor then left the unit.   Antonieta Pert, RN  10/06/2021 2230

## 2021-10-08 DIAGNOSIS — A419 Sepsis, unspecified organism: Secondary | ICD-10-CM | POA: Diagnosis not present

## 2021-10-08 DIAGNOSIS — B9620 Unspecified Escherichia coli [E. coli] as the cause of diseases classified elsewhere: Secondary | ICD-10-CM

## 2021-10-08 DIAGNOSIS — R6521 Severe sepsis with septic shock: Secondary | ICD-10-CM | POA: Diagnosis not present

## 2021-10-08 DIAGNOSIS — R7881 Bacteremia: Secondary | ICD-10-CM

## 2021-10-08 DIAGNOSIS — N12 Tubulo-interstitial nephritis, not specified as acute or chronic: Secondary | ICD-10-CM | POA: Diagnosis not present

## 2021-10-08 DIAGNOSIS — K7469 Other cirrhosis of liver: Secondary | ICD-10-CM | POA: Diagnosis not present

## 2021-10-08 LAB — GLUCOSE, CAPILLARY
Glucose-Capillary: 177 mg/dL — ABNORMAL HIGH (ref 70–99)
Glucose-Capillary: 225 mg/dL — ABNORMAL HIGH (ref 70–99)
Glucose-Capillary: 228 mg/dL — ABNORMAL HIGH (ref 70–99)
Glucose-Capillary: 234 mg/dL — ABNORMAL HIGH (ref 70–99)
Glucose-Capillary: 245 mg/dL — ABNORMAL HIGH (ref 70–99)
Glucose-Capillary: 282 mg/dL — ABNORMAL HIGH (ref 70–99)

## 2021-10-08 LAB — CBC WITH DIFFERENTIAL/PLATELET
Abs Immature Granulocytes: 0.41 10*3/uL — ABNORMAL HIGH (ref 0.00–0.07)
Basophils Absolute: 0 10*3/uL (ref 0.0–0.1)
Basophils Relative: 0 %
Eosinophils Absolute: 0.1 10*3/uL (ref 0.0–0.5)
Eosinophils Relative: 1 %
HCT: 28.2 % — ABNORMAL LOW (ref 36.0–46.0)
Hemoglobin: 10.1 g/dL — ABNORMAL LOW (ref 12.0–15.0)
Immature Granulocytes: 4 %
Lymphocytes Relative: 10 %
Lymphs Abs: 1 10*3/uL (ref 0.7–4.0)
MCH: 32.6 pg (ref 26.0–34.0)
MCHC: 35.8 g/dL (ref 30.0–36.0)
MCV: 91 fL (ref 80.0–100.0)
Monocytes Absolute: 0.3 10*3/uL (ref 0.1–1.0)
Monocytes Relative: 3 %
Neutro Abs: 8.4 10*3/uL — ABNORMAL HIGH (ref 1.7–7.7)
Neutrophils Relative %: 82 %
Platelets: 16 10*3/uL — CL (ref 150–400)
RBC: 3.1 MIL/uL — ABNORMAL LOW (ref 3.87–5.11)
RDW: 14.7 % (ref 11.5–15.5)
WBC: 10.3 10*3/uL (ref 4.0–10.5)
nRBC: 0.2 % (ref 0.0–0.2)

## 2021-10-08 LAB — BASIC METABOLIC PANEL
Anion gap: 13 (ref 5–15)
Anion gap: 13 (ref 5–15)
BUN: 57 mg/dL — ABNORMAL HIGH (ref 6–20)
BUN: 55 mg/dL — ABNORMAL HIGH (ref 6–20)
CO2: 23 mmol/L (ref 22–32)
CO2: 23 mmol/L (ref 22–32)
Calcium: 8.4 mg/dL — ABNORMAL LOW (ref 8.9–10.3)
Calcium: 8.4 mg/dL — ABNORMAL LOW (ref 8.9–10.3)
Chloride: 101 mmol/L (ref 98–111)
Chloride: 100 mmol/L (ref 98–111)
Creatinine, Ser: 1.15 mg/dL — ABNORMAL HIGH (ref 0.44–1.00)
Creatinine, Ser: 1.14 mg/dL — ABNORMAL HIGH (ref 0.44–1.00)
GFR, Estimated: 56 mL/min — ABNORMAL LOW (ref >60)
GFR, Estimated: 56 mL/min — ABNORMAL LOW (ref >60)
Glucose, Bld: 234 mg/dL — ABNORMAL HIGH (ref 70–99)
Glucose, Bld: 301 mg/dL — ABNORMAL HIGH (ref 70–99)
Potassium: 3.5 mmol/L (ref 3.5–5.1)
Potassium: 4 mmol/L (ref 3.5–5.1)
Sodium: 137 mmol/L (ref 135–145)
Sodium: 136 mmol/L (ref 135–145)

## 2021-10-08 LAB — CBC
HCT: 30.5 % — ABNORMAL LOW (ref 36.0–46.0)
Hemoglobin: 11.3 g/dL — ABNORMAL LOW (ref 12.0–15.0)
MCH: 33.5 pg (ref 26.0–34.0)
MCHC: 37 g/dL — ABNORMAL HIGH (ref 30.0–36.0)
MCV: 90.5 fL (ref 80.0–100.0)
Platelets: 15 10*3/uL — CL (ref 150–400)
RBC: 3.37 MIL/uL — ABNORMAL LOW (ref 3.87–5.11)
RDW: 14.6 % (ref 11.5–15.5)
WBC: 10.1 10*3/uL (ref 4.0–10.5)
nRBC: 0.2 % (ref 0.0–0.2)

## 2021-10-08 LAB — MAGNESIUM: Magnesium: 1.8 mg/dL (ref 1.7–2.4)

## 2021-10-08 LAB — SAVE SMEAR(SSMR), FOR PROVIDER SLIDE REVIEW

## 2021-10-08 LAB — LACTATE DEHYDROGENASE: LDH: 228 U/L — ABNORMAL HIGH (ref 98–192)

## 2021-10-08 MED ORDER — SODIUM CHLORIDE 0.9 % IV SOLN
2.0000 g | INTRAVENOUS | Status: DC
  Administered 2021-10-08 – 2021-10-27 (×20): 2 g via INTRAVENOUS
  Filled 2021-10-08 (×22): qty 20

## 2021-10-08 MED ORDER — FUROSEMIDE 10 MG/ML IJ SOLN
40.0000 mg | Freq: Once | INTRAMUSCULAR | Status: AC
Start: 2021-10-08 — End: 2021-10-08
  Administered 2021-10-08: 40 mg via INTRAVENOUS
  Filled 2021-10-08: qty 4

## 2021-10-08 MED ORDER — SENNOSIDES-DOCUSATE SODIUM 8.6-50 MG PO TABS
1.0000 | ORAL_TABLET | Freq: Two times a day (BID) | ORAL | Status: DC
  Administered 2021-10-11 – 2021-10-27 (×20): 1 via ORAL
  Filled 2021-10-08 (×33): qty 1

## 2021-10-08 MED ORDER — MAGNESIUM SULFATE 2 GM/50ML IV SOLN
2.0000 g | Freq: Once | INTRAVENOUS | Status: AC
Start: 2021-10-08 — End: 2021-10-08
  Administered 2021-10-08: 2 g via INTRAVENOUS
  Filled 2021-10-08: qty 50

## 2021-10-08 MED ORDER — DULOXETINE HCL 60 MG PO CPEP
60.0000 mg | ORAL_CAPSULE | Freq: Every day | ORAL | Status: DC
  Administered 2021-10-08 – 2021-10-27 (×20): 60 mg via ORAL
  Filled 2021-10-08 (×18): qty 1
  Filled 2021-10-08: qty 2
  Filled 2021-10-08: qty 1

## 2021-10-08 MED ORDER — POLYETHYLENE GLYCOL 3350 17 G PO PACK
17.0000 g | PACK | Freq: Two times a day (BID) | ORAL | Status: DC
  Administered 2021-10-11 – 2021-10-19 (×14): 17 g via ORAL
  Filled 2021-10-08 (×24): qty 1

## 2021-10-08 MED ORDER — INSULIN GLARGINE-YFGN 100 UNIT/ML ~~LOC~~ SOLN
10.0000 [IU] | Freq: Once | SUBCUTANEOUS | Status: AC
Start: 2021-10-08 — End: 2021-10-08
  Administered 2021-10-08: 10 [IU] via SUBCUTANEOUS
  Filled 2021-10-08: qty 0.1

## 2021-10-08 MED ORDER — INSULIN GLARGINE-YFGN 100 UNIT/ML ~~LOC~~ SOLN
15.0000 [IU] | Freq: Two times a day (BID) | SUBCUTANEOUS | Status: DC
  Administered 2021-10-08 – 2021-10-14 (×13): 15 [IU] via SUBCUTANEOUS
  Filled 2021-10-08 (×17): qty 0.15

## 2021-10-08 MED ORDER — POTASSIUM CHLORIDE CRYS ER 20 MEQ PO TBCR
20.0000 meq | EXTENDED_RELEASE_TABLET | Freq: Once | ORAL | Status: AC
Start: 2021-10-08 — End: 2021-10-08
  Administered 2021-10-08: 20 meq via ORAL
  Filled 2021-10-08: qty 1

## 2021-10-08 NOTE — Progress Notes (Signed)
Inpatient Diabetes Program Recommendations  AACE/ADA: New Consensus Statement on Inpatient Glycemic Control (2015)  Target Ranges:  Prepandial:   less than 140 mg/dL      Peak postprandial:   less than 180 mg/dL (1-2 hours)      Critically ill patients:  140 - 180 mg/dL   Lab Results  Component Value Date   GLUCAP 234 (H) 10/08/2021   HGBA1C 12.5 (H) 10/05/2021    Review of Glycemic Control  Latest Reference Range & Units 10/07/21 19:31 10/07/21 23:24 10/08/21 03:29 10/08/21 07:54 10/08/21 12:39  Glucose-Capillary 70 - 99 mg/dL 285 (H) 260 (H) 225 (H) 228 (H) 234 (H)   Diabetes history: DM 2 Outpatient Diabetes medications:  Amaryl 4 mg daily Basaglar 30 units q HS Metformin 1000 mg bid Current orders for Inpatient glycemic control:  Novolog 0-6 units q 4 hours Semglee 15 units bid Inpatient Diabetes Program Recommendations:    Spoke with patient at bedside regarding A1C.  She states that she was just recently started on insulin by MD at the [facility].  She takes Basaglar 30 units once a day.  She states that her goal for her blood sugars is 160 mg/dL. We discussed low blood sugar symptoms and treatment as well.  She states that she feels low when she is less than 150 mg/dL.  Discussed importance of glucose control and goal A1C of 150 mg/dL.  Patient verbalized understanding of information. She feels comfortable administering insulin and has meter at home.    Thanks,  Adah Perl, RN, BC-ADM Inpatient Diabetes Coordinator Pager [phone]  (8a-5p)

## 2021-10-08 NOTE — Progress Notes (Signed)
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  Patient remains weak.  However, overall feeling a little bit better.  No confusion.  Off pressors.  Creatinine 1.14.  Continuing to have some hyperglycemia.  Platelets 16, white blood cell count 10.3.  Infectious disease consulted, antibiotics changed back to ceftriaxone.  Respiratory failure improved with diuresis.  Principal Problem:   Emphysematous pyelitis Active Problems:   Insulin-requiring or dependent type II diabetes mellitus (HCC)   CAD (coronary artery disease)   Hypotension   Thrombocytopenia (HCC)   AKI (acute kidney injury) ([place])   Elevated troponin   Acidosis   Cirrhosis ([place])   Septic shock (HCC)   Acute respiratory failure with hypoxia ([place])   E coli bacteremia  Current Facility-Administered Medications  Medication Dose Route Frequency Provider Last Rate Last Admin   0.9 %  sodium chloride infusion (Manually program via Guardrails IV Fluids)   Intravenous Once Candee Furbish, MD   Held at 10/05/21 0517   0.9 %  sodium chloride infusion  250 mL Intravenous Continuous Candee Furbish, MD   Held at 10/05/21 [phone]   acetaminophen (TYLENOL) tablet 650 mg  650 mg Oral Q6H PRN Vianne Bulls, MD   650 mg at 10/08/21 1038   Or   acetaminophen (TYLENOL) suppository 650 mg  650 mg Rectal Q6H PRN Opyd, Ilene Qua, MD       cefTRIAXone (ROCEPHIN) 2 g in sodium chloride 0.9 % 100 mL IVPB  2 g Intravenous Q24H Vu, Trung T, MD 200 mL/hr at 10/08/21 1718 2 g at 10/08/21 1718   Chlorhexidine Gluconate Cloth 2 % PADS 6 each  6 each Topical Daily Candee Furbish, MD   6 each at 10/08/21 1600   DULoxetine (CYMBALTA) DR capsule 60 mg  60  mg Oral QHS Estill Cotta, NP       insulin aspart (novoLOG) injection 0-6 Units  0-6 Units Subcutaneous Q4H Opyd, Ilene Qua, MD   3 Units at 10/08/21 1637   insulin glargine-yfgn (SEMGLEE) injection 15 Units  15 Units Subcutaneous BID Estill Cotta, NP       lip balm (CARMEX) ointment   Topical PRN Collene Gobble, MD       ondansetron (ZOFRAN) tablet 4 mg  4 mg Oral Q6H PRN Opyd, Ilene Qua, MD       Or   ondansetron (ZOFRAN) injection 4 mg  4 mg Intravenous Q6H PRN Opyd, Ilene Qua, MD       Oral care mouth rinse  15 mL Mouth Rinse PRN Margaretha Seeds, MD       pantoprazole (PROTONIX) EC tablet 40 mg  40 mg Oral Daily Candee Furbish, MD   40 mg at 10/08/21 0934   polyethylene glycol (MIRALAX / GLYCOLAX) packet 17 g  17 g Oral BID Estill Cotta, NP       senna-docusate (Senokot-S) tablet 1 tablet  1 tablet Oral BID Estill Cotta, NP       sodium chloride flush (NS) 0.9 % injection 3 mL  3 mL Intravenous Q12H Opyd, Ilene Qua, MD   3 mL at 10/08/21 0939   traMADol (ULTRAM) tablet 50 mg  50 mg Oral Q12H PRN Mauri Brooklyn, MD  50 mg at 10/08/21 0033     Objective: Vital: Vitals:   10/08/21 1600 10/08/21 1630 10/08/21 1700 10/08/21 1730  BP:   115/64 116/66  Pulse: 60 71 65 67  Resp: '18 18 20 19  '$ Temp:      TempSrc:      SpO2: 94% 95% 96% 95%  Weight:      Height:       I/Os: I/O last 3 completed shifts: In: 1519.3 [P.O.:633; I.V.:307.5; IV Piggyback:578.8] Out: 6200 [Urine:6200]  Physical Exam:  General: Patient is in no apparent distress, appears ill Lungs: Normal respiratory effort, chest expands symmetrically. GI: The abdomen is diffusely tender without mass.  Very sensitive to touch Foley: Draining clear yellow urine Ext: lower extremities symmetric  Lab Results: Recent Labs    10/07/21 0611 10/08/21 0200 10/08/21 1432  WBC 8.9 10.1 10.3  HGB 10.5* 11.3* 10.1*  HCT 28.7* 30.5* 28.2*   Recent Labs    10/07/21 1225 10/08/21 0200 10/08/21 1432   NA 136 137 136  K 3.6 3.5 4.0  CL 101 101 100  CO2 '23 23 23  '$ GLUCOSE 258* 234* 301*  BUN 65* 57* 55*  CREATININE 1.49* 1.15* 1.14*  CALCIUM 7.8* 8.4* 8.4*   Recent Labs    10/06/21 1808 10/07/21 0611  INR 1.2 1.1   No results for input(s): "LABURIN" in the last 72 hours. Results for orders placed or performed during the hospital encounter of 10/04/21  Blood culture (routine x 2)     Status: Abnormal   Collection Time: 10/05/21  2:53 AM   Specimen: BLOOD  Result Value Ref Range Status   Specimen Description BLOOD RIGHT ARM  Final   Special Requests   Final    BOTTLES DRAWN AEROBIC AND ANAEROBIC Blood Culture results may not be optimal due to an inadequate volume of blood received in culture bottles   Culture  Setup Time   Final    GRAM NEGATIVE RODS IN BOTH AEROBIC AND ANAEROBIC BOTTLES CRITICAL VALUE NOTED.  VALUE IS CONSISTENT WITH PREVIOUSLY REPORTED AND CALLED VALUE.    Culture (A)  Final    ESCHERICHIA COLI SUSCEPTIBILITIES PERFORMED ON PREVIOUS CULTURE WITHIN THE LAST 5 DAYS. Performed at [facility], [place] [address], [place], [place] [address]    Report Status 10/07/2021 FINAL  Final  Blood culture (routine x 2)     Status: Abnormal   Collection Time: 10/05/21  2:53 AM   Specimen: BLOOD  Result Value Ref Range Status   Specimen Description BLOOD LEFT HAND  Final   Special Requests   Final    BOTTLES DRAWN AEROBIC AND ANAEROBIC Blood Culture results may not be optimal due to an inadequate volume of blood received in culture bottles   Culture  Setup Time   Final    GRAM NEGATIVE RODS IN BOTH AEROBIC AND ANAEROBIC BOTTLES CRITICAL RESULT CALLED TO, READ BACK BY AND VERIFIED WITH: PHARMD K.AMEND AT 1636 ON 10/05/2021 BY T.SAAD. Performed at [facility], [place] [address], [place], [place] [address]    Culture ESCHERICHIA COLI (A)  Final   Report Status 10/07/2021 FINAL  Final   Organism ID, Bacteria ESCHERICHIA COLI  Final      Susceptibility    Escherichia coli - MIC*    AMPICILLIN >=32 RESISTANT Resistant     CEFAZOLIN <=4 SENSITIVE Sensitive     CEFEPIME <=0.12 SENSITIVE Sensitive     CEFTAZIDIME <=1 SENSITIVE Sensitive     CEFTRIAXONE <=  0.25 SENSITIVE Sensitive     CIPROFLOXACIN <=0.25 SENSITIVE Sensitive     GENTAMICIN <=1 SENSITIVE Sensitive     IMIPENEM <=0.25 SENSITIVE Sensitive     TRIMETH/SULFA <=20 SENSITIVE Sensitive     AMPICILLIN/SULBACTAM 16 INTERMEDIATE Intermediate     PIP/TAZO <=4 SENSITIVE Sensitive     * ESCHERICHIA COLI  SARS Coronavirus 2 by RT PCR (hospital order, performed in [place] hospital lab) *cepheid single result test* Anterior Nasal Swab     Status: None   Collection Time: 10/05/21  2:53 AM   Specimen: Anterior Nasal Swab  Result Value Ref Range Status   SARS Coronavirus 2 by RT PCR NEGATIVE NEGATIVE Final    Comment: (NOTE) SARS-CoV-2 target nucleic acids are NOT DETECTED.  The SARS-CoV-2 RNA is generally detectable in upper and lower respiratory specimens during the acute phase of infection. The lowest concentration of SARS-CoV-2 viral copies this assay can detect is 250 copies / mL. A negative result does not preclude SARS-CoV-2 infection and should not be used as the sole basis for treatment or other patient management decisions.  A negative result may occur with improper specimen collection / handling, submission of specimen other than nasopharyngeal swab, presence of viral mutation(s) within the areas targeted by this assay, and inadequate number of viral copies (<250 copies / mL). A negative result must be combined with clinical observations, patient history, and epidemiological information.  Fact Sheet for Patients:   https://www.patel.info/  Fact Sheet for Healthcare Providers: https://hall.com/  This test is not yet approved or  cleared by the [place] FDA and has been authorized for detection and/or diagnosis of SARS-CoV-2  by FDA under an Emergency Use Authorization (EUA).  This EUA will remain in effect (meaning this test can be used) for the duration of the COVID-19 declaration under Section 564(b)(1) of the Act, 21 U.S.C. section 360bbb-3(b)(1), unless the authorization is terminated or revoked sooner.  Performed at [facility], [place] [address], [place], [place] [address]   Blood Culture ID Panel (Reflexed)     Status: Abnormal   Collection Time: 10/05/21  2:53 AM  Result Value Ref Range Status   Enterococcus faecalis NOT DETECTED NOT DETECTED Final   Enterococcus Faecium NOT DETECTED NOT DETECTED Final   Listeria monocytogenes NOT DETECTED NOT DETECTED Final   Staphylococcus species NOT DETECTED NOT DETECTED Final   Staphylococcus aureus (BCID) NOT DETECTED NOT DETECTED Final   Staphylococcus epidermidis NOT DETECTED NOT DETECTED Final   Staphylococcus lugdunensis NOT DETECTED NOT DETECTED Final   Streptococcus species NOT DETECTED NOT DETECTED Final   Streptococcus agalactiae NOT DETECTED NOT DETECTED Final   Streptococcus pneumoniae NOT DETECTED NOT DETECTED Final   Streptococcus pyogenes NOT DETECTED NOT DETECTED Final   A.calcoaceticus-baumannii NOT DETECTED NOT DETECTED Final   Bacteroides fragilis NOT DETECTED NOT DETECTED Final   Enterobacterales DETECTED (A) NOT DETECTED Final    Comment: Enterobacterales represent a large order of gram negative bacteria, not a single organism. CRITICAL RESULT CALLED TO, READ BACK BY AND VERIFIED WITH: PHARMD K.AMEND AT 1636 ON 10/05/2021 BY T.SAAD.    Enterobacter cloacae complex NOT DETECTED NOT DETECTED Final   Escherichia coli DETECTED (A) NOT DETECTED Final    Comment: CRITICAL RESULT CALLED TO, READ BACK BY AND VERIFIED WITH: PHARMD K.AMEND AT 1636 ON 10/05/2021 BY T.SAAD.    Klebsiella aerogenes NOT DETECTED NOT DETECTED Final   Klebsiella oxytoca NOT DETECTED NOT DETECTED Final   Klebsiella pneumoniae NOT DETECTED NOT DETECTED Final  Proteus species NOT DETECTED NOT DETECTED Final   Salmonella species NOT DETECTED NOT DETECTED Final   Serratia marcescens NOT DETECTED NOT DETECTED Final   Haemophilus influenzae NOT DETECTED NOT DETECTED Final   Neisseria meningitidis NOT DETECTED NOT DETECTED Final   Pseudomonas aeruginosa NOT DETECTED NOT DETECTED Final   Stenotrophomonas maltophilia NOT DETECTED NOT DETECTED Final   Candida albicans NOT DETECTED NOT DETECTED Final   Candida auris NOT DETECTED NOT DETECTED Final   Candida glabrata NOT DETECTED NOT DETECTED Final   Candida krusei NOT DETECTED NOT DETECTED Final   Candida parapsilosis NOT DETECTED NOT DETECTED Final   Candida tropicalis NOT DETECTED NOT DETECTED Final   Cryptococcus neoformans/gattii NOT DETECTED NOT DETECTED Final   CTX-M ESBL NOT DETECTED NOT DETECTED Final   Carbapenem resistance IMP NOT DETECTED NOT DETECTED Final   Carbapenem resistance KPC NOT DETECTED NOT DETECTED Final   Carbapenem resistance NDM NOT DETECTED NOT DETECTED Final   Carbapenem resist OXA 48 LIKE NOT DETECTED NOT DETECTED Final   Carbapenem resistance VIM NOT DETECTED NOT DETECTED Final    Comment: Performed at [facility], [address] [place], [place] [address]  Urine Culture     Status: Abnormal   Collection Time: 10/05/21  3:11 AM   Specimen: Urine, Clean Catch  Result Value Ref Range Status   Specimen Description URINE, CLEAN CATCH  Final   Special Requests   Final    NONE Performed at [facility], [place] [address], [place], [place] [address]    Culture >=100,000 COLONIES/mL ESCHERICHIA COLI (A)  Final   Report Status 10/07/2021 FINAL  Final   Organism ID, Bacteria ESCHERICHIA COLI (A)  Final      Susceptibility   Escherichia coli - MIC*    AMPICILLIN >=32 RESISTANT Resistant     CEFAZOLIN <=4 SENSITIVE Sensitive     CEFEPIME <=0.12 SENSITIVE Sensitive     CEFTRIAXONE <=0.25 SENSITIVE Sensitive     CIPROFLOXACIN <=0.25 SENSITIVE Sensitive      GENTAMICIN <=1 SENSITIVE Sensitive     IMIPENEM <=0.25 SENSITIVE Sensitive     NITROFURANTOIN <=16 SENSITIVE Sensitive     TRIMETH/SULFA <=20 SENSITIVE Sensitive     AMPICILLIN/SULBACTAM 16 INTERMEDIATE Intermediate     PIP/TAZO <=4 SENSITIVE Sensitive     * >=100,000 COLONIES/mL ESCHERICHIA COLI  MRSA Next Gen by PCR, Nasal     Status: None   Collection Time: 10/05/21  6:01 AM   Specimen: Nasal Mucosa; Nasal Swab  Result Value Ref Range Status   MRSA by PCR Next Gen NOT DETECTED NOT DETECTED Final    Comment: (NOTE) The GeneXpert MRSA Assay (FDA approved for NASAL specimens only), is one component of a comprehensive MRSA colonization surveillance program. It is not intended to diagnose MRSA infection nor to guide or monitor treatment for MRSA infections. Test performance is not FDA approved in patients less than 47 years old. Performed at [facility], [place] [address] [place], [place] [address]     Studies/Results: CT ABDOMEN PELVIS WO CONTRAST  Result Date: 10/07/2021 CLINICAL DATA:  Emphysematous pyelonephritis, follow-up exam EXAM: CT ABDOMEN AND PELVIS WITHOUT CONTRAST TECHNIQUE: Multidetector CT imaging of the abdomen and pelvis was performed following the standard protocol without IV contrast. RADIATION DOSE REDUCTION: This exam was performed according to the departmental dose-optimization program which includes automated exposure control, adjustment of the mA and/or kV according to patient size and/or use of iterative reconstruction technique. COMPARISON:  10/05/2021 FINDINGS:  Lower chest: Bilateral pleural effusions are noted right greater than left. Additionally there is considerable increase consolidation in the lower lobes bilaterally as well as the right middle lobe and left lingula. Hepatobiliary: Gallbladder is within normal limits. Cirrhotic change of the liver is again identified. Pancreas: Unremarkable. No pancreatic ductal dilatation or surrounding inflammatory  changes. Spleen: Surgically removed Adrenals/Urinary Tract: Adrenal glands are stable. Left kidney shows no calculi or obstructive changes. The right kidney again demonstrates subcapsular air collections which are smaller than that seen on the prior exam however there is now extension of air into the perirenal fat particularly medially. Again no obstructive changes are seen. Inflammatory changes extend along the right pericolic gutter. Bladder is decompressed by Foley catheter. Stomach/Bowel: Appendix is within normal limits. No obstructive or inflammatory changes of the colon are seen. Stomach and small bowel are within normal limits. Vascular/Lymphatic: Aortic atherosclerotic changes are noted. There are again seen lymph nodes in the region of the celiac axis, mesentery and periportal region scattered retroperitoneal nodes are again seen and stable. Reproductive: Status post hysterectomy. No adnexal masses. Other: New free fluid is noted within the pelvis likely reactive in nature. Musculoskeletal: No acute or significant osseous findings. IMPRESSION: Persistent findings of subcapsular gas in the right kidney although the collections appear smaller there has been some extension into the Perirenal fatty tissues which is new from the prior exam. Scattered lymph nodes as described similar to that seen on the prior exam. Significant increase in the degree of infiltrate with associated effusions in the bases bilaterally. Electronically Signed   By: Inez Catalina M.D.   On: 10/07/2021 01:49   DG CHEST PORT 1 VIEW  Result Date: 10/06/2021 CLINICAL DATA:  Shortness of breath EXAM: PORTABLE CHEST 1 VIEW COMPARISON:  10/06/2021, 10/04/2021 FINDINGS: Extensive left greater than right pulmonary airspace disease grossly unchanged as compared with radiograph earlier today. Probable right pleural effusion. Obscured cardiomediastinal silhouette. No pneumothorax. IMPRESSION: 1. Grossly similar extensive left greater than right  airspace disease which may be due to edema and or pneumonia 2. Suspect right pleural effusion Electronically Signed   By: Donavan Foil M.D.   On: 10/06/2021 22:39    Assessment: Emphysematous pyelonephritis Acute renal insufficiency, resolved Thrombocytopenia  Plan: Continue conservative management as you are doing with IV antibiotics.  No plans for surgical intervention.  Would have much higher risk of mortality with surgery especially given that she is improving clinically.   Link Snuffer, MD Urology 10/08/2021, 5:48 PM

## 2021-10-08 NOTE — Progress Notes (Addendum)
NAME:  [name], MRN:  [id number], DOB:  1965/05/05, LOS: 3 ADMISSION DATE:  10/04/2021, CONSULTATION DATE:  10/05/21 REFERRING MD:  Dr Myna Hidalgo, CHIEF COMPLAINT:  Abd pain   History of Present Illness:  56 year old woman with history of metabolic syndrome with AC1/ CAD/ ischemic cardiomyopathy/NASH, lipemic spleen s/p splenectomy who presents with 2 days of diffuse myalgias, chills, N/V and abdominal pain. Workup in ER reveals emphysematous pyelonephritis with concurrent acute renal failure, thrombocytopenia. Also new diagnosis of cirrhosis.  Urology to be called by [facility].   PCCM asked to consult for hypotension refractory to fluids.  Pertinent  Medical History   Past Medical History:  Diagnosis Date   Anxiety    CAD (coronary artery disease)    a.  Inferior STEMI / CAD s/p DES to prox RCA 03/03/14, staged cath 03/05/14 for LAD showing significantly improved stenosis of mid LAD with resolution of prior thrombus.   Clostridium difficile colitis    Diabetes mellitus    Dyslipidemia    Fibromyalgia    GERD (gastroesophageal reflux disease)    H/O hiatal hernia    Ischemic cardiomyopathy    a. EF 45-50% by echo 03/04/14 with suspected mild acute diastolic CHF s/p dose of IV Lasix.   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   Sinus bradycardia    a. During 02/2014 admit, not on BB due to this.   Upper respiratory infection 2/15   states has dry non productive cough since then- "tickle in my throat" no fever    Significant Hospital Events: Including procedures, antibiotic start and stop dates in addition to other pertinent events   Admit 6/18 6/20 E. coli bacteremia.  Rocephin> Ancef  Interim History / Subjective:  Overnight, patient off vasopressors, increasing O2 demand, bipap started  Tmax 98.1, +3.1 L admission, -1L past 24 hours, 3.2L UOP  Subjective:  Complaints of general abdominal pain, some SOB, denies chest pain  Objective   Blood pressure 126/65, pulse 65, temperature 97.8 F (36.6 C), temperature source Oral, resp. rate 20, height 5' 2.5" (1.588 m), weight 78.1 kg, SpO2 92 %.        Intake/Output Summary (Last 24 hours) at 10/08/2021 0839 Last data filed at 10/08/2021 0600 Gross per 24 hour  Intake 871.57 ml  Output 4225 ml  Net -3353.43 ml   Filed Weights   10/06/21 0730 10/07/21 0500  Weight: 78.1 kg 78.1 kg    Examination: General: In bed, NAD, appears comfortable HEENT: MM pink/moist, anicteric, atraumatic Neuro: RASS 0, PERRL 40m, GCS 15 CV: S1S2, NSR, no m/r/g appreciated PULM:  clear in the upper lobes, clear in the lower lobes, trachea midline, chest expansion symmetric GI: soft, bsx4 active, generalized tenderness but improved to prior day Extremities: warm/dry, no pretibial edema, capillary refill less than 3 seconds  Skin:  no rashes or lesions noted  Labs: Platelets 7 > 22 > 15 Hemoglobin 10.5 > 11.3 Creatinine 1.49 > 1.15, BUN 65 > 57 Magnesium 1.8 WBC 8.9>10.1  Resolved Hospital Problem list   Lactic acidosis AGMA  Assessment & Plan:   Septic shock secondary to right emphysematous pyelonephritis-improved E Coli per Blood Cultures drawn 6/18 Off vasopressors for greater than 48 hours, Echo: LVEF 40 to 45%, RVSF normal  -Stopping midodrine.  -Goal MAP 60-65 -Rocephin narrowed to Ancef.  Continue Ancef. -Trend fever WBC  curve -Appreciate urology assistance regarding right kidney.  Acute respiratory failure with hypoxia, secondary to pulmonary edema, improving now on 5 L nasal cannula. Bilateral pleural effusions, as seen on CT abdomen on 6/20 -Second day of diuresis with 40 mg of Lasix x1.  Oxygenation continues to improve -As needed BiPAP and nightly -Pulmonary toilet -Goal SPO2 90 to 97%, wean oxygen to goal  Acute kidney injury, secondary to shock/severe sepsis, improving Creatinine  1.49 > 1.15, BUN 65 > 57 -Ensure renal perfusion. Goal MAP 65 or greater. -Avoid neprotoxic drugs as possible. 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  EF worsened from 45 to 40%.  -Holding ASA 81 -Continuous telemetry -As above -BiPAP PRN And nightly  Immunocompromised with hx of splenectomy -Continue supportive care -Continue ceftriaxone -Trend fever curve and WBC  Thrombocytopenia, acute vs chronic- PLT 7>22>15, unlikely sequestration in setting of splenectomy, more likely septic response.  Last normal in 2015.  P platelets 27 on admission. -Continue to follow CBC, platelet -Platelet transfusion for platelets less than 10 -Monitor for bleeding -Checking CBC with Dif, peripheral smear, LDH, haptoglobin. Pending results consider heme consult.   DM2 with hyperglycemia Overnight hypoglycemic 6/19 -Blood Glucose goal 140-180. -SSI -Levemir 5 units twice daily, appreciate diabetes assistance.  Pain -As needed Tylenol  Hypomagnesia -replete -follow up on AM labs  Stable for transfer to progressive care  Best Practice (right click and "Reselect all SmartList Selections" daily)   Diet/type: NPO DVT prophylaxis: SCD GI prophylaxis: PPI Lines: N/A Foley:  Yes, and it is still needed Code Status:  full code Last date of multidisciplinary goals of care discussion [pending]  Critical care time: n/a      [employer]mond [employer]., MSN, APRN, AGACNP-BC [facility]  10/08/2021 , 8:39 AM  Please see [email or web address] for pager details  If no response, please call [phone] After hours, please call Elink at [phone]

## 2021-10-08 NOTE — Consult Note (Signed)
[facility]    Date of Admission:  10/04/2021     Reason for Consult: severe sepsis, emphysematous pyelo right side    Referring Provider: Adelfa Koh Chi   Lines:  Peripheral iv 6/18-c foley catheter  Abx: 6/8-20; 6/21-c  6/20-21 cefazolin        Assessment: 56 yo female with cad, dm2, nash cirrhosis, hx cdiff 10 years prior to this admission, admitted 6/17 with n/v/diarrhea, myalgia, s/p splenectomy for ?lipemic spleenfound to meet severe sepsis/septic shock criteria in setting emphysematous right sided pyelonephritis and ecoli bacteremia, complicated by chf with associated respiratory failure, thrombocytopenia  6/18 admission bcx ecoli (R cefazolin MIC 4 for blood) 6/18 ucx ecoli (S cefazolin)  Urology following.  Clinically improving with conservative management although prognosis still guarded at this time  Of note 6/20 repeat ct showed worsening bilateral effusion but no sign/sx of ards or pneumonia   Septic shock related complications: Aki improving Thrombocytopenia still severely so Chf/hypoxemic respiratory failure improving with diuresis/supportive care Elevated troponin in setting sepsis/aki/chf. Demand ischemia, improved.    Plan: Change abx to ceftriaxone Follows clinically Anticipate at least 2 weeks of abx and might need longer course if slow response in terms of emphysematous pyelonephritis Monitor for cdiff; no hard indication for 2nd prophylaxis cdiff at this time Discussed with primary team    I spent 75 minute reviewing data/chart, and coordinating care and >50% direct face to face time providing counseling/discussing diagnostics/treatment plan with patient       ------------------------------------------------ Principal Problem:   Emphysematous pyelonephritis Active Problems:   Insulin-requiring or dependent type II diabetes mellitus (HCC)   CAD (coronary artery disease)   Hypotension    Thrombocytopenia (HCC)   AKI (acute kidney injury) ([place])   Elevated troponin   Acidosis   Cirrhosis ([place])   Septic shock ([place])   Acute respiratory failure with hypoxia ([place])    HPI: [name] is a 56 y.o. 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 Sx progressed and then she developed malaise, myalgia, and some chest pain just prior to admission  She has no dysuria, hematuria. The has generalized myalgia so couldn't tell between flank pain or not.   EMS was called and she was hypotensive when they arrive In the ED afebrile, sbp 70s required icu admission and pressor No leukocytosis but platelet 27k, elevated trops, and cr 3's Ct showed emphysematous pyelo right kidney and cirrhosis finding on liver with upper abd varices Bcx and ucx subsequently with ecoli nonesbl  Subsequently has hypoxemic resp failure in setting pulm edema and acute chf  She was started on ceftriaxone but changed to cefazolin 6/20 when cefazolin mic returned.  Urology was consulted. No drain placed due to no perirenal abscess She is slowly improving off pressors; oxygenation improving only requiring supplemental o2 getting diuresis; trops also improved.   Repeat ct showed improvement of gas around the right kidney. Still no plan to place drain or do surgery at this time  Since admission n/v had resolved, but still malaise and diffuse myalgia      Of note, she had a hx of cdiff due to abx use 10 years ago  Family History  Problem Relation Age of Onset   Arthritis Mother    Diabetes Father    Kidney disease Father    Colon cancer Neg Hx     Social History   Tobacco Use   Smoking status: Former     Packs/day: 0.25    Years: 30.00    Total pack years: 7.50    Types: Cigarettes    Quit date: 04/20/2006    Years since quitting: 15.4   Smokeless tobacco: Never  Substance Use Topics   Alcohol use: Yes    Alcohol/week: 0.0 standard drinks of alcohol    Comment: rarely   Drug use: No    Allergies  Allergen Reactions   Aspartame And Phenylalanine Anaphylaxis, Shortness Of Breath and Swelling    Throat swells  ANY ARTIFICAL SWEETNERS   Toradol [Ketorolac Tromethamine] Other (See Comments)    Face & neck flushed red & pt felt very hot after IV adm.   Sucralose Rash    Pt.s face broke out in rash after drinking Breeza.  Pt. Claims it is from the artificial sweetener.      Codeine Swelling   Dilaudid [Hydromorphone Hcl] Itching   Hydrocodone Itching   Oxycodone Itching    Review of Systems: ROS All Other ROS was negative, except mentioned above   Past Medical History:  Diagnosis Date   Anxiety    CAD (coronary artery disease)    a.  Inferior STEMI / CAD s/p DES to prox RCA 03/03/14, staged cath 03/05/14 for LAD showing significantly improved stenosis of mid LAD with resolution of prior thrombus.   Clostridium difficile colitis    Diabetes mellitus    Dyslipidemia    Fibromyalgia    GERD (gastroesophageal reflux disease)    H/O hiatal hernia    Ischemic cardiomyopathy    a. EF 45-50% by echo 03/04/14 with suspected mild acute diastolic CHF s/p dose of IV Lasix.   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   Sinus bradycardia    a. During 02/2014 admit, not on BB due to this.   Upper respiratory infection 2/15   states has dry non productive cough since then- "tickle in my throat" no fever       Scheduled Meds:  sodium chloride   Intravenous Once   Chlorhexidine Gluconate Cloth  6 each Topical Daily   DULoxetine  60 mg Oral QHS   insulin aspart  0-6  Units Subcutaneous Q4H   insulin glargine-yfgn  15 Units Subcutaneous BID   pantoprazole  40 mg Oral Daily   polyethylene glycol  17 g Oral BID   senna-docusate  1 tablet Oral BID   sodium chloride flush  3 mL Intravenous Q12H   Continuous Infusions:  sodium chloride Stopped (10/05/21 0517)    ceFAZolin (ANCEF) IV 2 g (10/08/21 1312)   PRN Meds:.acetaminophen **OR** acetaminophen, lip balm, ondansetron **OR** ondansetron (ZOFRAN) IV, mouth rinse, traMADol   OBJECTIVE: Blood pressure 99/62, pulse 67, temperature 98.7 F (37.1 C), temperature source Axillary, resp. rate 17, height 5' 2.5" (1.588 m), weight 74.9 kg, SpO2 95 %.  Physical Exam  General/constitutional: no distress, pleasant HEENT: Normocephalic, PER, Conj Clear, EOMI, Oropharynx clear Neck supple CV: rrr no mrg Lungs: clear to auscultation, normal respiratory effort Abd: Soft, Nontender Ext: no edema Skin: No Rash Neuro: nonfocal MSK: no peripheral joint swelling/tenderness/warmth;  back spines nontender   Central line presence: no   Lab Results Lab Results  Component Value Date   WBC 10.1 10/08/2021   HGB 11.3 (L) 10/08/2021   HCT 30.5 (L) 10/08/2021   MCV 90.5 10/08/2021   PLT 15 (LL) 10/08/2021    Lab Results  Component Value Date   CREATININE 1.15 (H) 10/08/2021   BUN 57 (H) 10/08/2021   NA 137 10/08/2021   K 3.5 10/08/2021   CL 101 10/08/2021   CO2 23 10/08/2021    Lab Results  Component Value Date   ALT 18 10/06/2021   AST 30 10/06/2021   ALKPHOS 75 10/06/2021   BILITOT 1.8 (H) 10/06/2021      Microbiology: Recent Results (from the past 240 hour(s))  Blood culture (routine x 2)     Status: Abnormal   Collection Time: 10/05/21  2:53 AM   Specimen: BLOOD  Result Value Ref Range Status   Specimen Description BLOOD RIGHT ARM  Final   Special Requests   Final    BOTTLES DRAWN AEROBIC AND ANAEROBIC Blood Culture results may not be optimal due to an inadequate volume of blood received in  culture bottles   Culture  Setup Time   Final    GRAM NEGATIVE RODS IN BOTH AEROBIC AND ANAEROBIC BOTTLES CRITICAL VALUE NOTED.  VALUE IS CONSISTENT WITH PREVIOUSLY REPORTED AND CALLED VALUE.    Culture (A)  Final    ESCHERICHIA COLI SUSCEPTIBILITIES PERFORMED ON PREVIOUS CULTURE WITHIN THE LAST 5 DAYS. Performed at [facility], [place] [address], [place], [place] [address]    Report Status 10/07/2021 FINAL  Final  Blood culture (routine x 2)     Status: Abnormal   Collection Time: 10/05/21  2:53 AM   Specimen: BLOOD  Result Value Ref Range Status   Specimen Description BLOOD LEFT HAND  Final   Special Requests   Final    BOTTLES DRAWN AEROBIC AND ANAEROBIC Blood Culture results may not be optimal due to an inadequate volume of blood received in culture bottles   Culture  Setup Time   Final    GRAM NEGATIVE RODS IN BOTH AEROBIC AND ANAEROBIC BOTTLES CRITICAL RESULT CALLED TO, READ BACK BY AND VERIFIED WITH: PHARMD K.AMEND AT 1636 ON 10/05/2021 BY T.SAAD. Performed at [facility], [place] [address] [place], [place] [address]    Culture ESCHERICHIA COLI (A)  Final   Report Status 10/07/2021 FINAL  Final   Organism ID, Bacteria ESCHERICHIA COLI  Final      Susceptibility   Escherichia coli - MIC*    AMPICILLIN >=32 RESISTANT Resistant     CEFAZOLIN <=4 SENSITIVE Sensitive     CEFEPIME <=0.12 SENSITIVE Sensitive     CEFTAZIDIME <=1 SENSITIVE Sensitive     CEFTRIAXONE <=0.25 SENSITIVE Sensitive     CIPROFLOXACIN <=0.25 SENSITIVE Sensitive     GENTAMICIN <=1 SENSITIVE Sensitive     IMIPENEM <=0.25 SENSITIVE Sensitive     TRIMETH/SULFA <=20 SENSITIVE Sensitive     AMPICILLIN/SULBACTAM 16 INTERMEDIATE Intermediate     PIP/TAZO <=4 SENSITIVE Sensitive     * ESCHERICHIA COLI  SARS Coronavirus 2 by RT PCR (hospital order, performed in [facility] hospital lab) *cepheid single result test* Anterior Nasal Swab     Status: None   Collection Time: 10/05/21  2:53 AM   Specimen:  Anterior Nasal Swab  Result Value Ref Range Status   SARS Coronavirus 2 by RT PCR NEGATIVE NEGATIVE Final  Comment: (NOTE) SARS-CoV-2 target nucleic acids are NOT DETECTED.  The SARS-CoV-2 RNA is generally detectable in upper and lower respiratory specimens during the acute phase of infection. The lowest concentration of SARS-CoV-2 viral copies this assay can detect is 250 copies / mL. A negative result does not preclude SARS-CoV-2 infection and should not be used as the sole basis for treatment or other patient management decisions.  A negative result may occur with improper specimen collection / handling, submission of specimen other than nasopharyngeal swab, presence of viral mutation(s) within the areas targeted by this assay, and inadequate number of viral copies (<250 copies / mL). A negative result must be combined with clinical observations, patient history, and epidemiological information.  Fact Sheet for Patients:   https://www.patel.info/  Fact Sheet for Healthcare Providers: https://hall.com/  This test is not yet approved or  cleared by the [place] FDA and has been authorized for detection and/or diagnosis of SARS-CoV-2 by FDA under an Emergency Use Authorization (EUA).  This EUA will remain in effect (meaning this test can be used) for the duration of the COVID-19 declaration under Section 564(b)(1) of the Act, 21 U.S.C. section 360bbb-3(b)(1), unless the authorization is terminated or revoked sooner.  Performed at [facility], [place] [address] [place],  [address]   Blood Culture ID Panel (Reflexed)     Status: Abnormal   Collection Time: 10/05/21  2:53 AM  Result Value Ref Range Status   Enterococcus faecalis NOT DETECTED NOT DETECTED Final   Enterococcus Faecium NOT DETECTED NOT DETECTED Final   Listeria monocytogenes NOT DETECTED NOT DETECTED Final   Staphylococcus species NOT DETECTED NOT  DETECTED Final   Staphylococcus aureus (BCID) NOT DETECTED NOT DETECTED Final   Staphylococcus epidermidis NOT DETECTED NOT DETECTED Final   Staphylococcus lugdunensis NOT DETECTED NOT DETECTED Final   Streptococcus species NOT DETECTED NOT DETECTED Final   Streptococcus agalactiae NOT DETECTED NOT DETECTED Final   Streptococcus pneumoniae NOT DETECTED NOT DETECTED Final   Streptococcus pyogenes NOT DETECTED NOT DETECTED Final   A.calcoaceticus-baumannii NOT DETECTED NOT DETECTED Final   Bacteroides fragilis NOT DETECTED NOT DETECTED Final   Enterobacterales DETECTED (A) NOT DETECTED Final    Comment: Enterobacterales represent a large order of gram negative bacteria, not a single organism. CRITICAL RESULT CALLED TO, READ BACK BY AND VERIFIED WITH: PHARMD K.AMEND AT 1636 ON 10/05/2021 BY T.SAAD.    Enterobacter cloacae complex NOT DETECTED NOT DETECTED Final   Escherichia coli DETECTED (A) NOT DETECTED Final    Comment: CRITICAL RESULT CALLED TO, READ BACK BY AND VERIFIED WITH: PHARMD K.AMEND AT 1636 ON 10/05/2021 BY T.SAAD.    Klebsiella aerogenes NOT DETECTED NOT DETECTED Final   Klebsiella oxytoca NOT DETECTED NOT DETECTED Final   Klebsiella pneumoniae NOT DETECTED NOT DETECTED Final   Proteus species NOT DETECTED NOT DETECTED Final   Salmonella species NOT DETECTED NOT DETECTED Final   Serratia marcescens NOT DETECTED NOT DETECTED Final   Haemophilus influenzae NOT DETECTED NOT DETECTED Final   Neisseria meningitidis NOT DETECTED NOT DETECTED Final   Pseudomonas aeruginosa NOT DETECTED NOT DETECTED Final   Stenotrophomonas maltophilia NOT DETECTED NOT DETECTED Final   Candida albicans NOT DETECTED NOT DETECTED Final   Candida auris NOT DETECTED NOT DETECTED Final   Candida glabrata NOT DETECTED NOT DETECTED Final   Candida krusei NOT DETECTED NOT DETECTED Final   Candida parapsilosis NOT DETECTED NOT DETECTED Final   Candida tropicalis NOT DETECTED NOT DETECTED Final  Cryptococcus neoformans/gattii NOT DETECTED NOT DETECTED Final   CTX-M ESBL NOT DETECTED NOT DETECTED Final   Carbapenem resistance IMP NOT DETECTED NOT DETECTED Final   Carbapenem resistance KPC NOT DETECTED NOT DETECTED Final   Carbapenem resistance NDM NOT DETECTED NOT DETECTED Final   Carbapenem resist OXA 48 LIKE NOT DETECTED NOT DETECTED Final   Carbapenem resistance VIM NOT DETECTED NOT DETECTED Final    Comment: Performed at [facility], [place] [address], [place], [place] [address]  Urine Culture     Status: Abnormal   Collection Time: 10/05/21  3:11 AM   Specimen: Urine, Clean Catch  Result Value Ref Range Status   Specimen Description URINE, CLEAN CATCH  Final   Special Requests   Final    NONE Performed at [facility], [place] [address] [place], [place] [address]    Culture >=100,000 COLONIES/mL ESCHERICHIA COLI (A)  Final   Report Status 10/07/2021 FINAL  Final   Organism ID, Bacteria ESCHERICHIA COLI (A)  Final      Susceptibility   Escherichia coli - MIC*    AMPICILLIN >=32 RESISTANT Resistant     CEFAZOLIN <=4 SENSITIVE Sensitive     CEFEPIME <=0.12 SENSITIVE Sensitive     CEFTRIAXONE <=0.25 SENSITIVE Sensitive     CIPROFLOXACIN <=0.25 SENSITIVE Sensitive     GENTAMICIN <=1 SENSITIVE Sensitive     IMIPENEM <=0.25 SENSITIVE Sensitive     NITROFURANTOIN <=16 SENSITIVE Sensitive     TRIMETH/SULFA <=20 SENSITIVE Sensitive     AMPICILLIN/SULBACTAM 16 INTERMEDIATE Intermediate     PIP/TAZO <=4 SENSITIVE Sensitive     * >=100,000 COLONIES/mL ESCHERICHIA COLI  MRSA Next Gen by PCR, Nasal     Status: None   Collection Time: 10/05/21  6:01 AM   Specimen: Nasal Mucosa; Nasal Swab  Result Value Ref Range Status   MRSA by PCR Next Gen NOT DETECTED NOT DETECTED Final    Comment: (NOTE) The GeneXpert MRSA Assay (FDA approved for NASAL specimens only), is one component of a comprehensive MRSA colonization surveillance program. It is not intended to diagnose MRSA  infection nor to guide or monitor treatment for MRSA infections. Test performance is not FDA approved in patients less than 75 years old. Performed at [facility], [place] [address] [place], [place] [address]      Serology:    Imaging: If present, new imagings (plain films, ct scans, and mri) have been personally visualized and interpreted; radiology reports have been reviewed. Decision making incorporated into the Impression / Recommendations.  6/20 ct abd pelv without contrast Persistent findings of subcapsular gas in the right kidney although the collections appear smaller there has been some extension into the Perirenal fatty tissues which is new from the prior exam.   Scattered lymph nodes as described similar to that seen on the prior exam.   Significant increase in the degree of infiltrate with associated effusions in the bases bilaterally.     6/18 ct abd pelv without contrast 1. Subcapsular and parenchymal gas collections within the right kidney. Unless there is been recent biopsy or other procedure, this is likely to represent infection with gas-forming organism. Inflammatory stranding suggested along the right pericolic gutter. 2. 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 3. Aortic atherosclerosis.  6/17 cxr No active cardiopulmonary disease.    Jabier Mutton, [facility] [place] [phone] pager    10/08/2021, 2:12 PM

## 2021-10-09 ENCOUNTER — Inpatient Hospital Stay (HOSPITAL_COMMUNITY): Payer: Commercial Managed Care - HMO

## 2021-10-09 DIAGNOSIS — J9601 Acute respiratory failure with hypoxia: Secondary | ICD-10-CM | POA: Diagnosis not present

## 2021-10-09 DIAGNOSIS — N179 Acute kidney failure, unspecified: Secondary | ICD-10-CM | POA: Diagnosis not present

## 2021-10-09 DIAGNOSIS — B9620 Unspecified Escherichia coli [E. coli] as the cause of diseases classified elsewhere: Secondary | ICD-10-CM

## 2021-10-09 DIAGNOSIS — R7881 Bacteremia: Secondary | ICD-10-CM

## 2021-10-09 DIAGNOSIS — K7469 Other cirrhosis of liver: Secondary | ICD-10-CM

## 2021-10-09 DIAGNOSIS — N12 Tubulo-interstitial nephritis, not specified as acute or chronic: Secondary | ICD-10-CM | POA: Diagnosis not present

## 2021-10-09 LAB — CBC
HCT: 28.2 % — ABNORMAL LOW (ref 36.0–46.0)
Hemoglobin: 10.1 g/dL — ABNORMAL LOW (ref 12.0–15.0)
MCH: 32.8 pg (ref 26.0–34.0)
MCHC: 35.8 g/dL (ref 30.0–36.0)
MCV: 91.6 fL (ref 80.0–100.0)
Platelets: 22 10*3/uL — CL (ref 150–400)
RBC: 3.08 MIL/uL — ABNORMAL LOW (ref 3.87–5.11)
RDW: 14.6 % (ref 11.5–15.5)
WBC: 12 10*3/uL — ABNORMAL HIGH (ref 4.0–10.5)
nRBC: 0.3 % — ABNORMAL HIGH (ref 0.0–0.2)

## 2021-10-09 LAB — GLUCOSE, CAPILLARY
Glucose-Capillary: 139 mg/dL — ABNORMAL HIGH (ref 70–99)
Glucose-Capillary: 131 mg/dL — ABNORMAL HIGH (ref 70–99)
Glucose-Capillary: 259 mg/dL — ABNORMAL HIGH (ref 70–99)
Glucose-Capillary: 187 mg/dL — ABNORMAL HIGH (ref 70–99)
Glucose-Capillary: 331 mg/dL — ABNORMAL HIGH (ref 70–99)

## 2021-10-09 LAB — MAGNESIUM: Magnesium: 1.7 mg/dL (ref 1.7–2.4)

## 2021-10-09 LAB — HEPATIC FUNCTION PANEL
ALT: 11 U/L (ref 0–44)
AST: 14 U/L — ABNORMAL LOW (ref 15–41)
Albumin: 1.9 g/dL — ABNORMAL LOW (ref 3.5–5.0)
Alkaline Phosphatase: 89 U/L (ref 38–126)
Bilirubin, Direct: 0.5 mg/dL — ABNORMAL HIGH (ref 0.0–0.2)
Indirect Bilirubin: 0.6 mg/dL (ref 0.3–0.9)
Total Bilirubin: 1.1 mg/dL (ref 0.3–1.2)
Total Protein: 5.1 g/dL — ABNORMAL LOW (ref 6.5–8.1)

## 2021-10-09 LAB — BASIC METABOLIC PANEL
Anion gap: 10 (ref 5–15)
BUN: 47 mg/dL — ABNORMAL HIGH (ref 6–20)
CO2: 26 mmol/L (ref 22–32)
Calcium: 8 mg/dL — ABNORMAL LOW (ref 8.9–10.3)
Chloride: 99 mmol/L (ref 98–111)
Creatinine, Ser: 1 mg/dL (ref 0.44–1.00)
GFR, Estimated: >60 mL/min (ref >60)
Glucose, Bld: 140 mg/dL — ABNORMAL HIGH (ref 70–99)
Potassium: 3.5 mmol/L (ref 3.5–5.1)
Sodium: 135 mmol/L (ref 135–145)

## 2021-10-09 LAB — HAPTOGLOBIN: Haptoglobin: 326 mg/dL (ref 33–346)

## 2021-10-09 MED ORDER — ENSURE ENLIVE PO LIQD
237.0000 mL | Freq: Three times a day (TID) | ORAL | Status: DC
  Administered 2021-10-13 – 2021-10-15 (×2): 237 mL via ORAL

## 2021-10-09 MED ORDER — ADULT MULTIVITAMIN W/MINERALS CH
1.0000 | ORAL_TABLET | Freq: Every day | ORAL | Status: DC
  Administered 2021-10-09 – 2021-10-28 (×19): 1 via ORAL
  Filled 2021-10-09 (×20): qty 1

## 2021-10-09 MED ORDER — FUROSEMIDE 10 MG/ML IJ SOLN
40.0000 mg | Freq: Every day | INTRAMUSCULAR | Status: DC
Start: 2021-10-09 — End: 2021-10-10
  Administered 2021-10-09 – 2021-10-10 (×2): 40 mg via INTRAVENOUS
  Filled 2021-10-09 (×2): qty 4

## 2021-10-09 MED ORDER — INSULIN ASPART 100 UNIT/ML IJ SOLN
0.0000 [IU] | Freq: Three times a day (TID) | INTRAMUSCULAR | Status: DC
  Administered 2021-10-09: 4 [IU] via SUBCUTANEOUS
  Administered 2021-10-10: 2 [IU] via SUBCUTANEOUS
  Administered 2021-10-10: 1 [IU] via SUBCUTANEOUS
  Administered 2021-10-10: 4 [IU] via SUBCUTANEOUS
  Administered 2021-10-11 (×2): 2 [IU] via SUBCUTANEOUS
  Administered 2021-10-11: 4 [IU] via SUBCUTANEOUS
  Administered 2021-10-12: 2 [IU] via SUBCUTANEOUS

## 2021-10-09 MED ORDER — GABAPENTIN 300 MG PO CAPS: 300.0000 mg | ORAL_CAPSULE | Freq: Three times a day (TID) | ORAL | Status: DC

## 2021-10-09 MED ORDER — INSULIN ASPART 100 UNIT/ML IJ SOLN
0.0000 [IU] | Freq: Three times a day (TID) | INTRAMUSCULAR | Status: DC
  Administered 2021-10-09 (×2): 1 [IU] via SUBCUTANEOUS

## 2021-10-09 MED ORDER — GABAPENTIN 300 MG PO CAPS
300.0000 mg | ORAL_CAPSULE | Freq: Three times a day (TID) | ORAL | Status: DC
  Administered 2021-10-09 – 2021-10-28 (×57): 300 mg via ORAL
  Filled 2021-10-09 (×58): qty 1

## 2021-10-09 NOTE — Evaluation (Signed)
Physical Therapy Evaluation Patient Details Name: [name] MRN: [id number] DOB: Jun 02, 1965 Today's Date: 10/09/2021  History of Present Illness  Pt is a 56 y.o. female admitted 10/04/21 with c/o SOB, chest pain, N/V/D, loss of appetite. 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 PMH includes CAD, DM, fibromyalgia, neuropathy, ischemic cardiomyopathy, sinus bradycardia, NASH.   Clinical Impression  Pt presents with an overall decrease in functional mobility secondary to above. PTA, pt independent without DME, does not work, lives with significant other who is available to assist at d/c. Today, pt requiring increased time and effort with mobility secondary to pain and fatigue ("what doesn't hurt); pt tolerated brief bout of standing activity with RW and minA. Pt motivated to participate to regain PLOF and return home. Pt would benefit from continued acute PT services to maximize functional mobility and independence prior to d/c with HHPT services.   SpO2 87% on RA with mobility; SpO2 94% on 2L O2 [place] Sitting BP 120/59, post-transfer 99/54 HR 85     Recommendations for follow up therapy are one component of a multi-disciplinary discharge planning process, led by the attending physician.  Recommendations may be updated based on patient status, additional functional criteria and insurance authorization.  Follow Up Recommendations Home health PT      Assistance Recommended at Discharge Intermittent Supervision/Assistance  Patient can return home with the following  A little help with walking and/or transfers;A little help with bathing/dressing/bathroom;Assistance with cooking/housework;Assist for transportation;Help with stairs or ramp for entrance    Equipment Recommendations  (TBD)  Recommendations for Other Services  OT consult    Functional Status Assessment Patient has had a recent decline  in their functional status and demonstrates the ability to make significant improvements in function in a reasonable and predictable amount of time.     Precautions / Restrictions Precautions Precautions: Fall;Other (comment) Precaution Comments: Watch SpO2 (does not wear O2 at home), BP Restrictions Weight Bearing Restrictions: No      Mobility  Bed Mobility Overal bed mobility: Needs Assistance Bed Mobility: Supine to Sit     Supine to sit: Mod assist     General bed mobility comments: increased time and effort with c/o pain and fatigue; modA for HHA to elevate trunk and scoot L hip to EOB; prolonged time recovering once sitting up    Transfers Overall transfer level: Needs assistance Equipment used: Rolling walker (2 wheels) Transfers: Sit to/from Stand, Bed to chair/wheelchair/BSC Sit to Stand: Min assist   Step pivot transfers: Min assist       General transfer comment: pt unable to stand fully with RW on initial attempt due to c/o bilateral feet sensitivity with weight bearing; cues for hand placement, pt able to stand on second trial with minA for trunk elevation; pivotal steps to recliner with minA for RW management, min guard for pt balance    Ambulation/Gait               General Gait Details: limited by pt c/o fatigue and pain  Stairs            Wheelchair Mobility    Modified Rankin (Stroke Patients Only)       Balance Overall balance assessment: Needs assistance Sitting-balance support: No upper extremity supported, Feet unsupported Sitting balance-Leahy Scale: Fair Sitting balance - Comments: requires assist to don socks   Standing balance support: During functional activity, Reliant on assistive device for balance Standing balance-Leahy Scale:  Poor                               Pertinent Vitals/Pain Pain Assessment Pain Assessment: Faces Faces Pain Scale: Hurts even more Pain Location: "what doesn't hurt" -  especially bilateral feet with weight bearing Pain Descriptors / Indicators: Discomfort, Grimacing, Guarding, Moaning, Burning Pain Intervention(s): Monitored during session, Limited activity within patient's tolerance    Home Living Family/patient expects to be discharged to:: Private residence Living Arrangements: Spouse/significant other Available Help at Discharge: Family;Available 24 hours/day Type of Home: Apartment Home Access: Level entry       Home Layout: One level Home Equipment: None Additional Comments: Significant other works maintenance for apartment complex they live in, so he can be available whenever needed    Prior Function Prior Level of Function : Independent/Modified Independent             Mobility Comments: Independent without DME; does not work, on [employer] comp with "missing rib cartilage", still has difficulty reaching RUE above head ADLs Comments: Indep without assist     Hand Dominance        Extremity/Trunk Assessment   Upper Extremity Assessment Upper Extremity Assessment: Generalized weakness;RUE deficits/detail RUE Deficits / Details: baseline limited R shoulder overhead motion from prior injury    Lower Extremity Assessment Lower Extremity Assessment: Generalized weakness       Communication   Communication: No difficulties  Cognition Arousal/Alertness: Awake/alert Behavior During Therapy: WFL for tasks assessed/performed, Flat affect Overall Cognitive Status: Within Functional Limits for tasks assessed                                          General Comments General comments (skin integrity, edema, etc.): SpO2 down to 87% on RA, maintaining 94% on 2L at end of session; HR 80s. pt's significant other present and supportive; both agreeable with plan for pt to return home ("she'd be too stubborn to stay at rehab anyways... I'll carry her down the street if needed")    Exercises     Assessment/Plan    PT  Assessment Patient needs continued PT services  PT Problem List Decreased strength;Decreased activity tolerance;Decreased balance;Decreased mobility;Cardiopulmonary status limiting activity;Decreased knowledge of use of DME;Pain       PT Treatment Interventions DME instruction;Gait training;Functional mobility training;Therapeutic activities;Therapeutic exercise;Balance training;Patient/family education    PT Goals (Current goals can be found in the Care Plan section)  Acute Rehab PT Goals Patient Stated Goal: return home PT Goal Formulation: With patient/family Time For Goal Achievement: 10/23/21 Potential to Achieve Goals: Good    Frequency Min 4X/week     Co-evaluation               AM-PAC PT "6 Clicks" Mobility  Outcome Measure Help needed turning from your back to your side while in a flat bed without using bedrails?: A Little Help needed moving from lying on your back to sitting on the side of a flat bed without using bedrails?: A Lot Help needed moving to and from a bed to a chair (including a wheelchair)?: A Lot Help needed standing up from a chair using your arms (e.g., wheelchair or bedside chair)?: A Little Help needed to walk in hospital room?: Total Help needed climbing 3-5 steps with a railing? : Total 6 Click Score: 12  End of Session Equipment Utilized During Treatment: Gait belt Activity Tolerance: Patient tolerated treatment well;Patient limited by pain;Patient limited by fatigue Patient left: in chair;with call bell/phone within reach (pt declined chair alarm turned on) Nurse Communication: Mobility status PT Visit Diagnosis: Other abnormalities of gait and mobility (R26.89);Muscle weakness (generalized) (M62.81);Pain    Time: 2550-0164 PT Time Calculation (min) (ACUTE ONLY): 30 min   Charges:   PT Evaluation $PT Eval Moderate Complexity: 1 Mod PT Treatments $Therapeutic Activity: 8-22 mins      Mabeline Caras, PT, DPT Acute Rehabilitation  Services  Pager [phone] Office [phone]  Derry Lory 10/09/2021, 3:38 PM

## 2021-10-09 NOTE — Progress Notes (Signed)
[facility]  Date of Admission:  10/04/2021     Lines:  Peripheral iv 6/18-c foley catheter   Abx: 6/8-20; 6/21-c   6/20-21 cefazolin                                                             Assessment: 56 yo female with cad, dm2, nash cirrhosis, hx cdiff 10 years prior to this admission, admitted 6/17 with n/v/diarrhea, myalgia, s/p splenectomy for ?lipemic spleenfound to meet severe sepsis/septic shock criteria in setting emphysematous right sided pyelonephritis and ecoli bacteremia, complicated by chf with associated respiratory failure, thrombocytopenia   6/18 admission bcx ecoli (R cefazolin MIC </=4 for blood); of note, our labs does not have capability to test for mic less than 4 6/18 ucx ecoli (S cefazolin)   Urology following.   Clinically improving with conservative management although prognosis still guarded at this time   Of note 6/20 repeat ct showed worsening bilateral effusion but no sign/sx of ards or pneumonia     Septic shock related complications: Aki improving Thrombocytopenia still severely so Chf/hypoxemic respiratory failure improving with diuresis/supportive care Elevated troponin in setting sepsis/aki/chf. Demand ischemia, improved.     ---------- 6/22 assessment Clinically feels same, body ache, malaise Afebrile Wbc slight uptik at 12 -- unclear significance     Plan: Continue ceftriaxone Defer to urology timing of repeat abd ct No obvious decline today Anticipate at least 2 weeks of abx and might need longer course if slow response in terms of emphysematous pyelonephritis Monitor for cdiff; no hard indication for 2nd prophylaxis cdiff at this time Discussed with primary team      I spent more than 35 minute reviewing data/chart, and coordinating care and >50% direct face to face time providing counseling/discussing diagnostics/treatment plan with patient   Principal Problem:   Emphysematous  pyelitis Active Problems:   Insulin-requiring or dependent type II diabetes mellitus ([place])   CAD (coronary artery disease)   Hypotension   Thrombocytopenia (HCC)   AKI (acute kidney injury) ([place])   Elevated troponin   Acidosis   Cirrhosis (HCC)   Septic shock (HCC)   Acute respiratory failure with hypoxia (HCC)   E coli bacteremia   Allergies  Allergen Reactions   Aspartame And Phenylalanine Anaphylaxis, Shortness Of Breath and Swelling    Throat swells  ANY ARTIFICAL SWEETNERS   Toradol [Ketorolac Tromethamine] Other (See Comments)    Face & neck flushed red & pt felt very hot after IV adm.   Sucralose Rash    Pt.s face broke out in rash after drinking Breeza.  Pt. Claims it is from the artificial sweetener.      Codeine Swelling   Dilaudid [Hydromorphone Hcl] Itching   Hydrocodone Itching   Oxycodone Itching    Scheduled Meds:  sodium chloride   Intravenous Once   Chlorhexidine Gluconate Cloth  6 each Topical Daily   DULoxetine  60 mg Oral QHS   furosemide  40 mg Intravenous Daily   gabapentin  300 mg Oral TID   insulin aspart  0-6 Units Subcutaneous Q4H   insulin glargine-yfgn  15 Units Subcutaneous BID   pantoprazole  40 mg Oral Daily   polyethylene glycol  17 g Oral BID  senna-docusate  1 tablet Oral BID   sodium chloride flush  3 mL Intravenous Q12H   Continuous Infusions:  sodium chloride Stopped (10/05/21 0517)   cefTRIAXone (ROCEPHIN)  IV Stopped (10/08/21 1749)   PRN Meds:.acetaminophen **OR** acetaminophen, lip balm, ondansetron **OR** ondansetron (ZOFRAN) IV, mouth rinse, traMADol   SUBJECTIVE: Feels about the same No fever chill No n/v/diarrhea Muscle ache still all over  Wbc slightly up   Review of Systems: ROS All other ROS was negative, except mentioned above     OBJECTIVE: Vitals:   10/08/21 2100 10/08/21 2246 10/09/21 0339 10/09/21 1213  BP: 120/66 118/65 (!) 111/57 112/67  Pulse: 71 78 80 75  Resp: '20 18 15 20  '$ Temp:  98.5 F  (36.9 C) 98.5 F (36.9 C) 98.5 F (36.9 C)  TempSrc:  Oral Oral Oral  SpO2: 96% 96% 90% 95%  Weight:      Height:       Body mass index is 29.72 kg/m.  Physical Exam General/constitutional: ill appearing; mild distress; pleasant; conversant HEENT: Normocephalic, PER, Conj Clear, EOMI, Oropharynx clear Neck supple CV: rrr no mrg Lungs: clear to auscultation, normal respiratory effort Abd: Soft, Nontender Ext: no edema Skin: No Rash Neuro: nonfocal MSK: diffuse myalgia on ext/trunk  Lab Results Lab Results  Component Value Date   WBC 12.0 (H) 10/09/2021   HGB 10.1 (L) 10/09/2021   HCT 28.2 (L) 10/09/2021   MCV 91.6 10/09/2021   PLT 22 (LL) 10/09/2021    Lab Results  Component Value Date   CREATININE 1.00 10/09/2021   BUN 47 (H) 10/09/2021   NA 135 10/09/2021   K 3.5 10/09/2021   CL 99 10/09/2021   CO2 26 10/09/2021    Lab Results  Component Value Date   ALT 11 10/09/2021   AST 14 (L) 10/09/2021   ALKPHOS 89 10/09/2021   BILITOT 1.1 10/09/2021      Microbiology: Recent Results (from the past 240 hour(s))  Blood culture (routine x 2)     Status: Abnormal   Collection Time: 10/05/21  2:53 AM   Specimen: BLOOD  Result Value Ref Range Status   Specimen Description BLOOD RIGHT ARM  Final   Special Requests   Final    BOTTLES DRAWN AEROBIC AND ANAEROBIC Blood Culture results may not be optimal due to an inadequate volume of blood received in culture bottles   Culture  Setup Time   Final    GRAM NEGATIVE RODS IN BOTH AEROBIC AND ANAEROBIC BOTTLES CRITICAL VALUE NOTED.  VALUE IS CONSISTENT WITH PREVIOUSLY REPORTED AND CALLED VALUE.    Culture (A)  Final    ESCHERICHIA COLI SUSCEPTIBILITIES PERFORMED ON PREVIOUS CULTURE WITHIN THE LAST 5 DAYS. Performed at [facility], [place] [address] [place], [place] [address]    Report Status 10/07/2021 FINAL  Final  Blood culture (routine x 2)     Status: Abnormal   Collection Time: 10/05/21  2:53 AM   Specimen:  BLOOD  Result Value Ref Range Status   Specimen Description BLOOD LEFT HAND  Final   Special Requests   Final    BOTTLES DRAWN AEROBIC AND ANAEROBIC Blood Culture results may not be optimal due to an inadequate volume of blood received in culture bottles   Culture  Setup Time   Final    GRAM NEGATIVE RODS IN BOTH AEROBIC AND ANAEROBIC BOTTLES CRITICAL RESULT CALLED TO, READ BACK BY AND VERIFIED WITH: PHARMD K.AMEND AT 1636 ON 10/05/2021 BY T.SAAD. Performed  at [facility], [place] [address], [place], [place] [address]    Culture ESCHERICHIA COLI (A)  Final   Report Status 10/07/2021 FINAL  Final   Organism ID, Bacteria ESCHERICHIA COLI  Final      Susceptibility   Escherichia coli - MIC*    AMPICILLIN >=32 RESISTANT Resistant     CEFAZOLIN <=4 SENSITIVE Sensitive     CEFEPIME <=0.12 SENSITIVE Sensitive     CEFTAZIDIME <=1 SENSITIVE Sensitive     CEFTRIAXONE <=0.25 SENSITIVE Sensitive     CIPROFLOXACIN <=0.25 SENSITIVE Sensitive     GENTAMICIN <=1 SENSITIVE Sensitive     IMIPENEM <=0.25 SENSITIVE Sensitive     TRIMETH/SULFA <=20 SENSITIVE Sensitive     AMPICILLIN/SULBACTAM 16 INTERMEDIATE Intermediate     PIP/TAZO <=4 SENSITIVE Sensitive     * ESCHERICHIA COLI  SARS Coronavirus 2 by RT PCR (hospital order, performed in [place] hospital lab) *cepheid single result test* Anterior Nasal Swab     Status: None   Collection Time: 10/05/21  2:53 AM   Specimen: Anterior Nasal Swab  Result Value Ref Range Status   SARS Coronavirus 2 by RT PCR NEGATIVE NEGATIVE Final    Comment: (NOTE) SARS-CoV-2 target nucleic acids are NOT DETECTED.  The SARS-CoV-2 RNA is generally detectable in upper and lower respiratory specimens during the acute phase of infection. The lowest concentration of SARS-CoV-2 viral copies this assay can detect is 250 copies / mL. A negative result does not preclude SARS-CoV-2 infection and should not be used as the sole basis for treatment or other patient  management decisions.  A negative result may occur with improper specimen collection / handling, submission of specimen other than nasopharyngeal swab, presence of viral mutation(s) within the areas targeted by this assay, and inadequate number of viral copies (<250 copies / mL). A negative result must be combined with clinical observations, patient history, and epidemiological information.  Fact Sheet for Patients:   https://www.patel.info/  Fact Sheet for Healthcare Providers: https://hall.com/  This test is not yet approved or  cleared by the [place] FDA and has been authorized for detection and/or diagnosis of SARS-CoV-2 by FDA under an Emergency Use Authorization (EUA).  This EUA will remain in effect (meaning this test can be used) for the duration of the COVID-19 declaration under Section 564(b)(1) of the Act, 21 U.S.C. section 360bbb-3(b)(1), unless the authorization is terminated or revoked sooner.  Performed at [facility], [place] [address], [place], [place] [address]   Blood Culture ID Panel (Reflexed)     Status: Abnormal   Collection Time: 10/05/21  2:53 AM  Result Value Ref Range Status   Enterococcus faecalis NOT DETECTED NOT DETECTED Final   Enterococcus Faecium NOT DETECTED NOT DETECTED Final   Listeria monocytogenes NOT DETECTED NOT DETECTED Final   Staphylococcus species NOT DETECTED NOT DETECTED Final   Staphylococcus aureus (BCID) NOT DETECTED NOT DETECTED Final   Staphylococcus epidermidis NOT DETECTED NOT DETECTED Final   Staphylococcus lugdunensis NOT DETECTED NOT DETECTED Final   Streptococcus species NOT DETECTED NOT DETECTED Final   Streptococcus agalactiae NOT DETECTED NOT DETECTED Final   Streptococcus pneumoniae NOT DETECTED NOT DETECTED Final   Streptococcus pyogenes NOT DETECTED NOT DETECTED Final   A.calcoaceticus-baumannii NOT DETECTED NOT DETECTED Final   Bacteroides fragilis NOT DETECTED  NOT DETECTED Final   Enterobacterales DETECTED (A) NOT DETECTED Final    Comment: Enterobacterales represent a large order of gram negative bacteria, not a single organism. CRITICAL  RESULT CALLED TO, READ BACK BY AND VERIFIED WITH: PHARMD K.AMEND AT 1636 ON 10/05/2021 BY T.SAAD.    Enterobacter cloacae complex NOT DETECTED NOT DETECTED Final   Escherichia coli DETECTED (A) NOT DETECTED Final    Comment: CRITICAL RESULT CALLED TO, READ BACK BY AND VERIFIED WITH: PHARMD K.AMEND AT 1636 ON 10/05/2021 BY T.SAAD.    Klebsiella aerogenes NOT DETECTED NOT DETECTED Final   Klebsiella oxytoca NOT DETECTED NOT DETECTED Final   Klebsiella pneumoniae NOT DETECTED NOT DETECTED Final   Proteus species NOT DETECTED NOT DETECTED Final   Salmonella species NOT DETECTED NOT DETECTED Final   Serratia marcescens NOT DETECTED NOT DETECTED Final   Haemophilus influenzae NOT DETECTED NOT DETECTED Final   Neisseria meningitidis NOT DETECTED NOT DETECTED Final   Pseudomonas aeruginosa NOT DETECTED NOT DETECTED Final   Stenotrophomonas maltophilia NOT DETECTED NOT DETECTED Final   Candida albicans NOT DETECTED NOT DETECTED Final   Candida auris NOT DETECTED NOT DETECTED Final   Candida glabrata NOT DETECTED NOT DETECTED Final   Candida krusei NOT DETECTED NOT DETECTED Final   Candida parapsilosis NOT DETECTED NOT DETECTED Final   Candida tropicalis NOT DETECTED NOT DETECTED Final   Cryptococcus neoformans/gattii NOT DETECTED NOT DETECTED Final   CTX-M ESBL NOT DETECTED NOT DETECTED Final   Carbapenem resistance IMP NOT DETECTED NOT DETECTED Final   Carbapenem resistance KPC NOT DETECTED NOT DETECTED Final   Carbapenem resistance NDM NOT DETECTED NOT DETECTED Final   Carbapenem resist OXA 48 LIKE NOT DETECTED NOT DETECTED Final   Carbapenem resistance VIM NOT DETECTED NOT DETECTED Final    Comment: Performed at [facility], [address], [place], [place] [address]  Urine Culture     Status: Abnormal    Collection Time: 10/05/21  3:11 AM   Specimen: Urine, Clean Catch  Result Value Ref Range Status   Specimen Description URINE, CLEAN CATCH  Final   Special Requests   Final    NONE Performed at [facility], [place] [address], [place], [place] [address]    Culture >=100,000 COLONIES/mL ESCHERICHIA COLI (A)  Final   Report Status 10/07/2021 FINAL  Final   Organism ID, Bacteria ESCHERICHIA COLI (A)  Final      Susceptibility   Escherichia coli - MIC*    AMPICILLIN >=32 RESISTANT Resistant     CEFAZOLIN <=4 SENSITIVE Sensitive     CEFEPIME <=0.12 SENSITIVE Sensitive     CEFTRIAXONE <=0.25 SENSITIVE Sensitive     CIPROFLOXACIN <=0.25 SENSITIVE Sensitive     GENTAMICIN <=1 SENSITIVE Sensitive     IMIPENEM <=0.25 SENSITIVE Sensitive     NITROFURANTOIN <=16 SENSITIVE Sensitive     TRIMETH/SULFA <=20 SENSITIVE Sensitive     AMPICILLIN/SULBACTAM 16 INTERMEDIATE Intermediate     PIP/TAZO <=4 SENSITIVE Sensitive     * >=100,000 COLONIES/mL ESCHERICHIA COLI  MRSA Next Gen by PCR, Nasal     Status: None   Collection Time: 10/05/21  6:01 AM   Specimen: Nasal Mucosa; Nasal Swab  Result Value Ref Range Status   MRSA by PCR Next Gen NOT DETECTED NOT DETECTED Final    Comment: (NOTE) The GeneXpert MRSA Assay (FDA approved for NASAL specimens only), is one component of a comprehensive MRSA colonization surveillance program. It is not intended to diagnose MRSA infection nor to guide or monitor treatment for MRSA infections. Test performance is not FDA approved in patients less than 45 years old. Performed at [facility], [place]  [address] [place], [place] [address]      Serology:   Imaging: If present, new imagings (plain films, ct scans, and mri) have been personally visualized and interpreted; radiology reports have been reviewed. Decision making incorporated into the Impression / Recommendations.   6/20 ct abd pelv without contrast Persistent findings of subcapsular gas in  the right kidney although the collections appear smaller there has been some extension into the Perirenal fatty tissues which is new from the prior exam.   Scattered lymph nodes as described similar to that seen on the prior exam.   Significant increase in the degree of infiltrate with associated effusions in the bases bilaterally.         6/18 ct abd pelv without contrast 1. Subcapsular and parenchymal gas collections within the right kidney. Unless there is been recent biopsy or other procedure, this is likely to represent infection with gas-forming organism. Inflammatory stranding suggested along the right pericolic gutter. 2. 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 3. Aortic atherosclerosis.   6/17 cxr No active cardiopulmonary disease.  Jabier Mutton, [facility] [place] [phone] pager    10/09/2021, 1:50 PM

## 2021-10-09 NOTE — Progress Notes (Signed)
Initial Nutrition Assessment  DOCUMENTATION CODES:   Not applicable  INTERVENTION:   Ensure Enlive po TID, each supplement provides 350 kcal and 20 grams of protein.  Continue Regular diet.  Add MVI with minerals daily.  NUTRITION DIAGNOSIS:   Inadequate oral intake related to acute illness, nausea, vomiting, decreased appetite as evidenced by meal completion < 25%.  GOAL:   Patient will meet greater than or equal to 90% of their needs  MONITOR:   PO intake, Supplement acceptance  REASON FOR ASSESSMENT:   Consult Assessment of nutrition requirement/status  ASSESSMENT:   56 yo female admitted with emphysematous pyelonephritis. PMH includes CAD, DM, HTN, NASH, ischemic cardiomyopathy, GERD, HLD.   Per review of chart, patient has been eating poorly for approximately one week PTA d/t nausea, vomiting, diarrhea, loss of appetite, pain, and SOB. She was unable to keep anything down.   Patient working with Physical Therapist during RD visit. Unable to speak with patient or complete NFPE. She is currently on a regular diet. PO intake of meals is very poor. Meal intakes documented at 0-10%. She would benefit from PO supplements to maximize oral intake of protein and calories.  Labs reviewed.  CBG: [phone]  Medications reviewed and include Lasix, Novolog, Semglee, Protonix, Miralax, Senokot-S.  No recent weight history available for review.   LBM documented as PTA. Senokot-S and Miralax both documented as refused this morning.  NUTRITION - FOCUSED PHYSICAL EXAM:  Unable to complete  Diet Order:   Diet Order             Diet regular Room service appropriate? Yes; Fluid consistency: Thin  Diet effective now                   EDUCATION NEEDS:   Not appropriate for education at this time  Skin:  Skin Assessment: Reviewed RN Assessment  Last BM:  PTA  Height:   Ht Readings from Last 1 Encounters:  10/06/21 5' 2.5" (1.588 m)    Weight:   Wt  Readings from Last 1 Encounters:  10/08/21 74.9 kg    Ideal Body Weight:  51.1 kg  BMI:  Body mass index is 29.72 kg/m.  Estimated Nutritional Needs:   Kcal:  1900-2100  Protein:  90-100 gm  Fluid:  1.9-2.1 L    Lucas Mallow RD, LDN, CNSC Please refer to Amion for contact information.

## 2021-10-09 NOTE — Progress Notes (Signed)
PROGRESS NOTE    [name]  RWE:[id number] DOB: [birth year] DOA: 10/04/2021 PCP: [facility] Dept Personal   Brief Narrative: [name] is a 56 y.o. female with a history of with inferior STEMI in 2015, insulin-dependent diabetes mellitus, hypertension, and NASH. Patient presented secondary to nausea, vomiting, generalized aches and was found to be in septic shock from right emphysematous pyelonephritis. Patient empirically treated with Ceftriaxone and admitted to the ICU for vasopressor support after initial resuscitation with IV fluids failed. Urine and blood cultures were positive for E. Coli. Antibiotics transitioned to Cefazolin prior to transitioning back to Ceftriaxone.    Assessment and Plan:  Septic shock Present on admission. Secondary to pyelonephritis and E. Coli bacteremia. Patient required ICU on admission and norepinephrine for vasopressor support in addition to midodrine. Patient managed empirically on Ceftriaxone and transitioned to Cefazolin. Resolved.  Right emphysematous pyelonephritis E. Coli bacteremia CT imaging on admission (6/18) was significant for gas collections within the right kidney concerning for emphysematous pyelonephritis. Blood and urine cultures positive for E. Coli bacteria. ID consulted. Patient is on Ancef for management. -ID recommendations: Ceftriaxone IV  Acute respiratory failure with hypoxia Secondary to pulmonary edema. Patient initially requiring BiPAP for management. Weaned to nasal canula currently. -Wean to room air as able -Ambulatory pulse ox prior to discharge -Repeat chest x-ray  Acute pulmonary edema Secondary to acute heart failure.  Acute on chronic systolic heart failure LVEF 40-45% on 6/19 which is slightly worse from previous baseline. Patient is on lisinopril as an outpatient. Acute failure likely secondary fluid resuscitation. Started on Lasix IV. Initial documented weight of 78.1 kg (172.18  lbs). Weight today of 74.9 kg (165.12 lbs). UOP of 2,825 mL over the last 24 hours. -Daily weights and strict in/out -Continue Lasix IV  AKI Secondary to ATN from septic shock and resultant hypotension. Peak creatinine of 3.07 which has now trended back down to baseline.  High anion gap metabolic acidosis Likely secondary to AKI. Resolved with resolution of AKI.  Hypokalemia Treated with potassium supplementation. Resolved.  Thrombocytopenia Unknown baseline. Possibly secondary to acute illness if acute vs NASH cirrhosis if chronic. Patient has received 3 units of platelets to date.  Hypoglycemia In setting of sepsis.  NASH Cirrhosis Upper abdominal varices Patient with along history of NASH, now with evidence of cirrhosis on CT imaging. Patient with a remote drinking history but not significant per report. Recent weight loss. Patient will need Gi follow-up.  Diabetes mellitus, type 2 Diabetic neuropathy Patient is on insulin glargine 30 units qHS and metformin as an outpatient. She is on gabapentin 1,200 mg qHS for neuropathy. Hemoglobin A1C of 12.5% -Continue Semglee 15 units BID and SSI with meals  Hypocalcemia Secondary to hypoalbuminemia. Corrected calcium is wnl.   DVT prophylaxis: SCDs Code Status:   Code Status: Full Code Family Communication: Daughter at bedside Disposition Plan: Discharge home with home health once ID recommendations for outpatient antibiotics   Consultants:  PCCM Urology Infectious disease  Procedures:    Antimicrobials: Ceftriaxone IV Meropenem IV Cefazolin IV    Subjective: Patient reports pain in hands and feet described as burning/tingling.  Objective: BP (!) 111/57 (BP Location: Left Arm)   Pulse 80   Temp 98.5 F (36.9 C) (Oral)   Resp 15   Ht 5' 2.5" (1.588 m)   Wt 74.9 kg   SpO2 90%   BMI 29.72 kg/m   Examination:  General exam: Appears calm and comfortable Respiratory system: Clear  to auscultation. Respiratory  effort normal. Cardiovascular system: S1 & S2 heard, RRR. No murmur Gastrointestinal system: Abdomen is nondistended, soft and nontender. Normal bowel sounds heard. Central nervous system: Alert and oriented. No focal neurological deficits. Musculoskeletal: No calf tenderness Psychiatry: Judgement and insight appear normal. Mood & affect appropriate.    Data Reviewed: I have personally reviewed following labs and imaging studies  CBC Lab Results  Component Value Date   WBC 10.3 10/08/2021   RBC 3.10 (L) 10/08/2021   HGB 10.1 (L) 10/08/2021   HCT 28.2 (L) 10/08/2021   MCV 91.0 10/08/2021   MCH 32.6 10/08/2021   PLT 16 (LL) 10/08/2021   MCHC 35.8 10/08/2021   RDW 14.7 10/08/2021   LYMPHSABS 1.0 10/08/2021   MONOABS 0.3 10/08/2021   EOSABS 0.1 10/08/2021   BASOSABS 0.0 34/28/7681     Last metabolic panel Lab Results  Component Value Date   NA 135 10/09/2021   K 3.5 10/09/2021   CL 99 10/09/2021   CO2 26 10/09/2021   BUN 47 (H) 10/09/2021   CREATININE 1.00 10/09/2021   GLUCOSE 140 (H) 10/09/2021   GFRNONAA >60 10/09/2021   GFRAA >90 03/06/2014   CALCIUM 8.0 (L) 10/09/2021   PHOS 2.7 10/07/2021   PROT 5.1 (L) 10/09/2021   ALBUMIN 1.9 (L) 10/09/2021   BILITOT 1.1 10/09/2021   ALKPHOS 89 10/09/2021   AST 14 (L) 10/09/2021   ALT 11 10/09/2021   ANIONGAP 10 10/09/2021    GFR: Estimated Creatinine Clearance: 60.2 mL/min (by C-G formula based on SCr of 1 mg/dL).  Recent Results (from the past 240 hour(s))  Blood culture (routine x 2)     Status: Abnormal   Collection Time: 10/05/21  2:53 AM   Specimen: BLOOD  Result Value Ref Range Status   Specimen Description BLOOD RIGHT ARM  Final   Special Requests   Final    BOTTLES DRAWN AEROBIC AND ANAEROBIC Blood Culture results may not be optimal due to an inadequate volume of blood received in culture bottles   Culture  Setup Time   Final    GRAM NEGATIVE RODS IN BOTH AEROBIC AND ANAEROBIC BOTTLES CRITICAL VALUE NOTED.   VALUE IS CONSISTENT WITH PREVIOUSLY REPORTED AND CALLED VALUE.    Culture (A)  Final    ESCHERICHIA COLI SUSCEPTIBILITIES PERFORMED ON PREVIOUS CULTURE WITHIN THE LAST 5 DAYS. Performed at [facility], [place] [address], [place], [place] [address]    Report Status 10/07/2021 FINAL  Final  Blood culture (routine x 2)     Status: Abnormal   Collection Time: 10/05/21  2:53 AM   Specimen: BLOOD  Result Value Ref Range Status   Specimen Description BLOOD LEFT HAND  Final   Special Requests   Final    BOTTLES DRAWN AEROBIC AND ANAEROBIC Blood Culture results may not be optimal due to an inadequate volume of blood received in culture bottles   Culture  Setup Time   Final    GRAM NEGATIVE RODS IN BOTH AEROBIC AND ANAEROBIC BOTTLES CRITICAL RESULT CALLED TO, READ BACK BY AND VERIFIED WITH: PHARMD K.AMEND AT 1636 ON 10/05/2021 BY T.SAAD. Performed at [facility], [place] [address] [place], [place] [address]    Culture ESCHERICHIA COLI (A)  Final   Report Status 10/07/2021 FINAL  Final   Organism ID, Bacteria ESCHERICHIA COLI  Final      Susceptibility   Escherichia coli - MIC*    AMPICILLIN >=32 RESISTANT Resistant  CEFAZOLIN <=4 SENSITIVE Sensitive     CEFEPIME <=0.12 SENSITIVE Sensitive     CEFTAZIDIME <=1 SENSITIVE Sensitive     CEFTRIAXONE <=0.25 SENSITIVE Sensitive     CIPROFLOXACIN <=0.25 SENSITIVE Sensitive     GENTAMICIN <=1 SENSITIVE Sensitive     IMIPENEM <=0.25 SENSITIVE Sensitive     TRIMETH/SULFA <=20 SENSITIVE Sensitive     AMPICILLIN/SULBACTAM 16 INTERMEDIATE Intermediate     PIP/TAZO <=4 SENSITIVE Sensitive     * ESCHERICHIA COLI  SARS Coronavirus 2 by RT PCR (hospital order, performed in [place] hospital lab) *cepheid single result test* Anterior Nasal Swab     Status: None   Collection Time: 10/05/21  2:53 AM   Specimen: Anterior Nasal Swab  Result Value Ref Range Status   SARS Coronavirus 2 by RT PCR NEGATIVE NEGATIVE Final    Comment:  (NOTE) SARS-CoV-2 target nucleic acids are NOT DETECTED.  The SARS-CoV-2 RNA is generally detectable in upper and lower respiratory specimens during the acute phase of infection. The lowest concentration of SARS-CoV-2 viral copies this assay can detect is 250 copies / mL. A negative result does not preclude SARS-CoV-2 infection and should not be used as the sole basis for treatment or other patient management decisions.  A negative result may occur with improper specimen collection / handling, submission of specimen other than nasopharyngeal swab, presence of viral mutation(s) within the areas targeted by this assay, and inadequate number of viral copies (<250 copies / mL). A negative result must be combined with clinical observations, patient history, and epidemiological information.  Fact Sheet for Patients:   https://www.patel.info/  Fact Sheet for Healthcare Providers: https://hall.com/  This test is not yet approved or  cleared by the [place] FDA and has been authorized for detection and/or diagnosis of SARS-CoV-2 by FDA under an Emergency Use Authorization (EUA).  This EUA will remain in effect (meaning this test can be used) for the duration of the COVID-19 declaration under Section 564(b)(1) of the Act, 21 U.S.C. section 360bbb-3(b)(1), unless the authorization is terminated or revoked sooner.  Performed at [facility], [place] [address], [place], [place] [address]   Blood Culture ID Panel (Reflexed)     Status: Abnormal   Collection Time: 10/05/21  2:53 AM  Result Value Ref Range Status   Enterococcus faecalis NOT DETECTED NOT DETECTED Final   Enterococcus Faecium NOT DETECTED NOT DETECTED Final   Listeria monocytogenes NOT DETECTED NOT DETECTED Final   Staphylococcus species NOT DETECTED NOT DETECTED Final   Staphylococcus aureus (BCID) NOT DETECTED NOT DETECTED Final   Staphylococcus epidermidis NOT DETECTED NOT  DETECTED Final   Staphylococcus lugdunensis NOT DETECTED NOT DETECTED Final   Streptococcus species NOT DETECTED NOT DETECTED Final   Streptococcus agalactiae NOT DETECTED NOT DETECTED Final   Streptococcus pneumoniae NOT DETECTED NOT DETECTED Final   Streptococcus pyogenes NOT DETECTED NOT DETECTED Final   A.calcoaceticus-baumannii NOT DETECTED NOT DETECTED Final   Bacteroides fragilis NOT DETECTED NOT DETECTED Final   Enterobacterales DETECTED (A) NOT DETECTED Final    Comment: Enterobacterales represent a large order of gram negative bacteria, not a single organism. CRITICAL RESULT CALLED TO, READ BACK BY AND VERIFIED WITH: PHARMD K.AMEND AT 1636 ON 10/05/2021 BY T.SAAD.    Enterobacter cloacae complex NOT DETECTED NOT DETECTED Final   Escherichia coli DETECTED (A) NOT DETECTED Final    Comment: CRITICAL RESULT CALLED TO, READ BACK BY AND VERIFIED WITH: PHARMD K.AMEND AT 1636 ON 10/05/2021 BY T.SAAD.  Klebsiella aerogenes NOT DETECTED NOT DETECTED Final   Klebsiella oxytoca NOT DETECTED NOT DETECTED Final   Klebsiella pneumoniae NOT DETECTED NOT DETECTED Final   Proteus species NOT DETECTED NOT DETECTED Final   Salmonella species NOT DETECTED NOT DETECTED Final   Serratia marcescens NOT DETECTED NOT DETECTED Final   Haemophilus influenzae NOT DETECTED NOT DETECTED Final   Neisseria meningitidis NOT DETECTED NOT DETECTED Final   Pseudomonas aeruginosa NOT DETECTED NOT DETECTED Final   Stenotrophomonas maltophilia NOT DETECTED NOT DETECTED Final   Candida albicans NOT DETECTED NOT DETECTED Final   Candida auris NOT DETECTED NOT DETECTED Final   Candida glabrata NOT DETECTED NOT DETECTED Final   Candida krusei NOT DETECTED NOT DETECTED Final   Candida parapsilosis NOT DETECTED NOT DETECTED Final   Candida tropicalis NOT DETECTED NOT DETECTED Final   Cryptococcus neoformans/gattii NOT DETECTED NOT DETECTED Final   CTX-M ESBL NOT DETECTED NOT DETECTED Final   Carbapenem resistance  IMP NOT DETECTED NOT DETECTED Final   Carbapenem resistance KPC NOT DETECTED NOT DETECTED Final   Carbapenem resistance NDM NOT DETECTED NOT DETECTED Final   Carbapenem resist OXA 48 LIKE NOT DETECTED NOT DETECTED Final   Carbapenem resistance VIM NOT DETECTED NOT DETECTED Final    Comment: Performed at [facility], [address], [place], [place] [address]  Urine Culture     Status: Abnormal   Collection Time: 10/05/21  3:11 AM   Specimen: Urine, Clean Catch  Result Value Ref Range Status   Specimen Description URINE, CLEAN CATCH  Final   Special Requests   Final    NONE Performed at [facility], [place] [address] [place], [place] [address]    Culture >=100,000 COLONIES/mL ESCHERICHIA COLI (A)  Final   Report Status 10/07/2021 FINAL  Final   Organism ID, Bacteria ESCHERICHIA COLI (A)  Final      Susceptibility   Escherichia coli - MIC*    AMPICILLIN >=32 RESISTANT Resistant     CEFAZOLIN <=4 SENSITIVE Sensitive     CEFEPIME <=0.12 SENSITIVE Sensitive     CEFTRIAXONE <=0.25 SENSITIVE Sensitive     CIPROFLOXACIN <=0.25 SENSITIVE Sensitive     GENTAMICIN <=1 SENSITIVE Sensitive     IMIPENEM <=0.25 SENSITIVE Sensitive     NITROFURANTOIN <=16 SENSITIVE Sensitive     TRIMETH/SULFA <=20 SENSITIVE Sensitive     AMPICILLIN/SULBACTAM 16 INTERMEDIATE Intermediate     PIP/TAZO <=4 SENSITIVE Sensitive     * >=100,000 COLONIES/mL ESCHERICHIA COLI  MRSA Next Gen by PCR, Nasal     Status: None   Collection Time: 10/05/21  6:01 AM   Specimen: Nasal Mucosa; Nasal Swab  Result Value Ref Range Status   MRSA by PCR Next Gen NOT DETECTED NOT DETECTED Final    Comment: (NOTE) The GeneXpert MRSA Assay (FDA approved for NASAL specimens only), is one component of a comprehensive MRSA colonization surveillance program. It is not intended to diagnose MRSA infection nor to guide or monitor treatment for MRSA infections. Test performance is not FDA approved in patients less than 74  years old. Performed at [facility], [place] [address], [place], [place] [address]       Radiology Studies: No results found.    LOS: 4 days    Cordelia Poche, MD [facility] 10/09/2021, 7:41 AM   If 7PM-7AM, please contact night-coverage [email or web address]

## 2021-10-10 DIAGNOSIS — J9601 Acute respiratory failure with hypoxia: Secondary | ICD-10-CM | POA: Diagnosis not present

## 2021-10-10 DIAGNOSIS — A419 Sepsis, unspecified organism: Secondary | ICD-10-CM | POA: Diagnosis not present

## 2021-10-10 DIAGNOSIS — N179 Acute kidney failure, unspecified: Secondary | ICD-10-CM | POA: Diagnosis not present

## 2021-10-10 DIAGNOSIS — K7469 Other cirrhosis of liver: Secondary | ICD-10-CM | POA: Diagnosis not present

## 2021-10-10 DIAGNOSIS — N12 Tubulo-interstitial nephritis, not specified as acute or chronic: Secondary | ICD-10-CM | POA: Diagnosis not present

## 2021-10-10 DIAGNOSIS — R6520 Severe sepsis without septic shock: Secondary | ICD-10-CM | POA: Diagnosis not present

## 2021-10-10 LAB — GLUCOSE, CAPILLARY
Glucose-Capillary: 189 mg/dL — ABNORMAL HIGH (ref 70–99)
Glucose-Capillary: 243 mg/dL — ABNORMAL HIGH (ref 70–99)
Glucose-Capillary: 309 mg/dL — ABNORMAL HIGH (ref 70–99)
Glucose-Capillary: 348 mg/dL — ABNORMAL HIGH (ref 70–99)
Glucose-Capillary: 331 mg/dL — ABNORMAL HIGH (ref 70–99)

## 2021-10-10 LAB — CBC
HCT: 27.5 % — ABNORMAL LOW (ref 36.0–46.0)
Hemoglobin: 9.6 g/dL — ABNORMAL LOW (ref 12.0–15.0)
MCH: 32.2 pg (ref 26.0–34.0)
MCHC: 34.9 g/dL (ref 30.0–36.0)
MCV: 92.3 fL (ref 80.0–100.0)
Platelets: 42 10*3/uL — ABNORMAL LOW (ref 150–400)
RBC: 2.98 MIL/uL — ABNORMAL LOW (ref 3.87–5.11)
RDW: 14.5 % (ref 11.5–15.5)
WBC: 17.1 10*3/uL — ABNORMAL HIGH (ref 4.0–10.5)
nRBC: 0.5 % — ABNORMAL HIGH (ref 0.0–0.2)

## 2021-10-10 NOTE — Progress Notes (Signed)
Physical Therapy Treatment Patient Details Name: [name] MRN: [id number] DOB: 1965/08/16 Today's Date: 10/10/2021   History of Present Illness Pt is a 56 y.o. female admitted 10/04/21 with c/o SOB, chest pain, N/V/D, loss of appetite. 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 PMH includes CAD, DM, fibromyalgia, neuropathy, ischemic cardiomyopathy, sinus bradycardia, NASH.   PT Comments    Pt progressing with mobility. Today's session focused on transfer and gait training with RW; pt able to perform hallway ambulation with min guard, intermittent seated rest breaks secondary to fatigue. Pt remains limited by generalized weakness, decreased activity tolerance, and impaired balance strategies/postural reactions. Will continue to follow acutely to address established goals.   SpO2 88-92% on RA with activity, HR 88    Recommendations for follow up therapy are one component of a multi-disciplinary discharge planning process, led by the attending physician.  Recommendations may be updated based on patient status, additional functional criteria and insurance authorization.  Follow Up Recommendations  Home health PT     Assistance Recommended at Discharge Intermittent Supervision/Assistance  Patient can return home with the following A little help with walking and/or transfers;A little help with bathing/dressing/bathroom;Assistance with cooking/housework;Assist for transportation;Help with stairs or ramp for entrance   Equipment Recommendations  Rolling walker (2 wheels)    Recommendations for Other Services       Precautions / Restrictions Precautions Precautions: Fall;Other (comment) Precaution Comments: Watch SpO2 (does not wear O2 at home) Restrictions Weight Bearing Restrictions: No     Mobility  Bed Mobility Overal bed mobility: Needs Assistance Bed Mobility: Supine to Sit      Supine to sit: Min assist, HOB elevated     General bed mobility comments: MinA for HHA to elevate trunk, able to scoot hips to EOB without assist, able to correct posterior LOB once sitting EOB    Transfers Overall transfer level: Needs assistance Equipment used: Rolling walker (2 wheels) Transfers: Sit to/from Stand Sit to Stand: Min guard           General transfer comment: multiple sit<>stands from EOB and recliner to RW, initially pt requiring 2-3 attempts and use of momentum to power up, progressing to 5x repeat sit<>stand to RW with min guard    Ambulation/Gait Ambulation/Gait assistance: Min guard Gait Distance (Feet): 34 Feet (+ 66') Assistive device: Rolling walker (2 wheels) Gait Pattern/deviations: Step-through pattern, Decreased stride length, Trunk flexed Gait velocity: Decreased     General Gait Details: slow, fatigued gait with RW and min guard for balance; chair follow from significant other to allow for 2x seated rest breaks secondary to fatigue; DOE 3/4   Stairs             Wheelchair Mobility    Modified Rankin (Stroke Patients Only)       Balance Overall balance assessment: Needs assistance Sitting-balance support: No upper extremity supported, Feet unsupported Sitting balance-Leahy Scale: Good     Standing balance support: During functional activity, Reliant on assistive device for balance Standing balance-Leahy Scale: Fair Standing balance comment: can static stand without UE support, unable to accept significant challenge                            Cognition Arousal/Alertness: Awake/alert Behavior During Therapy: WFL for tasks assessed/performed, Flat affect Overall Cognitive Status: Within Functional Limits for tasks assessed  Exercises Other Exercises Other Exercises: 5x repeated sit<>stand (reliant on UE support to push up), significant increased time     General Comments General comments (skin integrity, edema, etc.): pt's significant other present and supportive. SpO2 88-92% on RA with activity, HR 88      Pertinent Vitals/Pain Pain Assessment Pain Assessment: Faces Faces Pain Scale: Hurts little more Pain Location: bilateral feet, elbows, generalized Pain Descriptors / Indicators: Discomfort, Grimacing, Guarding Pain Intervention(s): Monitored during session, Limited activity within patient's tolerance    Home Living                          Prior Function            PT Goals (current goals can now be found in the care plan section) Progress towards PT goals: Progressing toward goals    Frequency    Min 4X/week      PT Plan Current plan remains appropriate    Co-evaluation              AM-PAC PT "6 Clicks" Mobility   Outcome Measure  Help needed turning from your back to your side while in a flat bed without using bedrails?: A Little Help needed moving from lying on your back to sitting on the side of a flat bed without using bedrails?: A Little Help needed moving to and from a bed to a chair (including a wheelchair)?: A Little Help needed standing up from a chair using your arms (e.g., wheelchair or bedside chair)?: A Little Help needed to walk in hospital room?: A Little Help needed climbing 3-5 steps with a railing? : A Little 6 Click Score: 18    End of Session Equipment Utilized During Treatment: Gait belt Activity Tolerance: Patient tolerated treatment well Patient left: in chair;with call bell/phone within reach;with family/visitor present Nurse Communication: Mobility status PT Visit Diagnosis: Other abnormalities of gait and mobility (R26.89);Muscle weakness (generalized) (M62.81);Pain     Time: 7829-5621 PT Time Calculation (min) (ACUTE ONLY): 30 min  Charges:  $Gait Training: 8-22 mins $Therapeutic Activity: 8-22 mins                     Ina Homes, PT, DPT Acute  Rehabilitation Services  Pager [phone] Office [phone]  Malachy Chamber 10/10/2021, 3:45 PM

## 2021-10-10 NOTE — TOC Initial Note (Addendum)
Transition of Care [facility]) - Initial/Assessment Note    Patient Details  Name: [name] MRN: [id number] Date of Birth: 1965/08/29  [facility] CM/SW Contact:    Kingsley Plan, RN Phone Number: 10/10/2021, 10:39 AM  Clinical Narrative:                 Spoke to patient and daughter at bedside.    Discussed PT recommendations for HHPT. DME TBD.  Patient and daughter in agreement for HHPT.   Patient does not have any DME at home.   Confirmed face sheet information .   Patient lives with boyfriend [name].   Discussed possible IV ABX at home. If needed infusion company will be [facility]. [facility] has a nurse Pam who would come to hospital and provide education to patient and family prior to discharge. Daughter stated [name] would help patient with IV ABX if IV ABX needed.   Patient would have HHRN but HHRN would not be there every time a dose is due.   If home oxygen needed will need ambulatory saturation note and order.Portable oxygen DME would be delivered to bedside prior to discharge.   Patient and daughter voiced understanding.   Agencies contacted for home health services:    Kandee Keen with [facility]  to accept referral.   [place]  with [facility] unable  to accept referral.     Victorino Dike  with  [facility] unable  to accept referral.   Elnita Maxwell with [facility]   unable  to accept referral.     [place] with  [facility] unable  to accept referral.      Misty Stanley with  [facility] unable  to accept referral.   Maralyn Sago  with [facility]  unable  to accept referral.   [facility] unable to accept referral.  [facility] unable to accept referral.  [facility] unable to accept referral.  Morrie Sheldon with [facility] able to accept referral and aware potential IV ABX .   Left Pam with [facility]  message   Will need MD orders   Expected Discharge Plan: Home w Home Health Services Barriers to Discharge: Continued Medical Work up   Patient Goals and CMS  Choice Patient states their goals for this hospitalization and ongoing recovery are:: to return to home CMS Medicare.gov Compare Post Acute Care list provided to:: Patient Choice offered to / list presented to : Patient  Expected Discharge Plan and Services Expected Discharge Plan: Home w Home Health Services   Discharge Planning Services: CM Consult Post Acute Care Choice: Home Health Living arrangements for the past 2 months: Single Family Home                   DME Agency: NA       HH Arranged: PT, RN HH Agency: [facility] Date HH Agency Contacted: 10/10/21 Time HH Agency Contacted: 1034 Representative spoke with at [facility] Agency: Morrie Sheldon , will need orders  Prior Living Arrangements/Services Living arrangements for the past 2 months: Single Family Home Lives with:: Significant Other Patient language and need for interpreter reviewed:: Yes Do you feel safe going back to the place where you live?: Yes      Need for Family Participation in Patient Care: Yes (Comment) Care giver support system in place?: Yes (comment)   Criminal Activity/Legal Involvement Pertinent to Current Situation/Hospitalization: No - Comment as needed  Activities of Daily Living      Permission Sought/Granted   Permission granted  to share information with : Yes, Verbal Permission Granted  Share Information with NAME: daughter [name]           Emotional Assessment Appearance:: Appears stated age Attitude/Demeanor/Rapport: Engaged Affect (typically observed): Accepting Orientation: : Oriented to Self, Oriented to Place, Oriented to  Time, Oriented to Situation Alcohol / Substance Use: Not Applicable Psych Involvement: No (comment)  Admission diagnosis:  Palpitations [R00.2] Thrombocytopenia (HCC) [D69.6] 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] AKI (acute kidney injury) (HCC) [N17.9] Septic shock (HCC) [A41.9, R65.21] Severe sepsis (HCC) [A41.9, R65.20] Urinary tract  infection without hematuria, site unspecified [N39.0] Patient Active Problem List   Diagnosis Date Noted   E coli bacteremia    Acute respiratory failure with hypoxia (HCC)    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   Thrombocytopenia (HCC) 10/05/2021   AKI (acute kidney injury) (HCC) 10/05/2021   Elevated troponin 10/05/2021   Acidosis 10/05/2021   Cirrhosis (HCC) 10/05/2021   Septic shock (HCC) 10/05/2021   Severe sepsis (HCC)    Urinary tract infection without hematuria    Depression 09/21/2014   Cough 09/19/2014   Dyspnea 05/10/2014   Angina decubitus (HCC) 03/28/2014   Memory loss 03/28/2014   Bleeding gums 03/28/2014   Hypotension 03/06/2014   Sinus bradycardia    Ischemic cardiomyopathy    CAD (coronary artery disease)    Hypokalemia 03/05/2014   Essential hypertension    ST elevation myocardial infarction (STEMI) involving right coronary artery with complication (HCC) 03/03/2014   Left rotator cuff tear 07/27/2013   Complete rotator cuff tear of left shoulder 07/27/2013   Otalgia 05/25/2013   Rotator cuff tear, non-traumatic 03/02/2013   Weakness generalized 09/06/2012   Fall 09/06/2012   Adjustment disorder with depressed mood 09/06/2012   Dizziness and giddiness 07/25/2012   Fibromyalgia 05/12/2012   Asplenia 05/12/2012   Diarrhea 01/07/2012   Nonalcoholic fatty liver disease 01/07/2012   GERD (gastroesophageal reflux disease) 09/15/2011   Leukocytosis 08/22/2011   Insulin-requiring or dependent type II diabetes mellitus (HCC) 08/06/2011   Hyperlipidemia    Lateral epicondylitis  of elbow 12/17/2008   PCP:  [facility] Personal Pharmacy:   [facility] [address], [place] - [address] [place] [address] [place] [address] Phone: [phone] Fax: [phone]  [facility] - [place], [place] - [address] [place] [address] Phone: [phone] Fax:  [phone]     Social Determinants of Health (SDOH) Interventions    Readmission Risk Interventions     No data to display

## 2021-10-11 ENCOUNTER — Inpatient Hospital Stay (HOSPITAL_COMMUNITY): Payer: Commercial Managed Care - HMO

## 2021-10-11 DIAGNOSIS — J9601 Acute respiratory failure with hypoxia: Secondary | ICD-10-CM | POA: Diagnosis not present

## 2021-10-11 DIAGNOSIS — K7469 Other cirrhosis of liver: Secondary | ICD-10-CM | POA: Diagnosis not present

## 2021-10-11 DIAGNOSIS — N179 Acute kidney failure, unspecified: Secondary | ICD-10-CM | POA: Diagnosis not present

## 2021-10-11 DIAGNOSIS — N12 Tubulo-interstitial nephritis, not specified as acute or chronic: Secondary | ICD-10-CM | POA: Diagnosis not present

## 2021-10-11 LAB — CBC
HCT: 24.9 % — ABNORMAL LOW (ref 36.0–46.0)
Hemoglobin: 8.7 g/dL — ABNORMAL LOW (ref 12.0–15.0)
MCH: 32.8 pg (ref 26.0–34.0)
MCHC: 34.9 g/dL (ref 30.0–36.0)
MCV: 94 fL (ref 80.0–100.0)
Platelets: 101 10*3/uL — ABNORMAL LOW (ref 150–400)
RBC: 2.65 MIL/uL — ABNORMAL LOW (ref 3.87–5.11)
RDW: 14.8 % (ref 11.5–15.5)
WBC: 19 10*3/uL — ABNORMAL HIGH (ref 4.0–10.5)
nRBC: 0.5 % — ABNORMAL HIGH (ref 0.0–0.2)

## 2021-10-11 LAB — GLUCOSE, CAPILLARY
Glucose-Capillary: 214 mg/dL — ABNORMAL HIGH (ref 70–99)
Glucose-Capillary: 250 mg/dL — ABNORMAL HIGH (ref 70–99)
Glucose-Capillary: 305 mg/dL — ABNORMAL HIGH (ref 70–99)
Glucose-Capillary: 333 mg/dL — ABNORMAL HIGH (ref 70–99)

## 2021-10-11 LAB — BASIC METABOLIC PANEL
Anion gap: 10 (ref 5–15)
BUN: 36 mg/dL — ABNORMAL HIGH (ref 6–20)
CO2: 26 mmol/L (ref 22–32)
Calcium: 8.3 mg/dL — ABNORMAL LOW (ref 8.9–10.3)
Chloride: 94 mmol/L — ABNORMAL LOW (ref 98–111)
Creatinine, Ser: 1.09 mg/dL — ABNORMAL HIGH (ref 0.44–1.00)
GFR, Estimated: 60 mL/min — ABNORMAL LOW (ref >60)
Glucose, Bld: 258 mg/dL — ABNORMAL HIGH (ref 70–99)
Potassium: 3.9 mmol/L (ref 3.5–5.1)
Sodium: 130 mmol/L — ABNORMAL LOW (ref 135–145)

## 2021-10-11 MED ORDER — IOHEXOL 300 MG/ML  SOLN
100.0000 mL | Freq: Once | INTRAMUSCULAR | Status: AC | PRN
  Administered 2021-10-11: 100 mL via INTRAVENOUS

## 2021-10-11 MED ORDER — IOHEXOL 9 MG/ML PO SOLN
ORAL | Status: AC
  Administered 2021-10-11: 500 mL
  Filled 2021-10-11: qty 1000

## 2021-10-12 ENCOUNTER — Encounter (HOSPITAL_COMMUNITY): Payer: Self-pay | Admitting: Internal Medicine

## 2021-10-12 DIAGNOSIS — K7469 Other cirrhosis of liver: Secondary | ICD-10-CM | POA: Diagnosis not present

## 2021-10-12 DIAGNOSIS — J9601 Acute respiratory failure with hypoxia: Secondary | ICD-10-CM | POA: Diagnosis not present

## 2021-10-12 DIAGNOSIS — N179 Acute kidney failure, unspecified: Secondary | ICD-10-CM | POA: Diagnosis not present

## 2021-10-12 DIAGNOSIS — N12 Tubulo-interstitial nephritis, not specified as acute or chronic: Secondary | ICD-10-CM | POA: Diagnosis not present

## 2021-10-12 LAB — CBC
HCT: 23.8 % — ABNORMAL LOW (ref 36.0–46.0)
Hemoglobin: 8.5 g/dL — ABNORMAL LOW (ref 12.0–15.0)
MCH: 32.9 pg (ref 26.0–34.0)
MCHC: 35.7 g/dL (ref 30.0–36.0)
MCV: 92.2 fL (ref 80.0–100.0)
Platelets: 150 10*3/uL (ref 150–400)
RBC: 2.58 MIL/uL — ABNORMAL LOW (ref 3.87–5.11)
RDW: 14.8 % (ref 11.5–15.5)
WBC: 20.7 10*3/uL — ABNORMAL HIGH (ref 4.0–10.5)
nRBC: 0.3 % — ABNORMAL HIGH (ref 0.0–0.2)

## 2021-10-12 LAB — GLUCOSE, CAPILLARY
Glucose-Capillary: 203 mg/dL — ABNORMAL HIGH (ref 70–99)
Glucose-Capillary: 297 mg/dL — ABNORMAL HIGH (ref 70–99)
Glucose-Capillary: 285 mg/dL — ABNORMAL HIGH (ref 70–99)
Glucose-Capillary: 233 mg/dL — ABNORMAL HIGH (ref 70–99)

## 2021-10-12 MED ORDER — INSULIN ASPART 100 UNIT/ML IJ SOLN
0.0000 [IU] | Freq: Three times a day (TID) | INTRAMUSCULAR | Status: DC
  Administered 2021-10-12 – 2021-10-13 (×3): 8 [IU] via SUBCUTANEOUS
  Administered 2021-10-13: 3 [IU] via SUBCUTANEOUS
  Administered 2021-10-13 – 2021-10-14 (×3): 5 [IU] via SUBCUTANEOUS

## 2021-10-12 NOTE — Consult Note (Signed)
Chief Complaint: Patient was seen in consultation today for emphysematous pyelonephritis  Referring Physician(s): Dr. Jacquelin Hawking  Supervising Physician: Irish Lack  Patient Status: [facility] - In-pt  History of Present Illness: [name] is a 56 y.o. female with past medical history of CAD with STEMI s/p heart cath, DM, fibromyalgia, GERD, neuropathy, prior splenectomy, NASH who presented to [facility] ED with nausea, vomiting, palpitations.  Patietn found to have sepsis related to emphysematous pyelonephritis.  She has been treated conservatively for several days. Urology and ID have also been following.   CT Abdomen Pelvis repeated overnight for worsening leukocytosis.    CT showed: 1. 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 Continued follow-up is recommended.   2. Heterogeneous perfusion of the left kidney suggesting pyelonephritis without evidence of air or fluid collection/abscess, suggesting non complicated left pyelonephritis.   ID have reviewed and discussed with Urology who recommended percutaneous drain placement.  Case reviewed by Dr. Fredia Sorrow who notes no focal abscesses with air component no worse compared to 6/20, however aspiration could be attempted.   Past Medical History:  Diagnosis Date   Anxiety    CAD (coronary artery disease)    a.  Inferior STEMI / CAD s/p DES to prox RCA 03/03/14, staged cath 03/05/14 for LAD showing significantly improved stenosis of mid LAD with resolution of prior thrombus.   Clostridium difficile colitis    Diabetes mellitus    Dyslipidemia    Fibromyalgia    GERD (gastroesophageal reflux disease)    H/O hiatal hernia    Ischemic cardiomyopathy    a. EF 45-50% by echo 03/04/14 with suspected mild acute diastolic CHF s/p dose of IV Lasix.   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   Sinus bradycardia    a. During 02/2014 admit, not on BB due to this.   Upper respiratory infection 2/15   states has dry non productive cough since then- "tickle in my throat" no fever    Past Surgical History:  Procedure Laterality Date   ABDOMINAL HYSTERECTOMY     partial   CARDIAC CATHETERIZATION  03/05/14   improved LAD   COLONOSCOPY  12/07/2011   Rourk-normal rectum,, colon and terminal ileum   CORONARY ANGIOPLASTY WITH STENT PLACEMENT  03/03/14   DES to RCA,  ulcerated plaque in LAD   ESOPHAGOGASTRODUODENOSCOPY  08/27/2011   Rourk-reactive gastropathy, normal, patent esophagus. Stomach empty. Multiple linear antral erosions. No ulcer or infiltrating process. Small hiatal hernia. Patent pylorus. Normal first and second portion of the duodenum   LEFT HEART CATH Bilateral 03/03/2014   Procedure: LEFT HEART CATH;  Surgeon: Peter M Swaziland, MD;  Location: [facility] CATH LAB;  Service: Cardiovascular;  Laterality: Bilateral;   PERCUTANEOUS CORONARY STENT INTERVENTION (PCI-S) N/A 03/05/2014   Procedure: PERCUTANEOUS CORONARY STENT INTERVENTION (PCI-S);  Surgeon: Lennette Bihari, MD;  Location: [facility] CATH LAB;  Service: Cardiovascular;  Laterality: N/A;   SHOULDER ARTHROSCOPY WITH OPEN ROTATOR CUFF REPAIR Left 03/02/2013   Procedure: LEFT SHOULDER ARTHROSCOPY, SUBACROMIAL DECOMPRESSION, MANIPULATION UNDER ANESTHESIA;  Surgeon: Javier Docker, MD;  Location: [facility] ORS;  Service: Orthopedics;  Laterality: Left;   SHOULDER OPEN ROTATOR CUFF REPAIR Left 07/27/2013   Procedure: LEFT MINI OPEN ROTATOR CUFF REPAIR ;  Surgeon: Javier Docker, MD;  Location: [facility] ORS;  Service: Orthopedics;  Laterality: Left;   SPLENECTOMY     TONSILLECTOMY      Allergies: Aspartame and phenylalanine, Toradol [ketorolac tromethamine], Sucralose, Codeine, Dilaudid [hydromorphone hcl], Hydrocodone, and  Oxycodone  Medications: Prior to Admission medications   Medication Sig Start Date End Date Taking? Authorizing Provider  albuterol (VENTOLIN HFA) 108 (90 Base) MCG/ACT inhaler Inhale 2 puffs into the lungs every 4 (four) hours as needed for shortness of breath or wheezing. 05/27/21  Yes [provider]  Cholecalciferol (VITAMIN D3) 1.25 MG (50000 UT) CAPS Take 50,000 Units by mouth every Sunday. 10/02/21  Yes [provider]  DULoxetine (CYMBALTA) 60 MG capsule Take 60 mg by mouth at bedtime. 09/16/21  Yes [provider]  famotidine (PEPCID) 20 MG tablet Take 20-40 mg by mouth daily as needed for heartburn or indigestion.   Yes [provider]  gabapentin (NEURONTIN) 600 MG tablet Take 1,200 mg by mouth at bedtime. 08/12/21  Yes [provider]  glimepiride (AMARYL) 4 MG tablet Take 4 mg by mouth daily. 10/02/21  Yes [provider]  HYDROcodone-acetaminophen (NORCO) 7.5-325 MG tablet Take 1 tablet by mouth every 6 (six) hours as needed for moderate pain.   Yes [provider]  ibuprofen (ADVIL) 200 MG tablet Take 800 mg by mouth every 6 (six) hours as needed for mild pain.   Yes [provider]  ibuprofen (ADVIL) 800 MG tablet Take 800 mg by mouth every 8 (eight) hours as needed for mild pain.   Yes [provider]  Insulin Glargine (BASAGLAR KWIKPEN) 100 UNIT/ML Inject 30 Units into the skin at bedtime.   Yes [provider]  lisinopril (ZESTRIL) 10 MG tablet Take 10 mg by mouth daily. 09/16/21  Yes [provider]  metFORMIN (GLUCOPHAGE) 1000 MG tablet Take 1,000 mg by mouth 2 (two) times daily. 09/07/21  Yes [provider]  Probiotic Product (PROBIOTIC PO) Take 1 capsule by mouth daily.   Yes [provider]  simvastatin (ZOCOR) 40 MG tablet Take 40 mg by mouth at bedtime. 09/14/21  Yes [provider]     Family History  Problem Relation Age of Onset   Arthritis Mother     Diabetes Father    Kidney disease Father    Colon cancer Neg Hx     Social History   Socioeconomic History   Marital status: Legally Separated    Spouse name: Not on file   Number of children: 2   Years of education: Not on file   Highest education level: Not on file  Occupational History   Occupation: homemaker  Tobacco Use   Smoking status: Former    Packs/day: 0.25    Years: 30.00    Total pack years: 7.50    Types: Cigarettes    Quit date: 04/20/2006    Years since quitting: 15.4   Smokeless tobacco: Never  Substance and Sexual Activity   Alcohol use: Yes    Alcohol/week: 0.0 standard drinks of alcohol    Comment: rarely   Drug use: No   Sexual activity: Yes    Birth control/protection: None, Surgical  Other Topics Concern   Not on file  Social History Narrative   Lives w/ husband & step daughter   Also has grandchildren from her son & daughter   Social Determinants of Health   Financial Resource Strain: Not on file  Food Insecurity: Not on file  Transportation Needs: Not on file  Physical  Activity: Not on file  Stress: Not on file  Social Connections: Not on file     Review of Systems: A 12 point ROS discussed and pertinent positives are indicated in the HPI above.  All other systems are negative.  Review of Systems  Constitutional:  Negative for fatigue and fever.  Respiratory:  Negative for cough and shortness of breath.   Cardiovascular:  Negative for chest pain.  Gastrointestinal:  Positive for abdominal pain. Negative for diarrhea, nausea and vomiting.  Musculoskeletal:  Positive for back pain.  Psychiatric/Behavioral:  Negative for behavioral problems and confusion.     Vital Signs: BP 93/66 (BP Location: Right Arm)   Pulse 73   Temp 98.1 F (36.7 C) (Oral)   Resp 16   Ht 5' 2.5" (1.588 m)   Wt 155 lb 6.8 oz (70.5 kg)   SpO2 96%   BMI 27.97 kg/m   Physical Exam Vitals and nursing note reviewed.  Constitutional:      General: She is  not in acute distress.    Appearance: She is well-developed.  Cardiovascular:     Rate and Rhythm: Normal rate and regular rhythm.  Pulmonary:     Effort: Pulmonary effort is normal.     Breath sounds: Normal breath sounds.  Genitourinary:    Comments: Foley in place Musculoskeletal:        General: Normal range of motion.  Skin:    General: Skin is warm and dry.  Neurological:     General: No focal deficit present.     Mental Status: She is alert and oriented to person, place, and time.  Psychiatric:        Mood and Affect: Mood normal.        Behavior: Behavior normal.          Imaging: CT ABDOMEN PELVIS W CONTRAST  Result Date: 10/11/2021 CLINICAL DATA:  Pyelonephritis scattered complicated emphysematous pyelonephritis. EXAM: CT ABDOMEN AND PELVIS WITH CONTRAST TECHNIQUE: Multidetector CT imaging of the abdomen and pelvis was performed using the standard protocol following bolus administration of intravenous contrast. RADIATION DOSE REDUCTION: This exam was performed according to the departmental dose-optimization program which includes automated exposure control, adjustment of the mA and/or kV according to patient size and/or use of iterative reconstruction technique. CONTRAST:  OMNIPAQUE IOHEXOL 300 MG/ML  SOLN COMPARISON:  CT examinations dated October 05, 2021 and October 07, 2021. FINDINGS: Lower chest: Small bilateral pleural effusions, right greater than the left. Hepatobiliary: No focal liver abnormality is seen. No gallstones, gallbladder wall thickening, or biliary dilatation. Pancreas: Unremarkable. No pancreatic ductal dilatation or surrounding inflammatory changes. Spleen: Surgically absent. Adrenals/Urinary Tract: Adrenal glands are unremarkable. Multifocal area of nonenhancement in the right kidney with small foci of air consistent with emphysematous pyelonephritis, there is interval decrease in amount of gas about the lateral and medial pole of the right kidney.  Nonenhancing area in the upper and midpole of the left kidney consistent with pyelonephritis. No appreciable emphysema. No perinephric fluid collection or abscess. Urinary bladder is collapsed about the [facility] catheter. Stomach/Bowel: Stomach is within normal limits. Appendix appears normal. No evidence of bowel wall thickening, distention, or inflammatory changes. Vascular/Lymphatic: Mild aortic atherosclerosis. No enlarged abdominal or pelvic lymph nodes. Reproductive: Prostate is unremarkable. Other: No abdominal wall hernia or abnormality. No abdominopelvic ascites. Musculoskeletal: No acute or significant osseous findings. IMPRESSION: 1. 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 Continued follow-up is recommended. 2. Heterogeneous perfusion of the left kidney suggesting pyelonephritis without evidence of air or fluid collection/abscess, suggesting non complicated left pyelonephritis. 3.  Urinary bladder is collapsed about the Foley's catheter. 4.  Additional chronic findings as above. Electronically Signed   By: Larose Hires D.O.   On: 10/11/2021 14:31   DG CHEST PORT 1 VIEW  Result Date: 10/09/2021 CLINICAL DATA:  Respiratory failure, hypoxia EXAM: PORTABLE CHEST 1 VIEW COMPARISON:  10/06/2021 FINDINGS: Interval improvement in diffuse bilateral interstitial and heterogeneous airspace disease, which is most conspicuous in the right midlung and left lung base. Cardiomegaly. IMPRESSION: Interval improvement in diffuse bilateral interstitial and heterogeneous airspace disease, most conspicuous in the right midlung and left lung base. Findings are consistent with improved edema and or infection. Electronically Signed   By: Jearld Lesch M.D.   On: 10/09/2021 08:34   CT ABDOMEN PELVIS WO CONTRAST  Result Date: 10/07/2021 CLINICAL DATA:  Emphysematous pyelonephritis,  follow-up exam EXAM: CT ABDOMEN AND PELVIS WITHOUT CONTRAST TECHNIQUE: Multidetector CT imaging of the abdomen and pelvis was performed following the standard protocol without IV contrast. RADIATION DOSE REDUCTION: This exam was performed according to the departmental dose-optimization program which includes automated exposure control, adjustment of the mA and/or kV according to patient size and/or use of iterative reconstruction technique. COMPARISON:  10/05/2021 FINDINGS: Lower chest: Bilateral pleural effusions are noted right greater than left. Additionally there is considerable increase consolidation in the lower lobes bilaterally as well as the right middle lobe and left lingula. Hepatobiliary: Gallbladder is within normal limits. Cirrhotic change of the liver is again identified. Pancreas: Unremarkable. No pancreatic ductal dilatation or surrounding inflammatory changes. Spleen: Surgically removed Adrenals/Urinary Tract: Adrenal glands are stable. Left kidney shows no calculi or obstructive changes. The right kidney again demonstrates subcapsular air collections which are smaller than that seen on the prior exam however there is now extension of air into the perirenal fat particularly medially. Again no obstructive changes are seen. Inflammatory changes extend along the right pericolic gutter. Bladder is decompressed by Foley catheter. Stomach/Bowel: Appendix is within normal limits. No obstructive or inflammatory changes of the colon are seen. Stomach and small bowel are within normal limits. Vascular/Lymphatic: Aortic atherosclerotic changes are noted. There are again seen lymph nodes in the region of the celiac axis, mesentery and periportal region scattered retroperitoneal nodes are again seen and stable. Reproductive: Status post hysterectomy. No adnexal masses. Other: New free fluid is noted within the pelvis likely reactive in nature. Musculoskeletal: No acute or significant osseous findings.  IMPRESSION: Persistent findings of subcapsular gas in the right kidney although the collections appear smaller there has been some extension into the Perirenal fatty tissues which is new from the prior exam. Scattered lymph nodes as described similar to that seen on the prior exam. Significant increase in the degree of infiltrate with associated effusions in the bases bilaterally. Electronically Signed   By: Alcide Clever M.D.   On: 10/07/2021 01:49   DG CHEST PORT 1 VIEW  Result Date: 10/06/2021 CLINICAL DATA:  Shortness of breath EXAM: PORTABLE CHEST 1 VIEW COMPARISON:  10/06/2021, 10/04/2021 FINDINGS: Extensive left greater than right pulmonary airspace disease grossly unchanged as compared with radiograph earlier today. Probable right pleural effusion. Obscured cardiomediastinal silhouette. No pneumothorax. IMPRESSION: 1. Grossly similar extensive left greater than right airspace disease which may be due to edema and or pneumonia 2. Suspect right pleural effusion Electronically Signed   By: Adrian Prows.D.  On: 10/06/2021 22:39   ECHOCARDIOGRAM COMPLETE  Result Date: 10/06/2021    ECHOCARDIOGRAM REPORT   Patient Name:   LATONGA WERTHEIM Date of Exam: 10/06/2021 Medical Rec #:  [id number]       Height:       62.5 in Accession #:    [id number]      Weight:       172.2 lb Date of Birth:  March 08, 1966       BSA:          1.805 m Patient Age:    55 years        BP:           100/55 mmHg Patient Gender: F               HR:           83 bpm. Exam Location:  Inpatient Procedure: 2D Echo, Cardiac Doppler and Color Doppler Indications:    Cardiomyopathy  History:        Patient has prior history of Echocardiogram examinations, most                 recent 03/04/2014. CAD; Risk Factors:Diabetes and Hypertension.  Sonographer:    Eduard Roux Referring Phys: [id number] Vinnie Level SMITH IMPRESSIONS  1. Left ventricular ejection fraction, by estimation, is 40 to 45%. The left ventricle has mildly decreased function. The  left ventricle has no regional wall motion abnormalities. Left ventricular diastolic parameters were normal.  2. Right ventricular systolic function is normal. The right ventricular size is normal. There is normal pulmonary artery systolic pressure.  3. Left atrial size was mildly dilated.  4. Right atrial size was mildly dilated.  5. The mitral valve is normal in structure. Trivial mitral valve regurgitation. No evidence of mitral stenosis.  6. The aortic valve is tricuspid. There is mild calcification of the aortic valve. Aortic valve regurgitation is not visualized. Aortic valve sclerosis/calcification is present, without any evidence of aortic stenosis.  7. The inferior vena cava is normal in size with greater than 50% respiratory variability, suggesting right atrial pressure of 3 mmHg. FINDINGS  Left Ventricle: Left ventricular ejection fraction, by estimation, is 40 to 45%. The left ventricle has mildly decreased function. The left ventricle has no regional wall motion abnormalities. The left ventricular internal cavity size was normal in size. There is no left ventricular hypertrophy. Left ventricular diastolic parameters were normal. Right Ventricle: The right ventricular size is normal. No increase in right ventricular wall thickness. Right ventricular systolic function is normal. There is normal pulmonary artery systolic pressure. The tricuspid regurgitant velocity is 2.15 m/s, and  with an assumed right atrial pressure of 15 mmHg, the estimated right ventricular systolic pressure is 33.5 mmHg. Left Atrium: Left atrial size was mildly dilated. Right Atrium: Right atrial size was mildly dilated. Pericardium: There is no evidence of pericardial effusion. Mitral Valve: The mitral valve is normal in structure. Trivial mitral valve regurgitation. No evidence of mitral valve stenosis. Tricuspid Valve: The tricuspid valve is normal in structure. Tricuspid valve regurgitation is trivial. No evidence of tricuspid  stenosis. Aortic Valve: The aortic valve is tricuspid. There is mild calcification of the aortic valve. Aortic valve regurgitation is not visualized. Aortic valve sclerosis/calcification is present, without any evidence of aortic stenosis. Aortic valve peak gradient measures 6.2 mmHg. Pulmonic Valve: The pulmonic valve was normal in structure. Pulmonic valve regurgitation is trivial. No evidence of pulmonic stenosis. Aorta: The aortic root is normal in  size and structure. Venous: The inferior vena cava is normal in size with greater than 50% respiratory variability, suggesting right atrial pressure of 3 mmHg. IAS/Shunts: The interatrial septum appears to be lipomatous. No atrial level shunt detected by color flow Doppler.  LEFT VENTRICLE PLAX 2D LVIDd:         4.60 cm   Diastology LVIDs:         3.80 cm   LV e' medial:    6.66 cm/s LV PW:         1.00 cm   LV E/e' medial:  11.7 LV IVS:        1.10 cm   LV e' lateral:   9.25 cm/s LVOT diam:     2.00 cm   LV E/e' lateral: 8.4 LV SV:         75 LV SV Index:   41 LVOT Area:     3.14 cm  RIGHT VENTRICLE            IVC RV Basal diam:  3.00 cm    IVC diam: 2.50 cm RV S prime:     9.07 cm/s TAPSE (M-mode): 2.2 cm LEFT ATRIUM             Index        RIGHT ATRIUM           Index LA diam:        3.50 cm 1.94 cm/m   RA Area:     17.60 cm LA Vol (A2C):   45.5 ml 25.21 ml/m  RA Volume:   53.20 ml  29.48 ml/m LA Vol (A4C):   48.0 ml 26.60 ml/m LA Biplane Vol: 47.3 ml 26.21 ml/m  AORTIC VALVE                 PULMONIC VALVE AV Area (Vmax): 2.76 cm     PV Vmax:       0.59 m/s AV Vmax:        124.00 cm/s  PV Peak grad:  1.4 mmHg AV Peak Grad:   6.2 mmHg LVOT Vmax:      109.00 cm/s LVOT Vmean:     73.000 cm/s LVOT VTI:       0.238 m  AORTA Ao Root diam: 3.10 cm Ao Asc diam:  3.20 cm MITRAL VALVE               TRICUSPID VALVE MV Area (PHT): 5.75 cm    TR Peak grad:   18.5 mmHg MV Decel Time: 132 msec    TR Vmax:        215.00 cm/s MV E velocity: 78.10 cm/s MV A velocity: 56.30  cm/s  SHUNTS MV E/A ratio:  1.39        Systemic VTI:  0.24 m                            Systemic Diam: 2.00 cm Arvilla Meres MD Electronically signed by Arvilla Meres MD Signature Date/Time: 10/06/2021/3:52:31 PM    Final    DG CHEST PORT 1 VIEW  Result Date: 10/06/2021 CLINICAL DATA:  Dyspnea, emphysematous pyelonephritis EXAM: PORTABLE CHEST 1 VIEW COMPARISON:  10/04/2021 FINDINGS: Transverse diameter of heart is increased. Diffuse alveolar densities seen in the both lungs, more so on the left side. There is blunting of both lateral CP angles. There is no pneumothorax. IMPRESSION: Diffuse increase in alveolar densities seen in both lungs, more so  on the left side suggesting pulmonary edema. Possibility of underlying pneumonia is not excluded. Small bilateral pleural effusions. Electronically Signed   By: Ernie Avena M.D.   On: 10/06/2021 15:06   CT ABDOMEN PELVIS WO CONTRAST  Result Date: 10/05/2021 CLINICAL DATA:  Nausea and vomiting.  Hypotensive. EXAM: CT ABDOMEN AND PELVIS WITHOUT CONTRAST TECHNIQUE: Multidetector CT imaging of the abdomen and pelvis was performed following the standard protocol without IV contrast. RADIATION DOSE REDUCTION: This exam was performed according to the departmental dose-optimization program which includes automated exposure control, adjustment of the mA and/or kV according to patient size and/or use of iterative reconstruction technique. COMPARISON:  01/19/2012 FINDINGS: Lower chest: Atelectasis in the lung bases. Small esophageal hiatal hernia. Hepatobiliary: Hepatic changes suggest cirrhosis with enlarged lateral segment left and caudate lobes. Serpiginous portal and upper abdominal vessel suggesting varices. Gallbladder and bile ducts are unremarkable. Pancreas: Unremarkable. No pancreatic ductal dilatation or surrounding inflammatory changes. Spleen: Surgical absence of the spleen. Adrenals/Urinary Tract: No adrenal gland nodules. Sub capsular and soft  tissue gas collections demonstrated within the right kidney, likely indicating infection with gas-forming organism. The estrogenic or traumatic causes could also have this appearance. Correlate with history of any biopsy or other procedure. No hydronephrosis or hydroureter. Bladder is normal. Stomach/Bowel: Stomach is within normal limits. Appendix appears normal. No evidence of bowel wall thickening, distention, or inflammatory changes. Vascular/Lymphatic: Calcification of the aorta. No aneurysm. Prominent lymph nodes demonstrated in the celiac axis, mesenteric, and retroperitoneum. Largest measure about 1.3 cm short axis dimension. Similar appearance to previous study, likely indicating reactive or cirrhotic related nodes. Reproductive: Status post hysterectomy. No adnexal masses. Other: No free air or free fluid in the abdomen. Inflammatory stranding is demonstrated along the right pericolic gutters. Musculoskeletal: No acute or significant osseous findings. IMPRESSION: 1. Subcapsular and parenchymal gas collections within the right kidney. Unless there is been recent biopsy or other procedure, this is likely to represent infection with gas-forming organism. Inflammatory stranding suggested along the right pericolic gutter. 2. 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 3. Aortic atherosclerosis. Electronically Signed   By: Burman Nieves M.D.   On: 10/05/2021 01:35   DG Chest 2 View  Result Date: 10/04/2021 CLINICAL DATA:  Chest pain, vomiting, weakness EXAM: CHEST - 2 VIEW COMPARISON:  09/19/2014 FINDINGS: Normal heart size and pulmonary vascularity. No focal airspace disease or consolidation in the lungs. No blunting of costophrenic angles. No pneumothorax. Mediastinal contours appear intact. Coronary stents are demonstrated. Calcification of the aorta. IMPRESSION: No active cardiopulmonary disease. Electronically Signed   By: Burman Nieves M.D.   On: 10/04/2021  23:14    Labs:  CBC: Recent Labs    10/09/21 0327 10/10/21 0218 10/11/21 1107 10/12/21 0028  WBC 12.0* 17.1* 19.0* 20.7*  HGB 10.1* 9.6* 8.7* 8.5*  HCT 28.2* 27.5* 24.9* 23.8*  PLT 22* 42* 101* 150    COAGS: Recent Labs    10/04/21 2241 10/06/21 1808 10/07/21 0611  INR 1.2 1.2 1.1  APTT  --  28  --     BMP: Recent Labs    10/08/21 0200 10/08/21 1432 10/09/21 0327 10/11/21 1107  NA 137 136 135 130*  K 3.5 4.0 3.5 3.9  CL 101 100 99 94*  CO2 23 23 26 26   GLUCOSE 234* 301* 140* 258*  BUN 57* 55* 47* 36*  CALCIUM 8.4* 8.4* 8.0* 8.3*  CREATININE 1.15* 1.14* 1.00 1.09*  GFRNONAA 56* 56* >60 60*  LIVER FUNCTION TESTS: Recent Labs    10/05/21 0515 10/05/21 1646 10/06/21 0925 10/09/21 0327  BILITOT 0.7 1.2 1.8* 1.1  AST 18 20 30  14*  ALT 14 17 18 11   ALKPHOS 129* 79 75 89  PROT 5.5* 5.4* 5.8* 5.1*  ALBUMIN 1.7* 2.3* 2.8* 1.9*    TUMOR MARKERS: No results for input(s): "AFPTM", "CEA", "CA199", "CHROMGRNA" in the last 8760 hours.  Assessment and Plan: Emphysematous pyelonephritis Patient with several, small areas of emphysematous pyelonephritis which may be amenable to aspiration, but not likely drain placement per Dr. Fredia Sorrow.  Patient NPO p MN for possible procedure 6/26 in CT.  INR obtained 5 days ago, 1.1.  Not currently on blood thinners.  Platelets improved to 150.   Discussed with patient who is agreeable.  Risks and benefits discussed with the patient including bleeding, infection, damage to adjacent structures.  All of the patient's questions were answered, patient is agreeable to proceed. Consent signed and in chart.  Thank you for this interesting consult.  I greatly enjoyed meeting Carlon S Masella and look forward to participating in their care.  A copy of this report was sent to the requesting provider on this date.  Electronically Signed: Hoyt Koch, PA 10/12/2021, 12:56 PM   I spent a total of 40 Minutes    in  face to face in clinical consultation, greater than 50% of which was counseling/coordinating care for emphysematous pyelonephritis.

## 2021-10-13 ENCOUNTER — Inpatient Hospital Stay (HOSPITAL_COMMUNITY): Payer: Commercial Managed Care - HMO

## 2021-10-13 DIAGNOSIS — B9620 Unspecified Escherichia coli [E. coli] as the cause of diseases classified elsewhere: Secondary | ICD-10-CM | POA: Diagnosis not present

## 2021-10-13 DIAGNOSIS — N179 Acute kidney failure, unspecified: Secondary | ICD-10-CM | POA: Diagnosis not present

## 2021-10-13 DIAGNOSIS — J9601 Acute respiratory failure with hypoxia: Secondary | ICD-10-CM | POA: Diagnosis not present

## 2021-10-13 DIAGNOSIS — K7469 Other cirrhosis of liver: Secondary | ICD-10-CM | POA: Diagnosis not present

## 2021-10-13 DIAGNOSIS — N12 Tubulo-interstitial nephritis, not specified as acute or chronic: Secondary | ICD-10-CM | POA: Diagnosis not present

## 2021-10-13 LAB — GLUCOSE, CAPILLARY
Glucose-Capillary: 217 mg/dL — ABNORMAL HIGH (ref 70–99)
Glucose-Capillary: 183 mg/dL — ABNORMAL HIGH (ref 70–99)
Glucose-Capillary: 257 mg/dL — ABNORMAL HIGH (ref 70–99)
Glucose-Capillary: 323 mg/dL — ABNORMAL HIGH (ref 70–99)

## 2021-10-13 LAB — BASIC METABOLIC PANEL
Anion gap: 6 (ref 5–15)
BUN: 26 mg/dL — ABNORMAL HIGH (ref 6–20)
CO2: 27 mmol/L (ref 22–32)
Calcium: 8.2 mg/dL — ABNORMAL LOW (ref 8.9–10.3)
Chloride: 97 mmol/L — ABNORMAL LOW (ref 98–111)
Creatinine, Ser: 1.17 mg/dL — ABNORMAL HIGH (ref 0.44–1.00)
GFR, Estimated: 55 mL/min — ABNORMAL LOW (ref >60)
Glucose, Bld: 287 mg/dL — ABNORMAL HIGH (ref 70–99)
Potassium: 5 mmol/L (ref 3.5–5.1)
Sodium: 130 mmol/L — ABNORMAL LOW (ref 135–145)

## 2021-10-13 LAB — CBC
HCT: 23.7 % — ABNORMAL LOW (ref 36.0–46.0)
Hemoglobin: 8.1 g/dL — ABNORMAL LOW (ref 12.0–15.0)
MCH: 32.4 pg (ref 26.0–34.0)
MCHC: 34.2 g/dL (ref 30.0–36.0)
MCV: 94.8 fL (ref 80.0–100.0)
Platelets: 276 10*3/uL (ref 150–400)
RBC: 2.5 MIL/uL — ABNORMAL LOW (ref 3.87–5.11)
RDW: 14.7 % (ref 11.5–15.5)
WBC: 18.2 10*3/uL — ABNORMAL HIGH (ref 4.0–10.5)
nRBC: 0.1 % (ref 0.0–0.2)

## 2021-10-13 MED ORDER — MIDAZOLAM HCL 2 MG/2ML IJ SOLN
INTRAMUSCULAR | Status: AC
  Filled 2021-10-13: qty 4

## 2021-10-13 MED ORDER — BISACODYL 10 MG RE SUPP
10.0000 mg | Freq: Once | RECTAL | Status: AC
  Filled 2021-10-13: qty 1

## 2021-10-13 MED ORDER — SORBITOL 70 % SOLN
960.0000 mL | TOPICAL_OIL | Freq: Once | ORAL | Status: DC
  Filled 2021-10-13: qty 473

## 2021-10-13 MED ORDER — FENTANYL CITRATE (PF) 100 MCG/2ML IJ SOLN
INTRAMUSCULAR | Status: AC
  Filled 2021-10-13: qty 4

## 2021-10-13 MED ORDER — MIDAZOLAM HCL 2 MG/2ML IJ SOLN
INTRAMUSCULAR | Status: AC | PRN
  Administered 2021-10-13: .5 mg via INTRAVENOUS
  Administered 2021-10-13: 1 mg via INTRAVENOUS

## 2021-10-13 MED ORDER — FENTANYL CITRATE (PF) 100 MCG/2ML IJ SOLN
INTRAMUSCULAR | Status: AC | PRN
  Administered 2021-10-13: 25 ug via INTRAVENOUS
  Administered 2021-10-13: 50 ug via INTRAVENOUS

## 2021-10-13 MED ORDER — BISACODYL 10 MG RE SUPP
10.0000 mg | Freq: Once | RECTAL | Status: AC
  Administered 2021-10-13: 10 mg via RECTAL
  Filled 2021-10-13: qty 1

## 2021-10-13 MED ORDER — LIDOCAINE HCL 1 % IJ SOLN
INTRAMUSCULAR | Status: AC
  Filled 2021-10-13: qty 10

## 2021-10-14 DIAGNOSIS — J9601 Acute respiratory failure with hypoxia: Secondary | ICD-10-CM | POA: Diagnosis not present

## 2021-10-14 DIAGNOSIS — K7469 Other cirrhosis of liver: Secondary | ICD-10-CM | POA: Diagnosis not present

## 2021-10-14 DIAGNOSIS — N12 Tubulo-interstitial nephritis, not specified as acute or chronic: Secondary | ICD-10-CM | POA: Diagnosis not present

## 2021-10-14 DIAGNOSIS — N179 Acute kidney failure, unspecified: Secondary | ICD-10-CM | POA: Diagnosis not present

## 2021-10-14 LAB — GLUCOSE, CAPILLARY
Glucose-Capillary: 236 mg/dL — ABNORMAL HIGH (ref 70–99)
Glucose-Capillary: 221 mg/dL — ABNORMAL HIGH (ref 70–99)
Glucose-Capillary: 224 mg/dL — ABNORMAL HIGH (ref 70–99)
Glucose-Capillary: 310 mg/dL — ABNORMAL HIGH (ref 70–99)

## 2021-10-14 LAB — CBC
HCT: 24.1 % — ABNORMAL LOW (ref 36.0–46.0)
Hemoglobin: 8.3 g/dL — ABNORMAL LOW (ref 12.0–15.0)
MCH: 32.4 pg (ref 26.0–34.0)
MCHC: 34.4 g/dL (ref 30.0–36.0)
MCV: 94.1 fL (ref 80.0–100.0)
Platelets: 393 10*3/uL (ref 150–400)
RBC: 2.56 MIL/uL — ABNORMAL LOW (ref 3.87–5.11)
RDW: 14.5 % (ref 11.5–15.5)
WBC: 15.4 10*3/uL — ABNORMAL HIGH (ref 4.0–10.5)
nRBC: 0.1 % (ref 0.0–0.2)

## 2021-10-14 MED ORDER — INSULIN ASPART 100 UNIT/ML IJ SOLN
10.0000 [IU] | Freq: Three times a day (TID) | INTRAMUSCULAR | Status: DC
  Administered 2021-10-14 – 2021-10-28 (×33): 10 [IU] via SUBCUTANEOUS

## 2021-10-14 NOTE — Progress Notes (Signed)
Patient transferred from 4E in w/c accompanied by husband.  Skin warm and dry.  She came with foley for strict Is&Os.  Will continue to monitor.

## 2021-10-14 NOTE — Progress Notes (Signed)
Inpatient Diabetes Program Recommendations  AACE/ADA: New Consensus Statement on Inpatient Glycemic Control (2015)  Target Ranges:  Prepandial:   less than 140 mg/dL      Peak postprandial:   less than 180 mg/dL (1-2 hours)      Critically ill patients:  140 - 180 mg/dL   Lab Results  Component Value Date   GLUCAP 236 (H) 10/14/2021   HGBA1C 12.5 (H) 10/05/2021    Review of Glycemic Control  Latest Reference Range & Units 10/13/21 06:15 10/13/21 11:41 10/13/21 16:46 10/13/21 21:56 10/14/21 06:30  Glucose-Capillary 70 - 99 mg/dL 347 (H) 425 (H) 956 (H) 323 (H) 236 (H)   Diabetes history: DM 2 Outpatient Diabetes medications:  Amaryl 4 mg daily Basaglar 30 units q HS Metformin 1000 mg bid  Current orders for Inpatient glycemic control:  Novolog moderate tid with meals  Semglee 15 units bid  Inpatient Diabetes Program Recommendations:    Please increase Semglee to 20 units bid and add Novolog meal coverage 4 units tid with meals (hold if patient eats less than 50% or NPO).    Thanks,  Beryl Meager, RN, BC-ADM Inpatient Diabetes Coordinator Pager [phone]  (8a-5p)

## 2021-10-14 NOTE — Progress Notes (Signed)
Mobility Specialist: Progress Note   10/14/21 1746  Mobility  Activity Ambulated with assistance in hallway  Level of Assistance Modified independent, requires aide device or extra time  Assistive Device Front wheel walker  Distance Ambulated (ft) 470 ft  Activity Response Tolerated well  $Mobility charge 1 Mobility   Pt received in the bed and agreeable to mobility. C/o mild dizziness when standing, resolved with distance, otherwise no c/o. Pt sitting EOB after session with RN present in the room.   [facility] [place] Mobility Specialist Mobility Specialist [place]: [phone]

## 2021-10-15 ENCOUNTER — Inpatient Hospital Stay (HOSPITAL_COMMUNITY): Payer: Commercial Managed Care - HMO

## 2021-10-15 ENCOUNTER — Other Ambulatory Visit (HOSPITAL_COMMUNITY): Payer: Self-pay

## 2021-10-15 DIAGNOSIS — N12 Tubulo-interstitial nephritis, not specified as acute or chronic: Secondary | ICD-10-CM | POA: Diagnosis not present

## 2021-10-15 DIAGNOSIS — R7881 Bacteremia: Secondary | ICD-10-CM | POA: Diagnosis not present

## 2021-10-15 DIAGNOSIS — J9601 Acute respiratory failure with hypoxia: Secondary | ICD-10-CM | POA: Diagnosis not present

## 2021-10-15 DIAGNOSIS — B9620 Unspecified Escherichia coli [E. coli] as the cause of diseases classified elsewhere: Secondary | ICD-10-CM | POA: Diagnosis not present

## 2021-10-15 DIAGNOSIS — A419 Sepsis, unspecified organism: Secondary | ICD-10-CM | POA: Diagnosis not present

## 2021-10-15 LAB — BASIC METABOLIC PANEL
Anion gap: 10 (ref 5–15)
BUN: 16 mg/dL (ref 6–20)
CO2: 29 mmol/L (ref 22–32)
Calcium: 8.5 mg/dL — ABNORMAL LOW (ref 8.9–10.3)
Chloride: 95 mmol/L — ABNORMAL LOW (ref 98–111)
Creatinine, Ser: 1.19 mg/dL — ABNORMAL HIGH (ref 0.44–1.00)
GFR, Estimated: 54 mL/min — ABNORMAL LOW (ref >60)
Glucose, Bld: 229 mg/dL — ABNORMAL HIGH (ref 70–99)
Potassium: 5.1 mmol/L (ref 3.5–5.1)
Sodium: 134 mmol/L — ABNORMAL LOW (ref 135–145)

## 2021-10-15 LAB — CBC
HCT: 24.4 % — ABNORMAL LOW (ref 36.0–46.0)
Hemoglobin: 8.5 g/dL — ABNORMAL LOW (ref 12.0–15.0)
MCH: 32.9 pg (ref 26.0–34.0)
MCHC: 34.8 g/dL (ref 30.0–36.0)
MCV: 94.6 fL (ref 80.0–100.0)
Platelets: 608 10*3/uL — ABNORMAL HIGH (ref 150–400)
RBC: 2.58 MIL/uL — ABNORMAL LOW (ref 3.87–5.11)
RDW: 15 % (ref 11.5–15.5)
WBC: 18 10*3/uL — ABNORMAL HIGH (ref 4.0–10.5)
nRBC: 0.1 % (ref 0.0–0.2)

## 2021-10-15 LAB — GLUCOSE, CAPILLARY
Glucose-Capillary: 196 mg/dL — ABNORMAL HIGH (ref 70–99)
Glucose-Capillary: 132 mg/dL — ABNORMAL HIGH (ref 70–99)
Glucose-Capillary: 184 mg/dL — ABNORMAL HIGH (ref 70–99)
Glucose-Capillary: 314 mg/dL — ABNORMAL HIGH (ref 70–99)

## 2021-10-15 MED ORDER — LIVING WELL WITH DIABETES BOOK
Freq: Once | Status: AC
  Filled 2021-10-15: qty 1

## 2021-10-15 MED ORDER — INSULIN GLARGINE-YFGN 100 UNIT/ML ~~LOC~~ SOLN
20.0000 [IU] | Freq: Two times a day (BID) | SUBCUTANEOUS | Status: DC
  Administered 2021-10-15 – 2021-10-26 (×22): 20 [IU] via SUBCUTANEOUS
  Filled 2021-10-15 (×24): qty 0.2

## 2021-10-15 MED ORDER — INSULIN ASPART 100 UNIT/ML IJ SOLN
0.0000 [IU] | Freq: Every day | INTRAMUSCULAR | Status: DC
  Administered 2021-10-15: 4 [IU] via SUBCUTANEOUS
  Administered 2021-10-17: 3 [IU] via SUBCUTANEOUS
  Administered 2021-10-26: 2 [IU] via SUBCUTANEOUS

## 2021-10-15 MED ORDER — INSULIN ASPART 100 UNIT/ML IJ SOLN
0.0000 [IU] | Freq: Three times a day (TID) | INTRAMUSCULAR | Status: DC
  Administered 2021-10-15: 3 [IU] via SUBCUTANEOUS
  Administered 2021-10-16: 5 [IU] via SUBCUTANEOUS
  Administered 2021-10-16 (×2): 3 [IU] via SUBCUTANEOUS
  Administered 2021-10-17: 2 [IU] via SUBCUTANEOUS
  Administered 2021-10-18: 3 [IU] via SUBCUTANEOUS
  Administered 2021-10-18 – 2021-10-19 (×2): 2 [IU] via SUBCUTANEOUS
  Administered 2021-10-19: 3 [IU] via SUBCUTANEOUS
  Administered 2021-10-20: 2 [IU] via SUBCUTANEOUS
  Administered 2021-10-20: 5 [IU] via SUBCUTANEOUS
  Administered 2021-10-21: 2 [IU] via SUBCUTANEOUS
  Administered 2021-10-21 (×2): 3 [IU] via SUBCUTANEOUS
  Administered 2021-10-22: 5 [IU] via SUBCUTANEOUS
  Administered 2021-10-22: 3 [IU] via SUBCUTANEOUS
  Administered 2021-10-22 – 2021-10-23 (×2): 2 [IU] via SUBCUTANEOUS
  Administered 2021-10-24: 11 [IU] via SUBCUTANEOUS
  Administered 2021-10-24 – 2021-10-26 (×5): 3 [IU] via SUBCUTANEOUS
  Administered 2021-10-26 (×2): 5 [IU] via SUBCUTANEOUS
  Administered 2021-10-27 (×2): 3 [IU] via SUBCUTANEOUS
  Administered 2021-10-28: 2 [IU] via SUBCUTANEOUS

## 2021-10-15 NOTE — Progress Notes (Signed)
Occupational Therapy Treatment Patient Details Name: [name] MRN: [id number] DOB: 1965/07/25 Today's Date: 10/15/2021   History of present illness Pt is a 56 y.o. female admitted 10/04/21 with c/o SOB, chest pain, N/V/D, loss of appetite. 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 S/p R perinephric aspiration 6/26. PMH includes CAD, DM, fibromyalgia, neuropathy, ischemic cardiomyopathy, sinus bradycardia, NASH.   OT comments  Pt making incremental progress with OT goals this session. She was able to ambulate to the sink with no assist. Bed mobility was limited by pain. She required increased time for all tasks. Continuing to recommend [place] and OT will follow acutely.    Recommendations for follow up therapy are one component of a multi-disciplinary discharge planning process, led by the attending physician.  Recommendations may be updated based on patient status, additional functional criteria and insurance authorization.    Follow Up Recommendations  Home health OT    Assistance Recommended at Discharge Intermittent Supervision/Assistance  Patient can return home with the following  A little help with bathing/dressing/bathroom;Assist for transportation;Assistance with cooking/housework   Equipment Recommendations  BSC/3in1    Recommendations for Other Services      Precautions / Restrictions Precautions Precautions: Fall;Other (comment) Precaution Comments: hypotension Restrictions Weight Bearing Restrictions: No       Mobility Bed Mobility Overal bed mobility: Needs Assistance Bed Mobility: Supine to Sit     Supine to sit: HOB elevated, Min assist     General bed mobility comments: Min A and increased time due to pain    Transfers Overall transfer level: Needs assistance Equipment used: Rolling walker (2 wheels) Transfers: Sit to/from Stand Sit to Stand:  Supervision           General transfer comment: No assist needed, increased time due to pain in abdomen     Balance Overall balance assessment: Needs assistance Sitting-balance support: No upper extremity supported, Feet unsupported Sitting balance-Leahy Scale: Good     Standing balance support: During functional activity, Reliant on assistive device for balance Standing balance-Leahy Scale: Fair Standing balance comment: able to static stand without UE support                           ADL either performed or assessed with clinical judgement   ADL Overall ADL's : Needs assistance/impaired     Grooming: Wash/dry hands;Wash/dry face;Oral care;Min guard;Standing Grooming Details (indicate cue type and reason): at sink                               General ADL Comments: Limited due to pain    Extremity/Trunk Assessment              Vision       Perception     Praxis      Cognition Arousal/Alertness: Awake/alert Behavior During Therapy: Flat affect Overall Cognitive Status: Within Functional Limits for tasks assessed                                          Exercises      Shoulder Instructions       General Comments VSS on RA    Pertinent Vitals/ Pain       Pain Assessment Pain Assessment: 0-10 Pain Score:  8  Pain Location: back, BLEs Pain Descriptors / Indicators: Sore, Tiring Pain Intervention(s): Limited activity within patient's tolerance, Monitored during session, Repositioned  Home Living                                          Prior Functioning/Environment              Frequency  Min 2X/week        Progress Toward Goals  OT Goals(current goals can now be found in the care plan section)  Progress towards OT goals: Progressing toward goals  Acute Rehab OT Goals Patient Stated Goal: To go home OT Goal Formulation: With patient Time For Goal Achievement:  10/17/21 Potential to Achieve Goals: Good ADL Goals Pt Will Perform Grooming: with supervision Pt Will Perform Lower Body Bathing: with set-up Pt Will Perform Upper Body Dressing: with modified independence Pt Will Perform Lower Body Dressing: with modified independence Pt Will Transfer to Toilet: with modified independence  Plan Discharge plan remains appropriate    Co-evaluation                 AM-PAC OT "6 Clicks" Daily Activity     Outcome Measure   Help from another person eating meals?: A Little Help from another person taking care of personal grooming?: A Little Help from another person toileting, which includes using toliet, bedpan, or urinal?: A Little Help from another person bathing (including washing, rinsing, drying)?: A Little Help from another person to put on and taking off regular upper body clothing?: A Little Help from another person to put on and taking off regular lower body clothing?: A Lot 6 Click Score: 17    End of Session Equipment Utilized During Treatment: Rolling walker (2 wheels)  OT Visit Diagnosis: Muscle weakness (generalized) (M62.81)   Activity Tolerance Patient tolerated treatment well   Patient Left in bed;with call bell/phone within reach   Nurse Communication Mobility status        Time: 1937-9024 OT Time Calculation (min): 12 min  Charges: OT General Charges $OT Visit: 1 Visit OT Treatments $Self Care/Home Management : 8-22 mins  Devanta Daniel H., OTR/L Acute Rehabilitation  [name] 10/15/2021, 7:11 PM

## 2021-10-15 NOTE — Inpatient Diabetes Management (Addendum)
Inpatient Diabetes Program Recommendations  AACE/ADA: New Consensus Statement on Inpatient Glycemic Control (2015)  Target Ranges:  Prepandial:   less than 140 mg/dL      Peak postprandial:   less than 180 mg/dL (1-2 hours)      Critically ill patients:  140 - 180 mg/dL   Lab Results  Component Value Date   GLUCAP 196 (H) 10/15/2021   HGBA1C 12.5 (H) 10/05/2021    Review of Glycemic Control  Latest Reference Range & Units 10/14/21 06:30 10/14/21 11:33 10/14/21 16:16 10/14/21 21:12 10/15/21 07:17  Glucose-Capillary 70 - 99 mg/dL 236 (H) 221 (H) 224 (H) 310 (H) 196 (H)  (H): Data is abnormally high  Diabetes history: DM 2 Outpatient Diabetes medications:  Amaryl 4 mg daily Basaglar 30 units q HS Metformin 1000 mg bid   Current orders for Inpatient glycemic control:  Novolog 12 units TID with meals Semglee 15 units bid   Inpatient Diabetes Program Recommendations:      1-Semglee to 20 units bid 2-Novolog 0-15 units TID and 0-5 QHS  Spoke with patient at bedside.  Reviewed patient's current A1c of 12.5% (average BG of 312 mg/dL). Explained what a A1c is and what it measures. Also reviewed goal A1c with patient, importance of good glucose control @ home, and blood sugar goals.  She states her PCP started her on basaglar 30 units QD 2 weeks ago.  She states she has only administered a couple of times before she got sick and too weak to administer.  She states she does not work, is disabled from an injury at work (cannot lift her right arm > 90 degrees).  She received a settlement but is almost out of that money.  She states she has an $8000 deductible.  Asked our pharmacy to run a benefit check.  Basaglar is covered at $75 (must do 90 day supply) and Novolog needs a pre authorization, Humalog is not covered.  Humulin 70/30 is covered at $25 for a 30 day supply which seems to be the most reasonable.  She states she does not have any income.  Will reach out to [facility] for guidance on applying  for disability and or Medicaid.    She states she went to [facility] and was able to get 1 basaglar pen for $25.    May be that ReliOn 70/30 may be more affordable.     Ordered LWWD.  Educated on The Plate Method, CHO's, portion control, CBGs at home fasting and mid afternoon, F/U with PCP every 3 months, bring meter to PCP office, long and short term complications of uncontrolled BG, and importance of exercise.  She states she and her boyfriend grill out often, they don't eat sweets.  She does watch her CHO intake.  She drinks 1 regular soda which lasts her 2 days.  She becomes tearful talking about her fathers complications from diabetes and how he needed several amputations.  Also tearful about losing her teeth in March.  Her dentures do not fit her correctly and she can't wear them.    Will continue to follow while inpatient.  Thank you, Reche Dixon, MSN, [place] Diabetes Coordinator Inpatient Diabetes Program [phone] (team pager from 8a-5p)

## 2021-10-15 NOTE — Progress Notes (Signed)
[facility] for Infectious Disease  Date of Admission:  10/04/2021     Lines:  Peripheral iv 6/18-c foley catheter   Abx: 6/8-20; 6/21-c ceftriaxone   6/20-21 cefazolin                                                             Assessment: 56 yo female with cad, dm2, nash cirrhosis, hx cdiff 10 years prior to this admission, admitted 6/17 with n/v/diarrhea, myalgia, s/p splenectomy for ?lipemic spleenfound to meet severe sepsis/septic shock criteria in setting emphysematous right sided pyelonephritis and ecoli bacteremia, complicated by chf with associated respiratory failure, thrombocytopenia   6/18 admission bcx ecoli (R cefazolin MIC </=4 for blood); of note, our labs does not have capability to test for mic less than 4 6/18 ucx ecoli (S cefazolin)   Urology following.   6/20 ct abd/pelv repeat mixed changes with some extension and improvement  S/p IR perc drain 6/26 unable to leave drain; only 7 cc pus removed. Cx ngtd in setting already on abx     Septic shock related complications: Aki resolved Thrombocytopenia resolved Chf/hypoxemic respiratory failure resolved Elevated troponin in setting sepsis/aki/chf. Demand ischemia, improved/resolved     ---------- 6/28 assessment Today continues to have severe abd pain may be worse "said it's like labor pain." Worsening leukocytosis/thrombocytosis Afebrile and no sign of sepsis however and clinically eating and working with po/ot  Unclear prognosis at this time in terms of whether or not she'll need surgery  Discussed with dr Gloriann Loan of urology; will defer to him on if he wants to repeat abd ct in a few days     Plan: Continue ceftriaxone Likely will need ongoing monitoring in the hospital Discussed with urology/primary team  I spent more than 35 minute reviewing data/chart, and coordinating care and >50% direct face to face time providing counseling/discussing diagnostics/treatment plan with  patient     Principal Problem:   Emphysematous pyelitis Active Problems:   Insulin-requiring or dependent type II diabetes mellitus (HCC)   Leukocytosis   CAD (coronary artery disease)   Hypotension   Thrombocytopenia (HCC)   AKI (acute kidney injury) ([place])   Elevated troponin   Acidosis   Cirrhosis (HCC)   Septic shock ([place])   Acute respiratory failure with hypoxia (HCC)   E coli bacteremia   Allergies  Allergen Reactions   Aspartame And Phenylalanine Anaphylaxis, Shortness Of Breath and Swelling    Throat swells  ANY ARTIFICAL SWEETNERS   Toradol [Ketorolac Tromethamine] Other (See Comments)    Face & neck flushed red & pt felt very hot after IV adm.   Sucralose Rash    Pt.s face broke out in rash after drinking Breeza.  Pt. Claims it is from the artificial sweetener.      Codeine Swelling   Dilaudid [Hydromorphone Hcl] Itching   Hydrocodone Itching   Oxycodone Itching    Scheduled Meds:  sodium chloride   Intravenous Once   Chlorhexidine Gluconate Cloth  6 each Topical Daily   DULoxetine  60 mg Oral QHS   feeding supplement  237 mL Oral TID BM   gabapentin  300 mg Oral TID   insulin aspart  10 Units Subcutaneous TID WC   insulin glargine-yfgn  20 Units  Subcutaneous BID   multivitamin with minerals  1 tablet Oral Daily   pantoprazole  40 mg Oral Daily   polyethylene glycol  17 g Oral BID   senna-docusate  1 tablet Oral BID   sodium chloride flush  3 mL Intravenous Q12H   sorbitol, milk of mag, mineral oil, glycerin (SMOG) enema  960 mL Rectal Once   Continuous Infusions:  sodium chloride Stopped (10/05/21 0517)   cefTRIAXone (ROCEPHIN)  IV 2 g (10/14/21 1747)   PRN Meds:.acetaminophen **OR** acetaminophen, lip balm, ondansetron **OR** ondansetron (ZOFRAN) IV, mouth rinse, traMADol   SUBJECTIVE: Afebrile Severe abd pain Eating decently Working with physical therapy Hemodynamics stable Rising wbc/platelet  No n/v/diarrhea    Review of  Systems: ROS All other ROS was negative, except mentioned above     OBJECTIVE: Vitals:   10/14/21 2111 10/15/21 0432 10/15/21 0432 10/15/21 0902  BP: (!) 134/58  94/62 (!) 103/49  Pulse: 78  74 80  Resp: '18  18 18  '$ Temp: 98.4 F (36.9 C)  98.4 F (36.9 C) 98.5 F (36.9 C)  TempSrc: Oral  Oral Oral  SpO2: 96%  93% 90%  Weight:  72.2 kg    Height:       Body mass index is 28.65 kg/m.  Physical Exam General/constitutional: no distress, pleasant HEENT: Normocephalic, PER, Conj Clear, EOMI, Oropharynx clear Neck supple CV: rrr no mrg Lungs: clear to auscultation, normal respiratory effort Abd: Soft, severely tender; no rebound Ext: no edema Skin: No Rash Neuro: nonfocal MSK: no peripheral joint swelling/tenderness/warmth; back spines nontender    Lab Results Lab Results  Component Value Date   WBC 18.0 (H) 10/15/2021   HGB 8.5 (L) 10/15/2021   HCT 24.4 (L) 10/15/2021   MCV 94.6 10/15/2021   PLT 608 (H) 10/15/2021    Lab Results  Component Value Date   CREATININE 1.19 (H) 10/15/2021   BUN 16 10/15/2021   NA 134 (L) 10/15/2021   K 5.1 10/15/2021   CL 95 (L) 10/15/2021   CO2 29 10/15/2021    Lab Results  Component Value Date   ALT 11 10/09/2021   AST 14 (L) 10/09/2021   ALKPHOS 89 10/09/2021   BILITOT 1.1 10/09/2021      Microbiology: Recent Results (from the past 240 hour(s))  Aerobic/Anaerobic Culture w Gram Stain (surgical/deep wound)     Status: None (Preliminary result)   Collection Time: 10/13/21 10:52 AM   Specimen: Abscess  Result Value Ref Range Status   Specimen Description ABSCESS KIDNEY  Final   Special Requests NONE  Final   Gram Stain   Final    ABUNDANT WBC PRESENT, PREDOMINANTLY PMN RARE GRAM NEGATIVE RODS Performed at [facility], [address], [place], [place] [address]    Culture   Final    RARE ESCHERICHIA COLI NO ANAEROBES ISOLATED; CULTURE IN PROGRESS FOR 5 DAYS    Report Status PENDING  Incomplete   Organism  ID, Bacteria ESCHERICHIA COLI  Final      Susceptibility   Escherichia coli - MIC*    AMPICILLIN >=32 RESISTANT Resistant     CEFAZOLIN <=4 SENSITIVE Sensitive     CEFEPIME <=0.12 SENSITIVE Sensitive     CEFTAZIDIME <=1 SENSITIVE Sensitive     CEFTRIAXONE <=0.25 SENSITIVE Sensitive     CIPROFLOXACIN <=0.25 SENSITIVE Sensitive     GENTAMICIN <=1 SENSITIVE Sensitive     IMIPENEM <=0.25 SENSITIVE Sensitive     TRIMETH/SULFA <=20 SENSITIVE Sensitive  AMPICILLIN/SULBACTAM 16 INTERMEDIATE Intermediate     PIP/TAZO <=4 SENSITIVE Sensitive     * RARE ESCHERICHIA COLI     Serology:   Imaging: If present, new imagings (plain films, ct scans, and mri) have been personally visualized and interpreted; radiology reports have been reviewed. Decision making incorporated into the Impression / Recommendations.   6/20 ct abd pelv without contrast Persistent findings of subcapsular gas in the right kidney although the collections appear smaller there has been some extension into the Perirenal fatty tissues which is new from the prior exam.   Scattered lymph nodes as described similar to that seen on the prior exam.   Significant increase in the degree of infiltrate with associated effusions in the bases bilaterally.         6/18 ct abd pelv without contrast 1. Subcapsular and parenchymal gas collections within the right kidney. Unless there is been recent biopsy or other procedure, this is likely to represent infection with gas-forming organism. Inflammatory stranding suggested along the right pericolic gutter. 2. 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 3. Aortic atherosclerosis.   6/17 cxr No active cardiopulmonary disease.  Jabier Mutton, [place] [facility] [phone] pager    10/15/2021, 1:09 PM

## 2021-10-15 NOTE — TOC Progression Note (Signed)
Transition of Care [facility]) - Progression Note    Patient Details  Name: ULYANA PITONES MRN: [id number] Date of Birth: [birth year]  [facility] CM/SW Contact  Tom-Johnson, Renea Ee, RN Phone Number: 10/15/2021, 1:40 PM  Clinical Narrative:     Patient had Perinephric aspiration done on 10/13/21 by IR. Continues on IV abx. Home health PT/OT referral with [facility], info on AVS. [facility] on board for home IV abx if patient is discharged home on IV abx.  CM continues to follow with needs.  Expected Discharge Plan: [place] Barriers to Discharge: Continued Medical Work up  Expected Discharge Plan and Services Expected Discharge Plan: [place]   Discharge Planning Services: CM Consult Post Acute Care Choice: [place] arrangements for the past 2 months: Single Family Home                   DME Agency: NA       HH Arranged: PT, RN [place] Agency: [place] ([facility]) Date HH Agency Contacted: 10/10/21 Time [place]: 1034 Representative spoke with at [place]: Caryl Pina , will need orders   Social Determinants of Health (SDOH) Interventions    Readmission Risk Interventions     No data to display

## 2021-10-15 NOTE — Progress Notes (Signed)
Progress Note   Patient: [name] OFB:[id number] DOB: 09/02/1965 DOA: 10/04/2021     10 DOS: the patient was seen and examined on 10/15/2021   Brief hospital course: 56 y.o. female with a history of with inferior STEMI in 2015, insulin-dependent diabetes mellitus, hypertension, and NASH. Patient presented secondary to nausea, vomiting, generalized aches and was found to be in septic shock from right emphysematous pyelonephritis. Patient empirically treated with Ceftriaxone and admitted to the ICU for vasopressor support after initial resuscitation with IV fluids failed. Urine and blood cultures were positive for E. Coli. Antibiotics transitioned to Cefazolin prior to transitioning back to Ceftriaxone. Perinephric aspiration performed on 6/26 by IR.  Assessment and Plan: Septic shock Present on admission. Secondary to pyelonephritis and E. Coli bacteremia. Patient required ICU on admission and norepinephrine for vasopressor support in addition to midodrine. Continued on abx per below.   Right emphysematous pyelonephritis E. Coli bacteremia CT imaging on admission (6/18) was significant for gas collections within the right kidney concerning for emphysematous pyelonephritis. Blood and urine cultures positive for E. Coli bacteria. ID consulted. Patient transitioned to Ancef and is back on Ceftriaxone. WBC trended up but seems to have possibly reached a plateau. Repeat CT abdomen/pelvis with some evidence of disease improvement. IR consulted and performed CT guided right perinephric aspiration on 6/26 yielding 7 mL of purulent fluid; gram stain with GNRs. -ID recommendations: Ceftriaxone IV -Still complaining of flank pain with persistently elevated WBC -Appreciate ID and Urology input. Plan to continue rocephin for now, defer to Urology regarding repeat imaging in next few days   Acute respiratory failure with hypoxia Secondary to pulmonary edema. Patient initially requiring BiPAP for  management. Weaned to room air. -Remains on minimal O2 support   Acute pulmonary edema Secondary to acute heart failure.   Acute on chronic systolic heart failure LVEF 40-45% on 6/19 which is slightly worse from previous baseline. Patient is on lisinopril as an outpatient. Acute failure likely secondary fluid resuscitation.  -Improved with doses of lasix -Cont to follow daily weights and strict in/out   AKI Secondary to ATN from septic shock and resultant hypotension. Peak creatinine of 3.07 which has now trended back down to baseline. -Cont to follow BMET trends   High anion gap metabolic acidosis Likely secondary to AKI. Resolved with resolution of AKI.   Hypokalemia Treated with potassium supplementation. Resolved.   Thrombocytopenia Unknown baseline. Possibly secondary to acute illness if acute vs NASH cirrhosis if chronic. Patient has received 3 units of platelets to date.  -Resolved   Hypoglycemia In setting of sepsis. Resolved.   Leukocytosis Peak of 20,700. Trending down. In setting of sepsis/pyelonephritis/abscesses   NASH Cirrhosis Upper abdominal varices Patient with along history of NASH, now with evidence of cirrhosis on CT imaging. Patient with a remote drinking history but not significant per report. Recent weight loss. Patient will need GI follow-up.   Diabetes mellitus, type 2 Diabetic neuropathy Patient is on insulin glargine 30 units qHS and metformin as an outpatient. She is on gabapentin 1,200 mg qHS for neuropathy. Hemoglobin A1C of 12.5%.  -On Novolog 10 units TID  -Increase semglee to 20 units BID per Diabetic Coordinator recs -Gabapentin 300 mg TID -Will add SSI if needed   Hypocalcemia Secondary to hypoalbuminemia. Corrected calcium is wnl.   Constipation Resolved with dulcolax suppository  L shoulder pain -Reports L shoulder pain after bumping into it during transfer to bed while getting CT earlier -Ordered and reviewed L shoulder xray,  negative       Subjective: Complaining of continued R flank pani and also L shoulder pains  Physical Exam: Vitals:   10/15/21 0432 10/15/21 0432 10/15/21 0902 10/15/21 1600  BP:  94/62 (!) 103/49 (!) 89/39  Pulse:  74 80 83  Resp:  '18 18 18  '$ Temp:  98.4 F (36.9 C) 98.5 F (36.9 C) 98 F (36.7 C)  TempSrc:  Oral Oral Oral  SpO2:  93% 90% 91%  Weight: 72.2 kg     Height:       General exam: Awake, laying in bed, in nad Respiratory system: Normal respiratory effort, no wheezing Cardiovascular system: regular rate, s1, s2 Gastrointestinal system: Soft, nondistended, positive BS Central nervous system: CN2-12 grossly intact, strength intact Extremities: Perfused, no clubbing Skin: Normal skin turgor, no notable skin lesions seen Psychiatry: Mood normal // no visual hallucinations   Data Reviewed:  Labs reviewed: Na 134, K 5.1, Cr 1.19, WBC 18, hgb 8.5  Family Communication: Pt in room, family not at bedside  Disposition: Status is: Inpatient Remains inpatient appropriate because: Severity of illness  Planned Discharge Destination: Home     Author: Marylu Lund, MD 10/15/2021 5:40 PM  For on call review [email or web address].

## 2021-10-15 NOTE — TOC Benefit Eligibility Note (Addendum)
Patient Teacher, English as a foreign language completed.    The patient is currently admitted and upon discharge could be taking Copy.  The current 90 day co-pay is, $75.00.  (Insurance requires 90 day supply)  The patient is currently admitted and upon discharge could be taking Financial planner.  Requires Prior Authorization  The patient is currently admitted and upon discharge could be taking Humalog Pens.  Non Formulary  The patient is currently admitted and upon discharge could be taking Humulin 70/30 KwikPens.  The current 30 day co-pay is, $25.00.   The patient is currently admitted and upon discharge could be taking Novolog 70/30 Pens.  Requires Prior Authorization    The patient is insured through [place], [place] Patient Advocate Specialist [place] Patient Advocate Team Direct Number: [phone]  Fax: [phone]

## 2021-10-15 NOTE — Progress Notes (Signed)
Mobility Specialist Criteria Algorithm Info.   10/15/21 1015  Mobility  Activity Ambulated with assistance in hallway;Dangled on edge of bed  Range of Motion/Exercises Active;All extremities  Level of Assistance Modified independent, requires aide device or extra time  Assistive Device Front wheel walker  Distance Ambulated (ft) 600 ft  Activity Response Tolerated well   Patient received in supine agreeable to participate in mobility despite abdominal pain. Ambulated in hallway mod I with steady gait. Returned to room without complaint or incident. Was left dangling EOB with all needs met, call bell in reach.   10/15/2021 10:15 AM  [name], CMS, [place]  VGCYO:[phone] Office: [phone]

## 2021-10-15 NOTE — Hospital Course (Signed)
56 y.o. female with a history of with inferior STEMI in 2015, insulin-dependent diabetes mellitus, hypertension, and NASH. Patient presented secondary to nausea, vomiting, generalized aches and was found to be in septic shock from right emphysematous pyelonephritis. Patient empirically treated with Ceftriaxone and admitted to the ICU for vasopressor support after initial resuscitation with IV fluids failed. Urine and blood cultures were positive for E. Coli. Antibiotics transitioned to Cefazolin prior to transitioning back to Ceftriaxone. Perinephric aspiration performed on 6/26 by IR.

## 2021-10-16 DIAGNOSIS — J9601 Acute respiratory failure with hypoxia: Secondary | ICD-10-CM | POA: Diagnosis not present

## 2021-10-16 DIAGNOSIS — N12 Tubulo-interstitial nephritis, not specified as acute or chronic: Secondary | ICD-10-CM | POA: Diagnosis not present

## 2021-10-16 LAB — COMPREHENSIVE METABOLIC PANEL
ALT: 44 U/L (ref 0–44)
AST: 35 U/L (ref 15–41)
Albumin: 1.9 g/dL — ABNORMAL LOW (ref 3.5–5.0)
Alkaline Phosphatase: 148 U/L — ABNORMAL HIGH (ref 38–126)
Anion gap: 10 (ref 5–15)
BUN: 14 mg/dL (ref 6–20)
CO2: 27 mmol/L (ref 22–32)
Calcium: 8.5 mg/dL — ABNORMAL LOW (ref 8.9–10.3)
Chloride: 96 mmol/L — ABNORMAL LOW (ref 98–111)
Creatinine, Ser: 1.39 mg/dL — ABNORMAL HIGH (ref 0.44–1.00)
GFR, Estimated: 45 mL/min — ABNORMAL LOW (ref >60)
Glucose, Bld: 249 mg/dL — ABNORMAL HIGH (ref 70–99)
Potassium: 4.3 mmol/L (ref 3.5–5.1)
Sodium: 133 mmol/L — ABNORMAL LOW (ref 135–145)
Total Bilirubin: 0.3 mg/dL (ref 0.3–1.2)
Total Protein: 5.9 g/dL — ABNORMAL LOW (ref 6.5–8.1)

## 2021-10-16 LAB — CBC
HCT: 23.2 % — ABNORMAL LOW (ref 36.0–46.0)
Hemoglobin: 8.1 g/dL — ABNORMAL LOW (ref 12.0–15.0)
MCH: 32.9 pg (ref 26.0–34.0)
MCHC: 34.9 g/dL (ref 30.0–36.0)
MCV: 94.3 fL (ref 80.0–100.0)
Platelets: 751 10*3/uL — ABNORMAL HIGH (ref 150–400)
RBC: 2.46 MIL/uL — ABNORMAL LOW (ref 3.87–5.11)
RDW: 14.9 % (ref 11.5–15.5)
WBC: 17.8 10*3/uL — ABNORMAL HIGH (ref 4.0–10.5)
nRBC: 0.1 % (ref 0.0–0.2)

## 2021-10-16 LAB — GLUCOSE, CAPILLARY
Glucose-Capillary: 223 mg/dL — ABNORMAL HIGH (ref 70–99)
Glucose-Capillary: 183 mg/dL — ABNORMAL HIGH (ref 70–99)
Glucose-Capillary: 159 mg/dL — ABNORMAL HIGH (ref 70–99)
Glucose-Capillary: 179 mg/dL — ABNORMAL HIGH (ref 70–99)

## 2021-10-16 MED ORDER — SODIUM CHLORIDE 0.9 % IV SOLN: INTRAVENOUS | Status: DC

## 2021-10-16 MED ORDER — LACTATED RINGERS IV SOLN: INTRAVENOUS | Status: DC

## 2021-10-16 NOTE — Progress Notes (Signed)
Physical Therapy Treatment Patient Details Name: [name] MRN: [id number] DOB: Jun 13, 1965 Today's Date: 10/16/2021   History of Present Illness Pt is a 56 y.o. female admitted 10/04/21 with c/o SOB, chest pain, N/V/D, loss of appetite. 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 S/p R perinephric aspiration 6/26. PMH includes CAD, DM, fibromyalgia, neuropathy, ischemic cardiomyopathy, sinus bradycardia, NASH.    PT Comments    Pt is making good progress with Mobility specialist on increasing ambulation endurance, however pt continues to feel unsteady on her feet. Worked on ambulation without AD, weight shifting and single LE stance until back pain increased and pt requested back to bed. D/c plans remain appropriate at this time. PT will continue to follow acutely.    Recommendations for follow up therapy are one component of a multi-disciplinary discharge planning process, led by the attending physician.  Recommendations may be updated based on patient status, additional functional criteria and insurance authorization.  Follow Up Recommendations  Home health PT     Assistance Recommended at Discharge Intermittent Supervision/Assistance  Patient can return home with the following A little help with walking and/or transfers;A little help with bathing/dressing/bathroom;Assistance with cooking/housework;Assist for transportation;Help with stairs or ramp for entrance   Equipment Recommendations  Rolling walker (2 wheels)    Recommendations for Other Services OT consult     Precautions / Restrictions Precautions Precautions: Fall;Other (comment) Precaution Comments: hypotension Restrictions Weight Bearing Restrictions: No     Mobility  Bed Mobility Overal bed mobility: Needs Assistance Bed Mobility: Sit to Supine       Sit to supine: Min guard, HOB elevated   General bed mobility  comments: min guard for safety, increased time and effort to get into bed, able to scoot up towards HoB    Transfers Overall transfer level: Needs assistance Equipment used: Rolling walker (2 wheels) Transfers: Sit to/from Stand Sit to Stand: Supervision           General transfer comment: No assist needed, increased time due to pain in abdomen    Ambulation/Gait Ambulation/Gait assistance: Min guard, Min assist Gait Distance (Feet): 40 Feet Assistive device: Rolling walker (2 wheels), 1 person hand held assist, 2 person hand held assist Gait Pattern/deviations: Step-through pattern, Decreased stride length, Trunk flexed Gait velocity: Decreased Gait velocity interpretation: <1.31 ft/sec, indicative of household ambulator Pre-gait activities: weight shifting with increased time on RLE, General Gait Details: Slow, fatigued gait with RW and intermittent min guard for balance; continued gait trial at end of ambulation without DME, pt reliant on HHA and frequently reaching to furniture/hallway rail for added stability, intermittent minA for balance       Balance Overall balance assessment: Needs assistance Sitting-balance support: No upper extremity supported, Feet unsupported Sitting balance-Leahy Scale: Good Sitting balance - Comments: donned socks seated on EOB with sock aide   Standing balance support: During functional activity, Reliant on assistive device for balance Standing balance-Leahy Scale: Fair Standing balance comment: able to static stand without UE support               High Level Balance Comments: worked on single LE stance first on L and then on R pt with increased pain on R single leg stance            Cognition Arousal/Alertness: Awake/alert Behavior During Therapy: Flat affect Overall Cognitive Status: Within Functional Limits for tasks assessed  Exercises  Single leg stance, x2 each  side, weightshifting 4 min    General Comments General comments (skin integrity, edema, etc.): VSS on RA      Pertinent Vitals/Pain Pain Assessment Pain Assessment: 0-10 Pain Score: 8  Pain Location: back, BLEs Pain Descriptors / Indicators: Sore, Tiring Pain Intervention(s): Limited activity within patient's tolerance, Monitored during session, Premedicated before session, Repositioned     PT Goals (current goals can now be found in the care plan section) Acute Rehab PT Goals Patient Stated Goal: return home PT Goal Formulation: With patient/family Time For Goal Achievement: 10/23/21 Potential to Achieve Goals: Good Progress towards PT goals: Progressing toward goals    Frequency    Min 3X/week      PT Plan Current plan remains appropriate       AM-PAC PT "6 Clicks" Mobility   Outcome Measure  Help needed turning from your back to your side while in a flat bed without using bedrails?: None Help needed moving from lying on your back to sitting on the side of a flat bed without using bedrails?: A Little Help needed moving to and from a bed to a chair (including a wheelchair)?: A Little Help needed standing up from a chair using your arms (e.g., wheelchair or bedside chair)?: A Little Help needed to walk in hospital room?: A Little Help needed climbing 3-5 steps with a railing? : A Little 6 Click Score: 19    End of Session Equipment Utilized During Treatment: Gait belt Activity Tolerance: Patient tolerated treatment well;Patient limited by fatigue Patient left: with call bell/phone within reach;in bed Nurse Communication: Mobility status PT Visit Diagnosis: Other abnormalities of gait and mobility (R26.89);Muscle weakness (generalized) (M62.81);Pain Pain - part of body:  (back)     Time: 5974-1638 PT Time Calculation (min) (ACUTE ONLY): 22 min  Charges:  $Therapeutic Activity: 8-22 mins                     Maurizio Geno B. Migdalia Dk PT, DPT Acute  Rehabilitation Services Please use secure chat or  Call Office [phone]    [place] 10/16/2021, 1:59 PM

## 2021-10-16 NOTE — Progress Notes (Addendum)
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  Not currently on supplemental oxygen.  Has been working with physical therapy and has been ambulating.  Leukocytosis stable today.  She asked me about her Foley catheter today.  Principal Problem:   Emphysematous pyelitis Active Problems:   Insulin-requiring or dependent type II diabetes mellitus (HCC)   Leukocytosis   CAD (coronary artery disease)   Hypotension   Thrombocytopenia (HCC)   AKI (acute kidney injury) ([place])   Elevated troponin   Acidosis   Cirrhosis ([place])   Septic shock (HCC)   Acute respiratory failure with hypoxia ([place])   E coli bacteremia  Current Facility-Administered Medications  Medication Dose Route Frequency Provider Last Rate Last Admin   0.9 %  sodium chloride infusion (Manually program via Guardrails IV Fluids)   Intravenous Once Candee Furbish, MD   Held at 10/05/21 0517   0.9 %  sodium chloride infusion  250 mL Intravenous Continuous Candee Furbish, MD   Held at 10/05/21 0517   0.9 %  sodium chloride infusion   Intravenous Continuous Donne Hazel, MD 75 mL/hr at 10/16/21 0801 New Bag at 10/16/21 0801   acetaminophen (TYLENOL) tablet 650 mg  650 mg Oral Q6H PRN Vianne Bulls, MD   650 mg at 10/15/21 2041   Or   acetaminophen (TYLENOL) suppository 650 mg  650 mg Rectal Q6H PRN Opyd, Ilene Qua, MD       cefTRIAXone (ROCEPHIN) 2 g in sodium chloride 0.9 % 100 mL IVPB  2 g Intravenous Q24H Vu, Trung T, MD 200 mL/hr at 10/15/21 1840 2 g at 10/15/21 1840   Chlorhexidine Gluconate Cloth 2 % PADS 6 each  6 each Topical Daily Candee Furbish, MD    6 each at 10/16/21 1114   DULoxetine (CYMBALTA) DR capsule 60 mg  60 mg Oral QHS Estill Cotta, NP   60 mg at 10/15/21 2118   gabapentin (NEURONTIN) capsule 300 mg  300 mg Oral TID Mariel Aloe, MD   300 mg at 10/16/21 1109   insulin aspart (novoLOG) injection 0-15 Units  0-15 Units Subcutaneous TID WC Donne Hazel, MD   3 Units at 10/16/21 1223   insulin aspart (novoLOG) injection 0-5 Units  0-5 Units Subcutaneous QHS Donne Hazel, MD   4 Units at 10/15/21 2119   insulin aspart (novoLOG) injection 10 Units  10 Units Subcutaneous TID WC Mariel Aloe, MD   10 Units at 10/16/21 1223   insulin glargine-yfgn (SEMGLEE) injection 20 Units  20 Units Subcutaneous BID Donne Hazel, MD   20 Units at 10/16/21 1109   lip balm (CARMEX) ointment   Topical PRN Collene Gobble, MD       multivitamin with minerals tablet 1 tablet  1 tablet Oral Daily Mariel Aloe, MD   1 tablet at 10/16/21 1109   ondansetron (ZOFRAN) tablet 4 mg  4 mg Oral Q6H PRN Opyd, Ilene Qua, MD       Or   ondansetron (ZOFRAN) injection 4 mg  4 mg Intravenous Q6H PRN Opyd, Ilene Qua, MD       Oral care mouth rinse  15 mL  Mouth Rinse PRN Margaretha Seeds, MD       pantoprazole (PROTONIX) EC tablet 40 mg  40 mg Oral Daily Candee Furbish, MD   40 mg at 10/16/21 1109   polyethylene glycol (MIRALAX / GLYCOLAX) packet 17 g  17 g Oral BID Estill Cotta, NP   17 g at 10/16/21 1109   senna-docusate (Senokot-S) tablet 1 tablet  1 tablet Oral BID Estill Cotta, NP   1 tablet at 10/15/21 2118   sodium chloride flush (NS) 0.9 % injection 3 mL  3 mL Intravenous Q12H Opyd, Ilene Qua, MD   3 mL at 10/16/21 1113   sorbitol, milk of mag, mineral oil, glycerin (SMOG) enema  960 mL Rectal Once Mariel Aloe, MD       traMADol Veatrice Bourbon) tablet 50 mg  50 mg Oral Q12H PRN Mauri Brooklyn, MD   50 mg at 10/16/21 1110     Objective: Vital: Vitals:   10/16/21 0342 10/16/21 0343 10/16/21 0940 10/16/21 1640  BP:  (!) 104/59 109/62  (!) 102/56  Pulse:  71 69 84  Resp:  '16 16 17  '$ Temp:  98.1 F (36.7 C)    TempSrc:      SpO2:  90% 91% 93%  Weight: 71.7 kg     Height:       I/Os: I/O last 3 completed shifts: In: 2360 [P.O.:1960; IV Piggyback:400] Out: 3650 [Urine:3650]  Physical Exam:  General: Patient is in no apparent distress Lungs: Normal respiratory effort, chest expands symmetrically. GI: The abdomen is soft and nontender without mass. Foley: Draining clear yellow urine Ext: lower extremities symmetric  Lab Results: Recent Labs    10/14/21 0201 10/15/21 0357 10/16/21 0320  WBC 15.4* 18.0* 17.8*  HGB 8.3* 8.5* 8.1*  HCT 24.1* 24.4* 23.2*   Recent Labs    10/15/21 0357 10/16/21 0320  NA 134* 133*  K 5.1 4.3  CL 95* 96*  CO2 29 27  GLUCOSE 229* 249*  BUN 16 14  CREATININE 1.19* 1.39*  CALCIUM 8.5* 8.5*   No results for input(s): "LABPT", "INR" in the last 72 hours. No results for input(s): "LABURIN" in the last 72 hours. Results for orders placed or performed during the hospital encounter of 10/04/21  Blood culture (routine x 2)     Status: Abnormal   Collection Time: 10/05/21  2:53 AM   Specimen: BLOOD  Result Value Ref Range Status   Specimen Description BLOOD RIGHT ARM  Final   Special Requests   Final    BOTTLES DRAWN AEROBIC AND ANAEROBIC Blood Culture results may not be optimal due to an inadequate volume of blood received in culture bottles   Culture  Setup Time   Final    GRAM NEGATIVE RODS IN BOTH AEROBIC AND ANAEROBIC BOTTLES CRITICAL VALUE NOTED.  VALUE IS CONSISTENT WITH PREVIOUSLY REPORTED AND CALLED VALUE.    Culture (A)  Final    ESCHERICHIA COLI SUSCEPTIBILITIES PERFORMED ON PREVIOUS CULTURE WITHIN THE LAST 5 DAYS. Performed at [facility], [place] [address], [place], [place] [address]    Report Status 10/07/2021 FINAL  Final  Blood culture (routine x 2)     Status: Abnormal   Collection Time: 10/05/21  2:53 AM   Specimen: BLOOD  Result Value Ref Range  Status   Specimen Description BLOOD LEFT HAND  Final   Special Requests   Final    BOTTLES DRAWN AEROBIC AND ANAEROBIC Blood Culture results may not be  optimal due to an inadequate volume of blood received in culture bottles   Culture  Setup Time   Final    GRAM NEGATIVE RODS IN BOTH AEROBIC AND ANAEROBIC BOTTLES CRITICAL RESULT CALLED TO, READ BACK BY AND VERIFIED WITH: PHARMD K.AMEND AT 1636 ON 10/05/2021 BY T.SAAD. Performed at [facility], [place] [address], [place], [place] [address]    Culture ESCHERICHIA COLI (A)  Final   Report Status 10/07/2021 FINAL  Final   Organism ID, Bacteria ESCHERICHIA COLI  Final      Susceptibility   Escherichia coli - MIC*    AMPICILLIN >=32 RESISTANT Resistant     CEFAZOLIN <=4 SENSITIVE Sensitive     CEFEPIME <=0.12 SENSITIVE Sensitive     CEFTAZIDIME <=1 SENSITIVE Sensitive     CEFTRIAXONE <=0.25 SENSITIVE Sensitive     CIPROFLOXACIN <=0.25 SENSITIVE Sensitive     GENTAMICIN <=1 SENSITIVE Sensitive     IMIPENEM <=0.25 SENSITIVE Sensitive     TRIMETH/SULFA <=20 SENSITIVE Sensitive     AMPICILLIN/SULBACTAM 16 INTERMEDIATE Intermediate     PIP/TAZO <=4 SENSITIVE Sensitive     * ESCHERICHIA COLI  SARS Coronavirus 2 by RT PCR (hospital order, performed in [place] hospital lab) *cepheid single result test* Anterior Nasal Swab     Status: None   Collection Time: 10/05/21  2:53 AM   Specimen: Anterior Nasal Swab  Result Value Ref Range Status   SARS Coronavirus 2 by RT PCR NEGATIVE NEGATIVE Final    Comment: (NOTE) SARS-CoV-2 target nucleic acids are NOT DETECTED.  The SARS-CoV-2 RNA is generally detectable in upper and lower respiratory specimens during the acute phase of infection. The lowest concentration of SARS-CoV-2 viral copies this assay can detect is 250 copies / mL. A negative result does not preclude SARS-CoV-2 infection and should not be used as the sole basis for treatment or other patient management decisions.  A negative  result may occur with improper specimen collection / handling, submission of specimen other than nasopharyngeal swab, presence of viral mutation(s) within the areas targeted by this assay, and inadequate number of viral copies (<250 copies / mL). A negative result must be combined with clinical observations, patient history, and epidemiological information.  Fact Sheet for Patients:   https://www.patel.info/  Fact Sheet for Healthcare Providers: https://hall.com/  This test is not yet approved or  cleared by the [place] FDA and has been authorized for detection and/or diagnosis of SARS-CoV-2 by FDA under an Emergency Use Authorization (EUA).  This EUA will remain in effect (meaning this test can be used) for the duration of the COVID-19 declaration under Section 564(b)(1) of the Act, 21 U.S.C. section 360bbb-3(b)(1), unless the authorization is terminated or revoked sooner.  Performed at [facility], [place] [address], [place], [place] [address]   Blood Culture ID Panel (Reflexed)     Status: Abnormal   Collection Time: 10/05/21  2:53 AM  Result Value Ref Range Status   Enterococcus faecalis NOT DETECTED NOT DETECTED Final   Enterococcus Faecium NOT DETECTED NOT DETECTED Final   Listeria monocytogenes NOT DETECTED NOT DETECTED Final   Staphylococcus species NOT DETECTED NOT DETECTED Final   Staphylococcus aureus (BCID) NOT DETECTED NOT DETECTED Final   Staphylococcus epidermidis NOT DETECTED NOT DETECTED Final   Staphylococcus lugdunensis NOT DETECTED NOT DETECTED Final   Streptococcus species NOT DETECTED NOT DETECTED Final   Streptococcus agalactiae NOT DETECTED NOT DETECTED Final   Streptococcus pneumoniae NOT DETECTED NOT DETECTED Final  Streptococcus pyogenes NOT DETECTED NOT DETECTED Final   A.calcoaceticus-baumannii NOT DETECTED NOT DETECTED Final   Bacteroides fragilis NOT DETECTED NOT DETECTED Final    Enterobacterales DETECTED (A) NOT DETECTED Final    Comment: Enterobacterales represent a large order of gram negative bacteria, not a single organism. CRITICAL RESULT CALLED TO, READ BACK BY AND VERIFIED WITH: PHARMD K.AMEND AT 1636 ON 10/05/2021 BY T.SAAD.    Enterobacter cloacae complex NOT DETECTED NOT DETECTED Final   Escherichia coli DETECTED (A) NOT DETECTED Final    Comment: CRITICAL RESULT CALLED TO, READ BACK BY AND VERIFIED WITH: PHARMD K.AMEND AT 1636 ON 10/05/2021 BY T.SAAD.    Klebsiella aerogenes NOT DETECTED NOT DETECTED Final   Klebsiella oxytoca NOT DETECTED NOT DETECTED Final   Klebsiella pneumoniae NOT DETECTED NOT DETECTED Final   Proteus species NOT DETECTED NOT DETECTED Final   Salmonella species NOT DETECTED NOT DETECTED Final   Serratia marcescens NOT DETECTED NOT DETECTED Final   Haemophilus influenzae NOT DETECTED NOT DETECTED Final   Neisseria meningitidis NOT DETECTED NOT DETECTED Final   Pseudomonas aeruginosa NOT DETECTED NOT DETECTED Final   Stenotrophomonas maltophilia NOT DETECTED NOT DETECTED Final   Candida albicans NOT DETECTED NOT DETECTED Final   Candida auris NOT DETECTED NOT DETECTED Final   Candida glabrata NOT DETECTED NOT DETECTED Final   Candida krusei NOT DETECTED NOT DETECTED Final   Candida parapsilosis NOT DETECTED NOT DETECTED Final   Candida tropicalis NOT DETECTED NOT DETECTED Final   Cryptococcus neoformans/gattii NOT DETECTED NOT DETECTED Final   CTX-M ESBL NOT DETECTED NOT DETECTED Final   Carbapenem resistance IMP NOT DETECTED NOT DETECTED Final   Carbapenem resistance KPC NOT DETECTED NOT DETECTED Final   Carbapenem resistance NDM NOT DETECTED NOT DETECTED Final   Carbapenem resist OXA 48 LIKE NOT DETECTED NOT DETECTED Final   Carbapenem resistance VIM NOT DETECTED NOT DETECTED Final    Comment: Performed at [facility], [address], [place], [place] [address]  Urine Culture     Status: Abnormal   Collection Time:  10/05/21  3:11 AM   Specimen: Urine, Clean Catch  Result Value Ref Range Status   Specimen Description URINE, CLEAN CATCH  Final   Special Requests   Final    NONE Performed at [facility], [place] [address], [place], [place] [address]    Culture >=100,000 COLONIES/mL ESCHERICHIA COLI (A)  Final   Report Status 10/07/2021 FINAL  Final   Organism ID, Bacteria ESCHERICHIA COLI (A)  Final      Susceptibility   Escherichia coli - MIC*    AMPICILLIN >=32 RESISTANT Resistant     CEFAZOLIN <=4 SENSITIVE Sensitive     CEFEPIME <=0.12 SENSITIVE Sensitive     CEFTRIAXONE <=0.25 SENSITIVE Sensitive     CIPROFLOXACIN <=0.25 SENSITIVE Sensitive     GENTAMICIN <=1 SENSITIVE Sensitive     IMIPENEM <=0.25 SENSITIVE Sensitive     NITROFURANTOIN <=16 SENSITIVE Sensitive     TRIMETH/SULFA <=20 SENSITIVE Sensitive     AMPICILLIN/SULBACTAM 16 INTERMEDIATE Intermediate     PIP/TAZO <=4 SENSITIVE Sensitive     * >=100,000 COLONIES/mL ESCHERICHIA COLI  MRSA Next Gen by PCR, Nasal     Status: None   Collection Time: 10/05/21  6:01 AM   Specimen: Nasal Mucosa; Nasal Swab  Result Value Ref Range Status   MRSA by PCR Next Gen NOT DETECTED NOT DETECTED Final    Comment: (NOTE) The GeneXpert MRSA Assay (FDA approved for  NASAL specimens only), is one component of a comprehensive MRSA colonization surveillance program. It is not intended to diagnose MRSA infection nor to guide or monitor treatment for MRSA infections. Test performance is not FDA approved in patients less than 62 years old. Performed at [facility], [place] [address], [place], [place] [address]   Aerobic/Anaerobic Culture w Gram Stain (surgical/deep wound)     Status: None (Preliminary result)   Collection Time: 10/13/21 10:52 AM   Specimen: Abscess  Result Value Ref Range Status   Specimen Description ABSCESS KIDNEY  Final   Special Requests NONE  Final   Gram Stain   Final    ABUNDANT WBC PRESENT, PREDOMINANTLY PMN RARE GRAM  NEGATIVE RODS Performed at [facility], [place] [address] [place], [place] [address]    Culture   Final    RARE ESCHERICHIA COLI NO ANAEROBES ISOLATED; CULTURE IN PROGRESS FOR 5 DAYS    Report Status PENDING  Incomplete   Organism ID, Bacteria ESCHERICHIA COLI  Final      Susceptibility   Escherichia coli - MIC*    AMPICILLIN >=32 RESISTANT Resistant     CEFAZOLIN <=4 SENSITIVE Sensitive     CEFEPIME <=0.12 SENSITIVE Sensitive     CEFTAZIDIME <=1 SENSITIVE Sensitive     CEFTRIAXONE <=0.25 SENSITIVE Sensitive     CIPROFLOXACIN <=0.25 SENSITIVE Sensitive     GENTAMICIN <=1 SENSITIVE Sensitive     IMIPENEM <=0.25 SENSITIVE Sensitive     TRIMETH/SULFA <=20 SENSITIVE Sensitive     AMPICILLIN/SULBACTAM 16 INTERMEDIATE Intermediate     PIP/TAZO <=4 SENSITIVE Sensitive     * RARE ESCHERICHIA COLI    Studies/Results: DG Shoulder Left  Result Date: 10/15/2021 CLINICAL DATA:  Left shoulder pain EXAM: LEFT SHOULDER - 2+ VIEW COMPARISON:  MRI 11/14/2013 FINDINGS: There is no evidence of fracture or dislocation. There is no evidence of arthropathy or other focal bone abnormality. Soft tissues are unremarkable. IMPRESSION: Negative. Electronically Signed   By: Davina Poke D.O.   On: 10/15/2021 12:01    Assessment: Right emphysematous pyelonephritis  Plan: Agree with continuing antibiotics.  Agree with infectious disease, Dr. Gale Journey, to continue ceftriaxone.  Will likely be discharged with plans for ceftriaxone outpatient.  Consider repeat CT on Sunday (earlier if interval clinical change) to evaluate for any interval change and to specifically rule out any new abscess/collections that can be drained so that could be drained on Monday if present.  This will also help determine appropriateness of outpatient management.  Hopeful that this will resolve with antibiotics alone and can avoid nephrectomy.  Would not discontinue antibiotics until signs of complete resolution on CT.  In regards to  her Foley catheter, would recommend continuing for maximal genitourinary decompression in the setting of severe infection.  Plan for removal once she has clinically improved.  Potential removal depending on CT findings   Link Snuffer, MD Urology 10/16/2021, 5:50 PM

## 2021-10-16 NOTE — Progress Notes (Signed)
[facility] for Infectious Disease  Date of Admission:  10/04/2021     Lines:  Peripheral iv 6/18-c foley catheter   Abx: 6/8-20; 6/21-c ceftriaxone   6/20-21 cefazolin                                                             Assessment: 56 yo female with cad, dm2, nash cirrhosis, hx cdiff 10 years prior to this admission, admitted 6/17 with n/v/diarrhea, myalgia, s/p splenectomy for ?lipemic spleenfound to meet severe sepsis/septic shock criteria in setting emphysematous right sided pyelonephritis and ecoli bacteremia, complicated by chf with associated respiratory failure, thrombocytopenia   6/18 admission bcx ecoli (R cefazolin MIC </=4 for blood); of note, our labs does not have capability to test for mic less than 4 6/18 ucx ecoli (S cefazolin)   Urology following.   6/20 ct abd/pelv repeat mixed changes with some extension and improvement  S/p IR perc drain 6/26 unable to leave drain; only 7 cc pus removed. Cx ngtd in setting already on abx     Septic shock related complications: Aki resolved Thrombocytopenia resolved Chf/hypoxemic respiratory failure resolved Elevated troponin in setting sepsis/aki/chf. Demand ischemia, improved/resolved     ---------- 6/29 assessment Increased reactive thrombocytosis Stable gi sx Ambulating/working with pt/ot well  I would say clinically improving  If she remains like this through this weekend reasonable to discharge. Will revisit next week monday   Plan: Continue ceftriaxone Plan opat for 3 weeks ceftriaxone starting Monday if she continues to be stable/improving Discussed with primary team  I spent more than 35 minute reviewing data/chart, and coordinating care and >50% direct face to face time providing counseling/discussing diagnostics/treatment plan with patient     Principal Problem:   Emphysematous pyelitis Active Problems:   Insulin-requiring or dependent type II diabetes mellitus (HCC)    Leukocytosis   CAD (coronary artery disease)   Hypotension   Thrombocytopenia (HCC)   AKI (acute kidney injury) ([place])   Elevated troponin   Acidosis   Cirrhosis (HCC)   Septic shock (HCC)   Acute respiratory failure with hypoxia (HCC)   E coli bacteremia   Allergies  Allergen Reactions   Aspartame And Phenylalanine Anaphylaxis, Shortness Of Breath and Swelling    Throat swells  ANY ARTIFICAL SWEETNERS   Toradol [Ketorolac Tromethamine] Other (See Comments)    Face & neck flushed red & pt felt very hot after IV adm.   Sucralose Rash    Pt.s face broke out in rash after drinking Breeza.  Pt. Claims it is from the artificial sweetener.      Codeine Swelling   Dilaudid [Hydromorphone Hcl] Itching   Hydrocodone Itching   Oxycodone Itching    Scheduled Meds:  sodium chloride   Intravenous Once   Chlorhexidine Gluconate Cloth  6 each Topical Daily   DULoxetine  60 mg Oral QHS   feeding supplement  237 mL Oral TID BM   gabapentin  300 mg Oral TID   insulin aspart  0-15 Units Subcutaneous TID WC   insulin aspart  0-5 Units Subcutaneous QHS   insulin aspart  10 Units Subcutaneous TID WC   insulin glargine-yfgn  20 Units Subcutaneous BID   multivitamin with minerals  1 tablet Oral Daily  pantoprazole  40 mg Oral Daily   polyethylene glycol  17 g Oral BID   senna-docusate  1 tablet Oral BID   sodium chloride flush  3 mL Intravenous Q12H   sorbitol, milk of mag, mineral oil, glycerin (SMOG) enema  960 mL Rectal Once   Continuous Infusions:  sodium chloride Stopped (10/05/21 0517)   sodium chloride 75 mL/hr at 10/16/21 0801   cefTRIAXone (ROCEPHIN)  IV 2 g (10/15/21 1840)   PRN Meds:.acetaminophen **OR** acetaminophen, lip balm, ondansetron **OR** ondansetron (ZOFRAN) IV, mouth rinse, traMADol   SUBJECTIVE: Afebrile Stable leukocytosis  Doing well pt/ot Eating decent No n/v/diarrhea    Review of Systems: ROS All other ROS was negative, except mentioned  above     OBJECTIVE: Vitals:   10/15/21 2100 10/16/21 0342 10/16/21 0343 10/16/21 0940  BP:   (!) 104/59 109/62  Pulse:   71 69  Resp:   16 16  Temp: 98.9 F (37.2 C)  98.1 F (36.7 C)   TempSrc:      SpO2:   90% 91%  Weight:  71.7 kg    Height:       Body mass index is 28.45 kg/m.  Physical Exam General/constitutional: no distress, pleasant; ambulating with pt/ot HEENT: Normocephalic, PER, Conj Clear, EOMI, Oropharynx clear Neck supple CV: rrr no mrg Lungs: clear to auscultation, normal respiratory effort Neuro: nonfocal    Lab Results Lab Results  Component Value Date   WBC 17.8 (H) 10/16/2021   HGB 8.1 (L) 10/16/2021   HCT 23.2 (L) 10/16/2021   MCV 94.3 10/16/2021   PLT 751 (H) 10/16/2021    Lab Results  Component Value Date   CREATININE 1.39 (H) 10/16/2021   BUN 14 10/16/2021   NA 133 (L) 10/16/2021   K 4.3 10/16/2021   CL 96 (L) 10/16/2021   CO2 27 10/16/2021    Lab Results  Component Value Date   ALT 44 10/16/2021   AST 35 10/16/2021   ALKPHOS 148 (H) 10/16/2021   BILITOT 0.3 10/16/2021      Microbiology: Recent Results (from the past 240 hour(s))  Aerobic/Anaerobic Culture w Gram Stain (surgical/deep wound)     Status: None (Preliminary result)   Collection Time: 10/13/21 10:52 AM   Specimen: Abscess  Result Value Ref Range Status   Specimen Description ABSCESS KIDNEY  Final   Special Requests NONE  Final   Gram Stain   Final    ABUNDANT WBC PRESENT, PREDOMINANTLY PMN RARE GRAM NEGATIVE RODS Performed at [facility], [address] [place], [place] [address]    Culture   Final    RARE ESCHERICHIA COLI NO ANAEROBES ISOLATED; CULTURE IN PROGRESS FOR 5 DAYS    Report Status PENDING  Incomplete   Organism ID, Bacteria ESCHERICHIA COLI  Final      Susceptibility   Escherichia coli - MIC*    AMPICILLIN >=32 RESISTANT Resistant     CEFAZOLIN <=4 SENSITIVE Sensitive     CEFEPIME <=0.12 SENSITIVE Sensitive     CEFTAZIDIME <=1  SENSITIVE Sensitive     CEFTRIAXONE <=0.25 SENSITIVE Sensitive     CIPROFLOXACIN <=0.25 SENSITIVE Sensitive     GENTAMICIN <=1 SENSITIVE Sensitive     IMIPENEM <=0.25 SENSITIVE Sensitive     TRIMETH/SULFA <=20 SENSITIVE Sensitive     AMPICILLIN/SULBACTAM 16 INTERMEDIATE Intermediate     PIP/TAZO <=4 SENSITIVE Sensitive     * RARE ESCHERICHIA COLI     Serology:   Imaging: If present, new  imagings (plain films, ct scans, and mri) have been personally visualized and interpreted; radiology reports have been reviewed. Decision making incorporated into the Impression / Recommendations.   6/20 ct abd pelv without contrast Persistent findings of subcapsular gas in the right kidney although the collections appear smaller there has been some extension into the Perirenal fatty tissues which is new from the prior exam.   Scattered lymph nodes as described similar to that seen on the prior exam.   Significant increase in the degree of infiltrate with associated effusions in the bases bilaterally.         6/18 ct abd pelv without contrast 1. Subcapsular and parenchymal gas collections within the right kidney. Unless there is been recent biopsy or other procedure, this is likely to represent infection with gas-forming organism. Inflammatory stranding suggested along the right pericolic gutter. 2. 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 3. Aortic atherosclerosis.   6/17 cxr No active cardiopulmonary disease.  Jabier Mutton, [facility] [place] [phone] pager    10/16/2021, 1:36 PM

## 2021-10-16 NOTE — Progress Notes (Signed)
Nutrition Follow-up  DOCUMENTATION CODES:   Not applicable  INTERVENTION:   [employer] Breakfast Essentials TID, each packet mixed with 8 ounces of 2% milk provides 13 grams of protein and 260 calories.  D/C Ensure Enlive  Continue Dysphagia 3, easy to chew diet  Continue with MVI with Minerals daily  NUTRITION DIAGNOSIS:   Inadequate oral intake related to acute illness, nausea, vomiting, decreased appetite as evidenced by meal completion < 25%.  Being addressed by modifying supplements  GOAL:   Patient will meet greater than or equal to 90% of their needs  Progressing  MONITOR:   PO intake, Supplement acceptance  REASON FOR ASSESSMENT:   Consult Assessment of nutrition requirement/status  ASSESSMENT:   56 yo female admitted with emphysematous pyelonephritis. PMH includes CAD, DM, HTN, NASH, ischemic cardiomyopathy, GERD, HLD.  6/26 IR perc drain, unable to leave drain, only 7 mL pus removed  Pt reports significant pain that radiates from the back around to the front. Pt reports difficulty sleeping due to pain, also reports that this is affecting her appetite and po intake. Pt does have pain regimen in place, pt has discussed her pain concerns with MD as well  Recorded po intake 0-100% of meals; pt ate at 100% of breakfast this AM but reports this was only a  banana.   Pt is edentulous, has top dentures here but they are not fitting well. Bottom dentures at home but has not been wearing these as they have not been fitting well either. Pt needs soft foods. Difficulty in eating part of lunch today as the pork chop was cut up but into very large chunks and meat was dry. Pt was able to eat mashed sweet potatoes and collard greens. Discussed entree/protein options with pt, pt doing a good job of trying to order items from the menu that she know she can tolerate.   Pt is not tolerating the Ensure supplements; pt cannot stand the smell or taste. Discussed other supplement  options with pt; pt reports she has tried [employer] at home and does like this. Pt agreeable to receiving this supplement TID  Pt does acknowledge wt loss. Reports 1.5 years ago she weighed a little over 200 pounds; current wt 155 pounds. Pt reports this was a combination of intentional and unintentional wt loss  Admit wt: 78.1 kg Current wt: 71.7 kg Lowest wt: 69.7 kg  Labs: sodium 133 (L), CBGs 132-314 Meds: ss novolog, novolog with meals, semglee, MVI with Minerals   Diet Order:   Diet Order             DIET DYS 3 Room service appropriate? Yes; Fluid consistency: Thin  Diet effective now                   EDUCATION NEEDS:   Not appropriate for education at this time  Skin:  Skin Assessment: Reviewed RN Assessment  Last BM:  6/28  Height:   Ht Readings from Last 1 Encounters:  10/06/21 5' 2.5" (1.588 m)    Weight:   Wt Readings from Last 1 Encounters:  10/16/21 71.7 kg    Ideal Body Weight:  51.1 kg  BMI:  Body mass index is 28.45 kg/m.  Estimated Nutritional Needs:   Kcal:  1900-2100  Protein:  90-100 gm  Fluid:  1.9-2.1 L   Kerman Passey MS, RDN, LDN, CNSC Registered Dietitian III Clinical Nutrition RD Pager and On-Call Pager Number Located in [place]

## 2021-10-16 NOTE — Progress Notes (Signed)
Mobility Specialist Criteria Algorithm Info.    10/16/21 1000  Mobility  Activity Ambulated with assistance in hallway (to chair after ambulation)  Range of Motion/Exercises Active;All extremities  Level of Assistance Modified independent, requires aide device or extra time  Assistive Device Front wheel walker  Distance Ambulated (ft) 1500 ft (+)  Activity Response Tolerated well   Patient received in supine eager to participate in mobility. Ambulated mod I with steady gait. Returned to room without complaint or incident. Was left in recliner chair with all needs met, call bell in reach.   10/16/2021 12:35 PM  [place] Elvira Langston, [place], [place]  VOZDG:[phone] Office: [phone]

## 2021-10-16 NOTE — Progress Notes (Signed)
Progress Note   Patient: [name] WVP:[id number] DOB: 08/12/1965 DOA: 10/04/2021     11 DOS: the patient was seen and examined on 10/16/2021   Brief hospital course: 56 y.o. female with a history of with inferior STEMI in 2015, insulin-dependent diabetes mellitus, hypertension, and NASH. Patient presented secondary to nausea, vomiting, generalized aches and was found to be in septic shock from right emphysematous pyelonephritis. Patient empirically treated with Ceftriaxone and admitted to the ICU for vasopressor support after initial resuscitation with IV fluids failed. Urine and blood cultures were positive for E. Coli. Antibiotics transitioned to Cefazolin prior to transitioning back to Ceftriaxone. Perinephric aspiration performed on 6/26 by IR.  Assessment and Plan: Septic shock Present on admission. Secondary to pyelonephritis and E. Coli bacteremia. Patient required ICU on admission and norepinephrine for vasopressor support in addition to midodrine. Continued on abx per below.   Right emphysematous pyelonephritis E. Coli bacteremia CT imaging on admission (6/18) was significant for gas collections within the right kidney concerning for emphysematous pyelonephritis. Blood and urine cultures positive for E. Coli bacteria. ID consulted. Patient transitioned to Ancef and is back on Ceftriaxone. WBC trended up but seems to have possibly reached a plateau. Repeat CT abdomen/pelvis with some evidence of disease improvement. IR consulted and performed CT guided right perinephric aspiration on 6/26 yielding 7 mL of purulent fluid; gram stain with GNRs. -ID recommendations: cont rocephin IV -ID recs to plan OPAT for 3 weeks of rocephin starting 7/3 if she continues to be stable or improving   Acute respiratory failure with hypoxia Secondary to pulmonary edema. Patient initially requiring BiPAP for management. Weaned to room air. -Remains on minimal O2 support   Acute pulmonary  edema Secondary to acute heart failure.   Acute on chronic systolic heart failure LVEF 40-45% on 6/19 which is slightly worse from previous baseline. Patient is on lisinopril as an outpatient. Acute failure likely secondary fluid resuscitation.  -Improved with doses of lasix -Cont to follow daily weights and strict in/out   AKI Secondary to ATN from septic shock and resultant hypotension. Peak creatinine of 3.07 which has now trended back down to baseline. -recheck bmet in AM   High anion gap metabolic acidosis Likely secondary to AKI. Resolved with resolution of AKI.   Hypokalemia Treated with potassium supplementation. Resolved. -cont to follow bmet trends   Thrombocytopenia Unknown baseline. Possibly secondary to acute illness if acute vs NASH cirrhosis if chronic. Patient has received 3 units of platelets to date.  -Resolved   Hypoglycemia In setting of sepsis. Resolved.   Leukocytosis Peak of 20,700. Trending down. In setting of sepsis/pyelonephritis/abscesses   NASH Cirrhosis Upper abdominal varices Patient with along history of NASH, now with evidence of cirrhosis on CT imaging. Patient with a remote drinking history but not significant per report. Recent weight loss. Patient will need GI follow-up.   Diabetes mellitus, type 2 Diabetic neuropathy Patient is on insulin glargine 30 units qHS and metformin as an outpatient. She is on gabapentin 1,200 mg qHS for neuropathy. Hemoglobin A1C of 12.5%.  -On Novolog 10 units TID  -Increase semglee to 20 units BID per Diabetic Coordinator recs -Gabapentin 300 mg TID -Cont SSI as needed   Hypocalcemia Secondary to hypoalbuminemia. Corrected calcium is wnl.   Constipation Resolved with dulcolax suppository  L shoulder pain -Reports L shoulder pain after bumping into it during transfer to bed while getting CT earlier -Ordered and reviewed L shoulder xray, negative  Subjective: Complains of continued R flank and  abd pain  Physical Exam: Vitals:   10/16/21 0342 10/16/21 0343 10/16/21 0940 10/16/21 1640  BP:  (!) 104/59 109/62 (!) 102/56  Pulse:  71 69 84  Resp:  '16 16 17  '$ Temp:  98.1 F (36.7 C)    TempSrc:      SpO2:  90% 91% 93%  Weight: 71.7 kg     Height:       General exam: Conversant, in no acute distress Respiratory system: normal chest rise, clear, no audible wheezing Cardiovascular system: regular rhythm, s1-s2 Gastrointestinal system: Nondistended, nontender, pos BS Central nervous system: No seizures, no tremors Extremities: No cyanosis, no joint deformities Skin: No rashes, no pallor Psychiatry: Affect normal // no auditory hallucinations   Data Reviewed:  Labs reviewed: Na 133, K 4.3, Cr 1.39  Family Communication: Pt in room, family not at bedside  Disposition: Status is: Inpatient Remains inpatient appropriate because: Severity of illness  Planned Discharge Destination: Home     Author: Marylu Lund, MD 10/16/2021 5:32 PM  For on call review [email or web address].

## 2021-10-17 DIAGNOSIS — N179 Acute kidney failure, unspecified: Secondary | ICD-10-CM | POA: Diagnosis not present

## 2021-10-17 DIAGNOSIS — N12 Tubulo-interstitial nephritis, not specified as acute or chronic: Secondary | ICD-10-CM | POA: Diagnosis not present

## 2021-10-17 DIAGNOSIS — J9601 Acute respiratory failure with hypoxia: Secondary | ICD-10-CM | POA: Diagnosis not present

## 2021-10-17 LAB — COMPREHENSIVE METABOLIC PANEL
ALT: 39 U/L (ref 0–44)
AST: 27 U/L (ref 15–41)
Albumin: 1.8 g/dL — ABNORMAL LOW (ref 3.5–5.0)
Alkaline Phosphatase: 124 U/L (ref 38–126)
Anion gap: 8 (ref 5–15)
BUN: 13 mg/dL (ref 6–20)
CO2: 28 mmol/L (ref 22–32)
Calcium: 8.2 mg/dL — ABNORMAL LOW (ref 8.9–10.3)
Chloride: 98 mmol/L (ref 98–111)
Creatinine, Ser: 1.12 mg/dL — ABNORMAL HIGH (ref 0.44–1.00)
GFR, Estimated: 58 mL/min — ABNORMAL LOW (ref >60)
Glucose, Bld: 163 mg/dL — ABNORMAL HIGH (ref 70–99)
Potassium: 4.5 mmol/L (ref 3.5–5.1)
Sodium: 134 mmol/L — ABNORMAL LOW (ref 135–145)
Total Bilirubin: 0.5 mg/dL (ref 0.3–1.2)
Total Protein: 5.9 g/dL — ABNORMAL LOW (ref 6.5–8.1)

## 2021-10-17 LAB — CBC
HCT: 22 % — ABNORMAL LOW (ref 36.0–46.0)
Hemoglobin: 7.5 g/dL — ABNORMAL LOW (ref 12.0–15.0)
MCH: 32.3 pg (ref 26.0–34.0)
MCHC: 34.1 g/dL (ref 30.0–36.0)
MCV: 94.8 fL (ref 80.0–100.0)
Platelets: 767 10*3/uL — ABNORMAL HIGH (ref 150–400)
RBC: 2.32 MIL/uL — ABNORMAL LOW (ref 3.87–5.11)
RDW: 14.8 % (ref 11.5–15.5)
WBC: 18.9 10*3/uL — ABNORMAL HIGH (ref 4.0–10.5)
nRBC: 0 % (ref 0.0–0.2)

## 2021-10-17 LAB — GLUCOSE, CAPILLARY
Glucose-Capillary: 120 mg/dL — ABNORMAL HIGH (ref 70–99)
Glucose-Capillary: 167 mg/dL — ABNORMAL HIGH (ref 70–99)
Glucose-Capillary: 130 mg/dL — ABNORMAL HIGH (ref 70–99)
Glucose-Capillary: 264 mg/dL — ABNORMAL HIGH (ref 70–99)

## 2021-10-17 NOTE — Progress Notes (Signed)
Progress Note   Patient: [name] ZOX:[id number] DOB: 11/28/1965 DOA: 10/04/2021     12 DOS: the patient was seen and examined on 10/17/2021   Brief hospital course: 56 y.o. female with a history of with inferior STEMI in 2015, insulin-dependent diabetes mellitus, hypertension, and NASH. Patient presented secondary to nausea, vomiting, generalized aches and was found to be in septic shock from right emphysematous pyelonephritis. Patient empirically treated with Ceftriaxone and admitted to the ICU for vasopressor support after initial resuscitation with IV fluids failed. Urine and blood cultures were positive for E. Coli. Antibiotics transitioned to Cefazolin prior to transitioning back to Ceftriaxone. Perinephric aspiration performed on 6/26 by IR.  Assessment and Plan: Septic shock Present on admission. Secondary to pyelonephritis and E. Coli bacteremia. Patient required ICU on admission and norepinephrine for vasopressor support in addition to midodrine. Continued on abx per below.   Right emphysematous pyelonephritis E. Coli bacteremia CT imaging on admission (6/18) was significant for gas collections within the right kidney concerning for emphysematous pyelonephritis. Blood and urine cultures positive for E. Coli bacteria. ID consulted. Patient transitioned to Ancef and is back on Ceftriaxone. WBC trended up but seems to have possibly reached a plateau. Repeat CT abdomen/pelvis with some evidence of disease improvement. IR consulted and performed CT guided right perinephric aspiration on 6/26 yielding 7 mL of purulent fluid; gram stain with GNRs. -ID recommendations: cont rocephin IV -ID recs to plan OPAT for 3 weeks of rocephin starting 7/3 if she continues to be stable or improving -Per urology, consider repeat CT Sunday   Acute respiratory failure with hypoxia Secondary to pulmonary edema. Patient initially requiring BiPAP for management. Weaned to room air. -Remains on minimal O2  support   Acute pulmonary edema Secondary to acute heart failure.   Acute on chronic systolic heart failure LVEF 40-45% on 6/19 which is slightly worse from previous baseline. Patient is on lisinopril as an outpatient. Acute failure likely secondary fluid resuscitation.  -Improved with doses of lasix -Cont to follow daily weights and strict in/out   AKI Secondary to ATN from septic shock and resultant hypotension. Peak creatinine of 3.07 which has now trended back down to baseline. -Cr down to 1.12   High anion gap metabolic acidosis Likely secondary to AKI. Resolved with resolution of AKI.   Hypokalemia Treated with potassium supplementation. Resolved. -cont to follow bmet trends   Thrombocytopenia Unknown baseline. Possibly secondary to acute illness if acute vs NASH cirrhosis if chronic. Patient has received 3 units of platelets to date.  -Resolved   Hypoglycemia In setting of sepsis. Resolved.   Leukocytosis Peak of 20,700. Trending down. In setting of sepsis/pyelonephritis/abscesses WBC remains stable at 18.9   NASH Cirrhosis Upper abdominal varices Patient with along history of NASH, now with evidence of cirrhosis on CT imaging. Patient with a remote drinking history but not significant per report. Recent weight loss. Patient will need GI follow-up.   Diabetes mellitus, type 2 Diabetic neuropathy Patient is on insulin glargine 30 units qHS and metformin as an outpatient. She is on gabapentin 1,200 mg qHS for neuropathy. Hemoglobin A1C of 12.5%.  -On Novolog 10 units TID  -Increase semglee to 20 units BID per Diabetic Coordinator recs -Gabapentin 300 mg TID -Cont SSI as needed   Hypocalcemia Secondary to hypoalbuminemia. Corrected calcium is wnl.   Constipation Resolved with dulcolax suppository  L shoulder pain -Reports L shoulder pain after bumping into it during transfer to bed while getting CT earlier -Ordered and  reviewed L shoulder xray, negative        Subjective: Complaining of wanting different menu items  Physical Exam: Vitals:   10/16/21 2130 10/17/21 0531 10/17/21 0531 10/17/21 0938  BP: 102/68  90/61 112/63  Pulse: 81  80 81  Resp: '18  18 17  '$ Temp: 99.4 F (37.4 C)  99 F (37.2 C) 98.2 F (36.8 C)  TempSrc: Oral  Oral   SpO2: 99%  97% 94%  Weight:  73.2 kg    Height:       General exam: Awake, laying in bed, in nad Respiratory system: Normal respiratory effort, no wheezing Cardiovascular system: regular rate, s1, s2 Gastrointestinal system: Soft, nondistended, positive BS Central nervous system: CN2-12 grossly intact, strength intact Extremities: Perfused, no clubbing Skin: Normal skin turgor, no notable skin lesions seen Psychiatry: Mood normal // no visual hallucinations   Data Reviewed:  Labs reviewed: Na 134, K 4.5, Cr 1.12  Family Communication: Pt in room, family not at bedside  Disposition: Status is: Inpatient Remains inpatient appropriate because: Severity of illness  Planned Discharge Destination: Home     Author: Marylu Lund, MD 10/17/2021 4:45 PM  For on call review [email or web address].

## 2021-10-17 NOTE — Plan of Care (Signed)
  Problem: Fluid Volume: Goal: Ability to maintain a balanced intake and output will improve Outcome: Not Progressing

## 2021-10-17 NOTE — Progress Notes (Signed)
PT Cancellation Note  Patient Details Name: [name] MRN: [id number] DOB: [birth year]   Cancelled Treatment:    Reason Eval/Treat Not Completed: (P) Other (comment) (pt in shower) PT will follow back as able this afternoon.  Jorell Agne B. Migdalia Dk PT, DPT Acute Rehabilitation Services Please use secure chat or  Call Office [phone]   [place] 10/17/2021, 1:09 PM

## 2021-10-17 NOTE — Progress Notes (Signed)
Physical Therapy Treatment Patient Details Name: [name] MRN: [id number] DOB: 1965-10-22 Today's Date: 10/17/2021   History of Present Illness Pt is a 56 y.o. female admitted 10/04/21 with c/o SOB, chest pain, N/V/D, loss of appetite. 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 S/p R perinephric aspiration 6/26. PMH includes CAD, DM, fibromyalgia, neuropathy, ischemic cardiomyopathy, sinus bradycardia, NASH.    PT Comments    Pt asleep on entry, wakes easily and is agreeable to ambulation. Pt reports feeling much better after her shower and nap, however once up and moving around she has complaints of 9/10 headache pain. Pt reports it is probably related to her not being able to eat the automatically ordered meals as she needs softer food. Pt supervision for bed mobility, transfers and ambulation today. At end of session assisted pt in getting placed on room service menu so that she can get more appropriate food. D/c plan remains appropriate at this time. PT will continue to follow acutely.    Recommendations for follow up therapy are one component of a multi-disciplinary discharge planning process, led by the attending physician.  Recommendations may be updated based on patient status, additional functional criteria and insurance authorization.  Follow Up Recommendations  Home health PT     Assistance Recommended at Discharge Intermittent Supervision/Assistance  Patient can return home with the following A little help with walking and/or transfers;A little help with bathing/dressing/bathroom;Assistance with cooking/housework;Assist for transportation;Help with stairs or ramp for entrance   Equipment Recommendations  Rolling walker (2 wheels)    Recommendations for Other Services OT consult     Precautions / Restrictions Precautions Precautions: Fall;Other (comment) Precaution Comments:  hypotension Restrictions Weight Bearing Restrictions: No     Mobility  Bed Mobility Overal bed mobility: Needs Assistance Bed Mobility: Sit to Supine     Supine to sit: Supervision     General bed mobility comments: supervision for safety    Transfers Overall transfer level: Needs assistance Equipment used: Rolling walker (2 wheels) Transfers: Sit to/from Stand Sit to Stand: Supervision           General transfer comment: No assist needed, increased time due to pain in abdomen    Ambulation/Gait Ambulation/Gait assistance: Supervision Gait Distance (Feet): 1000 Feet Assistive device: Rolling walker (2 wheels) Gait Pattern/deviations: Step-through pattern, Decreased stride length, Trunk flexed Gait velocity: Decreased Gait velocity interpretation: 1.31 - 2.62 ft/sec, indicative of limited community ambulator   General Gait Details: slow, mildly antalgic gait       Balance Overall balance assessment: Needs assistance Sitting-balance support: No upper extremity supported, Feet unsupported Sitting balance-Leahy Scale: Good Sitting balance - Comments: donned socks seated on EOB with sock aide   Standing balance support: During functional activity, Reliant on assistive device for balance Standing balance-Leahy Scale: Fair Standing balance comment: able to static stand without UE support               High Level Balance Comments: worked on single LE stance first on L and then on R pt with increased pain on R single leg stance            Cognition Arousal/Alertness: Awake/alert Behavior During Therapy: Flat affect Overall Cognitive Status: Within Functional Limits for tasks assessed                                 General Comments: very  upset about not being able to order food she can eat given her gum problems           General Comments General comments (skin integrity, edema, etc.): VSS on RA      Pertinent Vitals/Pain Pain  Assessment Pain Assessment: 0-10 Pain Score: 9  Pain Location: headache and back Pain Descriptors / Indicators: Sore, Tiring, Headache Pain Intervention(s): Limited activity within patient's tolerance, Monitored during session, Repositioned, Patient requesting pain meds-RN notified     PT Goals (current goals can now be found in the care plan section) Acute Rehab PT Goals Patient Stated Goal: return home PT Goal Formulation: With patient/family Time For Goal Achievement: 10/23/21 Potential to Achieve Goals: Good Progress towards PT goals: Progressing toward goals    Frequency    Min 3X/week      PT Plan Current plan remains appropriate       AM-PAC PT "6 Clicks" Mobility   Outcome Measure  Help needed turning from your back to your side while in a flat bed without using bedrails?: None Help needed moving from lying on your back to sitting on the side of a flat bed without using bedrails?: A Little Help needed moving to and from a bed to a chair (including a wheelchair)?: A Little Help needed standing up from a chair using your arms (e.g., wheelchair or bedside chair)?: A Little Help needed to walk in hospital room?: A Little Help needed climbing 3-5 steps with a railing? : A Little 6 Click Score: 19    End of Session Equipment Utilized During Treatment: Gait belt Activity Tolerance: Patient tolerated treatment well;Patient limited by fatigue Patient left: with call bell/phone within reach;in chair Nurse Communication: Mobility status PT Visit Diagnosis: Other abnormalities of gait and mobility (R26.89);Muscle weakness (generalized) (M62.81);Pain Pain - part of body:  (back headache)     Time: 9924-2683 PT Time Calculation (min) (ACUTE ONLY): 34 min  Charges:  $Therapeutic Exercise: 23-37 mins                     Jerett Odonohue B. Migdalia Dk PT, DPT Acute Rehabilitation Services Please use secure chat or  Call Office [phone]    [place] 10/17/2021, 4:16 PM

## 2021-10-18 DIAGNOSIS — J9601 Acute respiratory failure with hypoxia: Secondary | ICD-10-CM | POA: Diagnosis not present

## 2021-10-18 DIAGNOSIS — N12 Tubulo-interstitial nephritis, not specified as acute or chronic: Secondary | ICD-10-CM | POA: Diagnosis not present

## 2021-10-18 LAB — COMPREHENSIVE METABOLIC PANEL
ALT: 39 U/L (ref 0–44)
AST: 33 U/L (ref 15–41)
Albumin: 1.8 g/dL — ABNORMAL LOW (ref 3.5–5.0)
Alkaline Phosphatase: 127 U/L — ABNORMAL HIGH (ref 38–126)
Anion gap: 7 (ref 5–15)
BUN: 13 mg/dL (ref 6–20)
CO2: 28 mmol/L (ref 22–32)
Calcium: 8.5 mg/dL — ABNORMAL LOW (ref 8.9–10.3)
Chloride: 100 mmol/L (ref 98–111)
Creatinine, Ser: 1.19 mg/dL — ABNORMAL HIGH (ref 0.44–1.00)
GFR, Estimated: 54 mL/min — ABNORMAL LOW (ref >60)
Glucose, Bld: 151 mg/dL — ABNORMAL HIGH (ref 70–99)
Potassium: 4.8 mmol/L (ref 3.5–5.1)
Sodium: 135 mmol/L (ref 135–145)
Total Bilirubin: 0.6 mg/dL (ref 0.3–1.2)
Total Protein: 6.3 g/dL — ABNORMAL LOW (ref 6.5–8.1)

## 2021-10-18 LAB — CBC
HCT: 22.5 % — ABNORMAL LOW (ref 36.0–46.0)
Hemoglobin: 7.7 g/dL — ABNORMAL LOW (ref 12.0–15.0)
MCH: 32.6 pg (ref 26.0–34.0)
MCHC: 34.2 g/dL (ref 30.0–36.0)
MCV: 95.3 fL (ref 80.0–100.0)
Platelets: 794 10*3/uL — ABNORMAL HIGH (ref 150–400)
RBC: 2.36 MIL/uL — ABNORMAL LOW (ref 3.87–5.11)
RDW: 14.9 % (ref 11.5–15.5)
WBC: 17.8 10*3/uL — ABNORMAL HIGH (ref 4.0–10.5)
nRBC: 0 % (ref 0.0–0.2)

## 2021-10-18 LAB — GLUCOSE, CAPILLARY
Glucose-Capillary: 101 mg/dL — ABNORMAL HIGH (ref 70–99)
Glucose-Capillary: 193 mg/dL — ABNORMAL HIGH (ref 70–99)
Glucose-Capillary: 144 mg/dL — ABNORMAL HIGH (ref 70–99)
Glucose-Capillary: 83 mg/dL (ref 70–99)

## 2021-10-18 LAB — AEROBIC/ANAEROBIC CULTURE W GRAM STAIN (SURGICAL/DEEP WOUND)

## 2021-10-18 NOTE — Progress Notes (Signed)
Occupational Therapy Treatment Patient Details Name: [name] MRN: [id number] DOB: [birth year] Today's Date: 10/18/2021   History of present illness Pt is a 56 y.o. female admitted 10/04/21 with c/o SOB, chest pain, N/V/D, loss of appetite. 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 S/p R perinephric aspiration 6/26. PMH includes CAD, DM, fibromyalgia, neuropathy, ischemic cardiomyopathy, sinus bradycardia, NASH.   OT comments  Pt making good and consistent progress with functional goals. Pt with no c/o dizziness this session but c/o back and headache pain, however did not limit her function this session. OT will continue to follow acutely to maximize level of function and safety to return home   Recommendations for follow up therapy are one component of a multi-disciplinary discharge planning process, led by the attending physician.  Recommendations may be updated based on patient status, additional functional criteria and insurance authorization.    Follow Up Recommendations  Home health OT    Assistance Recommended at Discharge Intermittent Supervision/Assistance  Patient can return home with the following  A little help with bathing/dressing/bathroom;Assist for transportation;Assistance with cooking/housework   Equipment Recommendations  BSC/3in1    Recommendations for Other Services      Precautions / Restrictions Precautions Precautions: Fall Restrictions Weight Bearing Restrictions: No       Mobility Bed Mobility               General bed mobility comments: pt seated in recliner upon arrival    Transfers Overall transfer level: Needs assistance Equipment used: Rolling walker (2 wheels), 1 person hand held assist Transfers: Sit to/from Stand Sit to Stand: Supervision           General transfer comment: initiated walking to bathroom with RW, progressing to  HHA     Balance Overall balance assessment: Needs assistance   Sitting balance-Leahy Scale: Good     Standing balance support: During functional activity, Reliant on assistive device for balance Standing balance-Leahy Scale: Fair                             ADL either performed or assessed with clinical judgement   ADL Overall ADL's : Needs assistance/impaired     Grooming: Wash/dry hands;Wash/dry face;Oral care;Supervision/safety;Standing           Upper Body Dressing : Min guard;Standing       Toilet Transfer: Supervision/safety;Ambulation;BSC/3in1;Grab bars   Toileting- Clothing Manipulation and Hygiene: Supervision/safety;Sit to/from stand       Functional mobility during ADLs: Min guard;Supervision/safety;Rolling walker (2 wheels)      Extremity/Trunk Assessment Upper Extremity Assessment Upper Extremity Assessment: Overall WFL for tasks assessed   Lower Extremity Assessment Lower Extremity Assessment: Defer to PT evaluation   Cervical / Trunk Assessment Cervical / Trunk Assessment: Normal    Vision Baseline Vision/History: 1 Wears glasses Ability to See in Adequate Light: 0 Adequate Patient Visual Report: No change from baseline     Perception     Praxis      Cognition Arousal/Alertness: Awake/alert Behavior During Therapy: WFL for tasks assessed/performed Overall Cognitive Status: Within Functional Limits for tasks assessed                                          Exercises      Shoulder Instructions  General Comments      Pertinent Vitals/ Pain       Pain Assessment Pain Assessment: Faces Faces Pain Scale: Hurts little more Pain Location: headache and back Pain Descriptors / Indicators: Sore, Headache, Discomfort Pain Intervention(s): Monitored during session, Repositioned  Home Living                                          Prior Functioning/Environment               Frequency  Min 2X/week        Progress Toward Goals  OT Goals(current goals can now be found in the care plan section)  Progress towards OT goals: Progressing toward goals     Plan Discharge plan remains appropriate;Frequency remains appropriate    Co-evaluation                 AM-PAC OT "6 Clicks" Daily Activity     Outcome Measure   Help from another person eating meals?: None Help from another person taking care of personal grooming?: A Little Help from another person toileting, which includes using toliet, bedpan, or urinal?: A Little Help from another person bathing (including washing, rinsing, drying)?: A Little Help from another person to put on and taking off regular upper body clothing?: A Little Help from another person to put on and taking off regular lower body clothing?: A Lot 6 Click Score: 18    End of Session Equipment Utilized During Treatment: Rolling walker (2 wheels);Gait belt  OT Visit Diagnosis: Muscle weakness (generalized) (M62.81)   Activity Tolerance Patient tolerated treatment well   Patient Left in chair;with call bell/phone within reach   Nurse Communication          Time: 7124-5809 OT Time Calculation (min): 24 min  Charges: OT General Charges $OT Visit: 1 Visit OT Treatments $Self Care/Home Management : 8-22 mins $Therapeutic Activity: 8-22 mins    Britt Bottom 10/18/2021, 12:32 PM

## 2021-10-18 NOTE — Progress Notes (Signed)
Mobility Specialist Criteria Algorithm Info.   10/18/21 1030  Mobility  Activity Ambulated with assistance in hallway (in chair before and after ambulation)  Range of Motion/Exercises Active;All extremities  Level of Assistance Standby assist, set-up cues, supervision of patient - no hands on  Assistive Device Front wheel walker  Distance Ambulated (ft) 2500 ft (+)  Activity Response Tolerated well   Patient received in recliner chair eager to participate in mobility. Ambulated supervision with steady gait. Required seated rest break x1 second to lower back pain and fatigue. Returned to room without incident. Was left in recliner chair with all needs met, call bell in reach.   10/18/2021 12:16 PM  [name], [place], [place]  COBTV:[phone] Office: [phone]

## 2021-10-18 NOTE — Plan of Care (Signed)
  Problem: Coping: Goal: Ability to adjust to condition or change in health will improve Outcome: Progressing   Problem: Fluid Volume: Goal: Ability to maintain a balanced intake and output will improve Outcome: Progressing   Problem: Health Behavior/Discharge Planning: Goal: Ability to identify and utilize available resources and services will improve Outcome: Progressing   Problem: Health Behavior/Discharge Planning: Goal: Ability to manage health-related needs will improve Outcome: Progressing   

## 2021-10-18 NOTE — Progress Notes (Signed)
Progress Note   Patient: [name] [id number]:[id number] DOB: July 29, 1965 DOA: 10/04/2021     13 DOS: the patient was seen and examined on 10/18/2021   Brief hospital course: 56 y.o. female with a history of with inferior STEMI in 2015, insulin-dependent diabetes mellitus, hypertension, and NASH. Patient presented secondary to nausea, vomiting, generalized aches and was found to be in septic shock from right emphysematous pyelonephritis. Patient empirically treated with Ceftriaxone and admitted to the ICU for vasopressor support after initial resuscitation with IV fluids failed. Urine and blood cultures were positive for E. Coli. Antibiotics transitioned to Cefazolin prior to transitioning back to Ceftriaxone. Perinephric aspiration performed on 6/26 by IR.  Assessment and Plan: Septic shock Present on admission. Secondary to pyelonephritis and E. Coli bacteremia. Patient required ICU on admission and norepinephrine for vasopressor support in addition to midodrine. Continue below abx   Right emphysematous pyelonephritis E. Coli bacteremia CT imaging on admission (6/18) was significant for gas collections within the right kidney concerning for emphysematous pyelonephritis. Blood and urine cultures positive for E. Coli bacteria. ID consulted. Patient transitioned to Ancef and is back on Ceftriaxone. WBC trended up but seems to have possibly reached a plateau. Repeat CT abdomen/pelvis with some evidence of disease improvement. IR consulted and performed CT guided right perinephric aspiration on 6/26 yielding 7 mL of purulent fluid; gram stain with GNRs. -ID recommendations: cont rocephin IV -ID recs to plan OPAT for 3 weeks of rocephin starting 7/3 if she continues to be stable or improving -Per urology, consider repeat CT Sunday  -Still reporting notable abd pain this AM   Acute respiratory failure with hypoxia Secondary to pulmonary edema. Patient initially requiring BiPAP for management. Weaned to  room air. -Remains on minimal O2 support   Acute pulmonary edema Secondary to acute heart failure.   Acute on chronic systolic heart failure LVEF 40-45% on 6/19 which is slightly worse from previous baseline. Patient is on lisinopril as an outpatient. Acute failure likely secondary fluid resuscitation.  -Improved with doses of lasix -Cont to follow daily weights and strict in/out   AKI Secondary to ATN from septic shock and resultant hypotension. Peak creatinine of 3.07 which has now trended back down to baseline. -Cr stable at 1.19 -Continued IVF in setting of poor PO intake   High anion gap metabolic acidosis Likely secondary to AKI. Resolved with resolution of AKI.   Hypokalemia Treated with potassium supplementation. Resolved. -cont to follow bmet trends   Thrombocytopenia Unknown baseline. Possibly secondary to acute illness if acute vs NASH cirrhosis if chronic. Patient has received 3 units of platelets to date.  -Resolved   Hypoglycemia In setting of sepsis. Resolved.   Leukocytosis Peak of 20,700. Trending down. In setting of sepsis/pyelonephritis/abscesses WBC remains stable at 18.9   NASH Cirrhosis Upper abdominal varices Patient with along history of NASH, now with evidence of cirrhosis on CT imaging. Patient with a remote drinking history but not significant per report. Recent weight loss. Patient will need GI follow-up.   Diabetes mellitus, type 2 Diabetic neuropathy Patient is on insulin glargine 30 units qHS and metformin as an outpatient. She is on gabapentin 1,200 mg qHS for neuropathy. Hemoglobin A1C of 12.5%.  -On Novolog 10 units TID  -Increase semglee to 20 units BID per Diabetic Coordinator recs -Gabapentin 300 mg TID -Cont SSI as needed   Hypocalcemia Secondary to hypoalbuminemia. Corrected calcium is wnl.   Constipation Resolved with dulcolax suppository  L shoulder pain -Reports L shoulder pain  after bumping into it during transfer to bed  while getting CT earlier -Recently ordered and reviewed L shoulder xray, negative       Subjective: Complaining of continued abd pain. Also still complaining about limited diet choices  Physical Exam: Vitals:   10/17/21 1629 10/17/21 2056 10/18/21 0504 10/18/21 0934  BP: 112/61 (!) 106/55 (!) 131/59 (!) 87/47  Pulse: 75 66 88 88  Resp:  '18 18 18  '$ Temp: 98.1 F (36.7 C) 98.7 F (37.1 C) 99.6 F (37.6 C) 98.8 F (37.1 C)  TempSrc:  Oral Oral Oral  SpO2: 98% 96% 90% 93%  Weight:      Height:       General exam: Conversant, in no acute distress Respiratory system: normal chest rise, clear, no audible wheezing Cardiovascular system: regular rhythm, s1-s2 Gastrointestinal system: Nondistended, nontender, pos BS Central nervous system: No seizures, no tremors Extremities: No cyanosis, no joint deformities Skin: No rashes, no pallor Psychiatry: Affect normal // no auditory hallucinations   Data Reviewed:  Labs reviewed: Na 135, K 4.8, Cr 1.19  Family Communication: Pt in room, family not at bedside  Disposition: Status is: Inpatient Remains inpatient appropriate because: Severity of illness  Planned Discharge Destination: Home     Author: Marylu Lund, MD 10/18/2021 1:50 PM  For on call review [email or web address].

## 2021-10-19 DIAGNOSIS — E872 Acidosis, unspecified: Secondary | ICD-10-CM

## 2021-10-19 DIAGNOSIS — N179 Acute kidney failure, unspecified: Secondary | ICD-10-CM | POA: Diagnosis not present

## 2021-10-19 DIAGNOSIS — N12 Tubulo-interstitial nephritis, not specified as acute or chronic: Secondary | ICD-10-CM | POA: Diagnosis not present

## 2021-10-19 LAB — COMPREHENSIVE METABOLIC PANEL
ALT: 47 U/L — ABNORMAL HIGH (ref 0–44)
AST: 54 U/L — ABNORMAL HIGH (ref 15–41)
Albumin: 2 g/dL — ABNORMAL LOW (ref 3.5–5.0)
Alkaline Phosphatase: 150 U/L — ABNORMAL HIGH (ref 38–126)
Anion gap: 11 (ref 5–15)
BUN: 12 mg/dL (ref 6–20)
CO2: 27 mmol/L (ref 22–32)
Calcium: 8.6 mg/dL — ABNORMAL LOW (ref 8.9–10.3)
Chloride: 96 mmol/L — ABNORMAL LOW (ref 98–111)
Creatinine, Ser: 1.12 mg/dL — ABNORMAL HIGH (ref 0.44–1.00)
GFR, Estimated: 58 mL/min — ABNORMAL LOW (ref >60)
Glucose, Bld: 96 mg/dL (ref 70–99)
Potassium: 4.5 mmol/L (ref 3.5–5.1)
Sodium: 134 mmol/L — ABNORMAL LOW (ref 135–145)
Total Bilirubin: 0.3 mg/dL (ref 0.3–1.2)
Total Protein: 6.8 g/dL (ref 6.5–8.1)

## 2021-10-19 LAB — CBC
HCT: 24 % — ABNORMAL LOW (ref 36.0–46.0)
Hemoglobin: 8.1 g/dL — ABNORMAL LOW (ref 12.0–15.0)
MCH: 32.4 pg (ref 26.0–34.0)
MCHC: 33.8 g/dL (ref 30.0–36.0)
MCV: 96 fL (ref 80.0–100.0)
Platelets: 827 10*3/uL — ABNORMAL HIGH (ref 150–400)
RBC: 2.5 MIL/uL — ABNORMAL LOW (ref 3.87–5.11)
RDW: 14.9 % (ref 11.5–15.5)
WBC: 14.6 10*3/uL — ABNORMAL HIGH (ref 4.0–10.5)
nRBC: 0 % (ref 0.0–0.2)

## 2021-10-19 LAB — GLUCOSE, CAPILLARY
Glucose-Capillary: 91 mg/dL (ref 70–99)
Glucose-Capillary: 165 mg/dL — ABNORMAL HIGH (ref 70–99)
Glucose-Capillary: 128 mg/dL — ABNORMAL HIGH (ref 70–99)
Glucose-Capillary: 115 mg/dL — ABNORMAL HIGH (ref 70–99)

## 2021-10-19 MED ORDER — SIMVASTATIN 20 MG PO TABS
40.0000 mg | ORAL_TABLET | Freq: Every day | ORAL | Status: DC
  Administered 2021-10-19 – 2021-10-27 (×9): 40 mg via ORAL
  Filled 2021-10-19 (×9): qty 2

## 2021-10-19 NOTE — Progress Notes (Signed)
Progress Note   Patient: [name] [id number]:[id number] DOB: Dec 19, 1965 DOA: 10/04/2021     14 DOS: the patient was seen and examined on 10/19/2021   Brief hospital course: 56 y.o. female with a history of with inferior STEMI in 2015, insulin-dependent diabetes mellitus, hypertension, and NASH. Patient presented secondary to nausea, vomiting, generalized aches and was found to be in septic shock from right emphysematous pyelonephritis. Patient empirically treated with Ceftriaxone and admitted to the ICU for vasopressor support after initial resuscitation with IV fluids failed. Urine and blood cultures were positive for E. Coli. Antibiotics transitioned to Cefazolin prior to transitioning back to Ceftriaxone. Perinephric aspiration performed on 6/26 by IR.  Assessment and Plan: Septic shock Present on admission. Secondary to pyelonephritis and E. Coli bacteremia. Patient required ICU on admission and norepinephrine for vasopressor support in addition to midodrine. Continue below abx   Right emphysematous pyelonephritis E. Coli bacteremia CT imaging on admission (6/18) was significant for gas collections within the right kidney concerning for emphysematous pyelonephritis. Blood and urine cultures positive for E. Coli bacteria. ID consulted. Patient transitioned to Ancef and is back on Ceftriaxone. WBC trended up but seems to have possibly reached a plateau. Repeat CT abdomen/pelvis with some evidence of disease improvement. IR consulted and performed CT guided right perinephric aspiration on 6/26 yielding 7 mL of purulent fluid; gram stain with GNRs. -ID recommendations: cont rocephin IV -ID recs to plan OPAT for 3 weeks of rocephin starting 7/3 if she continues to be stable or improving -Per urology, consideration for repeat imaging -continues with abd discomfort   Acute respiratory failure with hypoxia Secondary to pulmonary edema. Patient initially requiring BiPAP for management. Weaned to room  air. -Remains on minimal O2 support   Acute pulmonary edema Secondary to acute heart failure.   Acute on chronic systolic heart failure LVEF 40-45% on 6/19 which is slightly worse from previous baseline. Patient is on lisinopril as an outpatient. Acute failure likely secondary fluid resuscitation.  -Improved with doses of lasix -Cont to follow daily weights and strict in/out   AKI Secondary to ATN from septic shock and resultant hypotension. Peak creatinine of 3.07 which has now trended back down to baseline. -Cr stable at 1.12 -Continued IVF in setting of poor PO intake   High anion gap metabolic acidosis Likely secondary to AKI. Resolved with resolution of AKI.   Hypokalemia Treated with potassium supplementation. Resolved. -cont to follow bmet trends   Thrombocytopenia Unknown baseline. Possibly secondary to acute illness if acute vs NASH cirrhosis if chronic. Patient has received 3 units of platelets to date.  -Resolved   Hypoglycemia In setting of sepsis. Resolved.   Leukocytosis Peak of 20,700. Trending down. In setting of sepsis/pyelonephritis/abscesses WBC remains stable at 18.9   NASH Cirrhosis Upper abdominal varices Patient with along history of NASH, now with evidence of cirrhosis on CT imaging. Patient with a remote drinking history but not significant per report. Recent weight loss. Patient will need GI follow-up.   Diabetes mellitus, type 2 Diabetic neuropathy Patient is on insulin glargine 30 units qHS and metformin as an outpatient. She is on gabapentin 1,200 mg qHS for neuropathy. Hemoglobin A1C of 12.5%.  -On Novolog 10 units TID  -Increase semglee to 20 units BID per Diabetic Coordinator recs -Gabapentin 300 mg TID -Cont SSI as needed   Hypocalcemia Secondary to hypoalbuminemia. Corrected calcium is wnl.   Constipation Resolved with dulcolax suppository  L shoulder pain -Reports L shoulder pain after bumping into it  during transfer to bed while  getting CT earlier -Recently ordered and reviewed L shoulder xray, negative       Subjective: Feeling better this AM  Physical Exam: Vitals:   10/18/21 1639 10/18/21 2239 10/19/21 0446 10/19/21 1003  BP: 137/69 (!) 96/52 107/61 (!) 110/56  Pulse: 77 75 67 83  Resp: '18  18 18  '$ Temp: 98.9 F (37.2 C) 99.1 F (37.3 C) 97.9 F (36.6 C) 98.2 F (36.8 C)  TempSrc: Oral Oral Oral Oral  SpO2: 95%  98% 95%  Weight:   74 kg   Height:       General exam: Awake, laying in bed, in nad Respiratory system: Normal respiratory effort, no wheezing Cardiovascular system: regular rate, s1, s2 Gastrointestinal system: Soft, nondistended, positive BS Central nervous system: CN2-12 grossly intact, strength intact Extremities: Perfused, no clubbing Skin: Normal skin turgor, no notable skin lesions seen Psychiatry: Mood normal // no visual hallucinations   Data Reviewed:  Labs reviewed: Na 134, K 4.5, Cr 1.12  Family Communication: Pt in room, family not at bedside  Disposition: Status is: Inpatient Remains inpatient appropriate because: Severity of illness  Planned Discharge Destination: Home     Author: Marylu Lund, MD 10/19/2021 4:39 PM  For on call review [email or web address].

## 2021-10-20 DIAGNOSIS — J9601 Acute respiratory failure with hypoxia: Secondary | ICD-10-CM | POA: Diagnosis not present

## 2021-10-20 DIAGNOSIS — D72829 Elevated white blood cell count, unspecified: Secondary | ICD-10-CM | POA: Diagnosis not present

## 2021-10-20 DIAGNOSIS — N179 Acute kidney failure, unspecified: Secondary | ICD-10-CM | POA: Diagnosis not present

## 2021-10-20 DIAGNOSIS — B9620 Unspecified Escherichia coli [E. coli] as the cause of diseases classified elsewhere: Secondary | ICD-10-CM | POA: Diagnosis not present

## 2021-10-20 DIAGNOSIS — R7881 Bacteremia: Secondary | ICD-10-CM | POA: Diagnosis not present

## 2021-10-20 DIAGNOSIS — N12 Tubulo-interstitial nephritis, not specified as acute or chronic: Secondary | ICD-10-CM | POA: Diagnosis not present

## 2021-10-20 LAB — GLUCOSE, CAPILLARY
Glucose-Capillary: 128 mg/dL — ABNORMAL HIGH (ref 70–99)
Glucose-Capillary: 94 mg/dL (ref 70–99)
Glucose-Capillary: 209 mg/dL — ABNORMAL HIGH (ref 70–99)
Glucose-Capillary: 161 mg/dL — ABNORMAL HIGH (ref 70–99)

## 2021-10-20 LAB — COMPREHENSIVE METABOLIC PANEL
ALT: 52 U/L — ABNORMAL HIGH (ref 0–44)
AST: 61 U/L — ABNORMAL HIGH (ref 15–41)
Albumin: 1.7 g/dL — ABNORMAL LOW (ref 3.5–5.0)
Alkaline Phosphatase: 151 U/L — ABNORMAL HIGH (ref 38–126)
Anion gap: 7 (ref 5–15)
BUN: 13 mg/dL (ref 6–20)
CO2: 28 mmol/L (ref 22–32)
Calcium: 8.2 mg/dL — ABNORMAL LOW (ref 8.9–10.3)
Chloride: 99 mmol/L (ref 98–111)
Creatinine, Ser: 1.18 mg/dL — ABNORMAL HIGH (ref 0.44–1.00)
GFR, Estimated: 54 mL/min — ABNORMAL LOW (ref >60)
Glucose, Bld: 158 mg/dL — ABNORMAL HIGH (ref 70–99)
Potassium: 4.4 mmol/L (ref 3.5–5.1)
Sodium: 134 mmol/L — ABNORMAL LOW (ref 135–145)
Total Bilirubin: 0.4 mg/dL (ref 0.3–1.2)
Total Protein: 6.1 g/dL — ABNORMAL LOW (ref 6.5–8.1)

## 2021-10-20 LAB — CBC
HCT: 23 % — ABNORMAL LOW (ref 36.0–46.0)
Hemoglobin: 7.7 g/dL — ABNORMAL LOW (ref 12.0–15.0)
MCH: 32.2 pg (ref 26.0–34.0)
MCHC: 33.5 g/dL (ref 30.0–36.0)
MCV: 96.2 fL (ref 80.0–100.0)
Platelets: 791 10*3/uL — ABNORMAL HIGH (ref 150–400)
RBC: 2.39 MIL/uL — ABNORMAL LOW (ref 3.87–5.11)
RDW: 14.9 % (ref 11.5–15.5)
WBC: 13.1 10*3/uL — ABNORMAL HIGH (ref 4.0–10.5)
nRBC: 0 % (ref 0.0–0.2)

## 2021-10-20 MED ORDER — ALBUMIN HUMAN 25 % IV SOLN
25.0000 g | Freq: Once | INTRAVENOUS | Status: AC
  Administered 2021-10-20: 25 g via INTRAVENOUS
  Filled 2021-10-20: qty 100

## 2021-10-20 MED ORDER — SODIUM CHLORIDE 0.9% FLUSH
10.0000 mL | Freq: Two times a day (BID) | INTRAVENOUS | Status: DC
  Administered 2021-10-20 – 2021-10-28 (×14): 10 mL

## 2021-10-20 MED ORDER — SODIUM CHLORIDE 0.9% FLUSH
10.0000 mL | INTRAVENOUS | Status: DC | PRN
  Administered 2021-10-21 – 2021-10-23 (×3): 10 mL

## 2021-10-20 NOTE — TOC Progression Note (Signed)
Transition of Care [facility]) - Progression Note    Patient Details  Name: [name] MRN: [id number] Date of Birth: 1966-01-24  Transition of Care [facility]) CM/SW Contact  Tom-Johnson, Renea Ee, RN Phone Number: 10/20/2021, 2:12 PM  Clinical Narrative:     Midline placed today by IV team. Albumin given today.  CM will continue to follow with needs.  Expected Discharge Plan: [place] Barriers to Discharge: Continued Medical Work up  Expected Discharge Plan and Services Expected Discharge Plan: [place]   Discharge Planning Services: CM Consult Post Acute Care Choice: [place] arrangements for the past 2 months: Single Family Home                   DME Agency: NA       HH Arranged: PT, RN [place] Agency: [place] ([facility]) Date HH Agency Contacted: 10/10/21 Time [place]: 1034 Representative spoke with at [place]: Caryl Pina , will need orders   Social Determinants of Health (SDOH) Interventions    Readmission Risk Interventions     No data to display

## 2021-10-20 NOTE — Progress Notes (Addendum)
[place] [facility] for Infectious Disease  Date of Admission:  10/04/2021   Total days of inpatient antibiotics 16  Principal Problem:   Emphysematous pyelitis Active Problems:   Insulin-requiring or dependent type II diabetes mellitus (HCC)   Leukocytosis   CAD (coronary artery disease)   Hypotension   Thrombocytopenia (HCC)   AKI (acute kidney injury) ([place])   Elevated troponin   Acidosis   Cirrhosis (HCC)   Septic shock (HCC)   Acute respiratory failure with hypoxia (HCC)   E coli bacteremia          Assessment: 56 year old female with CAD, diabetes, NASH cirrhosis, C. 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 coli bacteremia due to emphysematous right-sided pyelonephritis this   #E. coli bacteremia 2/2 pyelonephritis #Emphysematous right-sided pyelonephritis  status post IR PERC drain placement. #Persistent Leukocytosis - Unable to leave drain in, only 7 cc of purulent fluid.. Cx + Ecoli - Patient reports abdominal pain has worsened today as compared to last couple of days - Leukocytosis persists -CT on 6/26 showed small perirenal fluid collection with gas.  -Urology following, consider repeat CT Recommendations: -Follow-up CT AP, may need further intervention as right sided pain is worse today -Continue ceftriaxone   Microbiology:   Antibiotics: ceftriaxone Cultures: Blood 6/17 2/2 ecoli Urine 6/17 ecoli Other   SUBJECTIVE: Resting in bed. Report right sided abdominal pain is worse today , radiating to back. Denies N/V.  Interval: Afebrile overnight. Wbc 14.6k Review of Systems: Review of Systems  All other systems reviewed and are negative.    Scheduled Meds:  sodium chloride   Intravenous Once   Chlorhexidine Gluconate Cloth  6 each Topical Daily   DULoxetine  60 mg Oral QHS   gabapentin  300 mg Oral TID   insulin aspart  0-15 Units  Subcutaneous TID WC   insulin aspart  0-5 Units Subcutaneous QHS   insulin aspart  10 Units Subcutaneous TID WC   insulin glargine-yfgn  20 Units Subcutaneous BID   multivitamin with minerals  1 tablet Oral Daily   pantoprazole  40 mg Oral Daily   polyethylene glycol  17 g Oral BID   senna-docusate  1 tablet Oral BID   simvastatin  40 mg Oral QHS   sodium chloride flush  10-40 mL Intracatheter Q12H   sodium chloride flush  3 mL Intravenous Q12H   sorbitol, milk of mag, mineral oil, glycerin (SMOG) enema  960 mL Rectal Once   Continuous Infusions:  sodium chloride Stopped (10/05/21 0517)   cefTRIAXone (ROCEPHIN)  IV 2 g (10/19/21 1807)   PRN Meds:.acetaminophen **OR** acetaminophen, lip balm, ondansetron **OR** ondansetron (ZOFRAN) IV, mouth rinse, sodium chloride flush, traMADol Allergies  Allergen Reactions   Aspartame And Phenylalanine Anaphylaxis, Shortness Of Breath and Swelling    Throat swells  ANY ARTIFICAL SWEETNERS   Toradol [Ketorolac Tromethamine] Other (See Comments)    Face & neck flushed red & pt felt very hot after IV adm.   Sucralose Rash    Pt.s face broke out in rash after drinking Breeza.  Pt. Claims it is from the artificial sweetener.      Codeine Swelling   Dilaudid [Hydromorphone Hcl] Itching   Hydrocodone Itching   Oxycodone Itching    OBJECTIVE: Vitals:   10/19/21 1659 10/19/21 2053 10/20/21 0553 10/20/21 0924  BP: 100/68 98/66 (!) 112/56 (!) 117/59  Pulse: 84  81 80 84  Resp: '18 18 18 17  '$ Temp: 98 F (36.7 C) 99.5 F (37.5 C) 99.1 F (37.3 C) 98 F (36.7 C)  TempSrc:  Oral Oral   SpO2: 96% 96% 95% 92%  Weight:   74.6 kg   Height:       Body mass index is 29.6 kg/m.  Physical Exam Constitutional:      Appearance: Normal appearance.  HENT:     Head: Normocephalic and atraumatic.     Right Ear: Tympanic membrane normal.     Left Ear: Tympanic membrane normal.     Nose: Nose normal.     Mouth/Throat:     Mouth: Mucous membranes are  moist.  Eyes:     Extraocular Movements: Extraocular movements intact.     Conjunctiva/sclera: Conjunctivae normal.     Pupils: Pupils are equal, round, and reactive to light.  Cardiovascular:     Rate and Rhythm: Normal rate and regular rhythm.     Heart sounds: No murmur heard.    No friction rub. No gallop.  Pulmonary:     Effort: Pulmonary effort is normal.     Breath sounds: Normal breath sounds.  Abdominal:     General: Abdomen is flat.     Palpations: Abdomen is soft.  Musculoskeletal:        General: Normal range of motion.  Skin:    General: Skin is warm and dry.  Neurological:     General: No focal deficit present.     Mental Status: She is alert and oriented to person, place, and time.  Psychiatric:        Mood and Affect: Mood normal.       Lab Results Lab Results  Component Value Date   WBC 13.1 (H) 10/20/2021   HGB 7.7 (L) 10/20/2021   HCT 23.0 (L) 10/20/2021   MCV 96.2 10/20/2021   PLT 791 (H) 10/20/2021    Lab Results  Component Value Date   CREATININE 1.18 (H) 10/20/2021   BUN 13 10/20/2021   NA 134 (L) 10/20/2021   K 4.4 10/20/2021   CL 99 10/20/2021   CO2 28 10/20/2021    Lab Results  Component Value Date   ALT 52 (H) 10/20/2021   AST 61 (H) 10/20/2021   ALKPHOS 151 (H) 10/20/2021   BILITOT 0.4 10/20/2021        Laurice Record, [place] for Infectious Disease [facility] 10/20/2021, 3:35 PM

## 2021-10-20 NOTE — Progress Notes (Signed)
Occupational Therapy Treatment Patient Details Name: [name] MRN: [id number] DOB: 1965-06-24 Today's Date: 10/20/2021   History of present illness Pt is a 56 y.o. female admitted 10/04/21 with c/o SOB, chest pain, N/V/D, loss of appetite. 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 S/p R perinephric aspiration 6/26. PMH includes CAD, DM, fibromyalgia, neuropathy, ischemic cardiomyopathy, sinus bradycardia, NASH.   OT comments  Patient received in bed and agreeable to OT session. Patient able to get to EOB without assistance and ambulate to sink for grooming with supervision. Patient able to donn socks seated in recliner without adaptive equipment. Mobility performed with RW and supervision for safety. Patient is making good progress and will continue to be followed by acute OT.    Recommendations for follow up therapy are one component of a multi-disciplinary discharge planning process, led by the attending physician.  Recommendations may be updated based on patient status, additional functional criteria and insurance authorization.    Follow Up Recommendations  Home health OT    Assistance Recommended at Discharge Intermittent Supervision/Assistance  Patient can return home with the following  A little help with bathing/dressing/bathroom;Assist for transportation;Assistance with cooking/housework   Equipment Recommendations  BSC/3in1    Recommendations for Other Services      Precautions / Restrictions Precautions Precautions: Fall Precaution Comments: hypotension Restrictions Weight Bearing Restrictions: No       Mobility Bed Mobility Overal bed mobility: Needs Assistance Bed Mobility: Sit to Supine     Supine to sit: Supervision     General bed mobility comments: supervision to get to EOB, asked to remain sitting on EOB at end of session    Transfers Overall transfer  level: Needs assistance Equipment used: Rolling walker (2 wheels) Transfers: Sit to/from Stand Sit to Stand: Supervision           General transfer comment: supervision for sit to stands, mobility, and transfers     Balance Overall balance assessment: Needs assistance Sitting-balance support: No upper extremity supported, Feet unsupported Sitting balance-Leahy Scale: Good     Standing balance support: Bilateral upper extremity supported, Single extremity supported, No upper extremity supported, During functional activity Standing balance-Leahy Scale: Fair Standing balance comment: able to static stand without UE support                           ADL either performed or assessed with clinical judgement   ADL Overall ADL's : Needs assistance/impaired     Grooming: Wash/dry hands;Wash/dry face;Standing;Supervision/safety Grooming Details (indicate cue type and reason): at sink             Lower Body Dressing: Supervision/safety;Sitting/lateral leans Lower Body Dressing Details (indicate cue type and reason): able to donn socks without an assistive device Toilet Transfer: Supervision/safety;Ambulation Toilet Transfer Details (indicate cue type and reason): simulated to recliner           General ADL Comments: performed well through pain    Extremity/Trunk Assessment Upper Extremity Assessment RUE Deficits / Details: baseline limited R shoulder overhead motion from prior injury            Vision       Perception     Praxis      Cognition Arousal/Alertness: Awake/alert Behavior During Therapy: WFL for tasks assessed/performed Overall Cognitive Status: Within Functional Limits for tasks assessed  General Comments: eager to keep moving        Exercises      Shoulder Instructions       General Comments      Pertinent Vitals/ Pain       Pain Assessment Pain Assessment: Faces Faces Pain  Scale: Hurts little more Pain Location: back Pain Descriptors / Indicators: Aching, Discomfort, Sore Pain Intervention(s): Limited activity within patient's tolerance, Monitored during session, Repositioned  Home Living                                          Prior Functioning/Environment              Frequency  Min 2X/week        Progress Toward Goals  OT Goals(current goals can now be found in the care plan section)  Progress towards OT goals: Progressing toward goals  Acute Rehab OT Goals Patient Stated Goal: get better OT Goal Formulation: With patient Time For Goal Achievement: 10/24/21 Potential to Achieve Goals: Good ADL Goals Pt Will Perform Grooming: with supervision Pt Will Perform Lower Body Bathing: with set-up Pt Will Perform Upper Body Dressing: with modified independence Pt Will Perform Lower Body Dressing: with modified independence Pt Will Transfer to Toilet: with modified independence  Plan Discharge plan remains appropriate;Frequency remains appropriate    Co-evaluation                 AM-PAC OT "6 Clicks" Daily Activity     Outcome Measure   Help from another person eating meals?: None Help from another person taking care of personal grooming?: A Little Help from another person toileting, which includes using toliet, bedpan, or urinal?: A Little Help from another person bathing (including washing, rinsing, drying)?: A Little Help from another person to put on and taking off regular upper body clothing?: A Little Help from another person to put on and taking off regular lower body clothing?: A Little 6 Click Score: 19    End of Session Equipment Utilized During Treatment: Rolling walker (2 wheels)  OT Visit Diagnosis: Muscle weakness (generalized) (M62.81)   Activity Tolerance Patient tolerated treatment well   Patient Left in bed;with call bell/phone within reach   Nurse Communication Mobility status         Time: 4166-0630 OT Time Calculation (min): 27 min  Charges: OT General Charges $OT Visit: 1 Visit OT Treatments $Self Care/Home Management : 8-22 mins $Therapeutic Activity: 8-22 mins  Lodema Hong, OTA Acute Rehabilitation Services  Office [phone]   Trixie Dredge 10/20/2021, 11:59 AM

## 2021-10-20 NOTE — Progress Notes (Signed)
Progress Note   Patient: [name] PJK:[id number] DOB: [birth year] DOA: 10/04/2021     15 DOS: the patient was seen and examined on 10/20/2021   Brief hospital course: 56 y.o. female with a history of with inferior STEMI in 2015, insulin-dependent diabetes mellitus, hypertension, and NASH. Patient presented secondary to nausea, vomiting, generalized aches and was found to be in septic shock from right emphysematous pyelonephritis. Patient empirically treated with Ceftriaxone and admitted to the ICU for vasopressor support after initial resuscitation with IV fluids failed. Urine and blood cultures were positive for E. Coli. Antibiotics transitioned to Cefazolin prior to transitioning back to Ceftriaxone. Perinephric aspiration performed on 6/26 by IR.  Assessment and Plan: Septic shock Present on admission. Secondary to pyelonephritis and E. Coli bacteremia. Patient required ICU on admission and norepinephrine for vasopressor support in addition to midodrine. Continue below abx   Right emphysematous pyelonephritis E. Coli bacteremia CT imaging on admission (6/18) was significant for gas collections within the right kidney concerning for emphysematous pyelonephritis. Blood and urine cultures positive for E. Coli bacteria. ID consulted. Patient transitioned to Ancef and is back on Ceftriaxone. WBC trended up but seems to have possibly reached a plateau. Repeat CT abdomen/pelvis with some evidence of disease improvement. IR consulted and performed CT guided right perinephric aspiration on 6/26 yielding 7 mL of purulent fluid; gram stain with GNRs. -ID recommendations: cont rocephin IV -ID recs to plan OPAT for 3 weeks of rocephin starting 7/3 if she continues to be stable or improving -CT abd ordered per Urology -continues with abd discomfort and flank pain this AM   Acute respiratory failure with hypoxia Secondary to pulmonary edema. Patient initially requiring BiPAP for management. Weaned to  room air. -Remains on minimal O2 support   Acute pulmonary edema Secondary to acute heart failure.   Acute on chronic systolic heart failure LVEF 40-45% on 6/19 which is slightly worse from previous baseline. Patient is on lisinopril as an outpatient. Acute failure likely secondary fluid resuscitation.  -Improved with doses of lasix recently -Cont to follow daily weights and strict in/out   AKI Secondary to ATN from septic shock and resultant hypotension. Peak creatinine of 3.07 which has now trended back down to baseline. -Cr stable at 1.18   High anion gap metabolic acidosis Likely secondary to AKI. Resolved with resolution of AKI.   Hypokalemia Treated with potassium supplementation. Resolved. -cont to follow bmet trends   Thrombocytopenia Unknown baseline. Possibly secondary to acute illness if acute vs NASH cirrhosis if chronic. Patient has received 3 units of platelets to date.  -Resolved   Hypoglycemia In setting of sepsis. Resolved.   Leukocytosis Peak of 20,700. Trending down. In setting of sepsis/pyelonephritis/abscesses WBC remains stable at 18.9   NASH Cirrhosis Upper abdominal varices Patient with along history of NASH, now with evidence of cirrhosis on CT imaging. Patient with a remote drinking history but not significant per report. Recent weight loss. Patient will need GI follow-up.   Diabetes mellitus, type 2 Diabetic neuropathy Patient is on insulin glargine 30 units qHS and metformin as an outpatient. She is on gabapentin 1,200 mg qHS for neuropathy. Hemoglobin A1C of 12.5%.  -On Novolog 10 units TID  -Increase semglee to 20 units BID per Diabetic Coordinator recs -Gabapentin 300 mg TID -Cont SSI as needed   Hypocalcemia Secondary to hypoalbuminemia. Corrected calcium is wnl.   Constipation Resolved with dulcolax suppository  L shoulder pain -Reports L shoulder pain after bumping into it during transfer to  bed while getting CT earlier -Recently  ordered and reviewed L shoulder xray, negative  Hypoalbuminemia -Will give '25mg'$  albumin       Subjective: Complaining of abd and flank pains  Physical Exam: Vitals:   10/19/21 1659 10/19/21 2053 10/20/21 0553 10/20/21 0924  BP: 100/68 98/66 (!) 112/56 (!) 117/59  Pulse: 84 81 80 84  Resp: '18 18 18 17  '$ Temp: 98 F (36.7 C) 99.5 F (37.5 C) 99.1 F (37.3 C) 98 F (36.7 C)  TempSrc:  Oral Oral   SpO2: 96% 96% 95% 92%  Weight:   74.6 kg   Height:       General exam: Conversant, in no acute distress Respiratory system: normal chest rise, clear, no audible wheezing Cardiovascular system: regular rhythm, s1-s2 Gastrointestinal system: Nondistended, nontender, pos BS Central nervous system: No seizures, no tremors Extremities: No cyanosis, no joint deformities Skin: No rashes, no pallor Psychiatry: Affect normal // no auditory hallucinations   Data Reviewed:  Labs reviewed: Na 134, K 4.5, Cr 1.12  Family Communication: Pt in room, family not at bedside  Disposition: Status is: Inpatient Remains inpatient appropriate because: Severity of illness  Planned Discharge Destination: Home     Author: Marylu Lund, MD 10/20/2021 3:46 PM  For on call review [email or web address].

## 2021-10-20 NOTE — Progress Notes (Signed)
Physical Therapy Treatment Patient Details Name: [name] MRN: [id number] DOB: [birth year] Today's Date: 10/20/2021   History of Present Illness Pt is a 56 y.o. female admitted 10/04/21 with c/o SOB, chest pain, N/V/D, loss of appetite. 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 S/p R perinephric aspiration 6/26. PMH includes CAD, DM, fibromyalgia, neuropathy, ischemic cardiomyopathy, sinus bradycardia, NASH.    PT Comments    Pt reports frustration with increased fluid build up and increased pain in back and joints. Pt is supervision for bed mobility and transfers and min guard for ambulation with RW and balance exercises. D/c plans remain appropriate at this time. PT will continue to follow acutely    Recommendations for follow up therapy are one component of a multi-disciplinary discharge planning process, led by the attending physician.  Recommendations may be updated based on patient status, additional functional criteria and insurance authorization.  Follow Up Recommendations  Home health PT     Assistance Recommended at Discharge Intermittent Supervision/Assistance  Patient can return home with the following A little help with walking and/or transfers;A little help with bathing/dressing/bathroom;Assistance with cooking/housework;Assist for transportation;Help with stairs or ramp for entrance   Equipment Recommendations  Rolling walker (2 wheels)    Recommendations for Other Services OT consult     Precautions / Restrictions Precautions Precautions: Fall;Other (comment) Precaution Comments: hypotension Restrictions Weight Bearing Restrictions: No     Mobility  Bed Mobility Overal bed mobility: Needs Assistance Bed Mobility: Sit to Supine     Supine to sit: Supervision     General bed mobility comments: supervision for safety    Transfers Overall transfer level:  Needs assistance Equipment used: Rolling walker (2 wheels) Transfers: Sit to/from Stand Sit to Stand: Supervision           General transfer comment: No assist needed, increased time due to pain in abdomen    Ambulation/Gait Ambulation/Gait assistance: Min guard Gait Distance (Feet): 1000 Feet Assistive device: Rolling walker (2 wheels) Gait Pattern/deviations: Step-through pattern, Decreased stride length, Trunk flexed Gait velocity: Decreased Gait velocity interpretation: 1.31 - 2.62 ft/sec, indicative of limited community ambulator   General Gait Details: Slow, fatigued gait with RW and intermittent min guard for balance; continued gait trial at end of ambulation without DME, pt reliant on HHA and frequently reaching to furniture/hallway rail for added stability, intermittent minA for balance       Balance Overall balance assessment: Needs assistance Sitting-balance support: No upper extremity supported, Feet unsupported Sitting balance-Leahy Scale: Good     Standing balance support: During functional activity, Reliant on assistive device for balance Standing balance-Leahy Scale: Fair Standing balance comment: able to static stand without UE support                            Cognition Arousal/Alertness: Awake/alert Behavior During Therapy: Flat affect Overall Cognitive Status: Within Functional Limits for tasks assessed                                 General Comments: frustrated with return of fluid build up        Exercises Other Exercises Other Exercises: dynamic balance exercises, attempted crossbody reaching however increased back pain worked on single and bilateral arm raises while    General Comments General comments (skin integrity, edema, etc.): VSS on RA  Pertinent Vitals/Pain Pain Assessment Pain Assessment: 0-10 Pain Score: 8  Pain Location: back, Pain Descriptors / Indicators: Sore, Tiring     PT Goals  (current goals can now be found in the care plan section) Acute Rehab PT Goals Patient Stated Goal: return home PT Goal Formulation: With patient/family Time For Goal Achievement: 10/23/21 Potential to Achieve Goals: Good Progress towards PT goals: Progressing toward goals    Frequency    Min 3X/week      PT Plan Current plan remains appropriate       AM-PAC PT "6 Clicks" Mobility   Outcome Measure  Help needed turning from your back to your side while in a flat bed without using bedrails?: None Help needed moving from lying on your back to sitting on the side of a flat bed without using bedrails?: A Little Help needed moving to and from a bed to a chair (including a wheelchair)?: A Little Help needed standing up from a chair using your arms (e.g., wheelchair or bedside chair)?: A Little Help needed to walk in hospital room?: A Little Help needed climbing 3-5 steps with a railing? : A Little 6 Click Score: 19    End of Session Equipment Utilized During Treatment: Gait belt Activity Tolerance: Patient tolerated treatment well;Patient limited by pain (headache and back ache) Patient left: with call bell/phone within reach;in bed Nurse Communication: Mobility status;Patient requests pain meds;Other (comment) (request for softer meal) PT Visit Diagnosis: Other abnormalities of gait and mobility (R26.89);Muscle weakness (generalized) (M62.81);Pain Pain - part of body:  (back, headache)     Time: 2979-8921 PT Time Calculation (min) (ACUTE ONLY): 23 min  Charges:  $Therapeutic Exercise: 23-37 mins                     Shandale Malak B. Migdalia Dk PT, DPT Acute Rehabilitation Services Please use secure chat or  Call Office [phone]    [place] 10/20/2021, 2:05 PM

## 2021-10-20 NOTE — Progress Notes (Signed)

## 2021-10-21 ENCOUNTER — Inpatient Hospital Stay (HOSPITAL_COMMUNITY): Payer: Commercial Managed Care - HMO

## 2021-10-21 DIAGNOSIS — N179 Acute kidney failure, unspecified: Secondary | ICD-10-CM | POA: Diagnosis not present

## 2021-10-21 DIAGNOSIS — N12 Tubulo-interstitial nephritis, not specified as acute or chronic: Secondary | ICD-10-CM | POA: Diagnosis not present

## 2021-10-21 DIAGNOSIS — J9601 Acute respiratory failure with hypoxia: Secondary | ICD-10-CM | POA: Diagnosis not present

## 2021-10-21 LAB — CBC
HCT: 24 % — ABNORMAL LOW (ref 36.0–46.0)
Hemoglobin: 8 g/dL — ABNORMAL LOW (ref 12.0–15.0)
MCH: 32.1 pg (ref 26.0–34.0)
MCHC: 33.3 g/dL (ref 30.0–36.0)
MCV: 96.4 fL (ref 80.0–100.0)
Platelets: 733 10*3/uL — ABNORMAL HIGH (ref 150–400)
RBC: 2.49 MIL/uL — ABNORMAL LOW (ref 3.87–5.11)
RDW: 15 % (ref 11.5–15.5)
WBC: 13.1 10*3/uL — ABNORMAL HIGH (ref 4.0–10.5)
nRBC: 0 % (ref 0.0–0.2)

## 2021-10-21 LAB — COMPREHENSIVE METABOLIC PANEL
ALT: 52 U/L — ABNORMAL HIGH (ref 0–44)
AST: 51 U/L — ABNORMAL HIGH (ref 15–41)
Albumin: 2.1 g/dL — ABNORMAL LOW (ref 3.5–5.0)
Alkaline Phosphatase: 153 U/L — ABNORMAL HIGH (ref 38–126)
Anion gap: 12 (ref 5–15)
BUN: 14 mg/dL (ref 6–20)
CO2: 28 mmol/L (ref 22–32)
Calcium: 8.7 mg/dL — ABNORMAL LOW (ref 8.9–10.3)
Chloride: 93 mmol/L — ABNORMAL LOW (ref 98–111)
Creatinine, Ser: 1.06 mg/dL — ABNORMAL HIGH (ref 0.44–1.00)
GFR, Estimated: >60 mL/min (ref >60)
Glucose, Bld: 199 mg/dL — ABNORMAL HIGH (ref 70–99)
Potassium: 4.5 mmol/L (ref 3.5–5.1)
Sodium: 133 mmol/L — ABNORMAL LOW (ref 135–145)
Total Bilirubin: 0.2 mg/dL — ABNORMAL LOW (ref 0.3–1.2)
Total Protein: 6.3 g/dL — ABNORMAL LOW (ref 6.5–8.1)

## 2021-10-21 LAB — GLUCOSE, CAPILLARY
Glucose-Capillary: 155 mg/dL — ABNORMAL HIGH (ref 70–99)
Glucose-Capillary: 142 mg/dL — ABNORMAL HIGH (ref 70–99)
Glucose-Capillary: 170 mg/dL — ABNORMAL HIGH (ref 70–99)
Glucose-Capillary: 136 mg/dL — ABNORMAL HIGH (ref 70–99)

## 2021-10-21 MED ORDER — FUROSEMIDE 10 MG/ML IJ SOLN
20.0000 mg | Freq: Once | INTRAMUSCULAR | Status: AC
Start: 2021-10-21 — End: 2021-10-21
  Administered 2021-10-21: 20 mg via INTRAVENOUS
  Filled 2021-10-21: qty 2

## 2021-10-21 MED ORDER — IOHEXOL 300 MG/ML  SOLN
100.0000 mL | Freq: Once | INTRAMUSCULAR | Status: AC | PRN
  Administered 2021-10-21: 100 mL via INTRAVENOUS

## 2021-10-21 MED ORDER — FLUCONAZOLE 150 MG PO TABS
150.0000 mg | ORAL_TABLET | Freq: Once | ORAL | Status: AC
Start: 2021-10-21 — End: 2021-10-22
  Administered 2021-10-22: 150 mg via ORAL
  Filled 2021-10-21: qty 1

## 2021-10-21 NOTE — Progress Notes (Signed)
Subjective: The patient is resting comfortably in bed.  Her only complaint is that her dentures no longer fit and she can't eat solid food.  CT from this AM shows slight interval improvement in her right emphysematous pyelonephritis with gas no longer visible within the renal parenchyma and notes possible left sided pyelonephritis.    Objective: Vital signs in last 24 hours: Temp:  [98.4 F (36.9 C)-99.6 F (37.6 C)] 98.4 F (36.9 C) (07/04 1633) Pulse Rate:  [80-88] 81 (07/04 1633) Resp:  [18] 18 (07/04 1633) BP: (104-124)/(51-66) 112/58 (07/04 1633) SpO2:  [88 %-96 %] 94 % (07/04 1633)  Intake/Output from previous day: 07/03 0701 - 07/04 0700 In: 1060 [P.O.:960; IV Piggyback:100] Out: 2850 [Urine:2850]  Intake/Output this shift: No intake/output data recorded.  Physical Exam:  General: Alert and oriented CV: RRR, palpable distal pulses   Lab Results: Recent Labs    10/19/21 0153 10/20/21 0603 10/21/21 0258  HGB 8.1* 7.7* 8.0*  HCT 24.0* 23.0* 24.0*   BMET Recent Labs    10/20/21 0603 10/21/21 0258  NA 134* 133*  K 4.4 4.5  CL 99 93*  CO2 28 28  GLUCOSE 158* 199*  BUN 13 14  CREATININE 1.18* 1.06*  CALCIUM 8.2* 8.7*     Studies/Results: CT ABDOMEN PELVIS W CONTRAST  Result Date: 10/21/2021 CLINICAL DATA:  Severe pyelonephritis. Status post CT-guided right perinephric abscess aspiration. EXAM: CT ABDOMEN AND PELVIS WITH CONTRAST TECHNIQUE: Multidetector CT imaging of the abdomen and pelvis was performed using the standard protocol following bolus administration of intravenous contrast. RADIATION DOSE REDUCTION: This exam was performed according to the departmental dose-optimization program which includes automated exposure control, adjustment of the mA and/or kV according to patient size and/or use of iterative reconstruction technique. CONTRAST:  15m OMNIPAQUE IOHEXOL 300 MG/ML  SOLN COMPARISON:  10/11/2021 FINDINGS: Lower chest: Small to moderate right  pleural effusion with overlying atelectasis is mildly increased in volume from previous exam. Subpleural atelectasis noted in the left base. Hepatobiliary: No focal liver abnormality is seen. No gallstones, gallbladder wall thickening, or biliary dilatation. Pancreas: Unremarkable. No pancreatic ductal dilatation or surrounding inflammatory changes. Spleen: Status post splenectomy. Adrenals/Urinary Tract: Normal adrenal glands. Right-sided perinephric fat stranding is again noted. Multiple areas of fluid attenuation within the right kidney are again identified. Compared with the previous exam there is been interval resolution gas within these fluid density areas. The largest arises off the medial aspect of the upper pole of right kidney measuring 3.9 x 3.5 cm, image 27/3. Formally this measured 4.1 x 2.9 cm. Fluid collection along the lateral aspect of the upper pole of right kidney measures 2.8 x 1.7 cm, image 27/3. Formally 2.8 x 2.0 cm. Fluid collection overlying the upper pole of the right kidney measures 2.9 by 1.9 cm, image 93/6. Previously 2.4 x 1.6 cm. Additional smaller areas of fluid density within the right kidney are unchanged. Striated nephrographic appearance of the left kidney is noted. Large area of lobar nephronia arising off the lateral cortex of the interpolar left kidney is improved in the interval, image 27/3. Similar appearance of right-sided pelvocaliectasis. No left-sided hydronephrosis. Urinary bladder is decompressed around a Foley catheter balloon. Stomach/Bowel: Stomach appears normal. The appendix is visualized and appears normal. No bowel wall thickening, inflammation, or distension. Vascular/Lymphatic: Aortic atherosclerosis. 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 Multiple prominent upper abdominal lymph nodes are identified likely reflecting reactive adenopathy. Reproductive: Status post hysterectomy. No adnexal masses. Other: No free fluid.  No signs of  pneumoperitoneum. Musculoskeletal: Diffuse body wall edema is favored to represent anasarca. No acute or suspicious osseous findings. Degenerative disc disease noted at L5-S1. IMPRESSION: 1. 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 There is been interval resolution of gas within the right kidney fluid collections. Volume of fluid collections appear stable in the interval. Progressive changes identified. 2. Left-sided pyelonephritis is again noted. Dominant area of lobar nephronia involving the left kidney has improved in the interval. 3. Small to moderate right pleural effusion with overlying atelectasis is mildly increased in volume from previous exam. 4. 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 5. Diffuse body wall edema is favored to represent anasarca. 6. Aortic Atherosclerosis (ICD10-I70.0). Electronically Signed   By: Kerby Moors M.D.   On: 10/21/2021 09:42    Assessment/Plan: Right emphysematous pyelonephritis  -CT shows progress with IV antibiotics.  No drainable fluid collection identified on imaging.  No acute surgical intervention warranted at this time.  Continue Rocephin.   LOS: 16 days   Ellison Hughs, MD [facility] Pager: [phone]  10/21/2021, 8:09 PM

## 2021-10-21 NOTE — Progress Notes (Signed)
Id brief note   Ct repeat reviewed Evolving changes/stable right kidney emphysematous pyelo Left pyelo as well noticed   Overall no significant worsening  Wbc stable 13   A/p Emphysematous pyelo  -continue ceftriaxone -await urology comment on ct finding

## 2021-10-21 NOTE — Progress Notes (Signed)
Progress Note   Patient: [name] YHC:[id number] DOB: 01-25-1966 DOA: 10/04/2021     16 DOS: the patient was seen and examined on 10/21/2021   Brief hospital course: 56 y.o. female with a history of with inferior STEMI in 2015, insulin-dependent diabetes mellitus, hypertension, and NASH. Patient presented secondary to nausea, vomiting, generalized aches and was found to be in septic shock from right emphysematous pyelonephritis. Patient empirically treated with Ceftriaxone and admitted to the ICU for vasopressor support after initial resuscitation with IV fluids failed. Urine and blood cultures were positive for E. Coli. Antibiotics transitioned to Cefazolin prior to transitioning back to Ceftriaxone. Perinephric aspiration performed on 6/26 by IR.  Assessment and Plan: Septic shock Present on admission. Secondary to pyelonephritis and E. Coli bacteremia. Patient required ICU on admission and norepinephrine for vasopressor support in addition to midodrine. Continue below abx   Right emphysematous pyelonephritis E. Coli bacteremia CT imaging on admission (6/18) was significant for gas collections within the right kidney concerning for emphysematous pyelonephritis. Blood and urine cultures positive for E. Coli bacteria. ID consulted. Patient transitioned to Ancef and is back on Ceftriaxone. WBC trended up but seems to have possibly reached a plateau. Repeat CT abdomen/pelvis with some evidence of disease improvement. IR consulted and performed CT guided right perinephric aspiration on 6/26 yielding 7 mL of purulent fluid; gram stain with GNRs. -ID recommendations: cont rocephin IV -Recent ID recs were noted for OPAT for 3 weeks of rocephin starting 7/3 if she continues to be stable or improving -Repeat CT abd performed, reviewed. Findings of findings compatible with areas of renal abscesses on R and L sided pyelo unchanged. ID recs to follow up on Urology recs -remains with some abd discomfort  this am   Acute respiratory failure with hypoxia Secondary to pulmonary edema. Patient initially requiring BiPAP for management. Weaned to room air. -Remains on minimal O2 support   Acute pulmonary edema Secondary to acute heart failure. CT findings of anasarca noted. Now off IVF Will give trial of '20mg'$  IV lasix   Acute on chronic systolic heart failure LVEF 40-45% on 6/19 which is slightly worse from previous baseline. Patient is on lisinopril as an outpatient. Acute failure likely secondary fluid resuscitation.  -Anasarca seen on CT abd.  -Giving trial of lasix   AKI Secondary to ATN from septic shock and resultant hypotension. Peak creatinine of 3.07 which has now trended back down to baseline. -Cr stable at 1.06   High anion gap metabolic acidosis Likely secondary to AKI. Resolved with resolution of AKI.   Hypokalemia Resolved   Thrombocytopenia Unknown baseline. Possibly secondary to acute illness if acute vs NASH cirrhosis if chronic. Patient has received 3 units of platelets to date.  -Resolved   Hypoglycemia In setting of sepsis. Resolved.   Leukocytosis Peak of 20,700. Trending down. In setting of sepsis/pyelonephritis/abscesses WBC remains stable at 18.9   NASH Cirrhosis Upper abdominal varices Patient with along history of NASH, now with evidence of cirrhosis on CT imaging. Patient with a remote drinking history but not significant per report. Recent weight loss. Patient will need GI follow-up.   Diabetes mellitus, type 2 Diabetic neuropathy Patient is on insulin glargine 30 units qHS and metformin as an outpatient. She is on gabapentin 1,200 mg qHS for neuropathy. Hemoglobin A1C of 12.5%.  -On Novolog 10 units TID  -Increase semglee to 20 units BID per Diabetic Coordinator recs -Gabapentin 300 mg TID -Cont SSI as needed -Glycemic trends stable   Hypocalcemia  Secondary to hypoalbuminemia. Corrected calcium is wnl.   Constipation Resolved with dulcolax  suppository  L shoulder pain -Reports L shoulder pain after bumping into it during transfer to bed while getting CT earlier -Recently ordered and reviewed L shoulder xray, negative  Hypoalbuminemia -Will give '25mg'$  albumin       Subjective: Still with abd discomfort this AM  Physical Exam: Vitals:   10/20/21 1746 10/20/21 2044 10/21/21 0510 10/21/21 0933  BP: 131/61 (!) 105/51 124/66 (!) 104/52  Pulse: 90 85 80 88  Resp: '18 18 18 18  '$ Temp: 98.2 F (36.8 C) 98.5 F (36.9 C) 99.4 F (37.4 C) 99.6 F (37.6 C)  TempSrc:    Oral  SpO2: 97% 94% (!) 88% 96%  Weight:      Height:       General exam: Awake, laying in bed, in nad Respiratory system: Normal respiratory effort, no wheezing Cardiovascular system: regular rate, s1, s2 Gastrointestinal system: Soft, nondistended, positive BS Central nervous system: CN2-12 grossly intact, strength intact Extremities: Perfused, no clubbing Skin: Normal skin turgor, no notable skin lesions seen Psychiatry: Mood normal // no visual hallucinations   Data Reviewed:  Labs reviewed: Na 133, K 4.5, Cr 1.06  Family Communication: Pt in room, family not at bedside  Disposition: Status is: Inpatient Remains inpatient appropriate because: Severity of illness  Planned Discharge Destination: Home     Author: Marylu Lund, MD 10/21/2021 3:08 PM  For on call review [email or web address].

## 2021-10-21 NOTE — Progress Notes (Signed)
Mobility Specialist: Progress Note   10/21/21 1524  Mobility  Activity Ambulated with assistance in hallway  Level of Assistance Modified independent, requires aide device or extra time  Assistive Device Front wheel walker  Distance Ambulated (ft) 450 ft  Activity Response Tolerated well  $Mobility charge 1 Mobility   Received pt in bed having no complaints and agreeable to mobility. Pt was asymptomatic throughout ambulation and returned to room w/o fault. Left in bed w/ call bell in reach and all needs met.  [facility] [name] Mobility Specialist Mobility Specialist [address]: [phone]

## 2021-10-22 ENCOUNTER — Encounter (HOSPITAL_COMMUNITY): Payer: Self-pay | Admitting: Internal Medicine

## 2021-10-22 DIAGNOSIS — E119 Type 2 diabetes mellitus without complications: Secondary | ICD-10-CM | POA: Diagnosis not present

## 2021-10-22 DIAGNOSIS — R7881 Bacteremia: Secondary | ICD-10-CM | POA: Diagnosis not present

## 2021-10-22 DIAGNOSIS — D72829 Elevated white blood cell count, unspecified: Secondary | ICD-10-CM | POA: Diagnosis not present

## 2021-10-22 DIAGNOSIS — N12 Tubulo-interstitial nephritis, not specified as acute or chronic: Secondary | ICD-10-CM | POA: Diagnosis not present

## 2021-10-22 DIAGNOSIS — B9620 Unspecified Escherichia coli [E. coli] as the cause of diseases classified elsewhere: Secondary | ICD-10-CM | POA: Diagnosis not present

## 2021-10-22 DIAGNOSIS — A419 Sepsis, unspecified organism: Secondary | ICD-10-CM | POA: Diagnosis not present

## 2021-10-22 LAB — CBC
HCT: 20.3 % — ABNORMAL LOW (ref 36.0–46.0)
HCT: 23 % — ABNORMAL LOW (ref 36.0–46.0)
Hemoglobin: 6.8 g/dL — CL (ref 12.0–15.0)
Hemoglobin: 7.5 g/dL — ABNORMAL LOW (ref 12.0–15.0)
MCH: 31.9 pg (ref 26.0–34.0)
MCH: 31.4 pg (ref 26.0–34.0)
MCHC: 33.5 g/dL (ref 30.0–36.0)
MCHC: 32.6 g/dL (ref 30.0–36.0)
MCV: 95.3 fL (ref 80.0–100.0)
MCV: 96.2 fL (ref 80.0–100.0)
Platelets: 682 10*3/uL — ABNORMAL HIGH (ref 150–400)
Platelets: 714 10*3/uL — ABNORMAL HIGH (ref 150–400)
RBC: 2.13 MIL/uL — ABNORMAL LOW (ref 3.87–5.11)
RBC: 2.39 MIL/uL — ABNORMAL LOW (ref 3.87–5.11)
RDW: 15 % (ref 11.5–15.5)
RDW: 14.8 % (ref 11.5–15.5)
WBC: 13 10*3/uL — ABNORMAL HIGH (ref 4.0–10.5)
WBC: 13.4 10*3/uL — ABNORMAL HIGH (ref 4.0–10.5)
nRBC: 0 % (ref 0.0–0.2)
nRBC: 0 % (ref 0.0–0.2)

## 2021-10-22 LAB — COMPREHENSIVE METABOLIC PANEL
ALT: 45 U/L — ABNORMAL HIGH (ref 0–44)
AST: 39 U/L (ref 15–41)
Albumin: 1.8 g/dL — ABNORMAL LOW (ref 3.5–5.0)
Alkaline Phosphatase: 139 U/L — ABNORMAL HIGH (ref 38–126)
Anion gap: 13 (ref 5–15)
BUN: 15 mg/dL (ref 6–20)
CO2: 27 mmol/L (ref 22–32)
Calcium: 8.5 mg/dL — ABNORMAL LOW (ref 8.9–10.3)
Chloride: 95 mmol/L — ABNORMAL LOW (ref 98–111)
Creatinine, Ser: 1.3 mg/dL — ABNORMAL HIGH (ref 0.44–1.00)
GFR, Estimated: 48 mL/min — ABNORMAL LOW (ref >60)
Glucose, Bld: 337 mg/dL — ABNORMAL HIGH (ref 70–99)
Potassium: 4.4 mmol/L (ref 3.5–5.1)
Sodium: 135 mmol/L (ref 135–145)
Total Bilirubin: 0.4 mg/dL (ref 0.3–1.2)
Total Protein: 5.8 g/dL — ABNORMAL LOW (ref 6.5–8.1)

## 2021-10-22 LAB — GLUCOSE, CAPILLARY
Glucose-Capillary: 174 mg/dL — ABNORMAL HIGH (ref 70–99)
Glucose-Capillary: 229 mg/dL — ABNORMAL HIGH (ref 70–99)
Glucose-Capillary: 140 mg/dL — ABNORMAL HIGH (ref 70–99)
Glucose-Capillary: 183 mg/dL — ABNORMAL HIGH (ref 70–99)

## 2021-10-22 NOTE — Progress Notes (Addendum)
[facility] for Infectious Disease  Date of Admission:  10/04/2021   Total days of inpatient antibiotics 16  Principal Problem:   Emphysematous pyelitis Active Problems:   Insulin-requiring or dependent type II diabetes mellitus (HCC)   Leukocytosis   CAD (coronary artery disease)   Hypotension   Thrombocytopenia (HCC)   AKI (acute kidney injury) ([place])   Elevated troponin   Acidosis   Cirrhosis (HCC)   Septic shock (HCC)   Acute respiratory failure with hypoxia (HCC)   E coli bacteremia          Assessment: 56 year old female with CAD, diabetes, NASH cirrhosis, C. 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 coli bacteremia due to emphysematous right-sided pyelonephritis this   #E. coli bacteremia 2/2 pyelonephritis #Emphysematous right-sided pyelonephritis  status post IR PERC drain placement. #Leukocytosis-trending down - Unable to leave drain in, only 7 cc of purulent fluid.. Cx + Ecoli - Patient reports abdominal pain has worsened today as compared to last couple of days -CT on 6/26 showed small perirenal fluid collection with gas. Pt had worsened left flank pain. Repeat CT showed multifocal  fluid attenuating structures in rt kidney c/w renal abscesses 2/2 emphysematous cystitis.  -She continues to have rt sided pain on day 19 of antibiotics without resolution of abscesses. Spoke with Urology Dr. MacDiarmid(per primary's request-Dr. Wynelle Cleveland) in regards to possible intervention of renal abscesses, recommended consulting IR Recommendations: -Continue ceftriaxone -Consult IR to see if abscesses are drainable for source control -Continue to follow clinically  I spent more than 70 minutes for this patient encounter including reviewing data/chart, and coordinating care and >50% direct face to face time providing counseling/discussing diagnostics/treatment plan with  patient  Microbiology:   Antibiotics: Ceftriaxone 6/17-p Cultures: Blood 6/17 2/2 ecoli Urine 6/17 ecoli Other   SUBJECTIVE: Resting in bed. Continues to reprot right flank pain.  Interval: Afebrile overnight. Wbc 13k Review of Systems: Review of Systems  All other systems reviewed and are negative.    Scheduled Meds:  sodium chloride   Intravenous Once   Chlorhexidine Gluconate Cloth  6 each Topical Daily   DULoxetine  60 mg Oral QHS   gabapentin  300 mg Oral TID   insulin aspart  0-15 Units Subcutaneous TID WC   insulin aspart  0-5 Units Subcutaneous QHS   insulin aspart  10 Units Subcutaneous TID WC   insulin glargine-yfgn  20 Units Subcutaneous BID   multivitamin with minerals  1 tablet Oral Daily   pantoprazole  40 mg Oral Daily   polyethylene glycol  17 g Oral BID   senna-docusate  1 tablet Oral BID   simvastatin  40 mg Oral QHS   sodium chloride flush  10-40 mL Intracatheter Q12H   sodium chloride flush  3 mL Intravenous Q12H   sorbitol, milk of mag, mineral oil, glycerin (SMOG) enema  960 mL Rectal Once   Continuous Infusions:  sodium chloride Stopped (10/05/21 0517)   cefTRIAXone (ROCEPHIN)  IV 2 g (10/21/21 1804)   PRN Meds:.acetaminophen **OR** acetaminophen, lip balm, ondansetron **OR** ondansetron (ZOFRAN) IV, mouth rinse, sodium chloride flush, traMADol Allergies  Allergen Reactions   Aspartame And Phenylalanine Anaphylaxis, Shortness Of Breath and Swelling    Throat swells  ANY ARTIFICAL SWEETNERS   Toradol [Ketorolac Tromethamine] Other (See Comments)    Face & neck flushed red & pt felt very hot after IV  adm.   Sucralose Rash    Pt.s face broke out in rash after drinking Breeza.  Pt. Claims it is from the artificial sweetener.      Codeine Swelling   Dilaudid [Hydromorphone Hcl] Itching   Hydrocodone Itching   Oxycodone Itching    OBJECTIVE: Vitals:   10/21/21 1633 10/21/21 2117 10/22/21 0654 10/22/21 0854  BP: (!) 112/58 (!) 104/50  113/62 (!) 99/48  Pulse: 81 88 76 82  Resp: '18  14 18  '$ Temp: 98.4 F (36.9 C) 99.4 F (37.4 C) 98.7 F (37.1 C)   TempSrc:  Oral    SpO2: 94% 96% 96% 93%  Weight:      Height:       Body mass index is 29.6 kg/m.  Physical Exam Constitutional:      Appearance: Normal appearance.  HENT:     Head: Normocephalic and atraumatic.     Right Ear: Tympanic membrane normal.     Left Ear: Tympanic membrane normal.     Nose: Nose normal.     Mouth/Throat:     Mouth: Mucous membranes are moist.  Eyes:     Extraocular Movements: Extraocular movements intact.     Conjunctiva/sclera: Conjunctivae normal.     Pupils: Pupils are equal, round, and reactive to light.  Cardiovascular:     Rate and Rhythm: Normal rate and regular rhythm.     Heart sounds: No murmur heard.    No friction rub. No gallop.  Pulmonary:     Effort: Pulmonary effort is normal.     Breath sounds: Normal breath sounds.  Abdominal:     General: Abdomen is flat.     Palpations: Abdomen is soft.  Musculoskeletal:        General: Normal range of motion.  Skin:    General: Skin is warm and dry.  Neurological:     General: No focal deficit present.     Mental Status: She is alert and oriented to person, place, and time.  Psychiatric:        Mood and Affect: Mood normal.       Lab Results Lab Results  Component Value Date   WBC 13.0 (H) 10/22/2021   HGB 6.8 (LL) 10/22/2021   HCT 20.3 (L) 10/22/2021   MCV 95.3 10/22/2021   PLT 682 (H) 10/22/2021    Lab Results  Component Value Date   CREATININE 1.30 (H) 10/22/2021   BUN 15 10/22/2021   NA 135 10/22/2021   K 4.4 10/22/2021   CL 95 (L) 10/22/2021   CO2 27 10/22/2021    Lab Results  Component Value Date   ALT 45 (H) 10/22/2021   AST 39 10/22/2021   ALKPHOS 139 (H) 10/22/2021   BILITOT 0.4 10/22/2021        Laurice Record, [facility] 10/22/2021, 11:28 AM

## 2021-10-22 NOTE — Progress Notes (Signed)
Subjective/Chief Complaint:  1 - Emphysematous Pyelonephritis / Small Renal Abscesses - Admitted 6/17 with sepsis / shock. Abscess CX from aspiration 6/26 E. Coli RES Amp, but sens other, placed on rocephin. A1c 12s on admit.  Recent Course: 6/26 - CT aspiration renal abscess, no hydro. 7/4 - CT resolved gas, slowly improving intra-renal abscesses (dominant Rt medial 3.8cm, Rt lateral 2.7cm),   Today "[name]" is stable. Still some low grade fevers. WBC 13, Cr 1.3. Additional CT aspiration pending for new CX data.     Objective: Vital signs in last 24 hours: Temp:  [98.4 F (36.9 C)-99.4 F (37.4 C)] 98.7 F (37.1 C) (07/05 0654) Pulse Rate:  [76-88] 82 (07/05 0854) Resp:  [14-18] 18 (07/05 0854) BP: (99-113)/(48-62) 99/48 (07/05 0854) SpO2:  [93 %-96 %] 93 % (07/05 0854) Last BM Date : 10/20/21  Intake/Output from previous day: 07/04 0701 - 07/05 0700 In: 720 [P.O.:720] Out: 1800 [Urine:1800] Intake/Output this shift: Total I/O In: 320 [P.O.:320] Out: 1700 [Urine:1700]  General appearance: alert, cooperative, and in good spirits Eyes: negative Nose: Nares normal. Septum midline. Mucosa normal. No drainage or sinus tenderness. Throat: lips, mucosa, and tongue normal; teeth and gums normal Back: symmetric, no curvature. ROM normal. No CVA tenderness. Resp: non-labored on room air. Extremities: extremities normal, atraumatic, no cyanosis or edema and hot pink nails which she is proud of. Pulses: 2+ and symmetric Neurologic: Grossly normal  Lab Results:  Recent Labs    10/22/21 0301 10/22/21 1106  WBC 13.0* 13.4*  HGB 6.8* 7.5*  HCT 20.3* 23.0*  PLT 682* 714*   BMET Recent Labs    10/21/21 0258 10/22/21 0301  NA 133* 135  K 4.5 4.4  CL 93* 95*  CO2 28 27  GLUCOSE 199* 337*  BUN 14 15  CREATININE 1.06* 1.30*  CALCIUM 8.7* 8.5*   PT/INR No results for input(s): "LABPROT", "INR" in the last 72 hours. ABG No results for input(s): "PHART", "HCO3" in  the last 72 hours.  Invalid input(s): "PCO2", "PO2"  Studies/Results: CT ABDOMEN PELVIS W CONTRAST  Result Date: 10/21/2021 CLINICAL DATA:  Severe pyelonephritis. Status post CT-guided right perinephric abscess aspiration. EXAM: CT ABDOMEN AND PELVIS WITH CONTRAST TECHNIQUE: Multidetector CT imaging of the abdomen and pelvis was performed using the standard protocol following bolus administration of intravenous contrast. RADIATION DOSE REDUCTION: This exam was performed according to the departmental dose-optimization program which includes automated exposure control, adjustment of the mA and/or kV according to patient size and/or use of iterative reconstruction technique. CONTRAST:  129m OMNIPAQUE IOHEXOL 300 MG/ML  SOLN COMPARISON:  10/11/2021 FINDINGS: Lower chest: Small to moderate right pleural effusion with overlying atelectasis is mildly increased in volume from previous exam. Subpleural atelectasis noted in the left base. Hepatobiliary: No focal liver abnormality is seen. No gallstones, gallbladder wall thickening, or biliary dilatation. Pancreas: Unremarkable. No pancreatic ductal dilatation or surrounding inflammatory changes. Spleen: Status post splenectomy. Adrenals/Urinary Tract: Normal adrenal glands. Right-sided perinephric fat stranding is again noted. Multiple areas of fluid attenuation within the right kidney are again identified. Compared with the previous exam there is been interval resolution gas within these fluid density areas. The largest arises off the medial aspect of the upper pole of right kidney measuring 3.9 x 3.5 cm, image 27/3. Formally this measured 4.1 x 2.9 cm. Fluid collection along the lateral aspect of the upper pole of right kidney measures 2.8 x 1.7 cm, image 27/3. Formally 2.8 x 2.0 cm. Fluid collection overlying the upper  pole of the right kidney measures 2.9 by 1.9 cm, image 93/6. Previously 2.4 x 1.6 cm. Additional smaller areas of fluid density within the right  kidney are unchanged. Striated nephrographic appearance of the left kidney is noted. Large area of lobar nephronia arising off the lateral cortex of the interpolar left kidney is improved in the interval, image 27/3. Similar appearance of right-sided pelvocaliectasis. No left-sided hydronephrosis. Urinary bladder is decompressed around a Foley catheter balloon. Stomach/Bowel: Stomach appears normal. The appendix is visualized and appears normal. No bowel wall thickening, inflammation, or distension. Vascular/Lymphatic: Aortic atherosclerosis. 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 Multiple prominent upper abdominal lymph nodes are identified likely reflecting reactive adenopathy. Reproductive: Status post hysterectomy. No adnexal masses. Other: No free fluid.  No signs of pneumoperitoneum. Musculoskeletal: Diffuse body wall edema is favored to represent anasarca. No acute or suspicious osseous findings. Degenerative disc disease noted at L5-S1. IMPRESSION: 1. 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 There is been interval resolution of gas within the right kidney fluid collections. Volume of fluid collections appear stable in the interval. Progressive changes identified. 2. Left-sided pyelonephritis is again noted. Dominant area of lobar nephronia involving the left kidney has improved in the interval. 3. Small to moderate right pleural effusion with overlying atelectasis is mildly increased in volume from previous exam. 4. 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 5. Diffuse body wall edema is favored to represent anasarca. 6. Aortic Atherosclerosis (ICD10-I70.0). Electronically Signed   By: Kerby Moors M.D.   On: 10/21/2021 09:42    Anti-infectives: Anti-infectives (From admission, onward)    Start     Dose/Rate Route Frequency Ordered Stop   10/21/21 2315   fluconazole (DIFLUCAN) tablet 150 mg        150 mg Oral  Once 10/21/21 2229 10/22/21 0004   10/08/21 1730  cefTRIAXone (ROCEPHIN) 2 g in sodium chloride 0.9 % 100 mL IVPB        2 g 200 mL/hr over 30 Minutes Intravenous Every 24 hours 10/08/21 1633     10/07/21 2000  ceFAZolin (ANCEF) IVPB 2g/100 mL premix  Status:  Discontinued        2 g 200 mL/hr over 30 Minutes Intravenous Every 8 hours 10/07/21 0936 10/08/21 1633   10/05/21 2200  cefTRIAXone (ROCEPHIN) 2 g in sodium chloride 0.9 % 100 mL IVPB  Status:  Discontinued        2 g 200 mL/hr over 30 Minutes Intravenous Every 24 hours 10/05/21 0358 10/05/21 0449   10/05/21 2000  cefTRIAXone (ROCEPHIN) 2 g in sodium chloride 0.9 % 100 mL IVPB  Status:  Discontinued        2 g 200 mL/hr over 30 Minutes Intravenous Every 24 hours 10/05/21 1819 10/07/21 0936   10/05/21 0515  meropenem (MERREM) 1 g in sodium chloride 0.9 % 100 mL IVPB  Status:  Discontinued        1 g 200 mL/hr over 30 Minutes Intravenous Every 12 hours 10/05/21 0502 10/05/21 1818   10/05/21 0300  cefTRIAXone (ROCEPHIN) 1 g in sodium chloride 0.9 % 100 mL IVPB        1 g 200 mL/hr over 30 Minutes Intravenous  Once 10/05/21 0254 10/05/21 0335       Assessment/Plan:   1 - Emphysematous Pyelonephritis / Small Renal Abscesses - only predisposing factor severe diabetes. No overt obstruction / anatomic contributers. This  will likely slowly resolve with prolonged ABX and glycemic control. Greatly appreciate hospitalist, ID,  and radiology/IR teams comanagment. No surgical indications as there does not appear to be any obstruction and slowly improving with therapy. She understands that outpatient glycemic control will be biggest priority to prevent recurrence after discharge.   Agree with re-attempt aspiration as collection >2cm on right x2 for new CX data and to further remove residual nidus.   Please call me if any questions this week in Dr. Purvis Sheffield absence.    Alexis Frock 10/22/2021

## 2021-10-22 NOTE — Progress Notes (Signed)
Nutrition Follow-up  DOCUMENTATION CODES:   Not applicable  INTERVENTION:   [employer] Breakfast Essentials TID, each packet mixed with 8 ounces of whole milk provides 13 grams of protein and 290 calories  Continue MVI with Minerals  Add chopped meats to current diet order given pt is edentulous  NUTRITION DIAGNOSIS:   Inadequate oral intake related to acute illness, nausea, vomiting, decreased appetite as evidenced by meal completion < 25%.  Being addressed via supplements  GOAL:   Patient will meet greater than or equal to 90% of their needs  Progressing  MONITOR:   PO intake, Supplement acceptance  REASON FOR ASSESSMENT:   Consult Assessment of nutrition requirement/status  ASSESSMENT:   56 yo female admitted with emphysematous pyelonephritis. PMH includes CAD, DM, HTN, NASH, ischemic cardiomyopathy, GERD, HLD.  6/26 IR perc drain, unable to leave drain, only 7 mL pus removed  Repeat CT reveals areas of renal abscesses in the right and the left Pt reports abdomen and back pain continue Pt working with OT/PT  PO intake variable, 0-100% of meals. Pt reports her appetite all depends on what she receives for meals. Today she had spaghetti which she enjoys and had eaten most of it on visit today.   Pt reports she is drinking 100% of the [employer]; pt reports she only needs 1 milk, not 2, and wants to switch to whole milk (this provides more calories so RD agrees with this). RD to modify orders.   Mild edema present  Pt reports she remains concerned about her discharge to home given she is worried about her stability and balance and that here apartment is not set up well for this. Pt reports PT/OT are aware of her concerns.   Pt also reports social concerns including pt wondering if she should apply for disability; pt has been out of work for 2 years due to an accident but was recently looking for a job. Pt reports she won money from a settlement  that was paying the bills but that is running out. I told pt I would reach out to Social Work/Transitions of Care Team  Labs: CBGs [phone] Meds: ss novolog, novolog with meals, semglee, MVI with Minerals, miralax, senna-docusate   Diet Order:   Diet Order             Diet Carb Modified Fluid consistency: Thin; Room service appropriate? Yes  Diet effective now                   EDUCATION NEEDS:   Not appropriate for education at this time  Skin:  Skin Assessment: Reviewed RN Assessment  Last BM:  7/03  Height:   Ht Readings from Last 1 Encounters:  10/06/21 5' 2.5" (1.588 m)    Weight:   Wt Readings from Last 1 Encounters:  10/20/21 74.6 kg    Ideal Body Weight:  51.1 kg  BMI:  Body mass index is 29.6 kg/m.  Estimated Nutritional Needs:   Kcal:  1900-2100  Protein:  90-100 gm  Fluid:  1.9-2.1 L   Kerman Passey MS, RDN, LDN, CNSC Registered Dietitian III Clinical Nutrition RD Pager and On-Call Pager Number Located in [place]

## 2021-10-22 NOTE — Progress Notes (Signed)
Critical lab called hgb 6.8 DR Opyd notified no orders given.

## 2021-10-22 NOTE — Progress Notes (Signed)
PT Cancellation Note  Patient Details Name: [name] MRN: [id number] DOB: Jan 02, 1966   Cancelled Treatment:    Reason Eval/Treat Not Completed: (P) Other (comment) Pt asleep on entry, not easily roused. PT will follow back for treatment tomorrow.   Jovita Persing B. Migdalia Dk PT, DPT Acute Rehabilitation Services Please use secure chat or  Call Office [phone]  [place] 10/22/2021, 4:00 PM

## 2021-10-22 NOTE — Progress Notes (Signed)
Referring Physician(s): Dr. Debbe Odea  Supervising Physician: Mir, Sharen Heck  Patient Status:  [facility] - In-pt  Chief Complaint: Emphysematous pyelonephritis  Subjective: Resting comfortably.   States she had back pain after aspiration last week. Aware of plans for drainage.   Allergies: Aspartame and phenylalanine, Toradol [ketorolac tromethamine], Sucralose, Codeine, Dilaudid [hydromorphone hcl], Hydrocodone, and Oxycodone  Medications: Prior to Admission medications   Medication Sig Start Date End Date Taking? Authorizing Provider  albuterol (VENTOLIN HFA) 108 (90 Base) MCG/ACT inhaler Inhale 2 puffs into the lungs every 4 (four) hours as needed for shortness of breath or wheezing. 05/27/21  Yes [provider]  Cholecalciferol (VITAMIN D3) 1.25 MG (50000 UT) CAPS Take 50,000 Units by mouth every Sunday. 10/02/21  Yes [provider]  DULoxetine (CYMBALTA) 60 MG capsule Take 60 mg by mouth at bedtime. 09/16/21  Yes [provider]  famotidine (PEPCID) 20 MG tablet Take 20-40 mg by mouth daily as needed for heartburn or indigestion.   Yes [provider]  gabapentin (NEURONTIN) 600 MG tablet Take 1,200 mg by mouth at bedtime. 08/12/21  Yes [provider]  glimepiride (AMARYL) 4 MG tablet Take 4 mg by mouth daily. 10/02/21  Yes [provider]  HYDROcodone-acetaminophen (NORCO) 7.5-325 MG tablet Take 1 tablet by mouth every 6 (six) hours as needed for moderate pain.   Yes [provider]  ibuprofen (ADVIL) 200 MG tablet Take 800 mg by mouth every 6 (six) hours as needed for mild pain.   Yes [provider]  ibuprofen (ADVIL) 800 MG tablet Take 800 mg by mouth every 8 (eight) hours as needed for mild pain.   Yes [provider]  Insulin Glargine (BASAGLAR KWIKPEN) 100 UNIT/ML Inject 30 Units into the skin at bedtime.   Yes [provider]  lisinopril (ZESTRIL) 10 MG tablet Take 10 mg by mouth daily.  09/16/21  Yes [provider]  metFORMIN (GLUCOPHAGE) 1000 MG tablet Take 1,000 mg by mouth 2 (two) times daily. 09/07/21  Yes [provider]  Probiotic Product (PROBIOTIC PO) Take 1 capsule by mouth daily.   Yes [provider]  simvastatin (ZOCOR) 40 MG tablet Take 40 mg by mouth at bedtime. 09/14/21  Yes [provider]     Vital Signs: BP (!) 99/48   Pulse 82   Temp 98.7 F (37.1 C)   Resp 18   Ht 5' 2.5" (1.588 m)   Wt 164 lb 7.4 oz (74.6 kg)   SpO2 93%   BMI 29.60 kg/m   Physical Exam Vitals and nursing note reviewed.  Constitutional:      General: She is not in acute distress.    Appearance: She is well-developed. She is not ill-appearing.  Cardiovascular:     Heart sounds: Normal heart sounds.  Pulmonary:     Effort: Pulmonary effort is normal.     Breath sounds: Normal breath sounds.  Musculoskeletal:        General: Normal range of motion.  Skin:    General: Skin is warm and dry.  Neurological:     General: No focal deficit present.     Mental Status: She is alert and oriented to person, place, and time.  Psychiatric:        Mood and Affect: Mood normal.        Behavior: Behavior normal.     Imaging: CT ABDOMEN PELVIS W CONTRAST  Result Date: 10/21/2021 CLINICAL DATA:  Severe pyelonephritis. Status post CT-guided  right perinephric abscess aspiration. EXAM: CT ABDOMEN AND PELVIS WITH CONTRAST TECHNIQUE: Multidetector CT imaging of the abdomen and pelvis was performed using the standard protocol following bolus administration of intravenous contrast. RADIATION DOSE REDUCTION: This exam was performed according to the departmental dose-optimization program which includes automated exposure control, adjustment of the mA and/or kV according to patient size and/or use of iterative reconstruction technique. CONTRAST:  138m OMNIPAQUE IOHEXOL 300 MG/ML  SOLN COMPARISON:  10/11/2021 FINDINGS: Lower chest: Small to moderate right pleural  effusion with overlying atelectasis is mildly increased in volume from previous exam. Subpleural atelectasis noted in the left base. Hepatobiliary: No focal liver abnormality is seen. No gallstones, gallbladder wall thickening, or biliary dilatation. Pancreas: Unremarkable. No pancreatic ductal dilatation or surrounding inflammatory changes. Spleen: Status post splenectomy. Adrenals/Urinary Tract: Normal adrenal glands. Right-sided perinephric fat stranding is again noted. Multiple areas of fluid attenuation within the right kidney are again identified. Compared with the previous exam there is been interval resolution gas within these fluid density areas. The largest arises off the medial aspect of the upper pole of right kidney measuring 3.9 x 3.5 cm, image 27/3. Formally this measured 4.1 x 2.9 cm. Fluid collection along the lateral aspect of the upper pole of right kidney measures 2.8 x 1.7 cm, image 27/3. Formally 2.8 x 2.0 cm. Fluid collection overlying the upper pole of the right kidney measures 2.9 by 1.9 cm, image 93/6. Previously 2.4 x 1.6 cm. Additional smaller areas of fluid density within the right kidney are unchanged. Striated nephrographic appearance of the left kidney is noted. Large area of lobar nephronia arising off the lateral cortex of the interpolar left kidney is improved in the interval, image 27/3. Similar appearance of right-sided pelvocaliectasis. No left-sided hydronephrosis. Urinary bladder is decompressed around a Foley catheter balloon. Stomach/Bowel: Stomach appears normal. The appendix is visualized and appears normal. No bowel wall thickening, inflammation, or distension. Vascular/Lymphatic: Aortic atherosclerosis. 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 Multiple prominent upper abdominal lymph nodes are identified likely reflecting reactive adenopathy. Reproductive: Status post hysterectomy. No adnexal masses. Other: No free fluid.  No signs of pneumoperitoneum.  Musculoskeletal: Diffuse body wall edema is favored to represent anasarca. No acute or suspicious osseous findings. Degenerative disc disease noted at L5-S1. IMPRESSION: 1. 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 There is been interval resolution of gas within the right kidney fluid collections. Volume of fluid collections appear stable in the interval. Progressive changes identified. 2. Left-sided pyelonephritis is again noted. Dominant area of lobar nephronia involving the left kidney has improved in the interval. 3. Small to moderate right pleural effusion with overlying atelectasis is mildly increased in volume from previous exam. 4. 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 5. Diffuse body wall edema is favored to represent anasarca. 6. Aortic Atherosclerosis (ICD10-I70.0). Electronically Signed   By: TKerby MoorsM.D.   On: 10/21/2021 09:42    Labs:  CBC: Recent Labs    10/20/21 0603 10/21/21 0258 10/22/21 0301 10/22/21 1106  WBC 13.1* 13.1* 13.0* 13.4*  HGB 7.7* 8.0* 6.8* 7.5*  HCT 23.0* 24.0* 20.3* 23.0*  PLT 791* 733* 682* 714*    COAGS: Recent Labs    10/04/21 2241 10/06/21 1808 10/07/21 0611  INR 1.2 1.2 1.1  APTT  --  28  --     BMP: Recent Labs    10/19/21 0153 10/20/21 0603 10/21/21 0258 10/22/21  0301  NA 134* 134* 133* 135  K 4.5 4.4 4.5 4.4  CL 96* 99 93* 95*  CO2 '27 28 28 27  '$ GLUCOSE 96 158* 199* 337*  BUN '12 13 14 15  '$ CALCIUM 8.6* 8.2* 8.7* 8.5*  CREATININE 1.12* 1.18* 1.06* 1.30*  GFRNONAA 58* 54* >60 48*    LIVER FUNCTION TESTS: Recent Labs    10/19/21 0153 10/20/21 0603 10/21/21 0258 10/22/21 0301  BILITOT 0.3 0.4 0.2* 0.4  AST 54* 61* 51* 39  ALT 47* 52* 52* 45*  ALKPHOS 150* 151* 153* 139*  PROT 6.8 6.1* 6.3* 5.8*  ALBUMIN 2.0* 1.7* 2.1* 1.8*    Assessment and Plan: Emphysematous pyelonephritis Patient s/p  aspiration of renal abscess 6/26 by Dr. Serafina Royals with aspiration of 44m purulent fluid which was positive for E.coli.  Patient with slight improvement in her WBC, however this remains elevated with ongoing abdominal pain and persistent renal collections per CT.  IR consulted for repeat aspiration vs. Drainage.  Case reviewed and approved by Dr. MDwaine Gale  NPO p MN.   Risks and benefits discussed with the patient including bleeding, infection, damage to adjacent structures, bowel perforation/fistula connection, and sepsis.  All of the patient's questions were answered, patient is agreeable to proceed. Consent signed and in chart.  Electronically Signed: KDocia Barrier PA 10/22/2021, 3:43 PM   I spent a total of 15 Minutes at the the patient's bedside AND on the patient's hospital floor or unit, greater than 50% of which was counseling/coordinating care for emphysematous pyelonephritis.

## 2021-10-22 NOTE — Progress Notes (Signed)
Occupational Therapy Treatment Patient Details Name: [name] MRN: [id number] DOB: [birth year] Today's Date: 10/22/2021   History of present illness Pt is a 56 y.o. female admitted 10/04/21 with c/o SOB, chest pain, N/V/D, loss of appetite. 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 S/p R perinephric aspiration 6/26. PMH includes CAD, DM, fibromyalgia, neuropathy, ischemic cardiomyopathy, sinus bradycardia, NASH.   OT comments  Patient received in supine and agreeable to OT session. Patient able to get to EOB with supervision and performed grooming tasks standing at sink with supervision without an assistive device. Patient stated she was wanting to return home but was also nervous due to her balance and endurance. Patient performed item retrieval and transfer training in room without an assistive device with supervision. Patient performed mobility in hallway with RW and close supervision. Acute OT to continue to follow.    Recommendations for follow up therapy are one component of a multi-disciplinary discharge planning process, led by the attending physician.  Recommendations may be updated based on patient status, additional functional criteria and insurance authorization.    Follow Up Recommendations  Home health OT    Assistance Recommended at Discharge Intermittent Supervision/Assistance  Patient can return home with the following  A little help with bathing/dressing/bathroom;Assist for transportation;Assistance with cooking/housework   Equipment Recommendations  BSC/3in1    Recommendations for Other Services      Precautions / Restrictions Precautions Precautions: Fall;Other (comment) Precaution Comments: hypotension Restrictions Weight Bearing Restrictions: No       Mobility Bed Mobility Overal bed mobility: Needs Assistance Bed Mobility: Sit to Supine     Supine to sit:  Supervision     General bed mobility comments: supervision for safety    Transfers Overall transfer level: Needs assistance Equipment used: Rolling walker (2 wheels), None Transfers: Sit to/from Stand Sit to Stand: Supervision           General transfer comment: performed mobility and transfers in her room without an assistive device and min guard assist for safety. Used RW for longer distances     Balance Overall balance assessment: Needs assistance Sitting-balance support: No upper extremity supported, Feet unsupported Sitting balance-Leahy Scale: Good     Standing balance support: During functional activity, Reliant on assistive device for balance, No upper extremity supported Standing balance-Leahy Scale: Fair Standing balance comment: able to static stand without UE support                           ADL either performed or assessed with clinical judgement   ADL Overall ADL's : Needs assistance/impaired     Grooming: Wash/dry hands;Wash/dry face;Brushing hair;Supervision/safety;Standing                                 General ADL Comments: performed item retrieval from closet in her room without an assistive device and min guard asssit    Extremity/Trunk Assessment              Vision       Perception     Praxis      Cognition Arousal/Alertness: Awake/alert Behavior During Therapy: Flat affect Overall Cognitive Status: Within Functional Limits for tasks assessed  General Comments: stated she was nervous about returning home        Exercises      Shoulder Instructions       General Comments      Pertinent Vitals/ Pain       Pain Assessment Pain Assessment: Faces Faces Pain Scale: Hurts a little bit Pain Location: back, Pain Descriptors / Indicators: Aching, Sore, Grimacing Pain Intervention(s): Limited activity within patient's tolerance, Monitored during session,  Repositioned  Home Living                                          Prior Functioning/Environment              Frequency  Min 2X/week        Progress Toward Goals  OT Goals(current goals can now be found in the care plan section)  Progress towards OT goals: Progressing toward goals  Acute Rehab OT Goals Patient Stated Goal: go home OT Goal Formulation: With patient Time For Goal Achievement: 10/24/21 Potential to Achieve Goals: Good ADL Goals Pt Will Perform Grooming: with supervision Pt Will Perform Lower Body Bathing: with set-up Pt Will Perform Upper Body Dressing: with modified independence Pt Will Perform Lower Body Dressing: with modified independence Pt Will Transfer to Toilet: with modified independence  Plan Discharge plan remains appropriate;Frequency remains appropriate    Co-evaluation                 AM-PAC OT "6 Clicks" Daily Activity     Outcome Measure   Help from another person eating meals?: None Help from another person taking care of personal grooming?: A Little Help from another person toileting, which includes using toliet, bedpan, or urinal?: A Little Help from another person bathing (including washing, rinsing, drying)?: A Little Help from another person to put on and taking off regular upper body clothing?: A Little Help from another person to put on and taking off regular lower body clothing?: A Little 6 Click Score: 19    End of Session Equipment Utilized During Treatment: Rolling walker (2 wheels)  OT Visit Diagnosis: Muscle weakness (generalized) (M62.81)   Activity Tolerance Patient tolerated treatment well   Patient Left in chair;with call bell/phone within reach   Nurse Communication Mobility status        Time: 4888-9169 OT Time Calculation (min): 31 min  Charges: OT General Charges $OT Visit: 1 Visit OT Treatments $Self Care/Home Management : 8-22 mins $Therapeutic Activity: 8-22  mins  Lodema Hong, [place]  Office [phone]   Trixie Dredge 10/22/2021, 2:00 PM

## 2021-10-22 NOTE — Progress Notes (Signed)
  Mobility Specialist Criteria Algorithm Info.    10/22/21 1430  Mobility  Activity Refused mobility   Patient sleep on entry on attempts x2. Hard to arouse and keep awake. Will f/u as time permits  07/22/6948 7:22 PM  [place] Omarii Scalzo, [place], [place]  VJDYN:[phone] Office: [phone]

## 2021-10-22 NOTE — Progress Notes (Signed)
[facility] Progress Note  Patient: [name]     YHC:[id number]  DOA: 10/04/2021   PCP: [facility]       Brief hospital course: This is a 56 year old with insulin requiring diabetes mellitus, hypertension, [name], history of STEMI who presented to the hospital for nausea vomiting and was found to be in septic shock related to emphysematous pyelonephritis.  She was started on ceftriaxone.  Eventually she was weaned off of vasopressors.  Urine and blood culture revealed E. coli. On 6/18, CT revealed gas collections in the right kidney On 6/26, she underwent a perinephric aspiration  Subjective:  Has pain all throughout her abdomen and in her right lower back. Assessment and Plan: Principal Problem:   Emphysematous pyelitis,  E coli pyelonephritis and bacteremia with Septic shock (HCC) -Currently on ceftriaxone - Repeat CT reveals areas of renal abscesses in the right and the left -Urology, Dr. Florence Canner does not feel that he can intervene on these areas - ID has recommended an IR consult to see if these areas can be aspirated  Active Problems:   Insulin-requiring or dependent type II diabetes mellitus ([place]) -Semglee twice daily and NovoLog - Continue to follow sugars    Thrombocytopenia ([place]) - resolved  Normocytic anemia - follow- Hgb 7-8 range    AKI (acute kidney injury) ([place]) - presented with Cr of 3.07- ? 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       Acute respiratory failure with hypoxia ([place])  - with acute pulmonary edema - EF 40-45% - required Bipap  - resolved   DVT prophylaxis:  SCDs Start: 10/05/21 0356     Code Status: Full Code  Consultants: ID, Urology Level of Care: Level of care: Med-Surg Disposition Plan:  Status is: Inpatient Remains inpatient  appropriate because: ongoing renal infection  Objective:   Vitals:   10/21/21 1633 10/21/21 2117 10/22/21 0654 10/22/21 0854  BP: (!) 112/58 (!) 104/50 113/62 (!) 99/48  Pulse: 81 88 76 82  Resp: '18  14 18  '$ Temp: 98.4 F (36.9 C) 99.4 F (37.4 C) 98.7 F (37.1 C)   TempSrc:  Oral    SpO2: 94% 96% 96% 93%  Weight:      Height:       Filed Weights   10/17/21 0531 10/19/21 0446 10/20/21 0553  Weight: 73.2 kg 74 kg 74.6 kg   Exam: General exam: Appears comfortable  HEENT: PERRLA, oral mucosa moist, no sclera icterus or thrush Respiratory system: Clear to auscultation. Respiratory effort normal. Cardiovascular system: S1 & S2 heard, regular rate and rhythm Gastrointestinal system: Abdomen soft, tender throughout left abdomen, nondistended. Normal bowel sounds   Central nervous system: Alert and oriented. No focal neurological deficits. Extremities: No cyanosis, clubbing or edema Skin: No rashes or ulcers Psychiatry:  Mood & affect appropriate.    Imaging and lab data was personally reviewed    CBC: Recent Labs  Lab 10/19/21 0153 10/20/21 0603 10/21/21 0258 10/22/21 0301 10/22/21 1106  WBC 14.6* 13.1* 13.1* 13.0* 13.4*  HGB 8.1* 7.7* 8.0* 6.8* 7.5*  HCT 24.0* 23.0* 24.0* 20.3* 23.0*  MCV 96.0 96.2 96.4 95.3 96.2  PLT 827* 791* 733* 682* 517*   Basic Metabolic Panel: Recent Labs  Lab 10/18/21 0352 10/19/21 0153 10/20/21  7654 10/21/21 0258 10/22/21 0301  NA 135 134* 134* 133* 135  K 4.8 4.5 4.4 4.5 4.4  CL 100 96* 99 93* 95*  CO2 '28 27 28 28 27  '$ GLUCOSE 151* 96 158* 199* 337*  BUN '13 12 13 14 15  '$ CREATININE 1.19* 1.12* 1.18* 1.06* 1.30*  CALCIUM 8.5* 8.6* 8.2* 8.7* 8.5*   GFR: Estimated Creatinine Clearance: 46.2 mL/min (A) (by C-G formula based on SCr of 1.3 mg/dL (H)).  Scheduled Meds:  sodium chloride   Intravenous Once   Chlorhexidine Gluconate Cloth  6 each Topical Daily   DULoxetine  60 mg Oral QHS   gabapentin  300 mg Oral TID   insulin aspart   0-15 Units Subcutaneous TID WC   insulin aspart  0-5 Units Subcutaneous QHS   insulin aspart  10 Units Subcutaneous TID WC   insulin glargine-yfgn  20 Units Subcutaneous BID   multivitamin with minerals  1 tablet Oral Daily   pantoprazole  40 mg Oral Daily   polyethylene glycol  17 g Oral BID   senna-docusate  1 tablet Oral BID   simvastatin  40 mg Oral QHS   sodium chloride flush  10-40 mL Intracatheter Q12H   sodium chloride flush  3 mL Intravenous Q12H   sorbitol, milk of mag, mineral oil, glycerin (SMOG) enema  960 mL Rectal Once   Continuous Infusions:  sodium chloride Stopped (10/05/21 0517)   cefTRIAXone (ROCEPHIN)  IV 2 g (10/21/21 1804)     LOS: 17 days   Author: Debbe Odea  10/22/2021 2:37 PM

## 2021-10-23 ENCOUNTER — Inpatient Hospital Stay (HOSPITAL_COMMUNITY): Payer: Commercial Managed Care - HMO

## 2021-10-23 DIAGNOSIS — R7881 Bacteremia: Secondary | ICD-10-CM | POA: Diagnosis not present

## 2021-10-23 DIAGNOSIS — E119 Type 2 diabetes mellitus without complications: Secondary | ICD-10-CM | POA: Diagnosis not present

## 2021-10-23 DIAGNOSIS — A419 Sepsis, unspecified organism: Secondary | ICD-10-CM | POA: Diagnosis not present

## 2021-10-23 DIAGNOSIS — N12 Tubulo-interstitial nephritis, not specified as acute or chronic: Secondary | ICD-10-CM | POA: Diagnosis not present

## 2021-10-23 LAB — PROTIME-INR
INR: 1.2 (ref 0.8–1.2)
Prothrombin Time: 14.8 seconds (ref 11.4–15.2)

## 2021-10-23 LAB — GLUCOSE, CAPILLARY
Glucose-Capillary: 149 mg/dL — ABNORMAL HIGH (ref 70–99)
Glucose-Capillary: 107 mg/dL — ABNORMAL HIGH (ref 70–99)
Glucose-Capillary: 136 mg/dL — ABNORMAL HIGH (ref 70–99)
Glucose-Capillary: 91 mg/dL (ref 70–99)

## 2021-10-23 MED ORDER — FENTANYL CITRATE (PF) 100 MCG/2ML IJ SOLN
INTRAMUSCULAR | Status: AC | PRN
  Administered 2021-10-23 (×2): 50 ug via INTRAVENOUS

## 2021-10-23 MED ORDER — MIDAZOLAM HCL 2 MG/2ML IJ SOLN
INTRAMUSCULAR | Status: AC | PRN
  Administered 2021-10-23 (×2): 1 mg via INTRAVENOUS

## 2021-10-23 MED ORDER — FENTANYL CITRATE (PF) 100 MCG/2ML IJ SOLN
INTRAMUSCULAR | Status: AC
  Filled 2021-10-23: qty 4

## 2021-10-23 MED ORDER — MIDAZOLAM HCL 2 MG/2ML IJ SOLN
INTRAMUSCULAR | Status: AC
  Filled 2021-10-23: qty 4

## 2021-10-23 MED ORDER — GELATIN ABSORBABLE 12-7 MM EX MISC
CUTANEOUS | Status: AC
  Filled 2021-10-23: qty 1

## 2021-10-23 MED ORDER — LIDOCAINE HCL 1 % IJ SOLN
INTRAMUSCULAR | Status: AC
  Filled 2021-10-23: qty 10

## 2021-10-23 NOTE — Plan of Care (Signed)
  Problem: Education: Goal: Individualized Educational Video(s) 10/23/2021 0815 by Signa Kell, RN Outcome: Progressing 10/23/2021 0814 by Signa Kell, RN Outcome: Progressing   Problem: Coping: Goal: Ability to adjust to condition or change in health will improve 10/23/2021 0815 by Signa Kell, RN Outcome: Progressing 10/23/2021 0814 by Signa Kell, RN Outcome: Progressing   Problem: Fluid Volume: Goal: Ability to maintain a balanced intake and output will improve 10/23/2021 0815 by Signa Kell, RN Outcome: Progressing 10/23/2021 0814 by Signa Kell, RN Outcome: Progressing   Problem: Health Behavior/Discharge Planning: Goal: Ability to identify and utilize available resources and services will improve 10/23/2021 0815 by Signa Kell, RN Outcome: Progressing 10/23/2021 0814 by Signa Kell, RN Outcome: Progressing Goal: Ability to manage health-related needs will improve 10/23/2021 0815 by Signa Kell, RN Outcome: Progressing 10/23/2021 0814 by Signa Kell, RN Outcome: Progressing   Problem: Metabolic: Goal: Ability to maintain appropriate glucose levels will improve Outcome: Progressing   Problem: Nutritional: Goal: Maintenance of adequate nutrition will improve 10/23/2021 0815 by Signa Kell, RN Outcome: Progressing 10/23/2021 0814 by Signa Kell, RN Outcome: Progressing Goal: Progress toward achieving an optimal weight will improve 10/23/2021 0815 by Signa Kell, RN Outcome: Progressing 10/23/2021 0814 by Signa Kell, RN Outcome: Progressing   Problem: Skin Integrity: Goal: Risk for impaired skin integrity will decrease 10/23/2021 0815 by Signa Kell, RN Outcome: Progressing 10/23/2021 0814 by Signa Kell, RN Outcome: Progressing   Problem: Tissue Perfusion: Goal: Adequacy of tissue perfusion will improve Outcome: Progressing   Problem: Fluid Volume: Goal: Hemodynamic stability will improve Outcome: Progressing   Problem: Clinical  Measurements: Goal: Diagnostic test results will improve Outcome: Progressing

## 2021-10-23 NOTE — Progress Notes (Signed)
PT Cancellation Note  Patient Details Name: [name] MRN: [id number] DOB: 12-28-1965   Cancelled Treatment:    Reason Eval/Treat Not Completed: Fatigue/lethargy limiting ability to participate  Patient returned from IR procedure and continues to be drowsy from sedation. She reports she does not feel safe trying to get up on her feet right now.   [place]  Office [phone]  Rexanne Mano 10/23/2021, 3:01 PM

## 2021-10-23 NOTE — Procedures (Signed)
Interventional Radiology Procedure Note  Procedure:  1.) CT guided aspiration of small abscess in lateral right kidney yielding 2 mL purulent fluid, sent for cx 2.) Placement of 36F drain into larger abscess in medial right kidney.   Complications: None  Estimated Blood Loss: None  Recommendations: - Drain to JP - Do not flush  - Cx sent   Signed,  Criselda Peaches, MD

## 2021-10-23 NOTE — Progress Notes (Signed)
[facility] Progress Note  Patient: [name]     RSW:[id number]  DOA: 10/04/2021   PCP: [facility]       Brief hospital course: This is a 56 year old with insulin requiring diabetes mellitus, hypertension, [name], history of STEMI who presented to the hospital for nausea vomiting and was found to be in septic shock related to emphysematous pyelonephritis.  She was started on ceftriaxone.  Eventually she was weaned off of vasopressors.  Urine and blood culture revealed E. coli. On 6/18, CT revealed gas collections in the right kidney On 6/26, she underwent a perinephric aspiration  Subjective:  Pain in abdomen and back unchanged from yesterday.   Assessment and Plan: Principal Problem:   Emphysematous pyelitis,  E coli pyelonephritis and bacteremia with Septic shock   -Currently on ceftriaxone - Repeat CT reveals areas of renal abscesses in the right and the left -Urology, Dr. Florence Canner does not feel that he can intervene on these areas - ID has recommended an IR consult to see if these areas can be aspirated- IR aspirated 2 cc of pus today-   Active Problems:   Insulin-requiring or dependent type II diabetes mellitus ([place]) -Semglee twice daily and NovoLog - Continue to follow sugars    Thrombocytopenia ([place]) - resolved- now has thrombocytosis  Normocytic anemia - follow- Hgb 7-8 range    AKI (acute kidney injury) ([place]) - presented with Cr of 3.07- ? 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       Acute respiratory failure with hypoxia ([place])  - with acute pulmonary edema - EF 40-45% - required Bipap  - resolved   DVT prophylaxis:  SCDs Start: 10/05/21 0356    Code Status: Full Code  Consultants: ID, Urology Level of Care:  Level of care: Med-Surg Disposition Plan:  Status is: Inpatient Remains inpatient appropriate because: ongoing renal infection  Objective:   Vitals:   10/23/21 1235 10/23/21 1240 10/23/21 1250 10/23/21 1318  BP: 112/65 106/63 109/66 (!) 99/54  Pulse: 75 80 72 69  Resp: '12 12 16 13  '$ Temp:    98.5 F (36.9 C)  TempSrc:      SpO2: 92% 92% 95% 94%  Weight:      Height:       Filed Weights   10/19/21 0446 10/20/21 0553 10/23/21 0500  Weight: 74 kg 74.6 kg 74.8 kg   Exam: General exam: Appears comfortable  HEENT: PERRLA, oral mucosa moist, no sclera icterus or thrush Respiratory system: Clear to auscultation. Respiratory effort normal. Cardiovascular system: S1 & S2 heard, regular rate and rhythm Gastrointestinal system: Abdomen soft,  tender on the right and b/l CVA tenderness, Normal bowel sounds   Central nervous system: Alert and oriented. No focal neurological deficits. Extremities: No cyanosis, clubbing or edema Skin: No rashes or ulcers Psychiatry:  Mood & affect appropriate.    Imaging and lab data was personally reviewed    CBC: Recent Labs  Lab 10/19/21 0153 10/20/21 0603 10/21/21 0258 10/22/21 0301 10/22/21 1106  WBC 14.6* 13.1* 13.1* 13.0* 13.4*  HGB 8.1* 7.7* 8.0* 6.8* 7.5*  HCT 24.0* 23.0* 24.0* 20.3* 23.0*  MCV 96.0 96.2 96.4 95.3 96.2  PLT 827* 791*  733* 682* 714*    Basic Metabolic Panel: Recent Labs  Lab 10/18/21 0352 10/19/21 0153 10/20/21 0603 10/21/21 0258 10/22/21 0301  NA 135 134* 134* 133* 135  K 4.8 4.5 4.4 4.5 4.4  CL 100 96* 99 93* 95*  CO2 '28 27 28 28 27  '$ GLUCOSE 151* 96 158* 199* 337*  BUN '13 12 13 14 15  '$ CREATININE 1.19* 1.12* 1.18* 1.06* 1.30*  CALCIUM 8.5* 8.6* 8.2* 8.7* 8.5*    GFR: Estimated Creatinine Clearance: 46.3 mL/min (A) (by C-G formula based on SCr of 1.3 mg/dL (H)).  Scheduled Meds:  sodium chloride   Intravenous Once   Chlorhexidine Gluconate Cloth  6 each Topical Daily   DULoxetine  60 mg Oral QHS    gabapentin  300 mg Oral TID   gelatin adsorbable       insulin aspart  0-15 Units Subcutaneous TID WC   insulin aspart  0-5 Units Subcutaneous QHS   insulin aspart  10 Units Subcutaneous TID WC   insulin glargine-yfgn  20 Units Subcutaneous BID   lidocaine       multivitamin with minerals  1 tablet Oral Daily   pantoprazole  40 mg Oral Daily   polyethylene glycol  17 g Oral BID   senna-docusate  1 tablet Oral BID   simvastatin  40 mg Oral QHS   sodium chloride flush  10-40 mL Intracatheter Q12H   sodium chloride flush  3 mL Intravenous Q12H   sorbitol, milk of mag, mineral oil, glycerin (SMOG) enema  960 mL Rectal Once   Continuous Infusions:  sodium chloride Stopped (10/05/21 0517)   cefTRIAXone (ROCEPHIN)  IV Stopped (10/22/21 1832)     LOS: 18 days   Author: Debbe Odea  10/23/2021 3:01 PM

## 2021-10-23 NOTE — Progress Notes (Signed)
PT Cancellation Note  Patient Details Name: [name] MRN: [id number] DOB: May 15, 1965   Cancelled Treatment:    Reason Eval/Treat Not Completed: Patient at procedure or test/unavailable  On arrival, pt being transported off the unit. Will reattempt as schedules permit   [place]  Office [phone]  Rexanne Mano 10/23/2021, 11:59 AM

## 2021-10-24 ENCOUNTER — Inpatient Hospital Stay (HOSPITAL_COMMUNITY): Payer: Commercial Managed Care - HMO

## 2021-10-24 DIAGNOSIS — B9620 Unspecified Escherichia coli [E. coli] as the cause of diseases classified elsewhere: Secondary | ICD-10-CM | POA: Diagnosis not present

## 2021-10-24 DIAGNOSIS — R7881 Bacteremia: Secondary | ICD-10-CM | POA: Diagnosis not present

## 2021-10-24 DIAGNOSIS — A419 Sepsis, unspecified organism: Secondary | ICD-10-CM | POA: Diagnosis not present

## 2021-10-24 DIAGNOSIS — E119 Type 2 diabetes mellitus without complications: Secondary | ICD-10-CM | POA: Diagnosis not present

## 2021-10-24 DIAGNOSIS — N12 Tubulo-interstitial nephritis, not specified as acute or chronic: Secondary | ICD-10-CM | POA: Diagnosis not present

## 2021-10-24 LAB — GLUCOSE, CAPILLARY
Glucose-Capillary: 98 mg/dL (ref 70–99)
Glucose-Capillary: 281 mg/dL — ABNORMAL HIGH (ref 70–99)
Glucose-Capillary: 308 mg/dL — ABNORMAL HIGH (ref 70–99)
Glucose-Capillary: 165 mg/dL — ABNORMAL HIGH (ref 70–99)
Glucose-Capillary: 155 mg/dL — ABNORMAL HIGH (ref 70–99)

## 2021-10-24 LAB — CBC
HCT: 25 % — ABNORMAL LOW (ref 36.0–46.0)
Hemoglobin: 7.9 g/dL — ABNORMAL LOW (ref 12.0–15.0)
MCH: 30.3 pg (ref 26.0–34.0)
MCHC: 31.6 g/dL (ref 30.0–36.0)
MCV: 95.8 fL (ref 80.0–100.0)
Platelets: 772 10*3/uL — ABNORMAL HIGH (ref 150–400)
RBC: 2.61 MIL/uL — ABNORMAL LOW (ref 3.87–5.11)
RDW: 14.7 % (ref 11.5–15.5)
WBC: 13.6 10*3/uL — ABNORMAL HIGH (ref 4.0–10.5)
nRBC: 0 % (ref 0.0–0.2)

## 2021-10-24 LAB — BASIC METABOLIC PANEL
Anion gap: 9 (ref 5–15)
BUN: 15 mg/dL (ref 6–20)
CO2: 30 mmol/L (ref 22–32)
Calcium: 8.7 mg/dL — ABNORMAL LOW (ref 8.9–10.3)
Chloride: 95 mmol/L — ABNORMAL LOW (ref 98–111)
Creatinine, Ser: 1.3 mg/dL — ABNORMAL HIGH (ref 0.44–1.00)
GFR, Estimated: 48 mL/min — ABNORMAL LOW (ref >60)
Glucose, Bld: 148 mg/dL — ABNORMAL HIGH (ref 70–99)
Potassium: 4.6 mmol/L (ref 3.5–5.1)
Sodium: 134 mmol/L — ABNORMAL LOW (ref 135–145)

## 2021-10-24 MED ORDER — POLYETHYLENE GLYCOL 3350 17 G PO PACK
17.0000 g | PACK | Freq: Every day | ORAL | Status: DC | PRN
  Filled 2021-10-24: qty 1

## 2021-10-24 MED ORDER — DEXTROSE 50 % IV SOLN: 1.0000 | Freq: Once | INTRAVENOUS | Status: DC

## 2021-10-24 NOTE — TOC Progression Note (Signed)
Transition of Care [facility]) - Progression Note    Patient Details  Name: [name] MRN: [id number] Date of Birth: [birth year]  Transition [facility]) CM/SW Contact  Tom-Johnson, Renea Ee, RN Phone Number: 10/24/2021, 3:41 PM  Clinical Narrative:     Patient had aspiration of small abscess in lateral right kidney yesterday, 10/24/21 with placement of 92F drain into larger abscess in medial right kidney. Continues on IV abx. CM will continue to follow with needs.   Expected Discharge Plan: [place] Barriers to Discharge: Continued Medical Work up  Expected Discharge Plan and Services Expected Discharge Plan: [place]   Discharge Planning Services: CM Consult Post Acute Care Choice: [place] arrangements for the past 2 months: Single Family Home                   DME Agency: NA       HH Arranged: PT, RN [place] Agency: [place] ([facility]) Date HH Agency Contacted: 10/10/21 Time [place]: 1034 Representative spoke with at [place]: Caryl Pina , will need orders   Social Determinants of Health (SDOH) Interventions    Readmission Risk Interventions     No data to display

## 2021-10-24 NOTE — Inpatient Diabetes Management (Addendum)
Inpatient Diabetes Program Recommendations  AACE/ADA: New Consensus Statement on Inpatient Glycemic Control (2015)  Target Ranges:  Prepandial:   less than 140 mg/dL      Peak postprandial:   less than 180 mg/dL (1-2 hours)      Critically ill patients:  140 - 180 mg/dL   Lab Results  Component Value Date   GLUCAP 308 (H) 10/24/2021   HGBA1C 12.5 (H) 10/05/2021    Review of Glycemic Control  Diabetes history: type 2 Outpatient Diabetes medications: Basaglar 30 units at HS, Metformin 1000 mg BID, Amaryl 4 mg daily Current orders for Inpatient glycemic control: Semglee 20 units BID, Novolog MODERATE correction scale TID & 0-5 units HS scale, Novolog 10 units TID  Inpatient Diabetes Program Recommendations:   Spoke with patient at the beside in regards to affordability of insulin. We discussed the benefit check that was done on 6/28. She states that it would be much better for cost to get the Relion [employer] 70/30 insulin pens for $42 for 5 pens. She has been on 70/30 in the past that she took before breakfast and before supper. She states that she would also like a prescription for insulin pen needles. Patient has a home blood glucose meter and strips, lancets.   See note on 6/28 from Inpatient diabetes coordinator who spoke to patient intially in regards to her insulin.   At discharge: [employer] Novolin Relion 70/30 Flexpen insulin pen (order # [id number]) Insulin pen needles 32 gauge X 4 mm ( order # [id number])  Will continue to monitor blood sugars while in the hospital.  Harvel Ricks RN BSN CDE Diabetes Coordinator Pager: [phone]  8am-5pm

## 2021-10-24 NOTE — Progress Notes (Addendum)
[facility] Progress Note  Patient: [name]     QQI:[id number]  DOA: 10/04/2021   [facility]       Brief hospital course: This is a 56 year old with insulin requiring diabetes mellitus, hypertension, [name], history of STEMI who presented to the hospital for nausea vomiting and was found to be in septic shock related to emphysematous pyelonephritis.  She was started on ceftriaxone.  Eventually she was weaned off of vasopressors.  Urine and blood culture revealed E. coli. On 6/18, CT revealed gas collections in the right kidney On 6/26, she underwent a perinephric aspiration  Subjective:  She complained of blurred vision & dizziness since she woke up.   Assessment and Plan: Principal Problem:   Emphysematous pyelitis,  E coli pyelonephritis and bacteremia with Septic shock   -Currently on ceftriaxone via mid line - Repeat CT revealed areas of renal abscesses in the right and the left -Urology, Dr. Florence Canner does not feel that he can intervene on these areas - ID has recommended an IR consult to see if these areas can be aspirated- IR aspirated 2 cc of pus on 7/6 and placed a drain - 15 cc out overnight- abundant WBC and no organisms seen on gram stain  Active Problems: Blurred vision, dizziness, weakness of left side - sudden onset blurred vision and dizziness this AM-admits that dizziness feels like everything is spinning- she had no new meds overnight- on exam left arm is slightly weaker than right  - stat glucose normal - stat MRI ordered- addendum- MRI negative  - CBC and Bmet ordered- has not yet been drawn    Insulin-requiring or dependent type II diabetes mellitus ([place]) -Semglee twice daily and NovoLog - Continue to follow sugars    Thrombocytopenia ([place]) - resolved- now has thrombocytosis (likely from infection)  Normocytic anemia - follow- Hgb 7-8 range    AKI (acute kidney injury) ([place]) - presented with Cr of 3.07- ?  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       Acute respiratory failure with hypoxia ([place])  - with acute pulmonary edema - EF 40-45% - required Bipap  - resolved   DVT prophylaxis:  SCDs Start: 10/05/21 0356    Code Status: Full Code  Consultants: ID, Urology Level of Care: Level of care: Med-Surg Disposition Plan:  Status is: Inpatient Remains inpatient appropriate because: ongoing renal infection- neuro symptoms  Objective:   Vitals:   10/23/21 2057 10/24/21 0416 10/24/21 0448 10/24/21 0931  BP: (!) 109/57 (!) 97/49  (!) 94/49  Pulse: 80 87  80  Resp: '18 18  19  '$ Temp: 99.1 F (37.3 C) 99.4 F (37.4 C)  98.8 F (37.1 C)  TempSrc:    Oral  SpO2: 90% 90%  95%  Weight:   74.8 kg   Height:       Filed Weights   10/20/21 0553 10/23/21 0500 10/24/21 0448  Weight: 74.6 kg 74.8 kg 74.8 kg   Exam: General exam: Appears comfortable  HEENT: PERRLA, oral mucosa moist, no sclera icterus or thrush Respiratory system: Clear to auscultation. Respiratory effort normal. Cardiovascular system: S1 & S2 heard, regular rate and rhythm Gastrointestinal system/ flank: Abdomen soft,  non-tender, nondistended. Normal bowel sounds  - tender in right abdomen and b/l flanks- drain present in right flank Central nervous system: Alert and oriented. Cannot read most of her menu, left arm 4/5 weakness.  Extremities: No cyanosis, clubbing or edema Skin: No rashes or ulcers Psychiatry:  Mood & affect appropriate.    Imaging and lab data was personally reviewed    CBC: Recent Labs  Lab 10/19/21 0153 10/20/21 0603 10/21/21 0258 10/22/21 0301 10/22/21 1106  WBC 14.6* 13.1* 13.1* 13.0* 13.4*  HGB 8.1* 7.7* 8.0* 6.8* 7.5*  HCT 24.0* 23.0* 24.0* 20.3* 23.0*  MCV 96.0 96.2 96.4 95.3 96.2   PLT 827* 791* 733* 682* 714*    Basic Metabolic Panel: Recent Labs  Lab 10/18/21 0352 10/19/21 0153 10/20/21 0603 10/21/21 0258 10/22/21 0301  NA 135 134* 134* 133* 135  K 4.8 4.5 4.4 4.5 4.4  CL 100 96* 99 93* 95*  CO2 '28 27 28 28 27  '$ GLUCOSE 151* 96 158* 199* 337*  BUN '13 12 13 14 15  '$ CREATININE 1.19* 1.12* 1.18* 1.06* 1.30*  CALCIUM 8.5* 8.6* 8.2* 8.7* 8.5*    GFR: Estimated Creatinine Clearance: 46.3 mL/min (A) (by C-G formula based on SCr of 1.3 mg/dL (H)).  Scheduled Meds:  sodium chloride   Intravenous Once   Chlorhexidine Gluconate Cloth  6 each Topical Daily   dextrose  1 ampule Intravenous Once   DULoxetine  60 mg Oral QHS   gabapentin  300 mg Oral TID   insulin aspart  0-15 Units Subcutaneous TID WC   insulin aspart  0-5 Units Subcutaneous QHS   insulin aspart  10 Units Subcutaneous TID WC   insulin glargine-yfgn  20 Units Subcutaneous BID   multivitamin with minerals  1 tablet Oral Daily   pantoprazole  40 mg Oral Daily   senna-docusate  1 tablet Oral BID   simvastatin  40 mg Oral QHS   sodium chloride flush  10-40 mL Intracatheter Q12H   sodium chloride flush  3 mL Intravenous Q12H   Continuous Infusions:  sodium chloride Stopped (10/05/21 0517)   cefTRIAXone (ROCEPHIN)  IV 2 g (10/23/21 1659)     LOS: 19 days   Author: Debbe Odea  10/24/2021 2:44 PM

## 2021-10-24 NOTE — Progress Notes (Signed)
Referring Physician(s): Dr. 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   Subjective: Patient in bed, she states she doesn't feel much better since the drain was placed.   Allergies: Aspartame and phenylalanine, Toradol [ketorolac tromethamine], Sucralose, Codeine, Dilaudid [hydromorphone hcl], Hydrocodone, and Oxycodone  Medications: Prior to Admission medications   Medication Sig Start Date End Date Taking? Authorizing Provider  albuterol (VENTOLIN HFA) 108 (90 Base) MCG/ACT inhaler Inhale 2 puffs into the lungs every 4 (four) hours as needed for shortness of breath or wheezing. 05/27/21  Yes [provider]  Cholecalciferol (VITAMIN D3) 1.25 MG (50000 UT) CAPS Take 50,000 Units by mouth every Sunday. 10/02/21  Yes [provider]  DULoxetine (CYMBALTA) 60 MG capsule Take 60 mg by mouth at bedtime. 09/16/21  Yes [provider]  famotidine (PEPCID) 20 MG tablet Take 20-40 mg by mouth daily as needed for heartburn or indigestion.   Yes [provider]  gabapentin (NEURONTIN) 600 MG tablet Take 1,200 mg by mouth at bedtime. 08/12/21  Yes [provider]  glimepiride (AMARYL) 4 MG tablet Take 4 mg by mouth daily. 10/02/21  Yes [provider]  HYDROcodone-acetaminophen (NORCO) 7.5-325 MG tablet Take 1 tablet by mouth every 6 (six) hours as needed for moderate pain.   Yes [provider]  ibuprofen (ADVIL) 200 MG tablet Take 800 mg by mouth every 6 (six) hours as needed for mild pain.   Yes [provider]  ibuprofen (ADVIL) 800 MG tablet Take 800 mg by mouth every 8 (eight) hours as needed for mild pain.   Yes [provider]  Insulin Glargine (BASAGLAR KWIKPEN) 100 UNIT/ML Inject 30 Units into the skin at bedtime.   Yes [provider]  lisinopril (ZESTRIL)  10 MG tablet Take 10 mg by mouth daily. 09/16/21  Yes [provider]  metFORMIN (GLUCOPHAGE) 1000 MG tablet Take 1,000 mg by mouth 2 (two) times daily. 09/07/21  Yes [provider]  Probiotic Product (PROBIOTIC PO) Take 1 capsule by mouth daily.   Yes [provider]  simvastatin (ZOCOR) 40 MG tablet Take 40 mg by mouth at bedtime. 09/14/21  Yes [provider]     Vital Signs: BP (!) 94/49   Pulse 80   Temp 98.8 F (37.1 C) (Oral)   Resp 19   Ht 5' 2.5" (1.588 m)   Wt 164 lb 14.5 oz (74.8 kg)   SpO2 95%   BMI 29.68 kg/m   Physical Exam Constitutional:      General: She is not in acute distress. Pulmonary:     Effort: Pulmonary effort is normal.  Abdominal:     Comments: Right flank/lower back drain to suction. Approximately 20 ml of purulent material in bulb. Dressing is clean/dry. Site is tender to palpation.   Skin:    General: Skin is warm and dry.  Neurological:     Mental Status: She is alert and oriented to person, place, and time.     Imaging: CT IMAGE GUIDED DRAINAGE BY PERCUTANEOUS CATHETER  Result Date: 10/23/2021 INDICATION: Multifocal renal abscesses EXAM: CT GUIDED DRAINAGE OF RIGHT RENAL ABSCESS CT GUIDED ASPIRATION OF RIGHT RENAL ABSCESS TECHNIQUE: Multidetector CT imaging of the abdomen was performed following the standard protocol without IV contrast. RADIATION DOSE REDUCTION: This exam was performed according to the departmental dose-optimization program which includes  automated exposure control, adjustment of the mA and/or kV according to patient size and/or use of iterative reconstruction technique. MEDICATIONS: The patient is currently admitted to the hospital and receiving intravenous antibiotics. The antibiotics were administered within an appropriate time frame prior to the initiation of the procedure. ANESTHESIA/SEDATION: Moderate (conscious) sedation was employed during this procedure. A total of Versed 2 mg and Fentanyl  100 mcg was administered intravenously by the radiology nurse. Total intra-service moderate Sedation Time: 20 minutes. The patient's level of consciousness and vital signs were monitored continuously by radiology nursing throughout the procedure under my direct supervision. COMPLICATIONS: None immediate. TECHNIQUE: Informed written consent was obtained from the patient after a thorough discussion of the procedural risks, benefits and alternatives. All questions were addressed. Maximal Sterile Barrier Technique was utilized including caps, mask, sterile gowns, sterile gloves, sterile drape, hand hygiene and skin antiseptic. A timeout was performed prior to the initiation of the procedure. PROCEDURE: The operative field was prepped with Chlorhexidine in a sterile fashion, and a sterile drape was applied covering the operative field. A sterile gown and sterile gloves were used for the procedure. Local anesthesia was provided with 1% Lidocaine. ASPIRATION The smaller abscess at the lateral margin of the right kidney was identified and an appropriate skin entry site selected and marked. Local anesthesia was attained by infiltration with 1% lidocaine. Under intermittent CT guidance, a 22 gauge spinal needle was carefully advanced into the fluid collection. Aspiration was then performed yielding approximately 2 mL of purulent fluid. This was sent for Gram stain and culture. The needle was removed. DRAIN PLACEMENT The larger presumed abscess at the medial margin of the right kidney was identified an appropriate skin entry site selected and marked. Local anesthesia was attained by infiltration with 1% lidocaine. Under intermittent CT guidance, an 18 gauge spinal needle was carefully advanced into the fluid collection. A 0.035 wire was then coiled in the fluid collection. The percutaneous tract was dilated to 10 [place]. A Cook 10 [place] all-purpose drainage catheter was advanced over the wire and formed. Aspiration yields  approximately 7 mL of thick purulent fluid. This was not sent for culture as this particular abscess had previously been aspirated and cultured with a positive diagnosis of E coli. The drain was connected to JP bulb suction and secured to the skin with 0 Prolene suture. FINDINGS: Lateral right renal fluid collection yielding 2 mL purulent fluid. Medial right renal fluid collection yielding 7 mL purulent fluid. IMPRESSION: 1. Successful aspiration of more lateral and smaller right renal abscess. Sample sent for culture. 2. Successful placement of 10 French drain into the medial right renal abscess. PLAN: Renal abscess drain to JP bulb suction. Do not flush drainage catheter. Electronically Signed   By: Jacqulynn Cadet M.D.   On: 10/23/2021 13:45   CT ABDOMEN PELVIS W CONTRAST  Result Date: 10/21/2021 CLINICAL DATA:  Severe pyelonephritis. Status post CT-guided right perinephric abscess aspiration. EXAM: CT ABDOMEN AND PELVIS WITH CONTRAST TECHNIQUE: Multidetector CT imaging of the abdomen and pelvis was performed using the standard protocol following bolus administration of intravenous contrast. RADIATION DOSE REDUCTION: This exam was performed according to the departmental dose-optimization program which includes automated exposure control, adjustment of the mA and/or kV according to patient size and/or use of iterative reconstruction technique. CONTRAST:  138m OMNIPAQUE IOHEXOL 300 MG/ML  SOLN COMPARISON:  10/11/2021 FINDINGS: Lower chest: Small to moderate right pleural effusion with overlying atelectasis is mildly increased in volume from previous exam. Subpleural atelectasis noted  in the left base. Hepatobiliary: No focal liver abnormality is seen. No gallstones, gallbladder wall thickening, or biliary dilatation. Pancreas: Unremarkable. No pancreatic ductal dilatation or surrounding inflammatory changes. Spleen: Status post splenectomy. Adrenals/Urinary Tract: Normal adrenal glands. Right-sided  perinephric fat stranding is again noted. Multiple areas of fluid attenuation within the right kidney are again identified. Compared with the previous exam there is been interval resolution gas within these fluid density areas. The largest arises off the medial aspect of the upper pole of right kidney measuring 3.9 x 3.5 cm, image 27/3. Formally this measured 4.1 x 2.9 cm. Fluid collection along the lateral aspect of the upper pole of right kidney measures 2.8 x 1.7 cm, image 27/3. Formally 2.8 x 2.0 cm. Fluid collection overlying the upper pole of the right kidney measures 2.9 by 1.9 cm, image 93/6. Previously 2.4 x 1.6 cm. Additional smaller areas of fluid density within the right kidney are unchanged. Striated nephrographic appearance of the left kidney is noted. Large area of lobar nephronia arising off the lateral cortex of the interpolar left kidney is improved in the interval, image 27/3. Similar appearance of right-sided pelvocaliectasis. No left-sided hydronephrosis. Urinary bladder is decompressed around a Foley catheter balloon. Stomach/Bowel: Stomach appears normal. The appendix is visualized and appears normal. No bowel wall thickening, inflammation, or distension. Vascular/Lymphatic: Aortic atherosclerosis. 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 Multiple prominent upper abdominal lymph nodes are identified likely reflecting reactive adenopathy. Reproductive: Status post hysterectomy. No adnexal masses. Other: No free fluid.  No signs of pneumoperitoneum. Musculoskeletal: Diffuse body wall edema is favored to represent anasarca. No acute or suspicious osseous findings. Degenerative disc disease noted at L5-S1. IMPRESSION: 1. 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 There is been interval resolution of gas within the right kidney fluid collections. Volume of fluid collections appear  stable in the interval. Progressive changes identified. 2. Left-sided pyelonephritis is again noted. Dominant area of lobar nephronia involving the left kidney has improved in the interval. 3. Small to moderate right pleural effusion with overlying atelectasis is mildly increased in volume from previous exam. 4. 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 5. Diffuse body wall edema is favored to represent anasarca. 6. Aortic Atherosclerosis (ICD10-I70.0). Electronically Signed   By: Kerby Moors M.D.   On: 10/21/2021 09:42    Labs:  CBC: Recent Labs    10/20/21 0603 10/21/21 0258 10/22/21 0301 10/22/21 1106  WBC 13.1* 13.1* 13.0* 13.4*  HGB 7.7* 8.0* 6.8* 7.5*  HCT 23.0* 24.0* 20.3* 23.0*  PLT 791* 733* 682* 714*    COAGS: Recent Labs    10/04/21 2241 10/06/21 1808 10/07/21 0611 10/23/21 0808  INR 1.2 1.2 1.1 1.2  APTT  --  28  --   --     BMP: Recent Labs    10/19/21 0153 10/20/21 0603 10/21/21 0258 10/22/21 0301  NA 134* 134* 133* 135  K 4.5 4.4 4.5 4.4  CL 96* 99 93* 95*  CO2 '27 28 28 27  '$ GLUCOSE 96 158* 199* 337*  BUN '12 13 14 15  '$ CALCIUM 8.6* 8.2* 8.7* 8.5*  CREATININE 1.12* 1.18* 1.06* 1.30*  GFRNONAA 58* 54* >60 48*    LIVER FUNCTION TESTS: Recent Labs    10/19/21 0153 10/20/21 0603 10/21/21 0258 10/22/21 0301  BILITOT 0.3 0.4 0.2* 0.4  AST 54* 61* 51* 39  ALT 47* 52* 52* 45*  ALKPHOS 150*  151* 153* 139*  PROT 6.8 6.1* 6.3* 5.8*  ALBUMIN 2.0* 1.7* 2.1* 1.8*    Assessment and Plan:  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 Laurence Ferrari.   Drain Location: right flank Size: Fr size: 10 Fr Date of placement: 10/23/21  Currently to: Drain collection device: suction bulb 24 hour output:  Output by Drain (mL) 10/22/21 0700 - 10/22/21 1459 10/22/21 1500 - 10/22/21 2259 10/22/21 2300 - 10/23/21 0659 10/23/21 0700 - 10/23/21 1459 10/23/21 1500 - 10/23/21  2259 10/23/21 2300 - 10/24/21 0659 10/24/21 0700 - 10/24/21 1459 10/24/21 1500 - 10/24/21 1603  Closed System Drain 1 Right Back Bulb (JP) 10 Fr.    10 5       Interval imaging/drain manipulation:  None  Current examination: 20 ml of purulent fluid in bulb.  Dressed appropriately.   Plan: Continue TID flushes with 5 cc NS. Record output Q shift. Dressing changes QD or PRN if soiled.  Call IR APP or on call IR MD if difficulty flushing or sudden change in drain output.  Repeat imaging/possible drain injection once output < 10 mL/QD (excluding flush material). Consideration for drain removal if output is < 10 mL/QD (excluding flush material), pending discussion with the providing surgical service.  Discharge planning: Please contact IR APP or on call IR MD prior to patient d/c to ensure appropriate follow up plans are in place. Typically patient will follow up with IR clinic 10-14 days post d/c for repeat imaging/possible drain injection. IR scheduler will contact patient with date/time of appointment. Patient will need to flush drain QD with 5 cc NS, record output QD, dressing changes every 2-3 days or earlier if soiled.   IR will continue to follow - please call with questions or concerns.  Electronically Signed: Soyla Dryer, AGACNP-BC [phone] 10/24/2021, 4:00 PM   I spent a total of 15 Minutes at the the patient's bedside AND on the patient's hospital floor or unit, greater than 50% of which was counseling/coordinating care for right renal abscess drain.

## 2021-10-24 NOTE — Progress Notes (Signed)
Physical Therapy Treatment Patient Details Name: [name] MRN: [id number] DOB: 1965/07/07 Today's Date: 10/24/2021   History of Present Illness Pt is a 56 y.o. female admitted 10/04/21 with c/o SOB, chest pain, N/V/D, loss of appetite. 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 S/p R perinephric aspiration 6/26, 7/7. PMH includes CAD, DM, fibromyalgia, neuropathy, ischemic cardiomyopathy, sinus bradycardia, NASH.    PT Comments    Pt asleep on entry, wakes easily and agreeable to therapy, however reports she has ongoing dizziness and blurred vision. Pt is supervision for bed mobility and transfers, and light min A for steadying with ambulation using RW. Pt frustrated that she is not able to walk further but is so tired requests to return to room. D/c plan remains appropriate at this time. PT will continue to follow acutely.     Recommendations for follow up therapy are one component of a multi-disciplinary discharge planning process, led by the attending physician.  Recommendations may be updated based on patient status, additional functional criteria and insurance authorization.  Follow Up Recommendations  Home health PT     Assistance Recommended at Discharge Intermittent Supervision/Assistance  Patient can return home with the following A little help with walking and/or transfers;A little help with bathing/dressing/bathroom;Assistance with cooking/housework;Assist for transportation;Help with stairs or ramp for entrance   Equipment Recommendations  Rolling walker (2 wheels)    Recommendations for Other Services OT consult     Precautions / Restrictions Precautions Precautions: Fall;Other (comment) Precaution Comments: JP bulb drain Restrictions Weight Bearing Restrictions: No     Mobility  Bed Mobility Overal bed mobility: Needs Assistance Bed Mobility: Sit to Supine      Supine to sit: Supervision     General bed mobility comments: supervision for safety    Transfers Overall transfer level: Needs assistance Equipment used: Rolling walker (2 wheels) Transfers: Sit to/from Stand Sit to Stand: Supervision           General transfer comment: No assist needed, increased time due to pain in abdomen    Ambulation/Gait Ambulation/Gait assistance: Min assist Gait Distance (Feet): 450 Feet Assistive device: Rolling walker (2 wheels) Gait Pattern/deviations: Step-through pattern, Decreased stride length, Trunk flexed Gait velocity: Decreased Gait velocity interpretation: <1.8 ft/sec, indicate of risk for recurrent falls Pre-gait activities: light min A for increased dizziness and blurred vision General Gait Details: Slow, fatigued gait with RW and intermittent min guard for balance; continued gait trial at end of ambulation without DME, pt reliant on HHA and frequently reaching to furniture/hallway rail for added stability, intermittent minA for balance         Balance Overall balance assessment: Needs assistance Sitting-balance support: No upper extremity supported, Feet unsupported Sitting balance-Leahy Scale: Good     Standing balance support: During functional activity, Reliant on assistive device for balance Standing balance-Leahy Scale: Poor Standing balance comment: requires UE support for balance                            Cognition Arousal/Alertness: Awake/alert Behavior During Therapy: Flat affect Overall Cognitive Status: Within Functional Limits for tasks assessed                                             General Comments General comments (skin integrity, edema, etc.):  Pt with c/o of increased dizziness, not affected by positional change, also reports blurry vision and inability to read menu      Pertinent Vitals/Pain Pain Assessment Pain Assessment: Faces Faces Pain Scale: Hurts a little  bit Pain Location: back, Pain Descriptors / Indicators: Sore, Tiring Pain Intervention(s): Limited activity within patient's tolerance, Monitored during session, Repositioned     PT Goals (current goals can now be found in the care plan section) Acute Rehab PT Goals Patient Stated Goal: return home PT Goal Formulation: With patient/family Time For Goal Achievement: 10/23/21 Potential to Achieve Goals: Good Progress towards PT goals: Progressing toward goals    Frequency    Min 3X/week      PT Plan Current plan remains appropriate       AM-PAC PT "6 Clicks" Mobility   Outcome Measure  Help needed turning from your back to your side while in a flat bed without using bedrails?: None Help needed moving from lying on your back to sitting on the side of a flat bed without using bedrails?: A Little Help needed moving to and from a bed to a chair (including a wheelchair)?: A Little Help needed standing up from a chair using your arms (e.g., wheelchair or bedside chair)?: A Little Help needed to walk in hospital room?: A Little Help needed climbing 3-5 steps with a railing? : A Little 6 Click Score: 19    End of Session Equipment Utilized During Treatment: Gait belt Activity Tolerance: Treatment limited secondary to medical complications (Comment);Patient limited by fatigue (dizziness and blurred vision) Patient left: with call bell/phone within reach;in bed Nurse Communication: Mobility status (dizziness and blurred vision) PT Visit Diagnosis: Other abnormalities of gait and mobility (R26.89);Muscle weakness (generalized) (M62.81);Pain Pain - part of body:  (back)     Time: 5784-6962 PT Time Calculation (min) (ACUTE ONLY): 17 min  Charges:  $Therapeutic Exercise: 8-22 mins                     Mart Colpitts B. Migdalia Dk PT, DPT Acute Rehabilitation Services Please use secure chat or  Call Office [phone]    [place] 10/24/2021, 1:20 PM

## 2021-10-25 DIAGNOSIS — R7881 Bacteremia: Secondary | ICD-10-CM | POA: Diagnosis not present

## 2021-10-25 DIAGNOSIS — D72829 Elevated white blood cell count, unspecified: Secondary | ICD-10-CM | POA: Diagnosis not present

## 2021-10-25 DIAGNOSIS — N12 Tubulo-interstitial nephritis, not specified as acute or chronic: Secondary | ICD-10-CM | POA: Diagnosis not present

## 2021-10-25 DIAGNOSIS — E119 Type 2 diabetes mellitus without complications: Secondary | ICD-10-CM | POA: Diagnosis not present

## 2021-10-25 LAB — GLUCOSE, CAPILLARY
Glucose-Capillary: 173 mg/dL — ABNORMAL HIGH (ref 70–99)
Glucose-Capillary: 174 mg/dL — ABNORMAL HIGH (ref 70–99)
Glucose-Capillary: 152 mg/dL — ABNORMAL HIGH (ref 70–99)
Glucose-Capillary: 180 mg/dL — ABNORMAL HIGH (ref 70–99)

## 2021-10-25 NOTE — Progress Notes (Signed)
[facility]  Date of Admission:  10/04/2021   Total days of inpatient antibiotics 16  Principal Problem:   Emphysematous pyelitis Active Problems:   Insulin-requiring or dependent type II diabetes mellitus (HCC)   Leukocytosis   CAD (coronary artery disease)   Hypotension   Thrombocytopenia (HCC)   AKI (acute kidney injury) ([place])   Elevated troponin   Acidosis   Cirrhosis (HCC)   Septic shock (HCC)   Acute respiratory failure with hypoxia (HCC)   E coli bacteremia          Assessment: 56 year old female with CAD, diabetes, NASH cirrhosis, C. 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 coli bacteremia due to emphysematous right-sided pyelonephritis this   #E. coli bacteremia 2/2 pyelonephritis #Emphysematous right-sided pyelonephritis  status post IR PERC drain placement. #Leukocytosis-trending down - Unable to leave drain in, only 7 cc of purulent fluid.. Cx + Ecoli - Patient reports abdominal pain has worsened today as compared to last couple of days -CT on 6/26 showed small perirenal fluid collection with gas. Pt had worsened left flank pain. Repeat CT showed multifocal  fluid attenuating structures in rt kidney c/w renal abscesses 2/2 emphysematous cystitis.  -She continues to have rt sided pain on day 19 of antibiotics without resolution of abscesses. Spoke with  -Urology reccommended IR engagement for possible aspiration -IR placed drain via CT guided aspiration noted 59m purulent fluid sent for Cx Recommendations: -Continue ceftriaxone while inpatient -Transition to cefadroxil '500mg'$  PO bid on discharge to complete 3 weeks of antibiotics from drain placement EOT 7/26.  -Follow-up in ID clinic on 7/21 with myself. Will need to monitor radiographic and clinical progression prior to stopping antibiotics.  -Follow 7/6 drain Cx -ID will  sign off   Microbiology:   Antibiotics: Ceftriaxone 6/17-p Cultures: Blood 6/17 2/2 ecoli Urine 6/17 ecoli Other   SUBJECTIVE: Resting in bed. Pain is improving Interval: Afebrile overnight Review of Systems: Review of Systems  All other systems reviewed and are negative.    Scheduled Meds:  sodium chloride   Intravenous Once   Chlorhexidine Gluconate Cloth  6 each Topical Daily   dextrose  1 ampule Intravenous Once   DULoxetine  60 mg Oral QHS   gabapentin  300 mg Oral TID   insulin aspart  0-15 Units Subcutaneous TID WC   insulin aspart  0-5 Units Subcutaneous QHS   insulin aspart  10 Units Subcutaneous TID WC   insulin glargine-yfgn  20 Units Subcutaneous BID   multivitamin with minerals  1 tablet Oral Daily   pantoprazole  40 mg Oral Daily   senna-docusate  1 tablet Oral BID   simvastatin  40 mg Oral QHS   sodium chloride flush  10-40 mL Intracatheter Q12H   sodium chloride flush  3 mL Intravenous Q12H   Continuous Infusions:  sodium chloride Stopped (10/05/21 0517)   cefTRIAXone (ROCEPHIN)  IV 2 g (10/24/21 1744)   PRN Meds:.acetaminophen **OR** acetaminophen, lip balm, ondansetron **OR** ondansetron (ZOFRAN) IV, mouth rinse, polyethylene glycol, sodium chloride flush, traMADol Allergies  Allergen Reactions   Aspartame And Phenylalanine Anaphylaxis, Shortness Of Breath and Swelling    Throat swells  ANY ARTIFICAL SWEETNERS   Toradol [Ketorolac Tromethamine] Other (See Comments)    Face & neck flushed red & pt felt very hot after IV adm.   Sucralose Rash    Pt.s  face broke out in rash after drinking Breeza.  Pt. Claims it is from the artificial sweetener.      Codeine Swelling   Dilaudid [Hydromorphone Hcl] Itching   Hydrocodone Itching   Oxycodone Itching    OBJECTIVE: Vitals:   10/24/21 0448 10/24/21 0931 10/24/21 1646 10/24/21 2017  BP:  (!) 94/49 (!) 97/42 (!) 110/45  Pulse:  80 79 80  Resp:  19 19   Temp:  98.8 F (37.1 C) 98.2 F (36.8 C)  99 F (37.2 C)  TempSrc:  Oral  Oral  SpO2:  95% 96% 93%  Weight: 74.8 kg     Height:       Body mass index is 29.68 kg/m.  Physical Exam Constitutional:      Appearance: Normal appearance.  HENT:     Head: Normocephalic and atraumatic.     Right Ear: Tympanic membrane normal.     Left Ear: Tympanic membrane normal.     Nose: Nose normal.     Mouth/Throat:     Mouth: Mucous membranes are moist.  Eyes:     Extraocular Movements: Extraocular movements intact.     Conjunctiva/sclera: Conjunctivae normal.     Pupils: Pupils are equal, round, and reactive to light.  Cardiovascular:     Rate and Rhythm: Normal rate and regular rhythm.     Heart sounds: No murmur heard.    No friction rub. No gallop.  Pulmonary:     Effort: Pulmonary effort is normal.     Breath sounds: Normal breath sounds.  Abdominal:     General: Abdomen is flat.     Palpations: Abdomen is soft.     Comments: Rt drain  Musculoskeletal:        General: Normal range of motion.  Skin:    General: Skin is warm and dry.  Neurological:     General: No focal deficit present.     Mental Status: She is alert and oriented to person, place, and time.  Psychiatric:        Mood and Affect: Mood normal.       Lab Results Lab Results  Component Value Date   WBC 13.6 (H) 10/24/2021   HGB 7.9 (L) 10/24/2021   HCT 25.0 (L) 10/24/2021   MCV 95.8 10/24/2021   PLT 772 (H) 10/24/2021    Lab Results  Component Value Date   CREATININE 1.30 (H) 10/24/2021   BUN 15 10/24/2021   NA 134 (L) 10/24/2021   K 4.6 10/24/2021   CL 95 (L) 10/24/2021   CO2 30 10/24/2021    Lab Results  Component Value Date   ALT 45 (H) 10/22/2021   AST 39 10/22/2021   ALKPHOS 139 (H) 10/22/2021   BILITOT 0.4 10/22/2021        Laurice Record, [facility] 10/25/2021, 1:07 AM

## 2021-10-25 NOTE — Progress Notes (Signed)
Patient ID: [name], female   DOB: 1965-10-22, 56 y.o.   MRN: [id number]    Subjective: S/P perc aspiration and drainage of two left abscess collections on 7/6.  Feeling about the same.  Objective: Vital signs in last 24 hours: Temp:  [98.2 F (36.8 C)-99 F (37.2 C)] 98.7 F (37.1 C) (07/08 0829) Pulse Rate:  [79-83] 80 (07/08 0829) Resp:  [18-19] 18 (07/08 0829) BP: (94-110)/(42-56) 107/56 (07/08 0829) SpO2:  [89 %-96 %] 95 % (07/08 0829) Weight:  [76.5 kg] 76.5 kg (07/08 0500)  Intake/Output from previous day: 07/07 0701 - 07/08 0700 In: 1080 [P.O.:1080] Out: 25 [Drains:25] Intake/Output this shift: No intake/output data recorded.  Physical Exam:  General: Alert and oriented Abd: Left drain with small amount of purulent fluid  Lab Results: Recent Labs    10/22/21 1106 10/24/21 1738  HGB 7.5* 7.9*  HCT 23.0* 25.0*      Latest Ref Rng & Units 10/24/2021    5:38 PM 10/22/2021   11:06 AM 10/22/2021    3:01 AM  CBC  WBC 4.0 - 10.5 K/uL 13.6  13.4  13.0   Hemoglobin 12.0 - 15.0 g/dL 7.9  7.5  6.8   Hematocrit 36.0 - 46.0 % 25.0  23.0  20.3   Platelets 150 - 400 K/uL 772  714  682      BMET Recent Labs    10/24/21 1738  NA 134*  K 4.6  CL 95*  CO2 30  GLUCOSE 148*  BUN 15  CREATININE 1.30*  CALCIUM 8.7*     Studies/Results: MR BRAIN WO CONTRAST  Result Date: 10/24/2021 CLINICAL DATA:  Dizziness, persistent/recurrent, cardiac or vascular cause suspected left arm weakness, dizziness, blurred vision EXAM: MRI HEAD WITHOUT CONTRAST TECHNIQUE: Multiplanar, multiecho pulse sequences of the brain and surrounding structures were obtained without intravenous contrast. COMPARISON:  2014 FINDINGS: Brain: There is no acute infarction or intracranial hemorrhage. There is no intracranial mass, mass effect, or edema. There is no hydrocephalus or extra-axial fluid collection. Ventricles and sulci are normal in size and configuration. Vascular: Major vessel flow voids at  the skull base are preserved. Skull and upper cervical spine: Normal marrow signal is preserved. Sinuses/Orbits: Paranasal sinuses are aerated. Orbits are unremarkable. Other: Sella is unremarkable. Patchy bilateral mastoid fluid opacification. IMPRESSION: No acute infarction, hemorrhage, or mass. Electronically Signed   By: Macy Mis M.D.   On: 10/24/2021 16:24   CT IMAGE GUIDED DRAINAGE BY PERCUTANEOUS CATHETER  Result Date: 10/23/2021 INDICATION: Multifocal renal abscesses EXAM: CT GUIDED DRAINAGE OF RIGHT RENAL ABSCESS CT GUIDED ASPIRATION OF RIGHT RENAL ABSCESS TECHNIQUE: Multidetector CT imaging of the abdomen was performed following the standard protocol without IV contrast. RADIATION DOSE REDUCTION: This exam was performed according to the departmental dose-optimization program which includes automated exposure control, adjustment of the mA and/or kV according to patient size and/or use of iterative reconstruction technique. MEDICATIONS: The patient is currently admitted to the hospital and receiving intravenous antibiotics. The antibiotics were administered within an appropriate time frame prior to the initiation of the procedure. ANESTHESIA/SEDATION: Moderate (conscious) sedation was employed during this procedure. A total of Versed 2 mg and Fentanyl 100 mcg was administered intravenously by the radiology nurse. Total intra-service moderate Sedation Time: 20 minutes. The patient's level of consciousness and vital signs were monitored continuously by radiology nursing throughout the procedure under my direct supervision. COMPLICATIONS: None immediate. TECHNIQUE: Informed written consent was obtained from the patient after a thorough discussion of the  procedural risks, benefits and alternatives. All questions were addressed. Maximal Sterile Barrier Technique was utilized including caps, mask, sterile gowns, sterile gloves, sterile drape, hand hygiene and skin antiseptic. A timeout was performed prior  to the initiation of the procedure. PROCEDURE: The operative field was prepped with Chlorhexidine in a sterile fashion, and a sterile drape was applied covering the operative field. A sterile gown and sterile gloves were used for the procedure. Local anesthesia was provided with 1% Lidocaine. ASPIRATION The smaller abscess at the lateral margin of the right kidney was identified and an appropriate skin entry site selected and marked. Local anesthesia was attained by infiltration with 1% lidocaine. Under intermittent CT guidance, a 22 gauge spinal needle was carefully advanced into the fluid collection. Aspiration was then performed yielding approximately 2 mL of purulent fluid. This was sent for Gram stain and culture. The needle was removed. DRAIN PLACEMENT The larger presumed abscess at the medial margin of the right kidney was identified an appropriate skin entry site selected and marked. Local anesthesia was attained by infiltration with 1% lidocaine. Under intermittent CT guidance, an 18 gauge spinal needle was carefully advanced into the fluid collection. A 0.035 wire was then coiled in the fluid collection. The percutaneous tract was dilated to 10 [place]. A Cook 10 [place] all-purpose drainage catheter was advanced over the wire and formed. Aspiration yields approximately 7 mL of thick purulent fluid. This was not sent for culture as this particular abscess had previously been aspirated and cultured with a positive diagnosis of E coli. The drain was connected to JP bulb suction and secured to the skin with 0 Prolene suture. FINDINGS: Lateral right renal fluid collection yielding 2 mL purulent fluid. Medial right renal fluid collection yielding 7 mL purulent fluid. IMPRESSION: 1. Successful aspiration of more lateral and smaller right renal abscess. Sample sent for culture. 2. Successful placement of 10 French drain into the medial right renal abscess. PLAN: Renal abscess drain to JP bulb suction. Do not flush  drainage catheter. Electronically Signed   By: Jacqulynn Cadet M.D.   On: 10/23/2021 13:45    Assessment/Plan: 1) Emphysematous pyelonephritis: Clinically stable but not necessarily improved. Drain output from drain placed 7/6 is decreased.  Would consider repeat imaging once drainage is minimal.  Antibiotics per ID.  Culture from 7/6 E coli.  Renal function stable.   LOS: 20 days   Dutch Gray 10/25/2021, 8:57 AM

## 2021-10-25 NOTE — Progress Notes (Signed)
[facility] Progress Note  Patient: [name]     NWG:[id number]  DOA: 10/04/2021   PCP: [facility]       Brief hospital course: This is a 56 year old with insulin requiring diabetes mellitus, hypertension, [name], history of STEMI who presented to the hospital for nausea vomiting and was found to be in septic shock related to emphysematous pyelonephritis.  She was started on ceftriaxone.  Eventually she was weaned off of vasopressors.  Urine and blood culture revealed E. coli. On 6/18, CT revealed gas collections in the right kidney On 6/26, she underwent a perinephric aspiration  Subjective:  Vision is less blurry. Nothing else has changed  Assessment and Plan: Principal Problem:   Emphysematous pyelitis,  E coli pyelonephritis and bacteremia with Septic shock   - ceftriaxone via mid line - 7/6 Repeat CT revealed areas of renal abscesses in the right and the left - ID recommended an IR consult to see if these areas can be aspirated- IR aspirated 2 cc of pus on 7/6 and placed a drain -   abundant WBC and no organisms seen on gram stain- culture NTD- cont to follow drain output  Active Problems: Blurred vision, dizziness, weakness of left side - sudden onset blurred vision and dizziness this AM-admits that dizziness feels like everything is spinning- she had no new meds overnight- on exam left arm is slightly weaker than right  - stat glucose normal - stat MRI ordered and negative  - CBC and Bmet ordered- has not yet been drawn    Insulin-requiring or dependent type II diabetes mellitus ([place]) -Semglee twice daily and NovoLog - Continue to follow sugars    Thrombocytopenia ([place]) - resolved- now has thrombocytosis (likely from infection)  Normocytic anemia - follow- Hgb 7-8 range    AKI (acute kidney injury) ([place]) - presented with Cr of 3.07- ? Due to hypoperfusion in setting of septic shock - no recent Cr to compare with - Cr now  ~ 1.3- difficult to tell if this is chronic    Elevated troponin - 126 followed by 60- in setting of septic shock- no further work up    Acidosis - resolved    Cirrhosis ([place]) - due to NASH       Acute respiratory failure with hypoxia ([place])- resolved  - with acute pulmonary edema - EF 40-45% - required Bipap  - resolved   DVT prophylaxis:  SCDs Start: 10/05/21 0356    Code Status: Full Code  Consultants: ID, Urology Level of Care: Level of care: Med-Surg Disposition Plan:  Status is: Inpatient Remains inpatient appropriate because: ongoing renal infection-   Objective:   Vitals:   10/24/21 2017 10/25/21 0243 10/25/21 0500 10/25/21 0829  BP: (!) 110/45 (!) 102/54  (!) 107/56  Pulse: 80 83  80  Resp:  18  18  Temp: 99 F (37.2 C) 99 F (37.2 C)  98.7 F (37.1 C)  TempSrc: Oral Oral  Oral  SpO2: 93% (!) 89%  95%  Weight:   76.5 kg   Height:       Filed Weights   10/23/21 0500 10/24/21 0448 10/25/21 0500  Weight: 74.8 kg 74.8 kg 76.5 kg   Exam: General exam: Appears comfortable  HEENT: PERRLA, oral mucosa moist, no sclera icterus or thrush Respiratory system: Clear to auscultation. Respiratory effort normal. Cardiovascular system: S1 & S2 heard, regular rate and rhythm Gastrointestinal system: Abdomen soft, tender across abdomen and lumbar area, nondistended.  Normal bowel sounds   Central nervous system: Alert and oriented. States vision is blurred but, on my eval, can read the menu a bit better today- moves all extremities. Extremities: No cyanosis, clubbing or edema Skin: No rashes or ulcers Psychiatry:  Mood & affect appropriate.    Imaging and lab data was personally reviewed    CBC: Recent Labs  Lab 10/20/21 0603 10/21/21 0258 10/22/21 0301 10/22/21 1106 10/24/21 1738  WBC 13.1* 13.1* 13.0* 13.4* 13.6*  HGB 7.7* 8.0* 6.8* 7.5* 7.9*  HCT 23.0* 24.0* 20.3* 23.0* 25.0*  MCV 96.2 96.4 95.3 96.2 95.8  PLT 791* 733* 682* 714* 772*    Basic  Metabolic Panel: Recent Labs  Lab 10/19/21 0153 10/20/21 0603 10/21/21 0258 10/22/21 0301 10/24/21 1738  NA 134* 134* 133* 135 134*  K 4.5 4.4 4.5 4.4 4.6  CL 96* 99 93* 95* 95*  CO2 '27 28 28 27 30  '$ GLUCOSE 96 158* 199* 337* 148*  BUN '12 13 14 15 15  '$ CREATININE 1.12* 1.18* 1.06* 1.30* 1.30*  CALCIUM 8.6* 8.2* 8.7* 8.5* 8.7*    GFR: Estimated Creatinine Clearance: 46.8 mL/min (A) (by C-G formula based on SCr of 1.3 mg/dL (H)).  Scheduled Meds:  sodium chloride   Intravenous Once   Chlorhexidine Gluconate Cloth  6 each Topical Daily   dextrose  1 ampule Intravenous Once   DULoxetine  60 mg Oral QHS   gabapentin  300 mg Oral TID   insulin aspart  0-15 Units Subcutaneous TID WC   insulin aspart  0-5 Units Subcutaneous QHS   insulin aspart  10 Units Subcutaneous TID WC   insulin glargine-yfgn  20 Units Subcutaneous BID   multivitamin with minerals  1 tablet Oral Daily   pantoprazole  40 mg Oral Daily   senna-docusate  1 tablet Oral BID   simvastatin  40 mg Oral QHS   sodium chloride flush  10-40 mL Intracatheter Q12H   sodium chloride flush  3 mL Intravenous Q12H   Continuous Infusions:  sodium chloride Stopped (10/05/21 0517)   cefTRIAXone (ROCEPHIN)  IV 2 g (10/24/21 1744)     LOS: 20 days   Author: Debbe Odea  10/25/2021 2:04 PM

## 2021-10-26 DIAGNOSIS — E119 Type 2 diabetes mellitus without complications: Secondary | ICD-10-CM | POA: Diagnosis not present

## 2021-10-26 DIAGNOSIS — R7881 Bacteremia: Secondary | ICD-10-CM | POA: Diagnosis not present

## 2021-10-26 DIAGNOSIS — N12 Tubulo-interstitial nephritis, not specified as acute or chronic: Secondary | ICD-10-CM | POA: Diagnosis not present

## 2021-10-26 DIAGNOSIS — A419 Sepsis, unspecified organism: Secondary | ICD-10-CM | POA: Diagnosis not present

## 2021-10-26 LAB — GLUCOSE, CAPILLARY
Glucose-Capillary: 211 mg/dL — ABNORMAL HIGH (ref 70–99)
Glucose-Capillary: 216 mg/dL — ABNORMAL HIGH (ref 70–99)
Glucose-Capillary: 179 mg/dL — ABNORMAL HIGH (ref 70–99)
Glucose-Capillary: 239 mg/dL — ABNORMAL HIGH (ref 70–99)

## 2021-10-26 MED ORDER — TRAMADOL HCL 50 MG PO TABS
50.0000 mg | ORAL_TABLET | Freq: Three times a day (TID) | ORAL | Status: DC | PRN
  Administered 2021-10-26 – 2021-10-28 (×5): 50 mg via ORAL
  Filled 2021-10-26 (×5): qty 1

## 2021-10-26 MED ORDER — INSULIN GLARGINE-YFGN 100 UNIT/ML ~~LOC~~ SOLN
24.0000 [IU] | Freq: Two times a day (BID) | SUBCUTANEOUS | Status: DC
  Administered 2021-10-26 – 2021-10-28 (×4): 24 [IU] via SUBCUTANEOUS
  Filled 2021-10-26 (×5): qty 0.24

## 2021-10-26 NOTE — Consult Note (Signed)
Ophthalmology Consult Note  Subjective: Patient is a 56 year old diabetic admitted for septic shock 2/2 to pyelonephritis. Ophthalmology was consulted for blurred vision. Patient states that her vision gets blurred every time she gets contrast dye. States the last time she had clear vision was right before the procedure. Has occurred multiple times since hospitalized. Patient does wear progressives which she has at bedside. Even with glasses, she reports blurred vision  Objective: Vital signs in last 24 hours: Temp:  [98.5 F (36.9 C)-99.3 F (37.4 C)] 98.6 F (37 C) (07/09 0904) Pulse Rate:  [78-87] 80 (07/09 0904) Resp:  [16-19] 19 (07/09 0904) BP: (87-113)/(51-61) 113/61 (07/09 0904) SpO2:  [90 %-94 %] 94 % (07/09 0904) Weight change:  Last BM Date : 10/26/21  Intake/Output from previous day: 07/08 0701 - 07/09 0700 In: 900 [P.O.:900] Out: 0  Intake/Output this shift: Total I/O In: 240 [P.O.:240] Out: 10 [Drains:10]  Base Eye Exam  Visual Acuity (ETDRS)   Right Left  Near cc +1.50 20/25 20/30  Dist ph [place]     Tonometry (Tonopen)   Right Left  Pressure 10 13   Pupils   APD  Right None  Left None   Visual Fields   Right Left   Full Full   Extraocular Movement   Right Left   Full Full   Neuro/Psych  Oriented x3: Yes  Mood/Affect: Normal         Slit Lamp and Fundus Exam   External Exam   Right Left  External Normal Normal   Slit Lamp Exam   Right Left  Lids/Lashes Normal Normal  Conjunctiva/Sclera White and quiet, no vessels White and quiet, no vessels  Cornea Clear Clear  Anterior Chamber Deep and quiet Deep and quiet  Iris Grossly normal Grossly normal  Lens NS NS       Fundus Exam   Right Clear  Posterior Vitreous Clear, no vitritis Clear, no vitiritis  Disc Pink, sharp margins Pink, sharp margins  C/D Ratio 0.1 0.1  Macula MA, DBH Mas, DBH  Vessels Normal course and caliber, 2 CWS at inferior arcade Normal course and caliber,  CWS nasal to the optic nerve  Periphery Attached, DBH, MAs Attached, DBH, MAs     Recent Labs    10/24/21 1738  WBC 13.6*  HGB 7.9*  HCT 25.0*  NA 134*  K 4.6  CL 95*  CO2 30  BUN 15  CREATININE 1.30*    Studies/Results: MR BRAIN WO CONTRAST  Result Date: 10/24/2021 CLINICAL DATA:  Dizziness, persistent/recurrent, cardiac or vascular cause suspected left arm weakness, dizziness, blurred vision EXAM: MRI HEAD WITHOUT CONTRAST TECHNIQUE: Multiplanar, multiecho pulse sequences of the brain and surrounding structures were obtained without intravenous contrast. COMPARISON:  2014 FINDINGS: Brain: There is no acute infarction or intracranial hemorrhage. There is no intracranial mass, mass effect, or edema. There is no hydrocephalus or extra-axial fluid collection. Ventricles and sulci are normal in size and configuration. Vascular: Major vessel flow voids at the skull base are preserved. Skull and upper cervical spine: Normal marrow signal is preserved. Sinuses/Orbits: Paranasal sinuses are aerated. Orbits are unremarkable. Other: Sella is unremarkable. Patchy bilateral mastoid fluid opacification. IMPRESSION: No acute infarction, hemorrhage, or mass. Electronically Signed   By: Macy Mis M.D.   On: 10/24/2021 16:24    Assessment/Plan:  Blurred Vision OU -Mild decrease in vision. Tried different over the counter readers and patient was able to get down to 20/25 right eye and 20/30 left eye  with +1.50 which is a low power for readers. Did not have lower power readers on hand but suspect would be able to get to 20/20 with +1.00. Suspect myopic shift. Patient is a diabetic with blood glucose trending up. Myopic shift is likely from elevated blood glucose. She reports that this has happened previously during this hospitalization, notes a pattern after contrast dye. Unsure if contrast dye causes elevated BG. Recommend better glucose control and her vision will slowly return back to normal.    Diabetic retinopathy, moderate, both eyes -Patient has been told previously that she has diabetic retinopathy. Moderate amounts. Recommend better diabetic control and outpatient follow up with ophthalmology. She does have cotton wool spots, which is most likely attributed to DM but will re-examine patient tomorrow to make sure it does not worsen. Do not suspect septic embolism since eye appears quiet.    LOS: 21 days   Annya Lizana T Annelle Behrendt 10/26/2021

## 2021-10-26 NOTE — Progress Notes (Signed)
[facility] Progress Note  Patient: [name]     MHD:[id number]  DOA: 10/04/2021   PCP: [facility]       Brief hospital course: This is a 56 year old with insulin requiring diabetes mellitus, hypertension, [name], history of STEMI who presented to the hospital for nausea vomiting and was found to be in septic shock related to emphysematous pyelonephritis.  She was started on ceftriaxone.  Eventually she was weaned off of vasopressors.  Urine and blood culture revealed E. coli. On 6/18, CT revealed gas collections in the right kidney On 6/26, she underwent a perinephric aspiration  Subjective:  Vision is still blurry. Having cramping in back radiating to abdomen. Having posterior neck pain.   Assessment and Plan: Principal Problem:   Emphysematous pyelitis,  E coli pyelonephritis and bacteremia with Septic shock   - ceftriaxone via mid line - 7/6 Repeat CT revealed areas of renal abscesses in the right and the left - ID recommended an IR consult to see if these areas can be aspirated- IR aspirated 2 cc of pus on 7/6 and placed a drain -   abundant WBC and no organisms seen on gram stain- culture NTD- drain output 0-10 cc  Active Problems: Blurred vision, dizziness, weakness of left side - sudden onset blurred vision and dizziness this AM-admits that dizziness feels like everything is spinning- she had no new meds overnight- on exam left arm slightly weaker than right  - stat glucose normal - stat MRI ordered and negative - vision slightly better but not resolving - dizziness and weakness resolved - have spoken with Dr [name] (opthal) and requested a consult.   Insulin-requiring or dependent type II diabetes mellitus ([place]) - increase Semglee to 24 U BID from 20 - Continue to follow sugars    Thrombocytopenia (HCC) - resolved- now has thrombocytosis (likely from infection)  Normocytic anemia - follow- Hgb 7-8 range    AKI (acute kidney  injury) ([place])- unspecified if CKD - presented with Cr of 3.07- ? 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       Acute respiratory failure with hypoxia ([place])- resolved  - with acute pulmonary edema - EF 40-45% - required Bipap  - resolved   DVT prophylaxis:  SCDs Start: 10/05/21 0356    Code Status: Full Code  Consultants: ID, Urology Level of Care: Level of care: Med-Surg Disposition Plan:  Status is: Inpatient Remains inpatient appropriate because: ongoing renal infection-   Objective:   Vitals:   10/25/21 1629 10/25/21 2015 10/26/21 0333 10/26/21 0904  BP: (!) 87/51 (!) 107/51 106/60 113/61  Pulse: 78 81 87 80  Resp: '19 16 16 19  '$ Temp: 98.5 F (36.9 C) 99.3 F (37.4 C) 98.8 F (37.1 C) 98.6 F (37 C)  TempSrc: Oral Oral Oral Oral  SpO2: 94% 94% 90% 94%  Weight:      Height:       Filed Weights   10/23/21 0500 10/24/21 0448 10/25/21 0500  Weight: 74.8 kg 74.8 kg 76.5 kg   Exam: General exam: Appears comfortable  HEENT: PERRLA, oral mucosa moist, no sclera icterus or thrush Respiratory system: Clear to auscultation.  Respiratory effort normal. Cardiovascular system: S1 & S2 heard, regular rate and rhythm Gastrointestinal system: Abdomen soft, tender in right abdomen as before, nondistended. Normal bowel sounds   Central nervous system: Alert and oriented. No focal neurological deficits. Extremities: No cyanosis, clubbing or edema Skin: No rashes or ulcers Psychiatry:  Mood & affect appropriate.    Imaging and lab data was personally reviewed    CBC: Recent Labs  Lab 10/20/21 0603 10/21/21 0258 10/22/21 0301 10/22/21 1106 10/24/21 1738  WBC 13.1* 13.1* 13.0* 13.4* 13.6*  HGB 7.7* 8.0* 6.8* 7.5* 7.9*  HCT 23.0*  24.0* 20.3* 23.0* 25.0*  MCV 96.2 96.4 95.3 96.2 95.8  PLT 791* 733* 682* 714* 772*    Basic Metabolic Panel: Recent Labs  Lab 10/20/21 0603 10/21/21 0258 10/22/21 0301 10/24/21 1738  NA 134* 133* 135 134*  K 4.4 4.5 4.4 4.6  CL 99 93* 95* 95*  CO2 '28 28 27 30  '$ GLUCOSE 158* 199* 337* 148*  BUN '13 14 15 15  '$ CREATININE 1.18* 1.06* 1.30* 1.30*  CALCIUM 8.2* 8.7* 8.5* 8.7*    GFR: Estimated Creatinine Clearance: 46.8 mL/min (A) (by C-G formula based on SCr of 1.3 mg/dL (H)).  Scheduled Meds:  sodium chloride   Intravenous Once   Chlorhexidine Gluconate Cloth  6 each Topical Daily   dextrose  1 ampule Intravenous Once   DULoxetine  60 mg Oral QHS   gabapentin  300 mg Oral TID   insulin aspart  0-15 Units Subcutaneous TID WC   insulin aspart  0-5 Units Subcutaneous QHS   insulin aspart  10 Units Subcutaneous TID WC   insulin glargine-yfgn  20 Units Subcutaneous BID   multivitamin with minerals  1 tablet Oral Daily   pantoprazole  40 mg Oral Daily   senna-docusate  1 tablet Oral BID   simvastatin  40 mg Oral QHS   sodium chloride flush  10-40 mL Intracatheter Q12H   sodium chloride flush  3 mL Intravenous Q12H   Continuous Infusions:  sodium chloride Stopped (10/05/21 0517)   cefTRIAXone (ROCEPHIN)  IV 2 g (10/25/21 1645)     LOS: 21 days   Author: Debbe Odea  10/26/2021 12:52 PM

## 2021-10-26 NOTE — Plan of Care (Signed)
  Problem: Education: Goal: Individualized Educational Video(s) Outcome: Progressing   Problem: Coping: Goal: Ability to adjust to condition or change in health will improve Outcome: Progressing   Problem: Fluid Volume: Goal: Ability to maintain a balanced intake and output will improve Outcome: Progressing   Problem: Health Behavior/Discharge Planning: Goal: Ability to identify and utilize available resources and services will improve Outcome: Progressing Goal: Ability to manage health-related needs will improve Outcome: Progressing   Problem: Metabolic: Goal: Ability to maintain appropriate glucose levels will improve Outcome: Progressing   Problem: Nutritional: Goal: Maintenance of adequate nutrition will improve Outcome: Progressing Goal: Progress toward achieving an optimal weight will improve Outcome: Progressing   Problem: Skin Integrity: Goal: Risk for impaired skin integrity will decrease Outcome: Progressing   Problem: Tissue Perfusion: Goal: Adequacy of tissue perfusion will improve Outcome: Progressing   Problem: Fluid Volume: Goal: Hemodynamic stability will improve Outcome: Progressing   Problem: Clinical Measurements: Goal: Diagnostic test results will improve Outcome: Progressing Goal: Signs and symptoms of infection will decrease Outcome: Progressing   Problem: Respiratory: Goal: Ability to maintain adequate ventilation will improve Outcome: Progressing   Problem: Education: Goal: Knowledge of General Education information will improve Description: Including pain rating scale, medication(s)/side effects and non-pharmacologic comfort measures Outcome: Progressing   Problem: Health Behavior/Discharge Planning: Goal: Ability to manage health-related needs will improve Outcome: Progressing   Problem: Clinical Measurements: Goal: Ability to maintain clinical measurements within normal limits will improve Outcome: Progressing Goal: Will remain  free from infection Outcome: Progressing Goal: Diagnostic test results will improve Outcome: Progressing Goal: Respiratory complications will improve Outcome: Progressing Goal: Cardiovascular complication will be avoided Outcome: Progressing   Problem: Activity: Goal: Risk for activity intolerance will decrease Outcome: Progressing   Problem: Nutrition: Goal: Adequate nutrition will be maintained Outcome: Progressing   Problem: Coping: Goal: Level of anxiety will decrease Outcome: Progressing   Problem: Elimination: Goal: Will not experience complications related to bowel motility Outcome: Progressing Goal: Will not experience complications related to urinary retention Outcome: Progressing   Problem: Pain Managment: Goal: General experience of comfort will improve Outcome: Progressing   Problem: Safety: Goal: Ability to remain free from injury will improve Outcome: Progressing   Problem: Skin Integrity: Goal: Risk for impaired skin integrity will decrease Outcome: Progressing

## 2021-10-27 DIAGNOSIS — D72829 Elevated white blood cell count, unspecified: Secondary | ICD-10-CM | POA: Diagnosis not present

## 2021-10-27 DIAGNOSIS — E119 Type 2 diabetes mellitus without complications: Secondary | ICD-10-CM | POA: Diagnosis not present

## 2021-10-27 DIAGNOSIS — N12 Tubulo-interstitial nephritis, not specified as acute or chronic: Secondary | ICD-10-CM | POA: Diagnosis not present

## 2021-10-27 DIAGNOSIS — R7881 Bacteremia: Secondary | ICD-10-CM | POA: Diagnosis not present

## 2021-10-27 LAB — CBC
HCT: 23.8 % — ABNORMAL LOW (ref 36.0–46.0)
Hemoglobin: 7.9 g/dL — ABNORMAL LOW (ref 12.0–15.0)
MCH: 31.3 pg (ref 26.0–34.0)
MCHC: 33.2 g/dL (ref 30.0–36.0)
MCV: 94.4 fL (ref 80.0–100.0)
Platelets: 772 10*3/uL — ABNORMAL HIGH (ref 150–400)
RBC: 2.52 MIL/uL — ABNORMAL LOW (ref 3.87–5.11)
RDW: 15 % (ref 11.5–15.5)
WBC: 12.6 10*3/uL — ABNORMAL HIGH (ref 4.0–10.5)
nRBC: 0 % (ref 0.0–0.2)

## 2021-10-27 LAB — GLUCOSE, CAPILLARY
Glucose-Capillary: 195 mg/dL — ABNORMAL HIGH (ref 70–99)
Glucose-Capillary: 189 mg/dL — ABNORMAL HIGH (ref 70–99)
Glucose-Capillary: 106 mg/dL — ABNORMAL HIGH (ref 70–99)

## 2021-10-27 MED ORDER — METHOCARBAMOL 500 MG PO TABS
500.0000 mg | ORAL_TABLET | Freq: Three times a day (TID) | ORAL | Status: DC
  Administered 2021-10-27 – 2021-10-28 (×4): 500 mg via ORAL
  Filled 2021-10-27 (×4): qty 1

## 2021-10-27 NOTE — Progress Notes (Signed)
Mobility Specialist Criteria Algorithm Info.   10/27/21 1605  Mobility  Activity Ambulated independently in hallway;Dangled on edge of bed  Range of Motion/Exercises Active;All extremities  Level of Assistance Independent  Assistive Device None  Distance Ambulated (ft) 1800 ft (+)   Patient received dangling EOB eager to participate in mobility. Ambulated in hallway with supervision. Returned to room without complaint or incident. Was left dangling with all needs met, call bell in reach.   10/27/2021 4:37 PM  [place], [place], [place]  AUQJF:[phone] Office: [phone]

## 2021-10-27 NOTE — TOC Progression Note (Signed)
Transition of Care [facility]) - Progression Note    Patient Details  Name: [name] MRN: [id number] Date of Birth: [birth year]  [facility]) CM/SW Contact  Tom-Johnson, Renea Ee, RN Phone Number: 10/27/2021, 4:31 PM  Clinical Narrative:     Patient remains Inpatient due to ongoing Renal infection. Continues IV abx. CM will continue to follow with needs.   Expected Discharge Plan: [place] Barriers to Discharge: Continued Medical Work up  Expected Discharge Plan and Services Expected Discharge Plan: [place]   Discharge Planning Services: CM Consult Post Acute Care Choice: [place] arrangements for the past 2 months: Single Family Home                   DME Agency: NA       HH Arranged: PT, RN [place] Agency: [place] ([facility]) Date HH Agency Contacted: 10/10/21 Time [place]: 1034 Representative spoke with at [place]: Caryl Pina , will need orders   Social Determinants of Health (SDOH) Interventions    Readmission Risk Interventions     No data to display

## 2021-10-27 NOTE — Progress Notes (Signed)
Referring Physician(s): Dr. Wynelle Cleveland  Supervising Physician: Ruthann Cancer, MD  Patient Status:  [facility] - In-pt  Chief Complaint: Emphysematous pyelonephritis with abscess s/p placement of a 70F drain into the medial right kidney.   Subjective: Patient in bed, reporting flank pain radiating around to front of abdomen.   Allergies: Aspartame and phenylalanine, Toradol [ketorolac tromethamine], Sucralose, Codeine, Dilaudid [hydromorphone hcl], Hydrocodone, and Oxycodone  Medications: Prior to Admission medications   Medication Sig Start Date End Date Taking? Authorizing Provider  albuterol (VENTOLIN HFA) 108 (90 Base) MCG/ACT inhaler Inhale 2 puffs into the lungs every 4 (four) hours as needed for shortness of breath or wheezing. 05/27/21  Yes [provider]  Cholecalciferol (VITAMIN D3) 1.25 MG (50000 UT) CAPS Take 50,000 Units by mouth every Sunday. 10/02/21  Yes [provider]  DULoxetine (CYMBALTA) 60 MG capsule Take 60 mg by mouth at bedtime. 09/16/21  Yes [provider]  famotidine (PEPCID) 20 MG tablet Take 20-40 mg by mouth daily as needed for heartburn or indigestion.   Yes [provider]  gabapentin (NEURONTIN) 600 MG tablet Take 1,200 mg by mouth at bedtime. 08/12/21  Yes [provider]  glimepiride (AMARYL) 4 MG tablet Take 4 mg by mouth daily. 10/02/21  Yes [provider]  HYDROcodone-acetaminophen (NORCO) 7.5-325 MG tablet Take 1 tablet by mouth every 6 (six) hours as needed for moderate pain.   Yes [provider]  ibuprofen (ADVIL) 200 MG tablet Take 800 mg by mouth every 6 (six) hours as needed for mild pain.   Yes [provider]  ibuprofen (ADVIL) 800 MG tablet Take 800 mg by mouth every 8 (eight) hours as needed for mild pain.   Yes [provider]  Insulin Glargine (BASAGLAR KWIKPEN) 100 UNIT/ML Inject 30 Units into the skin at bedtime.   Yes [provider]  lisinopril (ZESTRIL) 10  MG tablet Take 10 mg by mouth daily. 09/16/21  Yes [provider]  metFORMIN (GLUCOPHAGE) 1000 MG tablet Take 1,000 mg by mouth 2 (two) times daily. 09/07/21  Yes [provider]  Probiotic Product (PROBIOTIC PO) Take 1 capsule by mouth daily.   Yes [provider]  simvastatin (ZOCOR) 40 MG tablet Take 40 mg by mouth at bedtime. 09/14/21  Yes [provider]     Vital Signs: BP (!) 94/49   Pulse 80   Temp 98.8 F (37.1 C) (Oral)   Resp 19   Ht 5' 2.5" (1.588 m)   Wt 164 lb 14.5 oz (74.8 kg)   SpO2 95%   BMI 29.68 kg/m   Physical Exam Constitutional:      General: She is not in acute distress. Pulmonary:     Effort: Pulmonary effort is normal.  Abdominal:     Comments: Right flank/lower back drain to suction bulb. Approximately 5 ml of purulent material in bulb. Dressing is clean/dry. Site is tender to palpation.   Skin:    General: Skin is warm and dry.  Neurological:     Mental Status: She is alert and oriented to person, place, and time.     Imaging: CT IMAGE GUIDED DRAINAGE BY PERCUTANEOUS CATHETER  Result Date: 10/23/2021 INDICATION: Multifocal renal abscesses EXAM: CT GUIDED DRAINAGE OF RIGHT RENAL ABSCESS CT GUIDED ASPIRATION OF RIGHT RENAL ABSCESS TECHNIQUE: Multidetector CT imaging of the abdomen was performed following the standard protocol without IV contrast. RADIATION DOSE REDUCTION: This exam was performed according to the departmental dose-optimization program which includes automated  exposure control, adjustment of the mA and/or kV according to patient size and/or use of iterative reconstruction technique. MEDICATIONS: The patient is currently admitted to the hospital and receiving intravenous antibiotics. The antibiotics were administered within an appropriate time frame prior to the initiation of the procedure. ANESTHESIA/SEDATION: Moderate (conscious) sedation was employed during this procedure. A total of Versed 2 mg and  Fentanyl 100 mcg was administered intravenously by the radiology nurse. Total intra-service moderate Sedation Time: 20 minutes. The patient's level of consciousness and vital signs were monitored continuously by radiology nursing throughout the procedure under my direct supervision. COMPLICATIONS: None immediate. TECHNIQUE: Informed written consent was obtained from the patient after a thorough discussion of the procedural risks, benefits and alternatives. All questions were addressed. Maximal Sterile Barrier Technique was utilized including caps, mask, sterile gowns, sterile gloves, sterile drape, hand hygiene and skin antiseptic. A timeout was performed prior to the initiation of the procedure. PROCEDURE: The operative field was prepped with Chlorhexidine in a sterile fashion, and a sterile drape was applied covering the operative field. A sterile gown and sterile gloves were used for the procedure. Local anesthesia was provided with 1% Lidocaine. ASPIRATION The smaller abscess at the lateral margin of the right kidney was identified and an appropriate skin entry site selected and marked. Local anesthesia was attained by infiltration with 1% lidocaine. Under intermittent CT guidance, a 22 gauge spinal needle was carefully advanced into the fluid collection. Aspiration was then performed yielding approximately 2 mL of purulent fluid. This was sent for Gram stain and culture. The needle was removed. DRAIN PLACEMENT The larger presumed abscess at the medial margin of the right kidney was identified an appropriate skin entry site selected and marked. Local anesthesia was attained by infiltration with 1% lidocaine. Under intermittent CT guidance, an 18 gauge spinal needle was carefully advanced into the fluid collection. A 0.035 wire was then coiled in the fluid collection. The percutaneous tract was dilated to 10 [place]. A Cook 10 [place] all-purpose drainage catheter was advanced over the wire and formed. Aspiration  yields approximately 7 mL of thick purulent fluid. This was not sent for culture as this particular abscess had previously been aspirated and cultured with a positive diagnosis of E coli. The drain was connected to JP bulb suction and secured to the skin with 0 Prolene suture. FINDINGS: Lateral right renal fluid collection yielding 2 mL purulent fluid. Medial right renal fluid collection yielding 7 mL purulent fluid. IMPRESSION: 1. Successful aspiration of more lateral and smaller right renal abscess. Sample sent for culture. 2. Successful placement of 10 French drain into the medial right renal abscess. PLAN: Renal abscess drain to JP bulb suction. Do not flush drainage catheter. Electronically Signed   By: Jacqulynn Cadet M.D.   On: 10/23/2021 13:45   CT ABDOMEN PELVIS W CONTRAST  Result Date: 10/21/2021 CLINICAL DATA:  Severe pyelonephritis. Status post CT-guided right perinephric abscess aspiration. EXAM: CT ABDOMEN AND PELVIS WITH CONTRAST TECHNIQUE: Multidetector CT imaging of the abdomen and pelvis was performed using the standard protocol following bolus administration of intravenous contrast. RADIATION DOSE REDUCTION: This exam was performed according to the departmental dose-optimization program which includes automated exposure control, adjustment of the mA and/or kV according to patient size and/or use of iterative reconstruction technique. CONTRAST:  14m OMNIPAQUE IOHEXOL 300 MG/ML  SOLN COMPARISON:  10/11/2021 FINDINGS: Lower chest: Small to moderate right pleural effusion with overlying atelectasis is mildly increased in volume from previous exam. Subpleural atelectasis noted in  the left base. Hepatobiliary: No focal liver abnormality is seen. No gallstones, gallbladder wall thickening, or biliary dilatation. Pancreas: Unremarkable. No pancreatic ductal dilatation or surrounding inflammatory changes. Spleen: Status post splenectomy. Adrenals/Urinary Tract: Normal adrenal glands. Right-sided  perinephric fat stranding is again noted. Multiple areas of fluid attenuation within the right kidney are again identified. Compared with the previous exam there is been interval resolution gas within these fluid density areas. The largest arises off the medial aspect of the upper pole of right kidney measuring 3.9 x 3.5 cm, image 27/3. Formally this measured 4.1 x 2.9 cm. Fluid collection along the lateral aspect of the upper pole of right kidney measures 2.8 x 1.7 cm, image 27/3. Formally 2.8 x 2.0 cm. Fluid collection overlying the upper pole of the right kidney measures 2.9 by 1.9 cm, image 93/6. Previously 2.4 x 1.6 cm. Additional smaller areas of fluid density within the right kidney are unchanged. Striated nephrographic appearance of the left kidney is noted. Large area of lobar nephronia arising off the lateral cortex of the interpolar left kidney is improved in the interval, image 27/3. Similar appearance of right-sided pelvocaliectasis. No left-sided hydronephrosis. Urinary bladder is decompressed around a Foley catheter balloon. Stomach/Bowel: Stomach appears normal. The appendix is visualized and appears normal. No bowel wall thickening, inflammation, or distension. Vascular/Lymphatic: Aortic atherosclerosis. 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 Multiple prominent upper abdominal lymph nodes are identified likely reflecting reactive adenopathy. Reproductive: Status post hysterectomy. No adnexal masses. Other: No free fluid.  No signs of pneumoperitoneum. Musculoskeletal: Diffuse body wall edema is favored to represent anasarca. No acute or suspicious osseous findings. Degenerative disc disease noted at L5-S1. IMPRESSION: 1. 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 There is been interval resolution of gas within the right kidney fluid collections. Volume of fluid collections appear  stable in the interval. Progressive changes identified. 2. Left-sided pyelonephritis is again noted. Dominant area of lobar nephronia involving the left kidney has improved in the interval. 3. Small to moderate right pleural effusion with overlying atelectasis is mildly increased in volume from previous exam. 4. 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 5. Diffuse body wall edema is favored to represent anasarca. 6. Aortic Atherosclerosis (ICD10-I70.0). Electronically Signed   By: Kerby Moors M.D.   On: 10/21/2021 09:42    Labs:  CBC: Recent Labs    10/20/21 0603 10/21/21 0258 10/22/21 0301 10/22/21 1106  WBC 13.1* 13.1* 13.0* 13.4*  HGB 7.7* 8.0* 6.8* 7.5*  HCT 23.0* 24.0* 20.3* 23.0*  PLT 791* 733* 682* 714*    COAGS: Recent Labs    10/04/21 2241 10/06/21 1808 10/07/21 0611 10/23/21 0808  INR 1.2 1.2 1.1 1.2  APTT  --  28  --   --     BMP: Recent Labs    10/19/21 0153 10/20/21 0603 10/21/21 0258 10/22/21 0301  NA 134* 134* 133* 135  K 4.5 4.4 4.5 4.4  CL 96* 99 93* 95*  CO2 '27 28 28 27  '$ GLUCOSE 96 158* 199* 337*  BUN '12 13 14 15  '$ CALCIUM 8.6* 8.2* 8.7* 8.5*  CREATININE 1.12* 1.18* 1.06* 1.30*  GFRNONAA 58* 54* >60 48*    LIVER FUNCTION TESTS: Recent Labs    10/19/21 0153 10/20/21 0603 10/21/21 0258 10/22/21 0301  BILITOT 0.3 0.4 0.2* 0.4  AST 54* 61* 51* 39  ALT 47* 52* 52* 45*  ALKPHOS 150* 151*  153* 139*  PROT 6.8 6.1* 6.3* 5.8*  ALBUMIN 2.0* 1.7* 2.1* 1.8*    Assessment and Plan:  Emphysematous pyelonephritis with abscess s/p placement of a 23F drain into the medial right kidney 10/23/21 by Dr. Laurence Ferrari.  Output by Drain (mL) 10/25/21 0700 - 10/25/21 1459 10/25/21 1500 - 10/25/21 2259 10/25/21 2300 - 10/26/21 0659 10/26/21 0700 - 10/26/21 1459 10/26/21 1500 - 10/26/21 2259 10/26/21 2300 - 10/27/21 0659 10/27/21 0700 - 10/27/21 1459 10/27/21 1500 - 10/27/21 1711  Closed System Drain 1 Right Back Bulb (JP) 10 Fr.    10    15      Current examination: 5 ml of purulent fluid in bulb.  Dressed appropriately.    Plan: Continue TID flushes with 5 cc NS. Record output Q shift. Dressing changes QD or PRN if soiled.   Call IR APP or on call IR MD if difficulty flushing or sudden change in drain output.  Repeat imaging/possible drain injection once output < 10 mL/QD (excluding flush material). Consideration for drain removal if output is < 10 mL/QD (excluding flush material), pending discussion with the providing surgical service.  Discharge planning: Please contact IR APP or on call IR MD prior to patient d/c to ensure appropriate follow up plans are in place. Typically patient will follow up with IR clinic 10-14 days post d/c for repeat imaging/possible drain injection. IR scheduler will contact patient with date/time of appointment. Patient will need to flush drain QD with 5 cc NS, record output QD, dressing changes every 2-3 days or earlier if soiled.   IR will continue to follow - please call with questions or concerns.  Electronically Signed: Pasty Spillers, PA-C 10/27/2021, 5:12 PM    I spent a total of 15 Minutes at the the patient's bedside AND on the patient's hospital floor or unit, greater than 50% of which was counseling/coordinating care for right renal abscess drain.

## 2021-10-27 NOTE — Progress Notes (Signed)
Ophthalmology Progress Note  Subjective: Patient reports her vision has returned to normal. She reiterates that she thinks it's due to the contrast dye.  Objective: Vital signs in last 24 hours: Temp:  [98 F (36.7 C)-98.6 F (37 C)] 98 F (36.7 C) (07/10 1024) Pulse Rate:  [69-77] 69 (07/10 1024) Resp:  [18-19] 19 (07/10 1024) BP: (106-141)/(56-67) 109/63 (07/10 1024) SpO2:  [90 %-94 %] 94 % (07/10 1024) Weight:  [76.4 kg] 76.4 kg (07/10 0452) Weight change:  Last BM Date : 10/26/21  Intake/Output from previous day: 07/09 0701 - 07/10 0700 In: 1480 [P.O.:580; IV Piggyback:900] Out: 25 [Drains:25] Intake/Output this shift: Total I/O In: 540 [P.O.:540] Out: -   Base Eye Exam       Visual Acuity (ETDRS)     Right Left  Near cc +2.00 20/20 20/20  Dist ph [place]        Tonometry (Tonopen)     Right Left  Pressure 14 13   Pupils     APD  Right None  Left None    Visual Fields     Right Left    Full Full    Extraocular Movement     Right Left    Full Full    Neuro/Psych   Oriented x3: Yes  Mood/Affect: Normal              Slit Lamp and Fundus Exam     External Exam     Right Left  External Normal Normal    Slit Lamp Exam     Right Left  Lids/Lashes Normal Normal  Conjunctiva/Sclera White and quiet, no vessels White and quiet, no vessels  Cornea Clear Clear  Anterior Chamber Deep and quiet Deep and quiet  Iris Grossly normal Grossly normal  Lens NS NS           Fundus Exam     Right Clear  Posterior Vitreous Clear, no vitritis Clear, no vitiritis  Disc Pink, sharp margins Pink, sharp margins  C/D Ratio 0.1 0.1  Macula MA, DBH Mas, DBH  Vessels Normal course and caliber, 2 CWS at inferior arcade, 1 CWS at superonasal arcade Normal course and caliber, CWS nasal to the optic nerve  Periphery Attached, DBH, MAs Attached, DBH, MAs    Recent Labs    10/24/21 1738 10/27/21 1045  WBC 13.6* 12.6*  HGB 7.9* 7.9*  HCT 25.0* 23.8*  NA  134*  --   K 4.6  --   CL 95*  --   CO2 30  --   BUN 15  --   CREATININE 1.30*  --     Studies/Results: No results found.  Assessment/Plan:  Blurred Vision OU -Mild decrease in vision. Tried different over the counter readers and patient was able to get down to 20/25 right eye and 20/30 left eye with +1.50 which is a low power for readers. Did not have lower power readers on hand but suspect would be able to get to 20/20 with +1.00. Suspect myopic shift. Patient is a diabetic with blood glucose trending up. Myopic shift is likely from elevated blood glucose. She reports that this has happened previously during this hospitalization, notes a pattern after contrast dye. Unsure if contrast dye causes elevated BG. Recommend better glucose control and her vision will slowly return back to normal.   10/27/21: Resolved. Patient sees 20/20 on near card with +2.00 readers, indicating reversal of the myopic shift. Subjectively patient notes her vision has returned back  to normal.   Diabetic retinopathy, moderate, both eyes -Patient has been told previously that she has diabetic retinopathy. Moderate amounts. Recommend better diabetic control and outpatient follow up with ophthalmology. She does have cotton wool spots, which is most likely attributed to DM but will re-examine patient tomorrow to make sure it does not worsen. Do not suspect septic embolism since eye appears quiet.  10/27/21: Stable diabetic exam. Recommend patient follow up with ophthalmology outpatient. Contact info for our office can be given at discharge. Ophthalmology will sign off, please reconsult if she has any further visual/eye issues.  Dr. Arnoldo Hooker at [facility] Address: [address] [place], [place] [address] Phone: [phone]. Fax: [phone]      LOS: 22 days   [place] 10/27/2021

## 2021-10-27 NOTE — Progress Notes (Signed)
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  She was started on ceftriaxone.  Eventually she was weaned off of vasopressors.  Urine and blood culture revealed E. coli. On 6/18, CT revealed gas collections in the right kidney On 6/26, she underwent a perinephric aspiration  Subjective:  She was asleep when I entered. Her only complaint was that she was having spasms in her right lower back.    Assessment and Plan: Principal Problem:   Emphysematous pyelitis,  E coli pyelonephritis and bacteremia with Septic shock   - ceftriaxone via mid line - 7/6 Repeat CT revealed areas of renal abscesses in the right and the left - ID recommended an IR consult to see if these areas can be aspirated- IR aspirated 2 cc of pus on 7/6 and placed a drain -   abundant WBC and no organisms seen on gram stain- culture NTD - drain output > 25 cc documented yesterday -Remains on ceftriaxone  Active Problems: Blurred vision, dizziness, weakness of left side on 7/7 - sudden onset blurred vision and dizziness t dizziness felt like vertigo had very mild weakness in the left arm on exam - stat glucose 281 - stat MRI ordered and negative - vision slightly better but not resolving - dizziness and weakness resolved -consulted Dr [name] (opthal)-she feels blurred vision may be related to elevated sugars   Insulin-requiring or dependent type II diabetes mellitus ([place]) -Sugars peaking at 239 at 1055 yesterday.  Increase Semglee to 24 U BID from 20 today - She is continuing to receive 10 units of NovoLog  & NovoLog based on a moderate dose sliding scale with each meal  and nightly NovoLog per sliding scale - Continue to follow sugars and adjust insulin    Thrombocytopenia ([place]) - resolved- now has thrombocytosis (likely from infection)  Normocytic anemia - follow- Hgb 7-8 range    AKI (acute kidney injury) ([place])- unspecified if CKD - presented with Cr of 3.07- ? 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       Acute respiratory failure with hypoxia ([place])- resolved  - with acute pulmonary edema - EF 40-45% - required Bipap  - resolved   DVT prophylaxis:  SCDs Start: 10/05/21 0356    Code Status: Full Code  Consultants: ID, Urology Level of Care: Level of care: Med-Surg Disposition Plan:  Status is: Inpatient Remains inpatient appropriate because: ongoing renal infection-   Objective:   Vitals:   10/26/21 1632 10/26/21 2054 10/27/21 0452 10/27/21 1024  BP: 123/84 (!) 141/67 (!) 106/56 109/63  Pulse: 70 77 73 69  Resp: '18 18 18 19  '$ Temp: 97.8 F (36.6 C) 98.6 F (37 C) 98.1 F (36.7 C) 98 F (36.7 C)  TempSrc:    Oral  SpO2: 91% 93% 90% 94%  Weight:  76.4 kg   Height:       Filed Weights   10/24/21 0448 10/25/21 0500 10/27/21 0452  Weight: 74.8 kg 76.5 kg 76.4 kg   Exam: General exam: Appears uncomfortable  HEENT: PERRLA, oral mucosa moist, no sclera icterus or thrush Respiratory system: Clear to auscultation. Respiratory effort normal. Cardiovascular system: S1 & S2 heard, regular rate and rhythm Gastrointestinal system: Abdomen soft, ongoing tenderness in right abdomen, nondistended. Normal bowel sounds   MSK: Right-sided flank tenderness-drain is present with yellow discharge Central nervous system: Alert and oriented. No focal neurological deficits. Extremities: No  cyanosis, clubbing or edema Skin: No rashes or ulcers Psychiatry:  Mood & affect appropriate.    Imaging and lab data was personally reviewed    CBC: Recent Labs  Lab 10/21/21 0258 10/22/21 0301 10/22/21 1106 10/24/21 1738 10/27/21 1045  WBC 13.1* 13.0* 13.4* 13.6* 12.6*  HGB 8.0* 6.8* 7.5* 7.9* 7.9*  HCT 24.0* 20.3* 23.0* 25.0* 23.8*  MCV 96.4 95.3 96.2 95.8 94.4  PLT 733* 682* 714* 772* 772*    Basic Metabolic Panel: Recent Labs  Lab 10/21/21 0258 10/22/21 0301 10/24/21 1738  NA 133* 135 134*  K 4.5 4.4 4.6  CL 93* 95* 95*  CO2 '28 27 30  '$ GLUCOSE 199* 337* 148*  BUN '14 15 15  '$ CREATININE 1.06* 1.30* 1.30*  CALCIUM 8.7* 8.5* 8.7*    GFR: Estimated Creatinine Clearance: 46.8 mL/min (A) (by C-G formula based on SCr of 1.3 mg/dL (H)).  Scheduled Meds:  sodium chloride   Intravenous Once   Chlorhexidine Gluconate Cloth  6 each Topical Daily   dextrose  1 ampule Intravenous Once   DULoxetine  60 mg Oral QHS   gabapentin  300 mg Oral TID   insulin aspart  0-15 Units Subcutaneous TID WC   insulin aspart  0-5 Units Subcutaneous QHS   insulin aspart  10 Units Subcutaneous TID WC   insulin glargine-yfgn  24 Units Subcutaneous BID   methocarbamol  500 mg Oral TID   multivitamin with minerals  1 tablet Oral Daily   pantoprazole  40 mg Oral Daily   senna-docusate  1 tablet Oral BID   simvastatin  40 mg Oral QHS   sodium chloride flush  10-40 mL Intracatheter Q12H   sodium chloride flush  3 mL Intravenous Q12H   Continuous Infusions:  sodium chloride Stopped (10/05/21 0517)   cefTRIAXone (ROCEPHIN)  IV 2 g (10/26/21 1841)     LOS: 22 days   Author: Debbe Odea  10/27/2021 3:41 PM

## 2021-10-27 NOTE — Progress Notes (Signed)
Physical Therapy Treatment Patient Details Name: [name] MRN: [id number] DOB: 03-10-1966 Today's Date: 10/27/2021   History of Present Illness Pt is a 56 y.o. female admitted 10/04/21 with c/o SOB, chest pain, N/V/D, loss of appetite. 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 S/p R perinephric aspiration 6/26, 7/7. Rt perinephric drain placed 7/7 PMH includes CAD, DM, fibromyalgia, neuropathy, ischemic cardiomyopathy, sinus bradycardia, NASH.    PT Comments    Patient agreed to activity despite 8/10 back pain and feeling woozy from pain meds with muscle relaxer. She continues with blurred vision (although improving) and was able to ambulate 500 ft with RW. Will continue to try to progress to modified independent vs independent with gait.     Recommendations for follow up therapy are one component of a multi-disciplinary discharge planning process, led by the attending physician.  Recommendations may be updated based on patient status, additional functional criteria and insurance authorization.  Follow Up Recommendations  Home health PT     Assistance Recommended at Discharge Intermittent Supervision/Assistance  Patient can return home with the following A little help with walking and/or transfers;A little help with bathing/dressing/bathroom;Assistance with cooking/housework;Assist for transportation;Help with stairs or ramp for entrance   Equipment Recommendations  Rolling walker (2 wheels)    Recommendations for Other Services       Precautions / Restrictions Precautions Precautions: Fall;Other (comment) Precaution Comments: JP bulb drain Restrictions Weight Bearing Restrictions: No     Mobility  Bed Mobility Overal bed mobility: Needs Assistance Bed Mobility: Sit to Supine     Supine to sit: Modified independent (Device/Increase time)     General bed mobility comments:  no cues needed; pt wanted to remain sitting at EOB at end of session    Transfers Overall transfer level: Needs assistance Equipment used: Rolling walker (2 wheels) Transfers: Sit to/from Stand Sit to Stand: Supervision           General transfer comment: vc for safest use of RW    Ambulation/Gait Ambulation/Gait assistance: Min guard Gait Distance (Feet): 500 Feet Assistive device: Rolling walker (2 wheels) Gait Pattern/deviations: Step-through pattern, Decreased stride length, Trunk flexed Gait velocity: Decreased     General Gait Details: Slow, fatigued gait with RW; continues with blurry vision (although improving) and did not want to attempt walking without device (also due to severe back pain)   Stairs             Wheelchair Mobility    Modified Rankin (Stroke Patients Only)       Balance Overall balance assessment: Needs assistance Sitting-balance support: No upper extremity supported, Feet unsupported Sitting balance-Leahy Scale: Good     Standing balance support: During functional activity, Reliant on assistive device for balance Standing balance-Leahy Scale: Poor Standing balance comment: requires UE support for balance                            Cognition Arousal/Alertness: Awake/alert Behavior During Therapy: Flat affect Overall Cognitive Status: Within Functional Limits for tasks assessed                                 General Comments: feeling woozy from pain meds and muscle relaxer        Exercises      General Comments        Pertinent Vitals/Pain  Pain Assessment Pain Assessment: 0-10 Pain Score: 8  Pain Location: back, Pain Descriptors / Indicators: Sore, Tiring Pain Intervention(s): Limited activity within patient's tolerance, Monitored during session, Premedicated before session    Home Living                          Prior Function            PT Goals (current goals can now be  found in the care plan section) Acute Rehab PT Goals Patient Stated Goal: return home PT Goal Formulation: With patient/family Time For Goal Achievement: 11/10/21 Potential to Achieve Goals: Good Progress towards PT goals: Goals met and updated - see care plan    Frequency    Min 3X/week      PT Plan Current plan remains appropriate    Co-evaluation              AM-PAC PT "6 Clicks" Mobility   Outcome Measure  Help needed turning from your back to your side while in a flat bed without using bedrails?: None Help needed moving from lying on your back to sitting on the side of a flat bed without using bedrails?: A Little Help needed moving to and from a bed to a chair (including a wheelchair)?: A Little Help needed standing up from a chair using your arms (e.g., wheelchair or bedside chair)?: A Little Help needed to walk in hospital room?: A Little Help needed climbing 3-5 steps with a railing? : A Little 6 Click Score: 19    End of Session Equipment Utilized During Treatment: Gait belt Activity Tolerance: Patient limited by fatigue;Patient limited by pain (dizziness and blurred vision) Patient left: with call bell/phone within reach;in bed Nurse Communication: Mobility status (dizziness and blurred vision) PT Visit Diagnosis: Other abnormalities of gait and mobility (R26.89);Muscle weakness (generalized) (M62.81);Pain Pain - part of body:  (back)     Time: 1657-9038 PT Time Calculation (min) (ACUTE ONLY): 17 min  Charges:  $Gait Training: 8-22 mins                      [place]  Office [phone]    Rexanne Mano 10/27/2021, 12:37 PM

## 2021-10-27 NOTE — Progress Notes (Signed)
Occupational Therapy Treatment and Discharge Patient Details Name: [name] MRN: [id number] DOB: 1965-08-05 Today's Date: 10/27/2021   History of present illness Pt is a 56 y.o. female admitted 10/04/21 with c/o SOB, chest pain, N/V/D, loss of appetite. 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 S/p R perinephric aspiration 6/26, 7/7. Rt perinephric drain placed 7/7 PMH includes CAD, DM, fibromyalgia, neuropathy, ischemic cardiomyopathy, sinus bradycardia, NASH.   OT comments  Pt is modified independent in self care and has been routinely taking herself to the bathroom without AD. Encouraged continued ambulation with staff in hall. Pt continues to need a 3 in 1 for home. No further OT needs.    Recommendations for follow up therapy are one component of a multi-disciplinary discharge planning process, led by the attending physician.  Recommendations may be updated based on patient status, additional functional criteria and insurance authorization.    Follow Up Recommendations  No OT follow up    Assistance Recommended at Discharge Intermittent Supervision/Assistance  Patient can return home with the following  Assistance with cooking/housework;Assist for transportation   Equipment Recommendations  BSC/3in1    Recommendations for Other Services      Precautions / Restrictions Precautions Precautions: Fall Precaution Comments: JP bulb drain Restrictions Weight Bearing Restrictions: No       Mobility Bed Mobility Overal bed mobility: Independent                  Transfers Overall transfer level: Modified independent Equipment used: None               General transfer comment: modified independent from bed and 3 in 1 over toilet     Balance Overall balance assessment: Needs assistance Sitting-balance support: No upper extremity supported, Feet  unsupported Sitting balance-Leahy Scale: Good Sitting balance - Comments: can cross foot over opposite knee to don socks     Standing balance-Leahy Scale: Fair                             ADL either performed or assessed with clinical judgement   ADL Overall ADL's : Modified independent                             Toileting- Clothing Manipulation and Hygiene: Modified independent Toileting - Clothing Manipulation Details (indicate cue type and reason): needs 3 in 1     Functional mobility during ADLs: Modified independent General ADL Comments: pt has been routinely walking about her room independently and using RW for hallway ambulation    Extremity/Trunk Assessment              Vision       Perception     Praxis      Cognition Arousal/Alertness: Awake/alert Behavior During Therapy: WFL for tasks assessed/performed Overall Cognitive Status: Within Functional Limits for tasks assessed                                          Exercises      Shoulder Instructions       General Comments      Pertinent Vitals/ Pain       Pain Assessment Pain Assessment: Faces Faces Pain Scale: Hurts a little bit Pain Location: back,  Pain Descriptors / Indicators: Sore, Tiring Pain Intervention(s): Monitored during session  Home Living                                          Prior Functioning/Environment              Frequency           Progress Toward Goals  OT Goals(current goals can now be found in the care plan section)  Progress towards OT goals: Goals met/education completed, patient discharged from [facility] Discharge plan needs to be updated;All goals met and education completed, patient discharged from OT services    Co-evaluation                 AM-PAC OT "6 Clicks" Daily Activity     Outcome Measure   Help from another person eating meals?: None Help from another person  taking care of personal grooming?: None Help from another person toileting, which includes using toliet, bedpan, or urinal?: None Help from another person bathing (including washing, rinsing, drying)?: None Help from another person to put on and taking off regular upper body clothing?: None Help from another person to put on and taking off regular lower body clothing?: None 6 Click Score: 24    End of Session    OT Visit Diagnosis: Muscle weakness (generalized) (M62.81)   Activity Tolerance Patient tolerated treatment well   Patient Left in bed;with call bell/phone within reach   Nurse Communication          Time: 7209-1068 OT Time Calculation (min): 20 min  Charges: OT General Charges $OT Visit: 1 Visit OT Treatments $Self Care/Home Management : 8-22 mins  Cleta Alberts, OTR/L Acute Rehabilitation Services Office: [phone]   Malka So 10/27/2021, 2:15 PM

## 2021-10-28 ENCOUNTER — Other Ambulatory Visit (HOSPITAL_COMMUNITY): Payer: Self-pay

## 2021-10-28 DIAGNOSIS — E872 Acidosis, unspecified: Secondary | ICD-10-CM | POA: Diagnosis not present

## 2021-10-28 DIAGNOSIS — I5021 Acute systolic (congestive) heart failure: Secondary | ICD-10-CM

## 2021-10-28 DIAGNOSIS — E669 Obesity, unspecified: Secondary | ICD-10-CM

## 2021-10-28 DIAGNOSIS — B999 Unspecified infectious disease: Secondary | ICD-10-CM

## 2021-10-28 DIAGNOSIS — N12 Tubulo-interstitial nephritis, not specified as acute or chronic: Secondary | ICD-10-CM | POA: Diagnosis not present

## 2021-10-28 DIAGNOSIS — N179 Acute kidney failure, unspecified: Secondary | ICD-10-CM | POA: Diagnosis not present

## 2021-10-28 DIAGNOSIS — E8809 Other disorders of plasma-protein metabolism, not elsewhere classified: Secondary | ICD-10-CM

## 2021-10-28 DIAGNOSIS — E1169 Type 2 diabetes mellitus with other specified complication: Secondary | ICD-10-CM

## 2021-10-28 DIAGNOSIS — D638 Anemia in other chronic diseases classified elsewhere: Secondary | ICD-10-CM

## 2021-10-28 DIAGNOSIS — Q8901 Asplenia (congenital): Secondary | ICD-10-CM

## 2021-10-28 DIAGNOSIS — E1165 Type 2 diabetes mellitus with hyperglycemia: Secondary | ICD-10-CM

## 2021-10-28 LAB — GLUCOSE, CAPILLARY
Glucose-Capillary: 223 mg/dL — ABNORMAL HIGH (ref 70–99)
Glucose-Capillary: 148 mg/dL — ABNORMAL HIGH (ref 70–99)
Glucose-Capillary: 136 mg/dL — ABNORMAL HIGH (ref 70–99)

## 2021-10-28 LAB — AEROBIC/ANAEROBIC CULTURE W GRAM STAIN (SURGICAL/DEEP WOUND): Culture: NO GROWTH

## 2021-10-28 MED ORDER — CEFADROXIL 500 MG PO CAPS
500.0000 mg | ORAL_CAPSULE | Freq: Two times a day (BID) | ORAL | Status: DC
  Administered 2021-10-28: 500 mg via ORAL
  Filled 2021-10-28 (×2): qty 1

## 2021-10-28 MED ORDER — ADULT MULTIVITAMIN W/MINERALS CH
1.0000 | ORAL_TABLET | Freq: Every day | ORAL | 0 refills | Status: AC
  Filled 2021-10-28: qty 30, 30d supply, fill #0

## 2021-10-28 MED ORDER — TRAMADOL HCL 50 MG PO TABS
50.0000 mg | ORAL_TABLET | Freq: Three times a day (TID) | ORAL | 0 refills | Status: AC | PRN
  Filled 2021-10-28: qty 60, 20d supply, fill #0

## 2021-10-28 MED ORDER — CEFADROXIL 500 MG PO CAPS
500.0000 mg | ORAL_CAPSULE | Freq: Two times a day (BID) | ORAL | 0 refills | Status: DC
  Filled 2021-10-28: qty 30, 15d supply, fill #0

## 2021-10-28 MED ORDER — SODIUM CHLORIDE FLUSH 0.9 % IV SOLN
5.0000 mL | Freq: Every day | INTRAVENOUS | 0 refills | Status: AC
  Filled 2021-10-28: qty 200, 20d supply, fill #0

## 2021-10-28 MED ORDER — NOVOLOG FLEXPEN 100 UNIT/ML ~~LOC~~ SOPN: 1.0000 [IU] | PEN_INJECTOR | Freq: Three times a day (TID) | SUBCUTANEOUS | 11 refills | Status: AC

## 2021-10-28 MED ORDER — BASAGLAR KWIKPEN 100 UNIT/ML ~~LOC~~ SOPN: 35.0000 [IU] | PEN_INJECTOR | Freq: Every day | SUBCUTANEOUS | Status: DC

## 2021-10-28 MED ORDER — METHOCARBAMOL 500 MG PO TABS
500.0000 mg | ORAL_TABLET | Freq: Three times a day (TID) | ORAL | 0 refills | Status: AC | PRN
  Filled 2021-10-28: qty 90, 30d supply, fill #0

## 2021-10-28 MED ORDER — ACETAMINOPHEN 325 MG PO TABS: 650.0000 mg | ORAL_TABLET | Freq: Four times a day (QID) | ORAL | Status: AC | PRN

## 2021-10-28 MED ORDER — SENNOSIDES-DOCUSATE SODIUM 8.6-50 MG PO TABS
1.0000 | ORAL_TABLET | Freq: Two times a day (BID) | ORAL | 0 refills | Status: AC
  Filled 2021-10-28: qty 60, 30d supply, fill #0

## 2021-10-28 MED ORDER — PANTOPRAZOLE SODIUM 40 MG PO TBEC
40.0000 mg | DELAYED_RELEASE_TABLET | Freq: Every day | ORAL | 0 refills | Status: AC
  Filled 2021-10-28: qty 30, 30d supply, fill #0

## 2021-10-28 MED ORDER — GABAPENTIN 300 MG PO CAPS
300.0000 mg | ORAL_CAPSULE | Freq: Three times a day (TID) | ORAL | 0 refills | Status: DC
  Filled 2021-10-28: qty 90, 30d supply, fill #0

## 2021-10-28 MED ORDER — POLYETHYLENE GLYCOL 3350 17 GM/SCOOP PO POWD
17.0000 g | Freq: Every day | ORAL | 0 refills | Status: DC | PRN
  Filled 2021-10-28: qty 238, 14d supply, fill #0

## 2021-10-28 NOTE — Progress Notes (Signed)
Patient shows no acute distress or pain, discharge instruction provided with AVS and demonstration of how to manage JP including tube flush, emptying, and measuring. Patient tearful. Encouraged the patient and shows good understanding. Awaiting for midline to be removed, delivery of BSC and RW, and discharge medicine.

## 2021-10-28 NOTE — TOC Transition Note (Signed)
Transition of Care [facility]) - CM/SW Discharge Note   Patient Details  Name: [name] MRN: [id number] Date of Birth: [birth year]  [facility]) CM/SW Contact:  Tom-Johnson, Renea Ee, RN Phone Number: 10/28/2021, 10:29 AM   Clinical Narrative:     Patient is scheduled for discharge today. Discharging home on Oral Abx. RW and BSC ordered from [employer] and [place] to deliver at bedside. Home Health info on AVS. Boyfriend to transport at discharge. No further TOC needs noted.   Final next level of care: [place] Barriers to Discharge: Barriers Resolved   Patient Goals and CMS Choice Patient states their goals for this hospitalization and ongoing recovery are:: To return home CMS Medicare.gov Compare Post Acute Care list provided to:: Patient Choice offered to / list presented to : Patient  Discharge Placement                Patient to be transferred to facility by: Boyfriend      Discharge Plan and Services   Discharge Planning Services: CM Consult Post Acute Care Choice: Home Health          DME Arranged: 3-N-1, Walker rolling DME Agency: [employer] Date DME Agency Contacted: 10/28/21 Time DME Agency Contacted: [phone] Representative spoke with at DME Agency: Jodell Cipro HH Arranged: PT, RN [place] Agency: [place] ([place]) Date [place]: 10/10/21 Time [place]: 1034 Representative spoke with at [place]: Caryl Pina , will need orders  Social Determinants of Health (SDOH) Interventions     Readmission Risk Interventions    10/28/2021   10:28 AM  Readmission Risk Prevention Plan  Post Dischage Appt Complete  Medication Screening Complete  Transportation Screening Complete

## 2021-10-28 NOTE — Discharge Instructions (Addendum)
Please review all of you discharge paperwork on the day of discharge and be sure you have all of your prescribed medications. Flush drain with 5cc 3 times a day. Change dressing daily.  Please check your sugar 3 times a day prior to meals and at bedtime and contact your physician if persistently above 250.  Please request your Primary MD to go over all Hospital Tests and Procedure/Radiological results at the follow up Please get all Hospital records sent to your primary MD by signing hospital release before you go home.   In some cases, there will be blood work, cultures and biopsy results pending at the time of your discharge. Please request that your primary care M.D. goes through all the records of your hospital data and follows up on these results.  Please take all your medications with you for your next visit with your Primary MD   Please request your Primary MD to go over all hospital tests and procedure/radiological results at the follow up, please ask your Primary MD to get all Hospital records sent to his/her office.   You must read complete instructions/literature along with all the possible adverse reactions/side effects for all the Medicines you take and that have been prescribed to you. Take any new Medicines after you have completely understood and accpet all the possible adverse reactions/side effects.    Do not drive or operate heavy machinery when taking Pain medications.    Do not take more than prescribed Pain, Sleep and Anxiety Medications  If you have smoked or chewed Tobacco  in the last 2 yrs please stop smoking, stop any regular Alcohol  and or any Recreational drug use.   Wear Seat belts while driving.   If you had Pneumonia or Lung problems at the Hospital: Please get a 2 view Chest X ray done in 6-8 weeks after hospital discharge or sooner if instructed by your Primary MD.   If you have Congestive Heart Failure: Please call your Cardiologist or Primary MD anytime  you have any of the following symptoms:  1) 3 pound weight gain in 24 hours or 5 pounds in 1 week  2) shortness of breath, with or without a dry hacking cough  3) swelling in the hands, feet or stomach  4) if you have to sleep on extra pillows at night in order to breathe 5) Follow cardiac low salt diet and 1.5 lit/day fluid restriction.   If you have Diabetes Accuchecks 4 times/day- once on AM empty stomach and then before each meal. Log in all results and show them to your primary doctor at your next visit. If any glucose reading is under 60 or above 400 call your primary MD immediately.   If you have Seizure/Convulsions/Epilepsy: Please do not drive, operate heavy machinery, participate in activities at heights or participate in high speed sports until you have seen by Primary MD or a Neurologist and advised to do so again. Per [facility] statutes, patients with seizures are not allowed to drive until they have been seizure-free for six months.  Use caution when using heavy equipment or power tools. Avoid working on ladders or at heights. Take showers instead of baths. Ensure the water temperature is not too high on the home water heater. Do not go swimming alone. Do not lock yourself in a room alone (i.e. bathroom). When caring for infants or small children, sit down when holding, feeding, or changing them to minimize risk of injury to the child in  the event you have a seizure. Maintain good sleep hygiene. Avoid alcohol.    If you had Gastrointestinal Bleeding: Please ask your Primary MD to check a complete blood count within one week of discharge or at your next visit. Your endoscopic/colonoscopic biopsies that are pending at the time of discharge, will also need to followed by your Primary MD.  Please note You were cared for by a hospitalist during your hospital stay. If you have any questions about your discharge medications or the care you received while you were in the hospital  after you are discharged, you can call the unit and asked to speak with the hospitalist on call if the hospitalist that took care of you is not available. Once you are discharged, your primary care physician will handle any further medical issues. Please note that NO REFILLS for any discharge medications will be authorized once you are discharged, as it is imperative that you return to your primary care physician (or establish a relationship with a primary care physician if you do not have one) for your aftercare needs so that they can reassess your need for medications and monitor your lab values.   You can reach the hospitalist office at phone [phone] or fax [phone]   If you do not have a primary care physician, you can call [phone] for a physician referral.

## 2021-10-28 NOTE — Discharge Summary (Signed)
Physician Discharge Summary  [name]:[id number] DOB: 1965-05-27 DOA: 10/04/2021  PCP: [name] [facility] Dept Personal  Admit date: 10/04/2021 Discharge date: 10/28/2021 Discharging to: home Recommendations for Outpatient Follow-up:  Encourage adherence to a diabetic diet and f/u diabetes control closely Bmet and CBC in 1 wk please Please repeat ECHO in 1 month  Consults:  ID, Urology, IR Procedures:  On 6/26, she underwent a R perinephric aspiration by IR 7/2 right perinephric drain placement by IR   Discharge Diagnoses:   Principal Problem:   Emphysematous pyelitis Active Problems:   E coli bacteremia   Asplenia   CAD (coronary artery disease)   Thrombocytopenia (HCC)   AKI (acute kidney injury) ([name])   Elevated troponin   Acidosis   Cirrhosis ([place])   Septic shock ([place])   Acute respiratory failure with hypoxia ([place])   Uncontrolled type 2 diabetes mellitus with hyperglycemia, with long-term current use of insulin (HCC)   Obesity (BMI 30-39.9)   Anemia of infection and chronic disease   Hypoalbuminemia   Acute HFrEF (heart failure with reduced ejection fraction) [facility])     Hospital Course: This is a 56 year old with insulin requiring diabetes mellitus, hypertension, [name], history of STEMI who presented to the hospital for nausea vomiting and was found to be in septic shock related to emphysematous pyelonephritis.  She was started on ceftriaxone.  Eventually she was weaned off of vasopressors.  Urine and blood culture revealed E. coli. On 6/18, CT revealed gas collections in the right kidney On 6/26, she underwent a perinephric aspiration Hospital course was complicated by acute respiratory failure requiring BiPAP (admitted to be secondary to acute pulmonary edema)    Principal Problem:   Emphysematous pyelitis,  E coli pyelonephritis and bacteremia with Septic shock   -Has been receiving ceftriaxone via mid line - 7/6 Repeat CT revealed areas of  renal abscesses in the right and the left - ID recommended an IR consult to see if these areas can be aspirated- IR aspirated 2 cc of pus on 7/6 and placed a drain -   abundant WBC and no organisms seen on gram stain- culture NTD -ID recommends changed to cefadroxil 5 twice daily daily until 7/26 - Dr. Candiss Norse will follow-up in the office   Active Problems: Blurred vision, dizziness, weakness of left side on 7/7 - sudden onset blurred vision and dizziness t dizziness felt like vertigo had very mild weakness in the left arm on exam - stat glucose 281 - stat MRI ordered and negative - vision slightly better but not resolving - dizziness and weakness resolved -consulted Dr [name] (opthal)-she feels blurred vision may be related to elevated sugars-patient also states that it started the day that she received IV contrast dye (7/6) and this was the second time she had blurred vision after IV contrast - Vision is no longer blurry today    Acute respiratory failure with hypoxia ([place])- resolved  - with acute pulmonary edema - new finding of EF 40-45% - required Bipap  - resolved - cont Lisinopril -Have not started SGLT2i due to pyelonephritis     AKI (acute kidney injury) ([place])- unspecified if CKD - presented with Cr of 3.07- ? 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  Hypoalbuminemia  - due to acute infection and poor oral intake. - oral intake has improved- albumin should hopefully improve with resolution of infection       Discharge Instructions  Discharge Instructions     Diet - low sodium heart healthy   Complete by: As directed    Diet Carb Modified   Complete by: As directed    Increase activity slowly   Complete by: As directed       Allergies as of 10/28/2021       Reactions   Aspartame And Phenylalanine Anaphylaxis, Shortness Of Breath, Swelling   Throat swells  ANY ARTIFICAL SWEETNERS   Toradol [ketorolac Tromethamine] Other (See Comments)   Face & neck flushed red & pt felt very hot after IV adm.   Sucralose Rash   Pt.s face broke out in rash after drinking Breeza.  Pt. Claims it is from the artificial sweetener.     Codeine Swelling   Dilaudid [hydromorphone Hcl] Itching   Hydrocodone Itching   Oxycodone Itching        Medication List     STOP taking these medications    dexlansoprazole 60 MG capsule Commonly known as: DEXILANT Replaced by: pantoprazole 40 MG tablet   famotidine 20 MG tablet Commonly known as: PEPCID   gabapentin 600 MG tablet Commonly known as: NEURONTIN Replaced by: gabapentin 300 MG capsule   glimepiride 4 MG tablet Commonly known as: AMARYL   HYDROcodone-acetaminophen 7.5-325 MG tablet Commonly known as: NORCO   ibuprofen 200 MG tablet Commonly known as: ADVIL   ibuprofen 800 MG tablet Commonly known as: ADVIL       TAKE these medications    acetaminophen 325 MG tablet Commonly known as: TYLENOL Take 2 tablets (650 mg total) by mouth every 6 (six) hours as  needed for mild pain (or Fever >/= 101).   albuterol 108 (90 Base) MCG/ACT inhaler Commonly known as: VENTOLIN HFA Inhale 2 puffs into the lungs every 4 (four) hours as needed for shortness of breath or wheezing.   Basaglar KwikPen 100 UNIT/ML Inject 35 Units into the skin at bedtime. What changed: how much to take   BD PosiFlush 0.9 % Soln injection Generic drug: sodium chloride flush Use 5 mLs by Intracatheter route daily for 20 days.   cefadroxil 500 MG capsule Commonly known as: DURICEF Take 1 capsule (500 mg total) by mouth 2 (two) times daily for 15 days.   CertaVite/Antioxidants Tabs Take 1 tablet by mouth daily. Start taking on: October 29, 2021   DULoxetine 60 MG capsule Commonly known as: CYMBALTA Take 60 mg by mouth at bedtime.  gabapentin 300 MG capsule Commonly known as: NEURONTIN Take 1 capsule (300 mg total) by mouth 3 (three) times daily. Replaces: gabapentin 600 MG tablet   lisinopril 10 MG tablet Commonly known as: ZESTRIL Take 10 mg by mouth daily.   metFORMIN 1000 MG tablet Commonly known as: GLUCOPHAGE Take 1,000 mg by mouth 2 (two) times daily.   methocarbamol 500 MG tablet Commonly known as: ROBAXIN Take 1 tablet (500 mg total) by mouth every 8 (eight) hours as needed for muscle spasms.   NovoLOG FlexPen 100 UNIT/ML FlexPen Generic drug: insulin aspart Inject 1-15 Units into the skin 3 (three) times daily with meals. CBG 121 - 150: 2 units  CBG 151 - 200: 3 units  CBG 201 - 250: 5 units  CBG 251 - 300: 8 units  CBG 301 - 350: 11 units  CBG 351 - 400: 15 units   pantoprazole 40 MG tablet Commonly known as: PROTONIX Take 1 tablet (40 mg total) by mouth daily. Start taking on: October 29, 2021 Replaces: dexlansoprazole 60 MG capsule   polyethylene glycol powder 17 GM/SCOOP powder Commonly known as: GLYCOLAX/MIRALAX Take 17 g by mouth daily as needed for moderate constipation.   PROBIOTIC PO Take 1 capsule by mouth daily.   Senexon-S  8.6-50 MG tablet Generic drug: senna-docusate Take 1 tablet by mouth 2 (two) times daily.   simvastatin 40 MG tablet Commonly known as: ZOCOR Take 40 mg by mouth at bedtime.   traMADol 50 MG tablet Commonly known as: ULTRAM Take 1 tablet (50 mg total) by mouth every 8 (eight) hours as needed for up to 21 days for severe pain.   Vitamin D3 1.25 MG (50000 UT) Caps Take 50,000 Units by mouth every 'Sunday.               Durable Medical Equipment  (From admission, onward)           Start     Ordered   10/28/21 1027  For home use only DME 3 n 1  Once        10/28/21 1026   10/28/21 1027  For home use only DME Walker rolling  Once       Question Answer Comment  Walker: With 5 Inch Wheels   Patient needs a walker to treat with the following condition Gait instability      07'$ /11/23 [place], [place] Follow up.   Contact information: [address] [place] [address] [place] Follow up.   Specialty: Radiology Why: department will call you to give you an appt Contact information: [address] [id number] [place]        Laurice Record, MD Follow up.   Specialty: Infectious Diseases Why: Her office will call you to give you the appt Contact information: [address] [place] [address] [phone]         Lucas Mallow, MD Follow up.   Specialty: Urology Contact information: [place] [id number] [place], [facility] Dept Personal Follow up in 1 week(s).   Contact information: [address] [place] [address] [phone]         Health, [facility]  Dept Personal Follow up.   Why: Scheduled hosp f/u is on Friday 10/31/21 at 1:30 pm. Contact information: [address] [place] [address] [phone]                    The results of significant diagnostics from this hospitalization (including imaging, microbiology, ancillary and laboratory) are listed below for reference.    MR BRAIN WO CONTRAST  Result Date: 10/24/2021 CLINICAL DATA:  Dizziness, persistent/recurrent, cardiac or vascular cause suspected left arm weakness, dizziness, blurred vision EXAM: MRI HEAD WITHOUT CONTRAST TECHNIQUE: Multiplanar, multiecho pulse sequences of the brain and surrounding structures were obtained without intravenous contrast. COMPARISON:  2014 FINDINGS: Brain: There is no acute infarction or intracranial hemorrhage. There is no intracranial mass, mass effect, or edema. There is no hydrocephalus or extra-axial fluid collection. Ventricles and sulci are normal in size and configuration. Vascular: Major vessel flow voids at the skull base are preserved. Skull and upper cervical spine: Normal marrow signal is preserved. Sinuses/Orbits: Paranasal sinuses are aerated. Orbits are unremarkable. Other: Sella is unremarkable. Patchy bilateral mastoid fluid opacification. IMPRESSION: No acute infarction, hemorrhage, or mass. Electronically Signed   By: Macy Mis M.D.   On: 10/24/2021 16:24   CT IMAGE GUIDED DRAINAGE BY PERCUTANEOUS CATHETER  Result Date: 10/23/2021 INDICATION: Multifocal renal abscesses EXAM: CT GUIDED DRAINAGE OF RIGHT RENAL ABSCESS CT GUIDED ASPIRATION OF RIGHT RENAL ABSCESS TECHNIQUE: Multidetector CT imaging of the abdomen was performed following the standard protocol without IV contrast. RADIATION DOSE REDUCTION: This exam was performed according to the departmental dose-optimization program which includes automated exposure control, adjustment of the mA and/or kV according to patient size and/or use of iterative reconstruction technique. MEDICATIONS: The patient is currently admitted to the hospital and receiving intravenous antibiotics. The  antibiotics were administered within an appropriate time frame prior to the initiation of the procedure. ANESTHESIA/SEDATION: Moderate (conscious) sedation was employed during this procedure. A total of Versed 2 mg and Fentanyl 100 mcg was administered intravenously by the radiology nurse. Total intra-service moderate Sedation Time: 20 minutes. The patient's level of consciousness and vital signs were monitored continuously by radiology nursing throughout the procedure under my direct supervision. COMPLICATIONS: None immediate. TECHNIQUE: Informed written consent was obtained from the patient after a thorough discussion of the procedural risks, benefits and alternatives. All questions were addressed. Maximal Sterile Barrier Technique was utilized including caps, mask, sterile gowns, sterile gloves, sterile drape, hand hygiene and skin antiseptic. A timeout was performed prior to the initiation of the procedure. PROCEDURE: The operative field was prepped with Chlorhexidine in a sterile fashion, and a sterile drape was applied covering the operative field. A sterile gown and sterile gloves were used for the procedure. Local anesthesia was provided with 1% Lidocaine. ASPIRATION The smaller abscess at the lateral margin of the right kidney was identified and an appropriate skin entry site selected and marked. Local anesthesia was attained by infiltration with 1% lidocaine. Under intermittent CT guidance, a 22 gauge spinal needle was carefully advanced into the fluid collection. Aspiration was then performed yielding approximately 2 mL of purulent fluid. This was sent for Gram stain and culture. The needle was removed. DRAIN PLACEMENT The larger presumed abscess at the medial margin of the right kidney was identified an appropriate skin entry site selected and marked. Local anesthesia was attained by infiltration with 1% lidocaine. Under intermittent CT guidance, an 18 gauge spinal needle was carefully advanced into the  fluid collection. A 0.035 wire  was then coiled in the fluid collection. The percutaneous tract was dilated to 10 [place]. A Cook 10 [place] all-purpose drainage catheter was advanced over the wire and formed. Aspiration yields approximately 7 mL of thick purulent fluid. This was not sent for culture as this particular abscess had previously been aspirated and cultured with a positive diagnosis of E coli. The drain was connected to JP bulb suction and secured to the skin with 0 Prolene suture. FINDINGS: Lateral right renal fluid collection yielding 2 mL purulent fluid. Medial right renal fluid collection yielding 7 mL purulent fluid. IMPRESSION: 1. Successful aspiration of more lateral and smaller right renal abscess. Sample sent for culture. 2. Successful placement of 10 French drain into the medial right renal abscess. PLAN: Renal abscess drain to JP bulb suction. Do not flush drainage catheter. Electronically Signed   By: Jacqulynn Cadet M.D.   On: 10/23/2021 13:45   CT ABDOMEN PELVIS W CONTRAST  Result Date: 10/21/2021 CLINICAL DATA:  Severe pyelonephritis. Status post CT-guided right perinephric abscess aspiration. EXAM: CT ABDOMEN AND PELVIS WITH CONTRAST TECHNIQUE: Multidetector CT imaging of the abdomen and pelvis was performed using the standard protocol following bolus administration of intravenous contrast. RADIATION DOSE REDUCTION: This exam was performed according to the departmental dose-optimization program which includes automated exposure control, adjustment of the mA and/or kV according to patient size and/or use of iterative reconstruction technique. CONTRAST:  136m OMNIPAQUE IOHEXOL 300 MG/ML  SOLN COMPARISON:  10/11/2021 FINDINGS: Lower chest: Small to moderate right pleural effusion with overlying atelectasis is mildly increased in volume from previous exam. Subpleural atelectasis noted in the left base. Hepatobiliary: No focal liver abnormality is seen. No gallstones, gallbladder wall  thickening, or biliary dilatation. Pancreas: Unremarkable. No pancreatic ductal dilatation or surrounding inflammatory changes. Spleen: Status post splenectomy. Adrenals/Urinary Tract: Normal adrenal glands. Right-sided perinephric fat stranding is again noted. Multiple areas of fluid attenuation within the right kidney are again identified. Compared with the previous exam there is been interval resolution gas within these fluid density areas. The largest arises off the medial aspect of the upper pole of right kidney measuring 3.9 x 3.5 cm, image 27/3. Formally this measured 4.1 x 2.9 cm. Fluid collection along the lateral aspect of the upper pole of right kidney measures 2.8 x 1.7 cm, image 27/3. Formally 2.8 x 2.0 cm. Fluid collection overlying the upper pole of the right kidney measures 2.9 by 1.9 cm, image 93/6. Previously 2.4 x 1.6 cm. Additional smaller areas of fluid density within the right kidney are unchanged. Striated nephrographic appearance of the left kidney is noted. Large area of lobar nephronia arising off the lateral cortex of the interpolar left kidney is improved in the interval, image 27/3. Similar appearance of right-sided pelvocaliectasis. No left-sided hydronephrosis. Urinary bladder is decompressed around a Foley catheter balloon. Stomach/Bowel: Stomach appears normal. The appendix is visualized and appears normal. No bowel wall thickening, inflammation, or distension. Vascular/Lymphatic: Aortic atherosclerosis. 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 Multiple prominent upper abdominal lymph nodes are identified likely reflecting reactive adenopathy. Reproductive: Status post hysterectomy. No adnexal masses. Other: No free fluid.  No signs of pneumoperitoneum. Musculoskeletal: Diffuse body wall edema is favored to represent anasarca. No acute or suspicious osseous findings. Degenerative disc disease noted at L5-S1. IMPRESSION: 1. Multifocal fluid attenuating structures  within the right kidney compatible with areas of renal abscesses secondary to emphysematous cystitis. There is been interval resolution of gas within the right kidney fluid collections. Volume of  fluid collections appear stable in the interval. Progressive changes identified. 2. Left-sided pyelonephritis is again noted. Dominant area of lobar nephronia involving the left kidney has improved in the interval. 3. Small to moderate right pleural effusion with overlying atelectasis is mildly increased in volume from previous exam. 4. 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 5. Diffuse body wall edema is favored to represent anasarca. 6. Aortic Atherosclerosis (ICD10-I70.0). Electronically Signed   By: Kerby Moors M.D.   On: 10/21/2021 09:42   DG Shoulder Left  Result Date: 10/15/2021 CLINICAL DATA:  Left shoulder pain EXAM: LEFT SHOULDER - 2+ VIEW COMPARISON:  MRI 11/14/2013 FINDINGS: There is no evidence of fracture or dislocation. There is no evidence of arthropathy or other focal bone abnormality. Soft tissues are unremarkable. IMPRESSION: Negative. Electronically Signed   By: Davina Poke D.O.   On: 10/15/2021 12:01   CT ASPIRATION  Result Date: 10/13/2021 INDICATION: 56 year old female with emphysematous pyelonephritis in small right perirenal fluid collection concerning for abscess. EXAM: CT-guided aspiration MEDICATIONS: The patient is currently admitted to the hospital and receiving intravenous antibiotics. The antibiotics were administered within an appropriate time frame prior to the initiation of the procedure. ANESTHESIA/SEDATION: Moderate (conscious) sedation was employed during this procedure. A total of Versed 1.5 mg and Fentanyl 75 mcg was administered intravenously by the radiology nurse. Total intra-service moderate Sedation Time: 12 minutes. The patient's level of consciousness and vital signs were monitored continuously by radiology nursing throughout the  procedure under my direct supervision. COMPLICATIONS: None immediate. PROCEDURE: Informed written consent was obtained from the patient after a thorough discussion of the procedural risks, benefits and alternatives. All questions were addressed. Maximal Sterile Barrier Technique was utilized including caps, mask, sterile gowns, sterile gloves, sterile drape, hand hygiene and skin antiseptic. A timeout was performed prior to the initiation of the procedure. The patient was position prone on the CT table. Preprocedure limited abdominal CT demonstrated similar appearing changes of emphysematous pyelonephritis about the right kidney with a small perirenal fluid collection containing gas about the medial aspect of the interpolar region. The procedure was planned. The right posterior flank was prepped and draped in standard fashion. Subdermal Local anesthesia was administered at the planned needle entry site with 1% lidocaine. Deeper local anesthetic was administered with a 22 gauge spinal needle under intermittent CT guidance. Next, a 10 cm, 19 gauge Yueh needle was directed into the fluid collection under intermittent CT guidance. The inner needle was removed and aspiration was performed yielding approximately 7 mL of purulent fluid. The sample was sent to the lab for culture. The Yueh catheter was removed. Hemostasis was achieved with brief manual compression. Limited range CT postprocedure demonstrated no evidence of surrounding hematoma or other complicating features. A sterile bandage was applied. The patient tolerated the procedure well was transferred back to the floor in stable condition. IMPRESSION: Technically successful CT-guided right perinephric abscess aspiration. Ruthann Cancer, MD [facility] Radiology Electronically Signed   By: Ruthann Cancer M.D.   On: 10/13/2021 11:54   CT ABDOMEN PELVIS W CONTRAST  Result Date: 10/11/2021 CLINICAL DATA:   Pyelonephritis scattered complicated emphysematous pyelonephritis. EXAM: CT ABDOMEN AND PELVIS WITH CONTRAST TECHNIQUE: Multidetector CT imaging of the abdomen and pelvis was performed using the standard protocol following bolus administration of intravenous contrast. RADIATION DOSE REDUCTION: This exam was performed according to the departmental dose-optimization program which includes automated exposure control, adjustment of the mA and/or kV according to patient size  and/or use of iterative reconstruction technique. CONTRAST:  161m OMNIPAQUE IOHEXOL 300 MG/ML  SOLN COMPARISON:  CT examinations dated October 05, 2021 and October 07, 2021. FINDINGS: Lower chest: Small bilateral pleural effusions, right greater than the left. Hepatobiliary: No focal liver abnormality is seen. No gallstones, gallbladder wall thickening, or biliary dilatation. Pancreas: Unremarkable. No pancreatic ductal dilatation or surrounding inflammatory changes. Spleen: Surgically absent. Adrenals/Urinary Tract: Adrenal glands are unremarkable. Multifocal area of nonenhancement in the right kidney with small foci of air consistent with emphysematous pyelonephritis, there is interval decrease in amount of gas about the lateral and medial pole of the right kidney. Nonenhancing area in the upper and midpole of the left kidney consistent with pyelonephritis. No appreciable emphysema. No perinephric fluid collection or abscess. Urinary bladder is collapsed about the FDigestivecare Inccatheter. Stomach/Bowel: Stomach is within normal limits. Appendix appears normal. No evidence of bowel wall thickening, distention, or inflammatory changes. Vascular/Lymphatic: Mild aortic atherosclerosis. No enlarged abdominal or pelvic lymph nodes. Reproductive: Prostate is unremarkable. Other: No abdominal wall hernia or abnormality. No abdominopelvic ascites. Musculoskeletal: No acute or significant osseous findings. IMPRESSION: 1. Emphysematous pyelonephritis on the right with  interval decrease in amount of gas without evidence of drainable fluid collection or abscess suggesting improving pyelonephritis. Continued follow-up is recommended. 2. Heterogeneous perfusion of the left kidney suggesting pyelonephritis without evidence of air or fluid collection/abscess, suggesting non complicated left pyelonephritis. 3.  Urinary bladder is collapsed about the Foley's catheter. 4.  Additional chronic findings as above. Electronically Signed   By: IKeane PoliceD.O.   On: 10/11/2021 14:31   DG CHEST PORT 1 VIEW  Result Date: 10/09/2021 CLINICAL DATA:  Respiratory failure, hypoxia EXAM: PORTABLE CHEST 1 VIEW COMPARISON:  10/06/2021 FINDINGS: Interval improvement in diffuse bilateral interstitial and heterogeneous airspace disease, which is most conspicuous in the right midlung and left lung base. Cardiomegaly. IMPRESSION: Interval improvement in diffuse bilateral interstitial and heterogeneous airspace disease, most conspicuous in the right midlung and left lung base. Findings are consistent with improved edema and or infection. Electronically Signed   By: ADelanna AhmadiM.D.   On: 10/09/2021 08:34   CT ABDOMEN PELVIS WO CONTRAST  Result Date: 10/07/2021 CLINICAL DATA:  Emphysematous pyelonephritis, follow-up exam EXAM: CT ABDOMEN AND PELVIS WITHOUT CONTRAST TECHNIQUE: Multidetector CT imaging of the abdomen and pelvis was performed following the standard protocol without IV contrast. RADIATION DOSE REDUCTION: This exam was performed according to the departmental dose-optimization program which includes automated exposure control, adjustment of the mA and/or kV according to patient size and/or use of iterative reconstruction technique. COMPARISON:  10/05/2021 FINDINGS: Lower chest: Bilateral pleural effusions are noted right greater than left. Additionally there is considerable increase consolidation in the lower lobes bilaterally as well as the right middle lobe and left lingula.  Hepatobiliary: Gallbladder is within normal limits. Cirrhotic change of the liver is again identified. Pancreas: Unremarkable. No pancreatic ductal dilatation or surrounding inflammatory changes. Spleen: Surgically removed Adrenals/Urinary Tract: Adrenal glands are stable. Left kidney shows no calculi or obstructive changes. The right kidney again demonstrates subcapsular air collections which are smaller than that seen on the prior exam however there is now extension of air into the perirenal fat particularly medially. Again no obstructive changes are seen. Inflammatory changes extend along the right pericolic gutter. Bladder is decompressed by Foley catheter. Stomach/Bowel: Appendix is within normal limits. No obstructive or inflammatory changes of the colon are seen. Stomach and small bowel are within normal limits. Vascular/Lymphatic: Aortic atherosclerotic changes are  noted. There are again seen lymph nodes in the region of the celiac axis, mesentery and periportal region scattered retroperitoneal nodes are again seen and stable. Reproductive: Status post hysterectomy. No adnexal masses. Other: New free fluid is noted within the pelvis likely reactive in nature. Musculoskeletal: No acute or significant osseous findings. IMPRESSION: Persistent findings of subcapsular gas in the right kidney although the collections appear smaller there has been some extension into the Perirenal fatty tissues which is new from the prior exam. Scattered lymph nodes as described similar to that seen on the prior exam. Significant increase in the degree of infiltrate with associated effusions in the bases bilaterally. Electronically Signed   By: Inez Catalina M.D.   On: 10/07/2021 01:49   DG CHEST PORT 1 VIEW  Result Date: 10/06/2021 CLINICAL DATA:  Shortness of breath EXAM: PORTABLE CHEST 1 VIEW COMPARISON:  10/06/2021, 10/04/2021 FINDINGS: Extensive left greater than right pulmonary airspace disease grossly unchanged as  compared with radiograph earlier today. Probable right pleural effusion. Obscured cardiomediastinal silhouette. No pneumothorax. IMPRESSION: 1. Grossly similar extensive left greater than right airspace disease which may be due to edema and or pneumonia 2. Suspect right pleural effusion Electronically Signed   By: Donavan Foil M.D.   On: 10/06/2021 22:39   ECHOCARDIOGRAM COMPLETE  Result Date: 10/06/2021    ECHOCARDIOGRAM REPORT   Patient Name:   [name] Date of Exam: 10/06/2021 Medical Rec #:  [id number]       Height:       62.5 in Accession #:    [id number]      Weight:       172.2 lb Date of Birth:  07-17-1965       BSA:          1.805 m Patient Age:    56 years        BP:           100/55 mmHg Patient Gender: F               HR:           83 bpm. Exam Location:  Inpatient Procedure: 2D Echo, Cardiac Doppler and Color Doppler Indications:    Cardiomyopathy  History:        Patient has prior history of Echocardiogram examinations, most                 recent 03/04/2014. CAD; Risk Factors:Diabetes and Hypertension.  Sonographer:    Jefferey Pica Referring Phys: [id number] [place]  1. Left ventricular ejection fraction, by estimation, is 40 to 45%. The left ventricle has mildly decreased function. The left ventricle has no regional wall motion abnormalities. Left ventricular diastolic parameters were normal.  2. Right ventricular systolic function is normal. The right ventricular size is normal. There is normal pulmonary artery systolic pressure.  3. Left atrial size was mildly dilated.  4. Right atrial size was mildly dilated.  5. The mitral valve is normal in structure. Trivial mitral valve regurgitation. No evidence of mitral stenosis.  6. The aortic valve is tricuspid. There is mild calcification of the aortic valve. Aortic valve regurgitation is not visualized. Aortic valve sclerosis/calcification is present, without any evidence of aortic stenosis.  7. The inferior vena cava is  normal in size with greater than 50% respiratory variability, suggesting right atrial pressure of 3 mmHg. FINDINGS  Left Ventricle: Left ventricular ejection fraction, by estimation, is 40 to 45%. The left ventricle has mildly decreased  function. The left ventricle has no regional wall motion abnormalities. The left ventricular internal cavity size was normal in size. There is no left ventricular hypertrophy. Left ventricular diastolic parameters were normal. Right Ventricle: The right ventricular size is normal. No increase in right ventricular wall thickness. Right ventricular systolic function is normal. There is normal pulmonary artery systolic pressure. The tricuspid regurgitant velocity is 2.15 m/s, and  with an assumed right atrial pressure of 15 mmHg, the estimated right ventricular systolic pressure is 66.4 mmHg. Left Atrium: Left atrial size was mildly dilated. Right Atrium: Right atrial size was mildly dilated. Pericardium: There is no evidence of pericardial effusion. Mitral Valve: The mitral valve is normal in structure. Trivial mitral valve regurgitation. No evidence of mitral valve stenosis. Tricuspid Valve: The tricuspid valve is normal in structure. Tricuspid valve regurgitation is trivial. No evidence of tricuspid stenosis. Aortic Valve: The aortic valve is tricuspid. There is mild calcification of the aortic valve. Aortic valve regurgitation is not visualized. Aortic valve sclerosis/calcification is present, without any evidence of aortic stenosis. Aortic valve peak gradient measures 6.2 mmHg. Pulmonic Valve: The pulmonic valve was normal in structure. Pulmonic valve regurgitation is trivial. No evidence of pulmonic stenosis. Aorta: The aortic root is normal in size and structure. Venous: The inferior vena cava is normal in size with greater than 50% respiratory variability, suggesting right atrial pressure of 3 mmHg. IAS/Shunts: The interatrial septum appears to be lipomatous. No atrial level  shunt detected by color flow Doppler.  LEFT VENTRICLE PLAX 2D LVIDd:         4.60 cm   Diastology LVIDs:         3.80 cm   LV e' medial:    6.66 cm/s LV PW:         1.00 cm   LV E/e' medial:  11.7 LV IVS:        1.10 cm   LV e' lateral:   9.25 cm/s LVOT diam:     2.00 cm   LV E/e' lateral: 8.4 LV SV:         75 LV SV Index:   41 LVOT Area:     3.14 cm  RIGHT VENTRICLE            IVC RV Basal diam:  3.00 cm    IVC diam: 2.50 cm RV S prime:     9.07 cm/s TAPSE (M-mode): 2.2 cm LEFT ATRIUM             Index        RIGHT ATRIUM           Index LA diam:        3.50 cm 1.94 cm/m   RA Area:     17.60 cm LA Vol (A2C):   45.5 ml 25.21 ml/m  RA Volume:   53.20 ml  29.48 ml/m LA Vol (A4C):   48.0 ml 26.60 ml/m LA Biplane Vol: 47.3 ml 26.21 ml/m  AORTIC VALVE                 PULMONIC VALVE AV Area (Vmax): 2.76 cm     PV Vmax:       0.59 m/s AV Vmax:        124.00 cm/s  PV Peak grad:  1.4 mmHg AV Peak Grad:   6.2 mmHg LVOT Vmax:      109.00 cm/s LVOT Vmean:     73.000 cm/s LVOT VTI:       0.238  m  AORTA Ao Root diam: 3.10 cm Ao Asc diam:  3.20 cm MITRAL VALVE               TRICUSPID VALVE MV Area (PHT): 5.75 cm    TR Peak grad:   18.5 mmHg MV Decel Time: 132 msec    TR Vmax:        215.00 cm/s MV E velocity: 78.10 cm/s MV A velocity: 56.30 cm/s  SHUNTS MV E/A ratio:  1.39        Systemic VTI:  0.24 m                            Systemic Diam: 2.00 cm Glori Bickers MD Electronically signed by Glori Bickers MD Signature Date/Time: 10/06/2021/3:52:31 PM    Final    DG CHEST PORT 1 VIEW  Result Date: 10/06/2021 CLINICAL DATA:  Dyspnea, emphysematous pyelonephritis EXAM: PORTABLE CHEST 1 VIEW COMPARISON:  10/04/2021 FINDINGS: Transverse diameter of heart is increased. Diffuse alveolar densities seen in the both lungs, more so on the left side. There is blunting of both lateral CP angles. There is no pneumothorax. IMPRESSION: Diffuse increase in alveolar densities seen in both lungs, more so on the left side  suggesting pulmonary edema. Possibility of underlying pneumonia is not excluded. Small bilateral pleural effusions. Electronically Signed   By: Elmer Picker M.D.   On: 10/06/2021 15:06   CT ABDOMEN PELVIS WO CONTRAST  Result Date: 10/05/2021 CLINICAL DATA:  Nausea and vomiting.  Hypotensive. EXAM: CT ABDOMEN AND PELVIS WITHOUT CONTRAST TECHNIQUE: Multidetector CT imaging of the abdomen and pelvis was performed following the standard protocol without IV contrast. RADIATION DOSE REDUCTION: This exam was performed according to the departmental dose-optimization program which includes automated exposure control, adjustment of the mA and/or kV according to patient size and/or use of iterative reconstruction technique. COMPARISON:  01/19/2012 FINDINGS: Lower chest: Atelectasis in the lung bases. Small esophageal hiatal hernia. Hepatobiliary: Hepatic changes suggest cirrhosis with enlarged lateral segment left and caudate lobes. Serpiginous portal and upper abdominal vessel suggesting varices. Gallbladder and bile ducts are unremarkable. Pancreas: Unremarkable. No pancreatic ductal dilatation or surrounding inflammatory changes. Spleen: Surgical absence of the spleen. Adrenals/Urinary Tract: No adrenal gland nodules. Sub capsular and soft tissue gas collections demonstrated within the right kidney, likely indicating infection with gas-forming organism. The estrogenic or traumatic causes could also have this appearance. Correlate with history of any biopsy or other procedure. No hydronephrosis or hydroureter. Bladder is normal. Stomach/Bowel: Stomach is within normal limits. Appendix appears normal. No evidence of bowel wall thickening, distention, or inflammatory changes. Vascular/Lymphatic: Calcification of the aorta. No aneurysm. Prominent lymph nodes demonstrated in the celiac axis, mesenteric, and retroperitoneum. Largest measure about 1.3 cm short axis dimension. Similar appearance to previous study, likely  indicating reactive or cirrhotic related nodes. Reproductive: Status post hysterectomy. No adnexal masses. Other: No free air or free fluid in the abdomen. Inflammatory stranding is demonstrated along the right pericolic gutters. Musculoskeletal: No acute or significant osseous findings. IMPRESSION: 1. Subcapsular and parenchymal gas collections within the right kidney. Unless there is been recent biopsy or other procedure, this is likely to represent infection with gas-forming organism. Inflammatory stranding suggested along the right pericolic gutter. 2. 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 3. Aortic atherosclerosis. Electronically Signed   By: Lucienne Capers M.D.   On: 10/05/2021 01:35   DG Chest 2 View  Result Date: 10/04/2021  CLINICAL DATA:  Chest pain, vomiting, weakness EXAM: CHEST - 2 VIEW COMPARISON:  09/19/2014 FINDINGS: Normal heart size and pulmonary vascularity. No focal airspace disease or consolidation in the lungs. No blunting of costophrenic angles. No pneumothorax. Mediastinal contours appear intact. Coronary stents are demonstrated. Calcification of the aorta. IMPRESSION: No active cardiopulmonary disease. Electronically Signed   By: Lucienne Capers M.D.   On: 10/04/2021 23:14   Labs:   Basic Metabolic Panel: Recent Labs  Lab 10/22/21 0301 10/24/21 1738  NA 135 134*  K 4.4 4.6  CL 95* 95*  CO2 27 30  GLUCOSE 337* 148*  BUN 15 15  CREATININE 1.30* 1.30*  CALCIUM 8.5* 8.7*     CBC: Recent Labs  Lab 10/22/21 0301 10/22/21 1106 10/24/21 1738 10/27/21 1045  WBC 13.0* 13.4* 13.6* 12.6*  HGB 6.8* 7.5* 7.9* 7.9*  HCT 20.3* 23.0* 25.0* 23.8*  MCV 95.3 96.2 95.8 94.4  PLT 682* 714* 772* 772*         SIGNED:   Debbe Odea, MD  [facility] 10/28/2021, 11:00 AM

## 2021-10-29 ENCOUNTER — Other Ambulatory Visit: Payer: Self-pay | Admitting: Internal Medicine

## 2021-10-29 DIAGNOSIS — N12 Tubulo-interstitial nephritis, not specified as acute or chronic: Secondary | ICD-10-CM

## 2021-11-07 ENCOUNTER — Encounter: Payer: Self-pay | Admitting: Internal Medicine

## 2021-11-07 ENCOUNTER — Other Ambulatory Visit: Payer: Self-pay

## 2021-11-07 ENCOUNTER — Ambulatory Visit (INDEPENDENT_AMBULATORY_CARE_PROVIDER_SITE_OTHER): Payer: Commercial Managed Care - HMO | Admitting: Internal Medicine

## 2021-11-07 VITALS — BP 104/69 | HR 93 | Resp 16 | Ht 62.5 in | Wt 168.0 lb

## 2021-11-07 DIAGNOSIS — N151 Renal and perinephric abscess: Secondary | ICD-10-CM | POA: Diagnosis not present

## 2021-11-07 MED ORDER — CEFADROXIL 500 MG PO CAPS: 500.0000 mg | ORAL_CAPSULE | Freq: Two times a day (BID) | ORAL | 0 refills | Status: DC

## 2021-11-07 NOTE — Progress Notes (Signed)
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   Thrombocytopenia ([place]) 10/05/2021   AKI (acute kidney injury) ([place]) 10/05/2021   Elevated troponin 10/05/2021   Acidosis 10/05/2021   Cirrhosis ([place]) 10/05/2021   Septic shock ([place]) 10/05/2021   Severe sepsis ([place])    Urinary tract infection without hematuria    Depression 09/21/2014   Cough 09/19/2014   Dyspnea 05/10/2014   Angina decubitus ([place]) 03/28/2014   Memory loss 03/28/2014   Bleeding gums 03/28/2014   Sinus bradycardia    Ischemic cardiomyopathy    CAD (coronary artery disease)    Hypokalemia 03/05/2014   Essential hypertension    ST elevation myocardial infarction (STEMI) involving right coronary artery with complication ([place]) 28/20/6015   Left rotator cuff tear 07/27/2013   Complete rotator cuff tear of left shoulder 07/27/2013   Otalgia 05/25/2013   Rotator cuff tear, non-traumatic 03/02/2013   Weakness generalized 09/06/2012   Fall 09/06/2012   Adjustment disorder with depressed mood 09/06/2012   Dizziness and giddiness 07/25/2012   Fibromyalgia 05/12/2012   Asplenia 05/12/2012   Diarrhea 61/53/7943   Nonalcoholic fatty liver disease 01/07/2012   GERD (gastroesophageal reflux disease) 09/15/2011   Hyperlipidemia    Lateral epicondylitis  of elbow 12/17/2008    Patient's Medications  New Prescriptions   No medications on file  Previous Medications   ACETAMINOPHEN (TYLENOL) 325 MG TABLET    Take 2 tablets (650 mg total) by mouth every 6 (six) hours as needed for mild pain (or  Fever >/= 101).   ALBUTEROL (VENTOLIN HFA) 108 (90 BASE) MCG/ACT INHALER    Inhale 2 puffs into the lungs every 4 (four) hours as needed for shortness of breath or wheezing.   CEFADROXIL (DURICEF) 500 MG CAPSULE    Take 1 capsule (500 mg total) by mouth 2 (two) times daily for 15 days.   CHOLECALCIFEROL (VITAMIN D3) 1.25 MG (50000 UT) CAPS    Take 50,000 Units by mouth every Sunday.   DULOXETINE (CYMBALTA) 60 MG CAPSULE    Take 60 mg by mouth at bedtime.   GABAPENTIN (NEURONTIN) 300 MG CAPSULE    Take 1 capsule (300 mg total) by mouth 3 (three) times daily.   INSULIN ASPART (NOVOLOG FLEXPEN) 100 UNIT/ML FLEXPEN    Inject 1-15 Units into the skin 3 (three) times daily with meals. CBG 121 - 150: 2 units  CBG 151 - 200: 3 units  CBG 201 - 250: 5 units  CBG 251 - 300: 8 units  CBG 301 - 350: 11 units  CBG 351 - 400: 15 units   INSULIN GLARGINE (BASAGLAR KWIKPEN) 100 UNIT/ML    Inject 35 Units into the skin at bedtime.   LISINOPRIL (ZESTRIL) 10 MG TABLET    Take 10 mg by mouth daily.   METFORMIN (GLUCOPHAGE) 1000 MG TABLET    Take 1,000 mg by mouth 2 (two) times daily.   METHOCARBAMOL (ROBAXIN) 500 MG TABLET    Take 1 tablet (500 mg total) by mouth every 8 (eight) hours as needed for muscle  spasms.   MULTIPLE VITAMIN (MULTIVITAMIN WITH MINERALS) TABS TABLET    Take 1 tablet by mouth daily.   PANTOPRAZOLE (PROTONIX) 40 MG TABLET    Take 1 tablet (40 mg total) by mouth daily.   POLYETHYLENE GLYCOL POWDER (GLYCOLAX/MIRALAX) 17 GM/SCOOP POWDER    Take 17 g by mouth daily as needed for moderate constipation.   PROBIOTIC PRODUCT (PROBIOTIC PO)    Take 1 capsule by mouth daily.   SENNA-DOCUSATE (SENOKOT-S) 8.6-50 MG TABLET    Take 1 tablet by mouth 2 (two) times daily.   SIMVASTATIN (ZOCOR) 40 MG TABLET    Take 40 mg by mouth at bedtime.   SODIUM CHLORIDE FLUSH 0.9 % SOLN INJECTION    Use 5 mLs by Intracatheter route daily for 20 days.   TRAMADOL (ULTRAM) 50 MG TABLET    Take 1 tablet (50 mg total) by  mouth every 8 (eight) hours as needed for up to 21 days for severe pain.  Modified Medications   No medications on file  Discontinued Medications   No medications on file    Subjective: 56 year old with past medical history as below including CAD, diabetes, NASH cirrhosis, C. 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  She was admitted to [facility] 6/18 - 7/11 for GI symptoms of nausea/vomiting/diarrhea found to have E. coli bacteremia secondary to emphysematous right-sided pyelonephritis. CT on 6/20  has showed small perirenal fluid collection with gas.   Repeat CT on 6/24 showed heterogenous perfusion of the left kidney suggesting pyelonephritis without fluid and emphysematous pyelonephritis right with interval decrease in gas.  collection/abscess, cultures positive E. coli. On 6/26 7 mL of purulent fluid was aspirated from right kidney, unable to leave drain in. She had worsened left flank pain.  Repeat CT on 7/4 showed multifocal fluid attenuating structure in the right kidney consistent with renal abscess secondary to emphysematous pyelonephritis.  She continues to have right-sided pain on day 19 of antibiotics without resolution and leukocytosis of abscess IR was engaged and drain placed.   On 7/6 IR aspirated to amount of purulent fluid sent for cultures, drain placed cultures no growth.  She received ceftriaxone inpatient and discharged on cefadroxil 500 mg p.o. twice daily to complete 2 weeks of antibiotics EOT 7/26 Today: Pt reports she continues to drain form her right flank drain although decreased(1.5 ml/day over the last couple days). Denies fever, chills. She reports her back on right side persists.  Microbiology:   Antibiotics: Ceftriaxone 6/17-7/11 Cefadroxil 7/11-p Cultures: Blood 6/17 2/2 ecoli Urine 6/17 ecoli Other 6/26 kidney abscess aspiration Ecoli 7/6 NG  Review of  Systems: ROS  Past Medical History:  Diagnosis Date   Anxiety    CAD (coronary artery disease)    a.  Inferior STEMI / CAD s/p DES to prox RCA 03/03/14, staged cath 03/05/14 for LAD showing significantly improved stenosis of mid LAD with resolution of prior thrombus.   Clostridium difficile colitis    Diabetes mellitus    Dyslipidemia    Fibromyalgia    GERD (gastroesophageal reflux disease)    H/O hiatal hernia    Ischemic cardiomyopathy    a. EF 45-50% by echo 03/04/14 with suspected mild acute diastolic CHF s/p dose of IV Lasix.   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  Sinus bradycardia    a. During 02/2014 admit, not on BB due to this.   Upper respiratory infection 2/15   states has dry non productive cough since then- "tickle in my throat" no fever    Social History   Tobacco Use   Smoking status: Former    Packs/day: 0.25    Years: 30.00    Total pack years: 7.50    Types: Cigarettes    Quit date: 04/20/2006    Years since quitting: 15.5   Smokeless tobacco: Never  Substance Use Topics   Alcohol use: Yes    Alcohol/week: 0.0 standard drinks of alcohol    Comment: rarely   Drug use: No    Family History  Problem Relation Age of Onset   Arthritis Mother    Diabetes Father    Kidney disease Father    Colon cancer Neg Hx     Allergies  Allergen Reactions   Aspartame And Phenylalanine Anaphylaxis, Shortness Of Breath and Swelling    Throat swells  ANY ARTIFICAL SWEETNERS   Toradol [Ketorolac Tromethamine] Other (See Comments)    Face & neck flushed red & pt felt very hot after IV adm.   Sucralose Rash    Pt.s face broke out in rash after drinking Breeza.  Pt. Claims it is from the artificial sweetener.      Codeine Swelling   Dilaudid [Hydromorphone Hcl] Itching   Hydrocodone Itching   Oxycodone Itching    Health Maintenance  Topic  Date Due   COVID-19 Vaccine (1) Never done   OPHTHALMOLOGY EXAM  Never done   Meningococcal B Vaccine (1 of 4 - Increased Risk) 01/19/1975   TETANUS/TDAP  Never done   FOOT EXAM  05/25/2014   Zoster Vaccines- Shingrix (1 of 2) Never done   MAMMOGRAM  05/14/2017   INFLUENZA VACCINE  11/18/2021   COLONOSCOPY (Pts 45-55yr Insurance coverage will need to be confirmed)  12/06/2021   HEMOGLOBIN A1C  04/06/2022   Hepatitis C Screening  Completed   HIV Screening  Completed   HPV VACCINES  Aged Out    Objective:  There were no vitals filed for this visit. There is no height or weight on file to calculate BMI.  Physical Exam Constitutional:      Appearance: Normal appearance.  HENT:     Head: Normocephalic and atraumatic.     Right Ear: Tympanic membrane normal.     Left Ear: Tympanic membrane normal.     Nose: Nose normal.     Mouth/Throat:     Mouth: Mucous membranes are moist.  Eyes:     Extraocular Movements: Extraocular movements intact.     Conjunctiva/sclera: Conjunctivae normal.     Pupils: Pupils are equal, round, and reactive to light.  Cardiovascular:     Rate and Rhythm: Normal rate and regular rhythm.     Heart sounds: No murmur heard.    No friction rub. No gallop.  Pulmonary:     Effort: Pulmonary effort is normal.     Breath sounds: Normal breath sounds.  Abdominal:     General: Abdomen is flat.     Palpations: Abdomen is soft.     Comments: Right flank drain  Musculoskeletal:        General: Normal range of motion.  Skin:    General: Skin is warm and dry.  Neurological:     General: No focal deficit present.     Mental Status: She is alert and oriented to  person, place, and time.  Psychiatric:        Mood and Affect: Mood normal.     Lab Results Lab Results  Component Value Date   WBC 12.6 (H) 10/27/2021   HGB 7.9 (L) 10/27/2021   HCT 23.8 (L) 10/27/2021   MCV 94.4 10/27/2021   PLT 772 (H) 10/27/2021    Lab Results  Component Value Date    CREATININE 1.30 (H) 10/24/2021   BUN 15 10/24/2021   NA 134 (L) 10/24/2021   K 4.6 10/24/2021   CL 95 (L) 10/24/2021   CO2 30 10/24/2021    Lab Results  Component Value Date   ALT 45 (H) 10/22/2021   AST 39 10/22/2021   ALKPHOS 139 (H) 10/22/2021   BILITOT 0.4 10/22/2021    Lab Results  Component Value Date   CHOL 259 (H) 03/03/2014   HDL 38 (L) 03/03/2014   LDLCALC 198 (H) 03/03/2014   TRIG 114 03/03/2014   CHOLHDL 6.8 03/03/2014   No results found for: "LABRPR", "RPRTITER" No results found for: "HIV1RNAQUANT", "HIV1RNAVL", "CD4TABS"   A/P #E. coli bacteremia 2/2 right sided pyelonephritis with aspirate Cx+ Ecoli on 6/26 #Emphysematous right-sided pyelonephritis  status post IR PERC drain placement on 7/6 with negative Cx She was treated with ceftriaxone while inpatient and switched to cefadroxil 500 mg PO bid on discharge She had multiple CTs during hospitalization with last CT on 7/4 showing multifocal fluid attenuating structures in right kidney c/w renal abscesses 2/2 emphysematous cystitis, left sided pyelonephritis. She underwent Perc drain placement in the setting of leukocytosis and continued flank pain.  She continues to have right sided flank pain which has improved but persists. Drain in in place with CT and eval with IR on 7/25.  Plan CT scheduled for 7/25 to assess progression of  abscess Follow-up on 7/28 Cbc, cmp esr crp Continue cefadroxil 529m PO bid till next ID appt on 7/28  I spent more than 45 minutes for this patient encounter including reviewing data/chart, and coordinating care and >50% direct face to face time providing counseling/discussing diagnostics/treatment plan with patient   MLaurice Record MBlackstonefor Infectious DOaklandGroup 11/07/2021, 2:03 PM

## 2021-11-08 ENCOUNTER — Encounter: Payer: Self-pay | Admitting: Internal Medicine

## 2021-11-08 LAB — COMPLETE METABOLIC PANEL WITH GFR
AG Ratio: 0.8 (calc) — ABNORMAL LOW (ref 1.0–2.5)
ALT: 27 U/L (ref 6–29)
AST: 21 U/L (ref 10–35)
Albumin: 3.6 g/dL (ref 3.6–5.1)
Alkaline phosphatase (APISO): 167 U/L — ABNORMAL HIGH (ref 37–153)
BUN/Creatinine Ratio: 25 (calc) — ABNORMAL HIGH (ref 6–22)
BUN: 35 mg/dL — ABNORMAL HIGH (ref 7–25)
CO2: 26 mmol/L (ref 20–32)
Calcium: 10.1 mg/dL (ref 8.6–10.4)
Chloride: 95 mmol/L — ABNORMAL LOW (ref 98–110)
Creat: 1.41 mg/dL — ABNORMAL HIGH (ref 0.50–1.03)
Globulin: 4.6 g/dL (calc) — ABNORMAL HIGH (ref 1.9–3.7)
Glucose, Bld: 332 mg/dL — ABNORMAL HIGH (ref 65–99)
Potassium: 6.7 mmol/L (ref 3.5–5.3)
Sodium: 133 mmol/L — ABNORMAL LOW (ref 135–146)
Total Bilirubin: 0.3 mg/dL (ref 0.2–1.2)
Total Protein: 8.2 g/dL — ABNORMAL HIGH (ref 6.1–8.1)
eGFR: 44 mL/min/{1.73_m2} — ABNORMAL LOW (ref >=60)

## 2021-11-08 LAB — CBC WITH DIFFERENTIAL/PLATELET
Absolute Monocytes: 1009 cells/uL — ABNORMAL HIGH (ref 200–950)
Basophils Absolute: 123 cells/uL (ref 0–200)
Basophils Relative: 1 %
Eosinophils Absolute: 246 cells/uL (ref 15–500)
Eosinophils Relative: 2 %
HCT: 31.1 % — ABNORMAL LOW (ref 35.0–45.0)
Hemoglobin: 10.1 g/dL — ABNORMAL LOW (ref 11.7–15.5)
Lymphs Abs: 3358 cells/uL (ref 850–3900)
MCH: 31 pg (ref 27.0–33.0)
MCHC: 32.5 g/dL (ref 32.0–36.0)
MCV: 95.4 fL (ref 80.0–100.0)
MPV: 10.3 fL (ref 7.5–12.5)
Monocytes Relative: 8.2 %
Neutro Abs: 7565 cells/uL (ref 1500–7800)
Neutrophils Relative %: 61.5 %
Platelets: 764 10*3/uL — ABNORMAL HIGH (ref 140–400)
RBC: 3.26 10*6/uL — ABNORMAL LOW (ref 3.80–5.10)
RDW: 13.3 % (ref 11.0–15.0)
Total Lymphocyte: 27.3 %
WBC: 12.3 10*3/uL — ABNORMAL HIGH (ref 3.8–10.8)

## 2021-11-08 LAB — SEDIMENTATION RATE: Sed Rate: 112 mm/h — ABNORMAL HIGH (ref 0–30)

## 2021-11-08 LAB — C-REACTIVE PROTEIN: CRP: 18.1 mg/L — ABNORMAL HIGH (ref <8.0)

## 2021-11-08 NOTE — Progress Notes (Signed)
Called pt per potassium of 6.7. no options to leave voicemail. I sent a patient in my chart message go to the ED for further evaluation. Would be ideal to do the CT at that time as well.

## 2021-11-09 ENCOUNTER — Telehealth: Payer: Self-pay | Admitting: Internal Medicine

## 2021-11-09 NOTE — Telephone Encounter (Signed)
Attempted to call patient this weekend x 2 and no answer, voicemail full.  Elevated K which needs to be rechecked and managed.  Message already sent on MyChart.   Thayer Headings, MD

## 2021-11-10 ENCOUNTER — Emergency Department (HOSPITAL_COMMUNITY)
Admission: EM | Admit: 2021-11-10 | Discharge: 2021-11-10 | Disposition: A | Discharge status: Home / Self Care | Payer: Commercial Managed Care - HMO | Attending: Emergency Medicine | Admitting: Emergency Medicine

## 2021-11-10 ENCOUNTER — Encounter (HOSPITAL_COMMUNITY): Payer: Self-pay | Admitting: Emergency Medicine

## 2021-11-10 ENCOUNTER — Telehealth: Payer: Self-pay

## 2021-11-10 ENCOUNTER — Other Ambulatory Visit: Payer: Self-pay

## 2021-11-10 ENCOUNTER — Emergency Department (HOSPITAL_COMMUNITY): Payer: Commercial Managed Care - HMO

## 2021-11-10 ENCOUNTER — Other Ambulatory Visit (HOSPITAL_COMMUNITY): Payer: Self-pay

## 2021-11-10 DIAGNOSIS — I10 Essential (primary) hypertension: Secondary | ICD-10-CM | POA: Insufficient documentation

## 2021-11-10 DIAGNOSIS — N16 Renal tubulo-interstitial disorders in diseases classified elsewhere: Secondary | ICD-10-CM | POA: Insufficient documentation

## 2021-11-10 DIAGNOSIS — Z79899 Other long term (current) drug therapy: Secondary | ICD-10-CM | POA: Diagnosis not present

## 2021-11-10 DIAGNOSIS — R799 Abnormal finding of blood chemistry, unspecified: Secondary | ICD-10-CM | POA: Diagnosis present

## 2021-11-10 DIAGNOSIS — N12 Tubulo-interstitial nephritis, not specified as acute or chronic: Secondary | ICD-10-CM

## 2021-11-10 DIAGNOSIS — Z794 Long term (current) use of insulin: Secondary | ICD-10-CM | POA: Diagnosis not present

## 2021-11-10 LAB — URINALYSIS, ROUTINE W REFLEX MICROSCOPIC
Bilirubin Urine: NEGATIVE
Glucose, UA: NEGATIVE mg/dL
Ketones, ur: NEGATIVE mg/dL
Nitrite: NEGATIVE
Protein, ur: 30 mg/dL — AB
Specific Gravity, Urine: 1.012 (ref 1.005–1.030)
WBC, UA: >50 WBC/hpf — ABNORMAL HIGH (ref 0–5)
pH: 7 (ref 5.0–8.0)

## 2021-11-10 LAB — CBC WITH DIFFERENTIAL/PLATELET
Abs Immature Granulocytes: 0.03 10*3/uL (ref 0.00–0.07)
Basophils Absolute: 0.2 10*3/uL — ABNORMAL HIGH (ref 0.0–0.1)
Basophils Relative: 1 %
Eosinophils Absolute: 0.7 10*3/uL — ABNORMAL HIGH (ref 0.0–0.5)
Eosinophils Relative: 6 %
HCT: 32.4 % — ABNORMAL LOW (ref 36.0–46.0)
Hemoglobin: 10.6 g/dL — ABNORMAL LOW (ref 12.0–15.0)
Immature Granulocytes: 0 %
Lymphocytes Relative: 37 %
Lymphs Abs: 4.4 10*3/uL — ABNORMAL HIGH (ref 0.7–4.0)
MCH: 31.1 pg (ref 26.0–34.0)
MCHC: 32.7 g/dL (ref 30.0–36.0)
MCV: 95 fL (ref 80.0–100.0)
Monocytes Absolute: 1.3 10*3/uL — ABNORMAL HIGH (ref 0.1–1.0)
Monocytes Relative: 11 %
Neutro Abs: 5.4 10*3/uL (ref 1.7–7.7)
Neutrophils Relative %: 45 %
Platelets: 662 10*3/uL — ABNORMAL HIGH (ref 150–400)
RBC: 3.41 MIL/uL — ABNORMAL LOW (ref 3.87–5.11)
RDW: 15.4 % (ref 11.5–15.5)
WBC: 11.9 10*3/uL — ABNORMAL HIGH (ref 4.0–10.5)
nRBC: 0 % (ref 0.0–0.2)

## 2021-11-10 LAB — COMPREHENSIVE METABOLIC PANEL
ALT: 24 U/L (ref 0–44)
AST: 18 U/L (ref 15–41)
Albumin: 2.9 g/dL — ABNORMAL LOW (ref 3.5–5.0)
Alkaline Phosphatase: 123 U/L (ref 38–126)
Anion gap: 7 (ref 5–15)
BUN: 26 mg/dL — ABNORMAL HIGH (ref 6–20)
CO2: 31 mmol/L (ref 22–32)
Calcium: 9.6 mg/dL (ref 8.9–10.3)
Chloride: 99 mmol/L (ref 98–111)
Creatinine, Ser: 1.25 mg/dL — ABNORMAL HIGH (ref 0.44–1.00)
GFR, Estimated: 51 mL/min — ABNORMAL LOW (ref >60)
Glucose, Bld: 163 mg/dL — ABNORMAL HIGH (ref 70–99)
Potassium: 4.5 mmol/L (ref 3.5–5.1)
Sodium: 137 mmol/L (ref 135–145)
Total Bilirubin: 0.4 mg/dL (ref 0.3–1.2)
Total Protein: 8.6 g/dL — ABNORMAL HIGH (ref 6.5–8.1)

## 2021-11-10 LAB — MAGNESIUM: Magnesium: 1.6 mg/dL — ABNORMAL LOW (ref 1.7–2.4)

## 2021-11-10 MED ORDER — CEFADROXIL 500 MG PO CAPS: 500.0000 mg | ORAL_CAPSULE | Freq: Two times a day (BID) | ORAL | 0 refills | Status: DC

## 2021-11-10 MED ORDER — IOHEXOL 300 MG/ML  SOLN
100.0000 mL | Freq: Once | INTRAMUSCULAR | Status: AC | PRN
  Administered 2021-11-10: 100 mL via INTRAVENOUS

## 2021-11-10 NOTE — Telephone Encounter (Signed)
Resent cefadroxil to [facility] on [address] per patient request.   Beryle Flock, RN

## 2021-11-10 NOTE — Telephone Encounter (Signed)
Called and spoke with patient per Dr. Candiss Norse. Communicated that providers Dr. Candiss Norse and Dr. Linus Salmons had attempted to reach her regarding lab results over the weekend. Per Dr. Candiss Norse, lab results were abnormal, and she advised that patient should go to the ED.   All questions answered. Patient verbalized understanding. Pt called back to triage and stated she was getting ready to go to the ED. Dr. Candiss Norse notified.  Binnie Kand, RN

## 2021-11-10 NOTE — Telephone Encounter (Signed)
-----   Message from Roney Jaffe, CPhT sent at 11/10/2021 12:14 PM EDT ----- Regarding: Medications Frazier Butt ,  This Patient called and left a message on Friday saying Dr Candiss Norse sent her scripts to the [facility] on [address] which her scripts should have went to the [facility] on [address]. If you can re route her scripts please .   Thank you,  Ileene Patrick, [place] Patient [facility] Phone: [phone] Fax: [phone]

## 2021-11-10 NOTE — ED Provider Triage Note (Signed)
Emergency Medicine Provider Triage Evaluation Note  [name] , a 56 y.o. female  was evaluated in triage.  Pt complains of abnormal lab.  Patient had a renal abscess, drain in place.  She had somewhat decreased drainage, called by infectious disease doctor telling her to go to ED due to potassium of 6.7 last Friday.   She denies any abdominal pain, fevers, nausea, vomiting, diarrhea, change in bowel habits or oral intake..  Review of Systems  Per HPI  Physical Exam  There were no vitals taken for this visit. Gen:   Awake, no distress   Resp:  Normal effort  MSK:   Moves extremities without difficulty  Other:  Mild right CVA tenderness.  Abdomen is soft and nontender.  Medical Decision Making  Medically screening exam initiated at 9:44 AM.  Appropriate orders placed.  [name] was informed that the remainder of the evaluation will be completed by another provider, this initial triage assessment does not replace that evaluation, and the importance of remaining in the ED until their evaluation is complete.  ESR CRP done 3 days ago, will not repeat.  Patient does not appear septic, already on abx. Blood culture not ordered in triage.    [name], [place] 11/10/21 [phone]

## 2021-11-10 NOTE — ED Triage Notes (Signed)
Patient sent by PCP for K of 6.7. Patient with abscess on kidney last time she was seen. Pt with drain for same. No issues with the drain. No other complaints. No fevers chills, pain, chest pain, SHOB. Pt only reports the flank pain to the R since dealing with the kidney abscess.

## 2021-11-10 NOTE — ED Provider Notes (Signed)
[facility] EMERGENCY DEPARTMENT Provider Note   CSN: [id number] Arrival date & time: 11/10/21  4696     History  Chief Complaint  Patient presents with   abnormal labs     [name] is a 56 y.o. female.  HPI    Patient has a history of hyperlipidemia, fibromyalgia, STEMI, hypertension, ischemic cardiomyopathy, nonalcoholic cirrhosis, sepsis, urinary tract infection recently admitted to the hospital in June of this year and discharged on July 11.  Patient was treated for emphysematous pyelonephritis, E. coli bacteremia.  Patient required perinephric aspiration on June 26.  On July 6 she was noted to have renal abscesses.  She did have aspiration of the abscesses.  Patient was discharged on antibiotics, cefadroxil.  Patient had laboratory tests drawn as an outpatient on July 21.  Patient's creatinine was elevated at 6.7.  Her creatinine was increased at 1.41.  The clinic was only able to get in touch with her today.  She was sent to the ED for further evaluation. Patient denies any new complaints.  She continues to have pain where the drain is located.  She also has some abdominal discomfort.  This is not new or different.  She has not had any vomiting or diarrhea.  Her appetite has been fine.  She has not noticed any change in her urine output.  She is not taking supplemental potassium Home Medications Prior to Admission medications   Medication Sig Start Date End Date Taking? Authorizing Provider  acetaminophen (TYLENOL) 325 MG tablet Take 2 tablets (650 mg total) by mouth every 6 (six) hours as needed for mild pain (or Fever >/= 101). 10/28/21   Debbe Odea, MD  albuterol (VENTOLIN HFA) 108 (90 Base) MCG/ACT inhaler Inhale 2 puffs into the lungs every 4 (four) hours as needed for shortness of breath or wheezing. 05/27/21   [provider]  cefadroxil (DURICEF) 500 MG capsule Take 1 capsule (500 mg total) by mouth 2 (two) times daily for 15 days. 11/10/21  11/25/21  Laurice Record, MD  Cholecalciferol (VITAMIN D3) 1.25 MG (50000 UT) CAPS Take 50,000 Units by mouth every Sunday. 10/02/21   [provider]  DULoxetine (CYMBALTA) 60 MG capsule Take 60 mg by mouth at bedtime. 09/16/21   [provider]  gabapentin (NEURONTIN) 300 MG capsule Take 1 capsule (300 mg total) by mouth 3 (three) times daily. 10/28/21 11/27/21  Debbe Odea, MD  insulin aspart (NOVOLOG FLEXPEN) 100 UNIT/ML FlexPen Inject 1-15 Units into the skin 3 (three) times daily with meals. CBG 121 - 150: 2 units  CBG 151 - 200: 3 units  CBG 201 - 250: 5 units  CBG 251 - 300: 8 units  CBG 301 - 350: 11 units  CBG 351 - 400: 15 units 10/28/21   Debbe Odea, MD  Insulin Glargine (BASAGLAR KWIKPEN) 100 UNIT/ML Inject 35 Units into the skin at bedtime. Patient taking differently: Inject 35 Units into the skin at bedtime. SLIDING SCALE 10/28/21   Debbe Odea, MD  lisinopril (ZESTRIL) 10 MG tablet Take 10 mg by mouth daily. 09/16/21   [provider]  metFORMIN (GLUCOPHAGE) 1000 MG tablet Take 1,000 mg by mouth 2 (two) times daily. 09/07/21   [provider]  methocarbamol (ROBAXIN) 500 MG tablet Take 1 tablet (500 mg total) by mouth every 8 (eight) hours as needed for muscle spasms. 10/28/21 11/27/21  Debbe Odea, MD  Multiple Vitamin (MULTIVITAMIN WITH MINERALS) TABS tablet Take 1 tablet by mouth daily. 10/29/21  11/28/21  Debbe Odea, MD  pantoprazole (PROTONIX) 40 MG tablet Take 1 tablet (40 mg total) by mouth daily. 10/29/21 11/28/21  Debbe Odea, MD  polyethylene glycol powder (GLYCOLAX/MIRALAX) 17 GM/SCOOP powder Take 17 g by mouth daily as needed for moderate constipation. 10/28/21   Debbe Odea, MD  Probiotic Product (PROBIOTIC PO) Take 1 capsule by mouth daily.    [provider]  senna-docusate (SENOKOT-S) 8.6-50 MG tablet Take 1 tablet by mouth 2 (two) times daily. 10/28/21 11/27/21  Debbe Odea, MD  simvastatin (ZOCOR) 40 MG tablet Take 40 mg  by mouth at bedtime. 09/14/21   [provider]  sodium chloride flush 0.9 % SOLN injection Use 5 mLs by Intracatheter route daily for 20 days. 10/28/21 11/17/21  Boisseau, Angie Fava, PA  traMADol (ULTRAM) 50 MG tablet Take 1 tablet (50 mg total) by mouth every 8 (eight) hours as needed for up to 21 days for severe pain. 10/28/21 11/18/21  Debbe Odea, MD      Allergies    Aspartame and phenylalanine, Toradol [ketorolac tromethamine], Sucralose, Codeine, Dilaudid [hydromorphone hcl], Hydrocodone, and Oxycodone    Review of Systems   Review of Systems  Physical Exam Updated Vital Signs BP 104/60   Pulse 80   Resp 12   Ht 1.575 m ('5\' 2"'$ )   SpO2 96%   BMI 30.73 kg/m  Physical Exam Vitals and nursing note reviewed.  Constitutional:      General: She is not in acute distress.    Appearance: She is well-developed. She is not diaphoretic.  HENT:     Head: Normocephalic and atraumatic.     Right Ear: External ear normal.     Left Ear: External ear normal.  Eyes:     General: No scleral icterus.       Right eye: No discharge.        Left eye: No discharge.     Conjunctiva/sclera: Conjunctivae normal.  Neck:     Trachea: No tracheal deviation.  Cardiovascular:     Rate and Rhythm: Normal rate and regular rhythm.  Pulmonary:     Effort: Pulmonary effort is normal. No respiratory distress.     Breath sounds: Normal breath sounds. No stridor.  Abdominal:     General: Abdomen is flat. There is no distension.     Tenderness: There is no guarding.     Comments: Drain right flank  Musculoskeletal:        General: No swelling or deformity.     Cervical back: Neck supple.  Skin:    General: Skin is warm and dry.     Findings: No rash.  Neurological:     Mental Status: She is alert.     Cranial Nerves: Cranial nerve deficit: no gross deficits.     ED Results / Procedures / Treatments   Labs (all labs ordered are listed, but only abnormal results are displayed) Labs Reviewed   COMPREHENSIVE METABOLIC PANEL - Abnormal; Notable for the following components:      Result Value   Glucose, Bld 163 (*)    BUN 26 (*)    Creatinine, Ser 1.25 (*)    Total Protein 8.6 (*)    Albumin 2.9 (*)    GFR, Estimated 51 (*)    All other components within normal limits  CBC WITH DIFFERENTIAL/PLATELET - Abnormal; Notable for the following components:   WBC 11.9 (*)    RBC 3.41 (*)    Hemoglobin 10.6 (*)    HCT  32.4 (*)    Platelets 662 (*)    Lymphs Abs 4.4 (*)    Monocytes Absolute 1.3 (*)    Eosinophils Absolute 0.7 (*)    Basophils Absolute 0.2 (*)    All other components within normal limits  MAGNESIUM - Abnormal; Notable for the following components:   Magnesium 1.6 (*)    All other components within normal limits  URINALYSIS, ROUTINE W REFLEX MICROSCOPIC - Abnormal; Notable for the following components:   APPearance HAZY (*)    Hgb urine dipstick SMALL (*)    Protein, ur 30 (*)    Leukocytes,Ua LARGE (*)    WBC, UA >50 (*)    Bacteria, UA RARE (*)    All other components within normal limits    EKG None  Radiology CT Abdomen Pelvis W Contrast  Result Date: 11/10/2021 CLINICAL DATA:  Follow up renal abscess following percutaneous drainage 10/23/2021. EXAM: CT ABDOMEN AND PELVIS WITH CONTRAST TECHNIQUE: Multidetector CT imaging of the abdomen and pelvis was performed using the standard protocol following bolus administration of intravenous contrast. RADIATION DOSE REDUCTION: This exam was performed according to the departmental dose-optimization program which includes automated exposure control, adjustment of the mA and/or kV according to patient size and/or use of iterative reconstruction technique. CONTRAST:  146m OMNIPAQUE IOHEXOL 300 MG/ML  SOLN COMPARISON:  Prior CT 10/23/2021 and 10/21/2021. FINDINGS: Lower chest: Interval near complete resolution of previously demonstrated right pleural effusion. There is residual subsegmental atelectasis at both lung bases.  Hepatobiliary: The liver is normal in density without suspicious focal abnormality. No evidence of gallstones, gallbladder wall thickening or biliary dilatation. Chronic portal vein thrombosis with cavernous transformation. Pancreas: Unremarkable. No pancreatic ductal dilatation or surrounding inflammatory changes. 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 Adrenals/Urinary Tract: Both adrenal glands appear normal. Percutaneous drain is noted along the medial aspect of the upper pole of the right kidney. The previously demonstrated fluid collection in this area has nearly completely resolved, now measuring approximately 1.3 x 1.4 cm on image 13/8 (previously 3.9 x 3.5 cm). Small fluid collection laterally along the interpolar region of the right kidney now measures 1.6 x 1.1 cm on image 13/8 (previously 2.8 x 1.7 cm). No new or enlarging fluid collections are identified. There is heterogeneous enhancement of both kidneys. Delayed images demonstrate normal excretion into nondilated collecting systems bilaterally. No evidence of urinary tract calculus. The bladder appears unremarkable for its degree of distention. Stomach/Bowel: No enteric contrast administered. The stomach appears unremarkable for its degree of distension. No evidence of bowel wall thickening, distention or surrounding inflammatory change. The appendix appears normal. Vascular/Lymphatic: Multiple prominent lymph nodes are again noted in the porta hepatis and upper retroperitoneum which are similar to the previous study, likely reactive. Diffuse aortic and branch vessel atherosclerosis without evidence of aneurysm or large vessel occlusion. There is chronic cavernous transformation of the portal vein. Reproductive: Hysterectomy.  No adnexal mass. Other: No enlarging intra-abdominal fluid collections, ascites or free air. Generalized soft tissue edema has improved. Musculoskeletal: No acute or significant osseous  findings. Lower lumbar spondylosis with disc space narrowing and vacuum phenomenon at L5-S1. IMPRESSION: 1. Interval near complete evacuation of previously demonstrated perinephric abscess along the medial aspect of the upper pole of the right kidney. Small collection laterally has also improved. 2. Heterogeneous enhancement of both kidneys consistent with pyelonephritis. No new or enlarging collections identified. No hydronephrosis or delayed contrast excretion. 3. Interval near complete resolution of previously demonstrated right pleural effusion  with improved aeration of the right lung base. 4. Stable prominent lymph nodes in the upper abdomen, likely reactive. 5. Chronic portal vein cavernous transformation. 6.  Aortic Atherosclerosis (ICD10-I70.0). Electronically Signed   By: Richardean Sale M.D.   On: 11/10/2021 13:02    Procedures Procedures    Medications Ordered in ED Medications  iohexol (OMNIPAQUE) 300 MG/ML solution 100 mL (100 mLs Intravenous Contrast Given 11/10/21 1232)    ED Course/ Medical Decision Making/ A&P Clinical Course as of 11/10/21 1511  Mon Nov 10, 2021  1352 Comprehensive metabolic panel(!) Potassium level was normal [JK]  1352 Magnesium(!) Magnesium slightly decreased [JK]  1352 Urinalysis, Routine w reflex microscopic Urine, Clean Catch(!) Urinalysis shows persistent signs of infection [JK]  1353 CT Abdomen Pelvis W Contrast CT scan shows findings consistent with pyelonephritis but abscesses appear to be decreasing in size [JK]    Clinical Course User Index [JK] Dorie Rank, MD                           Medical Decision Making Problems Addressed: Pyelonephritis: chronic illness or injury  Amount and/or Complexity of Data Reviewed Labs:  Decision-making details documented in ED Course. Radiology:  Decision-making details documented in ED Course.  Patient presented to the ED for evaluation of possible hyperkalemia.  Patient had abnormal outpatient  laboratory test.  This was rechecked in the emergency room today.  Her potassium level is normal.  Suspect the previous result was due to hemolysis.  Patient has had a complicated recent medical history.  She was treated for sepsis and renal abscesses.  Patient was post to have an outpatient CT scan of her abdomen pelvis tomorrow to follow-up on her renal abscesses.  That was performed while she was here in the emergency room.  CT scan does show findings of persistent pyelonephritis but decreasing renal abscesses.  Urinalysis does show signs of persistent infection but the patient's not having any fevers clinically she appears well.  I discussed the case with infectious disease Dr. Linus Salmons.  He recommends continuing her current regimen.  She will follow-up in the infectious disease clinic this week        Final Clinical Impression(s) / ED Diagnoses Final diagnoses:  Pyelonephritis    Rx / DC Orders ED Discharge Orders     None         Dorie Rank, MD 11/10/21 1511

## 2021-11-10 NOTE — Discharge Instructions (Signed)
Continue your current antibiotics.  Follow-up with the infectious disease doctor as planned

## 2021-11-11 ENCOUNTER — Ambulatory Visit
Admission: RE | Admit: 2021-11-11 | Discharge: 2021-11-11 | Disposition: A | Discharge status: Home / Self Care | Payer: Commercial Managed Care - HMO | Source: Ambulatory Visit | Attending: Physician Assistant | Admitting: Physician Assistant

## 2021-11-11 ENCOUNTER — Encounter: Payer: Self-pay | Admitting: *Deleted

## 2021-11-11 ENCOUNTER — Inpatient Hospital Stay: Admission: RE | Admit: 2021-11-11 | Payer: Commercial Managed Care - HMO | Source: Ambulatory Visit

## 2021-11-11 ENCOUNTER — Other Ambulatory Visit: Payer: Commercial Managed Care - HMO

## 2021-11-11 DIAGNOSIS — N12 Tubulo-interstitial nephritis, not specified as acute or chronic: Secondary | ICD-10-CM

## 2021-11-11 HISTORY — PX: IR RADIOLOGIST EVAL & MGMT: IMG5224

## 2021-11-11 NOTE — Progress Notes (Signed)
Referring Physician(s): Boisseau,Hayley  Chief Complaint: The patient is seen in follow up today s/p right renal abscess drain placed 10/22/21 by Dr. Laurence Ferrari.   History of present illness:  [name], 56 year old female, has a medical history significant for CAD with STEMI s/p heart cath, DM, fibromyalgia, NASH and prior splenectomy. She presented to the [facility] ED 10/04/21 with nausea, vomiting and palpitations. 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 She was treated conservatively for several days and repeat imaging showed no drainable perinephric fluid collection but because of worsening leukocytosis IR was consulted for possible intervention. An aspiration was performed on 09/23/21 and yielded 7 ml of purulent fluid. She remained hospitalized and repeat imaging 10/21/21 revealed multifocal fluid collections compatible with a renal abscess. She was seen in IR 10/22/21 for a medial right renal abscess drain and an aspiration of a smaller right renal fluid collection.   She presents today to the [facility] outpatient clinic for drain evaluation. CT imaging 11/10/21 shows near complete resolution of the renal abscess. Please see full report under the Imaging tab in Epic. She reports scant drain output for the past several days. She denies pain/discomfort, vomiting, diarrhea, fever or chills. She has occasional headaches and nausea. She is currently taking PO cefadroxil as prescribed by Infectious Disease.   Past Medical History:  Diagnosis Date   Anxiety    CAD (coronary artery disease)    a.  Inferior STEMI / CAD s/p DES to prox RCA 03/03/14, staged cath 03/05/14 for LAD showing significantly improved stenosis of mid LAD with resolution of prior thrombus.   Clostridium difficile colitis    Diabetes mellitus    Dyslipidemia    Fibromyalgia    GERD (gastroesophageal reflux disease)    H/O hiatal hernia    Ischemic cardiomyopathy    a. EF 45-50% by  echo 03/04/14 with suspected mild acute diastolic CHF s/p dose of IV Lasix.   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   Sinus bradycardia    a. During 02/2014 admit, not on BB due to this.   Upper respiratory infection 2/15   states has dry non productive cough since then- "tickle in my throat" no fever    Past Surgical History:  Procedure Laterality Date   ABDOMINAL HYSTERECTOMY     partial   CARDIAC CATHETERIZATION  03/05/14   improved LAD   COLONOSCOPY  12/07/2011   Rourk-normal rectum,, colon and terminal ileum   CORONARY ANGIOPLASTY WITH STENT PLACEMENT  03/03/14   DES to RCA,  ulcerated plaque in LAD   ESOPHAGOGASTRODUODENOSCOPY  08/27/2011   Rourk-reactive gastropathy, normal, patent esophagus. Stomach empty. Multiple linear antral erosions. No ulcer or infiltrating process. Small hiatal hernia. Patent pylorus. Normal first and second portion of the duodenum   LEFT HEART CATH Bilateral 03/03/2014   Procedure: LEFT HEART CATH;  Surgeon: Peter M Martinique, MD;  Location: [facility] CATH LAB;  Service: Cardiovascular;  Laterality: Bilateral;   PERCUTANEOUS CORONARY STENT INTERVENTION (PCI-S) N/A 03/05/2014   Procedure: PERCUTANEOUS CORONARY STENT INTERVENTION (PCI-S);  Surgeon: Troy Sine, MD;  Location: [facility] CATH LAB;  Service: Cardiovascular;  Laterality: N/A;   SHOULDER ARTHROSCOPY WITH OPEN ROTATOR CUFF REPAIR Left 03/02/2013   Procedure: LEFT SHOULDER ARTHROSCOPY, SUBACROMIAL DECOMPRESSION, MANIPULATION UNDER ANESTHESIA;  Surgeon: [name], MD;  Location: [facility]S;  Service: Orthopedics;  Laterality: Left;   SHOULDER OPEN ROTATOR CUFF REPAIR Left 07/27/2013   Procedure: LEFT MINI OPEN ROTATOR CUFF REPAIR ;  Surgeon: [name], MD;  Location: [facility]S;  Service: Orthopedics;  Laterality: Left;   SPLENECTOMY     TONSILLECTOMY      Allergies: Aspartame and  phenylalanine, Toradol [ketorolac tromethamine], Sucralose, Codeine, Dilaudid [hydromorphone hcl], Hydrocodone, and Oxycodone  Medications: Prior to Admission medications   Medication Sig Start Date End Date Taking? Authorizing Provider  acetaminophen (TYLENOL) 325 MG tablet Take 2 tablets (650 mg total) by mouth every 6 (six) hours as needed for mild pain (or Fever >/= 101). 10/28/21   Debbe Odea, MD  albuterol (VENTOLIN HFA) 108 (90 Base) MCG/ACT inhaler Inhale 2 puffs into the lungs every 4 (four) hours as needed for shortness of breath or wheezing. 05/27/21   [provider]  cefadroxil (DURICEF) 500 MG capsule Take 1 capsule (500 mg total) by mouth 2 (two) times daily for 15 days. 11/10/21 11/25/21  Laurice Record, MD  Cholecalciferol (VITAMIN D3) 1.25 MG (50000 UT) CAPS Take 50,000 Units by mouth every Sunday. 10/02/21   [provider]  DULoxetine (CYMBALTA) 60 MG capsule Take 60 mg by mouth at bedtime. 09/16/21   [provider]  gabapentin (NEURONTIN) 300 MG capsule Take 1 capsule (300 mg total) by mouth 3 (three) times daily. 10/28/21 11/27/21  Debbe Odea, MD  insulin aspart (NOVOLOG FLEXPEN) 100 UNIT/ML FlexPen Inject 1-15 Units into the skin 3 (three) times daily with meals. CBG 121 - 150: 2 units  CBG 151 - 200: 3 units  CBG 201 - 250: 5 units  CBG 251 - 300: 8 units  CBG 301 - 350: 11 units  CBG 351 - 400: 15 units 10/28/21   Debbe Odea, MD  Insulin Glargine (BASAGLAR KWIKPEN) 100 UNIT/ML Inject 35 Units into the skin at bedtime. Patient taking differently: Inject 35 Units into the skin at bedtime. SLIDING SCALE 10/28/21   Debbe Odea, MD  lisinopril (ZESTRIL) 10 MG tablet Take 10 mg by mouth daily. 09/16/21   [provider]  metFORMIN (GLUCOPHAGE) 1000 MG tablet Take 1,000 mg by mouth 2 (two) times daily. 09/07/21   [provider]  methocarbamol (ROBAXIN) 500 MG tablet Take 1 tablet (500 mg total) by mouth every 8 (eight) hours as needed  for muscle spasms. 10/28/21 11/27/21  Debbe Odea, MD  Multiple Vitamin (MULTIVITAMIN WITH MINERALS) TABS tablet Take 1 tablet by mouth daily. 10/29/21 11/28/21  Debbe Odea, MD  pantoprazole (PROTONIX) 40 MG tablet Take 1 tablet (40 mg total) by mouth daily. 10/29/21 11/28/21  Debbe Odea, MD  polyethylene glycol powder (GLYCOLAX/MIRALAX) 17 GM/SCOOP powder Take 17 g by mouth daily as needed for moderate constipation. 10/28/21   Debbe Odea, MD  Probiotic Product (PROBIOTIC PO) Take 1 capsule by mouth daily.    [provider]  senna-docusate (SENOKOT-S) 8.6-50 MG tablet Take 1 tablet by mouth 2 (two) times daily. 10/28/21 11/27/21  Debbe Odea, MD  simvastatin (ZOCOR) 40 MG tablet Take 40 mg by mouth at bedtime. 09/14/21   [provider]  sodium chloride flush 0.9 % SOLN injection Use 5 mLs by Intracatheter route daily for 20 days. 10/28/21 11/17/21  Boisseau, Angie Fava, PA  traMADol (ULTRAM) 50 MG tablet Take 1 tablet (50 mg total) by mouth every 8 (eight) hours as needed for up to 21 days for severe pain. 10/28/21 11/18/21  Debbe Odea, MD     Family History  Problem  Relation Age of Onset   Arthritis Mother    Diabetes Father    Kidney disease Father    Colon cancer Neg Hx     Social History   Socioeconomic History   Marital status: Legally Separated    Spouse name: Not on file   Number of children: 2   Years of education: Not on file   Highest education level: Not on file  Occupational History   Occupation: homemaker  Tobacco Use   Smoking status: Former    Packs/day: 0.25    Years: 30.00    Total pack years: 7.50    Types: Cigarettes    Quit date: 04/20/2006    Years since quitting: 15.5   Smokeless tobacco: Never  Substance and Sexual Activity   Alcohol use: Yes    Alcohol/week: 0.0 standard drinks of alcohol    Comment: rarely   Drug use: No   Sexual activity: Yes    Birth control/protection: None, Surgical  Other Topics Concern   Not on file  Social  History Narrative   Lives w/ husband & step daughter   Also has grandchildren from her son & daughter   Social Determinants of Health   Financial Resource Strain: Not on file  Food Insecurity: Not on file  Transportation Needs: Not on file  Physical Activity: Not on file  Stress: Not on file  Social Connections: Not on file     Vital Signs: There were no vitals taken for this visit.  Physical Exam Constitutional:      General: She is not in acute distress.    Appearance: She is ill-appearing.  Pulmonary:     Effort: Pulmonary effort is normal.  Abdominal:     Comments: Right renal abscess drain to suction. Approximately 5 ml of thin, clear, light yellow fluid in bulb. Skin insertion site is unremarkable. Suture and stat-lock in place.   Skin:    General: Skin is warm and dry.  Neurological:     Mental Status: She is alert and oriented to person, place, and time.     Imaging: CT Abdomen Pelvis W Contrast  Result Date: 11/10/2021 CLINICAL DATA:  Follow up renal abscess following percutaneous drainage 10/23/2021. EXAM: CT ABDOMEN AND PELVIS WITH CONTRAST TECHNIQUE: Multidetector CT imaging of the abdomen and pelvis was performed using the standard protocol following bolus administration of intravenous contrast. RADIATION DOSE REDUCTION: This exam was performed according to the departmental dose-optimization program which includes automated exposure control, adjustment of the mA and/or kV according to patient size and/or use of iterative reconstruction technique. CONTRAST:  151m OMNIPAQUE IOHEXOL 300 MG/ML  SOLN COMPARISON:  Prior CT 10/23/2021 and 10/21/2021. FINDINGS: Lower chest: Interval near complete resolution of previously demonstrated right pleural effusion. There is residual subsegmental atelectasis at both lung bases. Hepatobiliary: The liver is normal in density without suspicious focal abnormality. No evidence of gallstones, gallbladder wall thickening or biliary  dilatation. Chronic portal vein thrombosis with cavernous transformation. Pancreas: Unremarkable. No pancreatic ductal dilatation or surrounding inflammatory changes. 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 Adrenals/Urinary Tract: Both adrenal glands appear normal. Percutaneous drain is noted along the medial aspect of the upper pole of the right kidney. The previously demonstrated fluid collection in this area has nearly completely resolved, now measuring approximately 1.3 x 1.4 cm on image 13/8 (previously 3.9 x 3.5 cm). Small fluid collection laterally along the interpolar region of the right kidney now measures 1.6 x 1.1 cm on image 13/8 (previously  2.8 x 1.7 cm). No new or enlarging fluid collections are identified. There is heterogeneous enhancement of both kidneys. Delayed images demonstrate normal excretion into nondilated collecting systems bilaterally. No evidence of urinary tract calculus. The bladder appears unremarkable for its degree of distention. Stomach/Bowel: No enteric contrast administered. The stomach appears unremarkable for its degree of distension. No evidence of bowel wall thickening, distention or surrounding inflammatory change. The appendix appears normal. Vascular/Lymphatic: Multiple prominent lymph nodes are again noted in the porta hepatis and upper retroperitoneum which are similar to the previous study, likely reactive. Diffuse aortic and branch vessel atherosclerosis without evidence of aneurysm or large vessel occlusion. There is chronic cavernous transformation of the portal vein. Reproductive: Hysterectomy.  No adnexal mass. Other: No enlarging intra-abdominal fluid collections, ascites or free air. Generalized soft tissue edema has improved. Musculoskeletal: No acute or significant osseous findings. Lower lumbar spondylosis with disc space narrowing and vacuum phenomenon at L5-S1. IMPRESSION: 1. Interval near complete evacuation of  previously demonstrated perinephric abscess along the medial aspect of the upper pole of the right kidney. Small collection laterally has also improved. 2. Heterogeneous enhancement of both kidneys consistent with pyelonephritis. No new or enlarging collections identified. No hydronephrosis or delayed contrast excretion. 3. Interval near complete resolution of previously demonstrated right pleural effusion with improved aeration of the right lung base. 4. Stable prominent lymph nodes in the upper abdomen, likely reactive. 5. Chronic portal vein cavernous transformation. 6.  Aortic Atherosclerosis (ICD10-I70.0). Electronically Signed   By: Richardean Sale M.D.   On: 11/10/2021 13:02    Labs:  CBC: Recent Labs    10/24/21 1738 10/27/21 1045 11/07/21 1437 11/10/21 0952  WBC 13.6* 12.6* 12.3* 11.9*  HGB 7.9* 7.9* 10.1* 10.6*  HCT 25.0* 23.8* 31.1* 32.4*  PLT 772* 772* 764* 662*    COAGS: Recent Labs    10/04/21 2241 10/06/21 1808 10/07/21 0611 10/23/21 0808  INR 1.2 1.2 1.1 1.2  APTT  --  28  --   --     BMP: Recent Labs    10/21/21 0258 10/22/21 0301 10/24/21 1738 11/07/21 1437 11/10/21 0952  NA 133* 135 134* 133* 137  K 4.5 4.4 4.6 6.7* 4.5  CL 93* 95* 95* 95* 99  CO2 '28 27 30 26 31  '$ GLUCOSE 199* 337* 148* 332* 163*  BUN '14 15 15 '$ 35* 26*  CALCIUM 8.7* 8.5* 8.7* 10.1 9.6  CREATININE 1.06* 1.30* 1.30* 1.41* 1.25*  GFRNONAA >60 48* 48*  --  51*    LIVER FUNCTION TESTS: Recent Labs    10/20/21 0603 10/21/21 0258 10/22/21 0301 11/07/21 1437 11/10/21 0952  BILITOT 0.4 0.2* 0.4 0.3 0.4  AST 61* 51* 39 21 18  ALT 52* 52* 45* 27 24  ALKPHOS 151* 153* 139*  --  123  PROT 6.1* 6.3* 5.8* 8.2* 8.6*  ALBUMIN 1.7* 2.1* 1.8*  --  2.9*    Assessment:  Right renal abscess drain placed 10/22/21 by Dr. Laurence Ferrari  CT imaging obtained 11/10/21 shows near complete resolution of the right renal abscess. She reports scant output for the past several days and she is without  fevers, chills or pain. The drain was removed and the patient tolerated this well.  She was instructed to leave the dressing on until tomorrow and she can shower at that time. She was advised to avoid submerging the site in water for at least one week. She knows to call her doctor or go to the ED if she develops fevers,  chills, vomiting, or pain.   She has follow up appointments with Urology and Infectious disease and she was encouraged to keep these appointments. She knows she can call our clinic with any additional questions/concerns.    Signed: Theresa Duty, NP 11/11/2021, 4:18 PM   Please refer to Dr. Katrinka Blazing attestation of this note for management and plan.

## 2021-11-14 ENCOUNTER — Ambulatory Visit (INDEPENDENT_AMBULATORY_CARE_PROVIDER_SITE_OTHER): Payer: Commercial Managed Care - HMO | Admitting: Internal Medicine

## 2021-11-14 ENCOUNTER — Other Ambulatory Visit: Payer: Self-pay

## 2021-11-14 DIAGNOSIS — N151 Renal and perinephric abscess: Secondary | ICD-10-CM | POA: Diagnosis not present

## 2021-11-14 MED ORDER — CEFADROXIL 1 G PO TABS: 1.0000 g | ORAL_TABLET | Freq: Two times a day (BID) | ORAL | 0 refills | Status: DC

## 2021-11-14 NOTE — Progress Notes (Addendum)
Subjective:    Patient ID: [name], female    DOB: 31-Jul-1965, 56 y.o.   MRN: [id number]   Virtual Visit via Telephone/Video Note   I connected withNAME@ on 11/14/2021 at 9:02 AM by telephone and verified that I am speaking with the correct person using two identifiers.   I discussed the limitations, risks, security and privacy concerns of performing an evaluation and management service by telephone and the availability of in person appointments. I also discussed with the patient that there may be a patient responsible charge related to this service. The patient expressed understanding and agreed to proceed.  Location:  Patient: Home Provider: [facility]   HPI:  56 year old with past medical history as below including CAD, diabetes, NASH cirrhosis, C. 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  She was admitted to [facility] 6/18 - 7/11 for GI symptoms of nausea/vomiting/diarrhea found to have E. coli bacteremia secondary to emphysematous right-sided pyelonephritis. CT on 6/20  has showed small perirenal fluid collection with gas.   Repeat CT on 6/24 showed heterogenous perfusion of the left kidney suggesting pyelonephritis without fluid and emphysematous pyelonephritis right with interval decrease in gas.  collection/abscess, cultures positive E. coli. On 6/26 7 mL of purulent fluid was aspirated from right kidney, unable to leave drain in. She had worsened left flank pain.  Repeat CT on 7/4 showed multifocal fluid attenuating structure in the right kidney consistent with renal abscess secondary to emphysematous pyelonephritis.  She continues to have right-sided pain on day 19 of antibiotics without resolution and leukocytosis of abscess IR was engaged and drain placed.   On 7/6 IR aspirated to amount of purulent fluid sent for cultures, drain placed cultures no growth.  She received ceftriaxone  inpatient and discharged on cefadroxil 500 mg p.o. twice daily to complete 2 weeks of antibiotics EOT 7/26 7/21: Pt reports she continues to drain form her right flank drain although decreased(1.5 ml/day over the last couple days). Denies fever, chills. She reports her back on right side persists.  Interim ESR, Crp elevated with K 6.7 from last visit lab. Pt went to ED on 7/24 due to abnormal labs.  Repeat labs showed WBC 11.9 K, potassium 4.5, magnesium 1.6.  CT showed evacuation of perinephric abscess along the upper pole of the right kidney, small collection laterally has also improved measuring 1.6X 1.1 cm previously 2.8X 1.7cm.  She was seen by radiology on 7/25 and drain was called. Televisit 7/28: Pt reports pt is feeling well. Denies fever, chills. Reprots right sided pain has improved.   Allergies  Allergen Reactions   Aspartame And Phenylalanine Anaphylaxis, Shortness Of Breath and Swelling    Throat swells  ANY ARTIFICAL SWEETNERS   Toradol [Ketorolac Tromethamine] Other (See Comments)    Face & neck flushed red & pt felt very hot after IV adm.   Sucralose Rash    Pt.s face broke out in rash after drinking Breeza.  Pt. Claims it is from the artificial sweetener.      Codeine Swelling   Dilaudid [Hydromorphone Hcl] Itching   Hydrocodone Itching   Oxycodone Itching      Outpatient Medications Prior to Visit  Medication Sig Dispense Refill   acetaminophen (TYLENOL) 325 MG tablet Take 2 tablets (650 mg total) by mouth every 6 (six) hours as needed for mild pain (or Fever >/= 101).     albuterol (VENTOLIN HFA) 108 (  90 Base) MCG/ACT inhaler Inhale 2 puffs into the lungs every 4 (four) hours as needed for shortness of breath or wheezing.     cefadroxil (DURICEF) 500 MG capsule Take 1 capsule (500 mg total) by mouth 2 (two) times daily for 15 days. 30 capsule 0   Cholecalciferol (VITAMIN D3) 1.25 MG (50000 UT) CAPS Take 50,000 Units by mouth every Sunday.     DULoxetine (CYMBALTA) 60  MG capsule Take 60 mg by mouth at bedtime.     gabapentin (NEURONTIN) 300 MG capsule Take 1 capsule (300 mg total) by mouth 3 (three) times daily. 90 capsule 0   insulin aspart (NOVOLOG FLEXPEN) 100 UNIT/ML FlexPen Inject 1-15 Units into the skin 3 (three) times daily with meals. CBG 121 - 150: 2 units  CBG 151 - 200: 3 units  CBG 201 - 250: 5 units  CBG 251 - 300: 8 units  CBG 301 - 350: 11 units  CBG 351 - 400: 15 units 15 mL 11   Insulin Glargine (BASAGLAR KWIKPEN) 100 UNIT/ML Inject 35 Units into the skin at bedtime. (Patient taking differently: Inject 35 Units into the skin at bedtime. SLIDING SCALE)     lisinopril (ZESTRIL) 10 MG tablet Take 10 mg by mouth daily.     metFORMIN (GLUCOPHAGE) 1000 MG tablet Take 1,000 mg by mouth 2 (two) times daily.     methocarbamol (ROBAXIN) 500 MG tablet Take 1 tablet (500 mg total) by mouth every 8 (eight) hours as needed for muscle spasms. 90 tablet 0   Multiple Vitamin (MULTIVITAMIN WITH MINERALS) TABS tablet Take 1 tablet by mouth daily. 30 tablet 0   pantoprazole (PROTONIX) 40 MG tablet Take 1 tablet (40 mg total) by mouth daily. 30 tablet 0   polyethylene glycol powder (GLYCOLAX/MIRALAX) 17 GM/SCOOP powder Take 17 g by mouth daily as needed for moderate constipation. 238 g 0   Probiotic Product (PROBIOTIC PO) Take 1 capsule by mouth daily.     senna-docusate (SENOKOT-S) 8.6-50 MG tablet Take 1 tablet by mouth 2 (two) times daily. 60 tablet 0   simvastatin (ZOCOR) 40 MG tablet Take 40 mg by mouth at bedtime.     sodium chloride flush 0.9 % SOLN injection Use 5 mLs by Intracatheter route daily for 20 days. 200 mL 0   traMADol (ULTRAM) 50 MG tablet Take 1 tablet (50 mg total) by mouth every 8 (eight) hours as needed for up to 21 days for severe pain. 60 tablet 0   No facility-administered medications prior to visit.     Past Medical History:  Diagnosis Date   Anxiety    CAD (coronary artery disease)    a.  Inferior STEMI / CAD s/p DES to prox  RCA 03/03/14, staged cath 03/05/14 for LAD showing significantly improved stenosis of mid LAD with resolution of prior thrombus.   Clostridium difficile colitis    Diabetes mellitus    Dyslipidemia    Fibromyalgia    GERD (gastroesophageal reflux disease)    H/O hiatal hernia    Ischemic cardiomyopathy    a. EF 45-50% by echo 03/04/14 with suspected mild acute diastolic CHF s/p dose of IV Lasix.   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   Sinus bradycardia    a. During 02/2014 admit, not on BB due to this.   Upper respiratory infection 2/15   states  has dry non productive cough since then- "tickle in my throat" no fever     Past Surgical History:  Procedure Laterality Date   ABDOMINAL HYSTERECTOMY     partial   CARDIAC CATHETERIZATION  03/05/14   improved LAD   COLONOSCOPY  12/07/2011   Rourk-normal rectum,, colon and terminal ileum   CORONARY ANGIOPLASTY WITH STENT PLACEMENT  03/03/14   DES to RCA,  ulcerated plaque in LAD   ESOPHAGOGASTRODUODENOSCOPY  08/27/2011   Rourk-reactive gastropathy, normal, patent esophagus. Stomach empty. Multiple linear antral erosions. No ulcer or infiltrating process. Small hiatal hernia. Patent pylorus. Normal first and second portion of the duodenum   IR RADIOLOGIST EVAL & MGMT  11/11/2021   LEFT HEART CATH Bilateral 03/03/2014   Procedure: LEFT HEART CATH;  Surgeon: Peter M Martinique, MD;  Location: [facility] CATH LAB;  Service: Cardiovascular;  Laterality: Bilateral;   PERCUTANEOUS CORONARY STENT INTERVENTION (PCI-S) N/A 03/05/2014   Procedure: PERCUTANEOUS CORONARY STENT INTERVENTION (PCI-S);  Surgeon: Troy Sine, MD;  Location: [facility] CATH LAB;  Service: Cardiovascular;  Laterality: N/A;   SHOULDER ARTHROSCOPY WITH OPEN ROTATOR CUFF REPAIR Left 03/02/2013   Procedure: LEFT SHOULDER ARTHROSCOPY, SUBACROMIAL DECOMPRESSION, MANIPULATION UNDER  ANESTHESIA;  Surgeon: [name], MD;  Location: [facility];  Service: Orthopedics;  Laterality: Left;   SHOULDER OPEN ROTATOR CUFF REPAIR Left 07/27/2013   Procedure: LEFT MINI OPEN ROTATOR CUFF REPAIR ;  Surgeon: [name], MD;  Location: [facility];  Service: Orthopedics;  Laterality: Left;   SPLENECTOMY     TONSILLECTOMY         Review of Systems    Objective:    Nursing note and vital signs reviewed.     Assessment & Plan:  #E. coli bacteremia 2/2 right sided pyelonephritis with aspirate Cx+ Ecoli on 6/26 #Emphysematous right-sided pyelonephritis  status post IR PERC drain placement on 7/6 with negative Cx She was treated with ceftriaxone while inpatient and switched to cefadroxil 500 mg PO bid on discharge to complete 3 weeks of antibiotics and drain placement EOT 7/26 She had multiple CTs during hospitalization with last CT on 7/4 showing multifocal fluid attenuating structures in right kidney c/w renal abscesses 2/2 emphysematous cystitis, left sided pyelonephritis. She underwent Perc drain placement in the setting of leukocytosis and continued flank pain.  Labs on 7/21 visit showed WBC12.3 K, potassium 6.7, ESR 112, CRP 18.1.  Patient went to the ED due to elevated potassium.  Repeat potassium was 4.5, WBC 11.9 K.  CT showed resolution of perinephric abscess along medial aspect of the right kidney, small collection measuring 1.6X 1.1 cm previously 2.8X 1.6 cm.  Plan: -Increase cefadroxil  1 g p.o. twice daily(524m PO bid) given leukocytosis and elevated crp and ESR with small residual fluid collection. -Follow-up  Aug 11 th with repeat labs     I provided 23   minutes of non-face-to-face time during this encounter.

## 2021-11-17 ENCOUNTER — Telehealth: Payer: Commercial Managed Care - HMO | Admitting: Internal Medicine

## 2021-11-28 ENCOUNTER — Other Ambulatory Visit: Payer: Self-pay

## 2021-11-28 ENCOUNTER — Ambulatory Visit (INDEPENDENT_AMBULATORY_CARE_PROVIDER_SITE_OTHER): Payer: Commercial Managed Care - HMO | Admitting: Internal Medicine

## 2021-11-28 ENCOUNTER — Encounter: Payer: Self-pay | Admitting: Internal Medicine

## 2021-11-28 VITALS — BP 118/81 | HR 92 | Temp 98.0°F | Ht 62.0 in | Wt 147.0 lb

## 2021-11-28 DIAGNOSIS — N151 Renal and perinephric abscess: Secondary | ICD-10-CM | POA: Diagnosis not present

## 2021-11-28 DIAGNOSIS — N760 Acute vaginitis: Secondary | ICD-10-CM | POA: Diagnosis not present

## 2021-11-28 DIAGNOSIS — L0291 Cutaneous abscess, unspecified: Secondary | ICD-10-CM

## 2021-11-28 DIAGNOSIS — K859 Acute pancreatitis without necrosis or infection, unspecified: Secondary | ICD-10-CM

## 2021-11-28 NOTE — Progress Notes (Unsigned)
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   Thrombocytopenia ([place]) 10/05/2021   AKI (acute kidney injury) ([place]) 10/05/2021   Elevated troponin 10/05/2021   Acidosis 10/05/2021   Cirrhosis ([place]) 10/05/2021   Septic shock ([place]) 10/05/2021   Severe sepsis ([place])    Urinary tract infection without hematuria    Depression 09/21/2014   Cough 09/19/2014   Dyspnea 05/10/2014   Angina decubitus ([place]) 03/28/2014   Memory loss 03/28/2014   Bleeding gums 03/28/2014   Sinus bradycardia    Ischemic cardiomyopathy    CAD (coronary artery disease)    Hypokalemia 03/05/2014   Essential hypertension    ST elevation myocardial infarction (STEMI) involving right coronary artery with complication ([place]) 63/78/5885   Left rotator cuff tear 07/27/2013   Complete rotator cuff tear of left shoulder 07/27/2013   Otalgia 05/25/2013   Rotator cuff tear, non-traumatic 03/02/2013   Weakness generalized 09/06/2012   Fall 09/06/2012   Adjustment disorder with depressed mood 09/06/2012   Dizziness and giddiness 07/25/2012   Fibromyalgia 05/12/2012   Asplenia 05/12/2012   Diarrhea 02/77/4128   Nonalcoholic fatty liver disease 01/07/2012   GERD (gastroesophageal reflux disease) 09/15/2011   Hyperlipidemia    Lateral epicondylitis  of elbow 12/17/2008    Patient's Medications  New Prescriptions   No medications on file  Previous Medications   ACETAMINOPHEN (TYLENOL) 325 MG TABLET    Take 2 tablets (650 mg total) by mouth every 6 (six) hours as needed for mild pain (or  Fever >/= 101).   ALBUTEROL (VENTOLIN HFA) 108 (90 BASE) MCG/ACT INHALER    Inhale 2 puffs into the lungs every 4 (four) hours as needed for shortness of breath or wheezing.   CEFADROXIL (DURICEF) 1 G TABLET    Take 1 tablet (1 g total) by mouth 2 (two) times daily.   CHOLECALCIFEROL (VITAMIN D3) 1.25 MG (50000 UT) CAPS    Take 50,000 Units by mouth every Sunday.   DULOXETINE (CYMBALTA) 60 MG CAPSULE    Take 60 mg by mouth at bedtime.   GABAPENTIN (NEURONTIN) 300 MG CAPSULE    Take 1 capsule (300 mg total) by mouth 3 (three) times daily.   INSULIN ASPART (NOVOLOG FLEXPEN) 100 UNIT/ML FLEXPEN    Inject 1-15 Units into the skin 3 (three) times daily with meals. CBG 121 - 150: 2 units  CBG 151 - 200: 3 units  CBG 201 - 250: 5 units  CBG 251 - 300: 8 units  CBG 301 - 350: 11 units  CBG 351 - 400: 15 units   INSULIN GLARGINE (BASAGLAR KWIKPEN) 100 UNIT/ML    Inject 35 Units into the skin at bedtime.   LISINOPRIL (ZESTRIL) 10 MG TABLET    Take 10 mg by mouth daily.   METFORMIN (GLUCOPHAGE) 1000 MG TABLET    Take 1,000 mg by mouth 2 (two) times daily.   MULTIPLE VITAMIN (MULTIVITAMIN WITH MINERALS) TABS TABLET    Take 1 tablet by mouth daily.   PANTOPRAZOLE (PROTONIX) 40 MG TABLET    Take 1  tablet (40 mg total) by mouth daily.   POLYETHYLENE GLYCOL POWDER (GLYCOLAX/MIRALAX) 17 GM/SCOOP POWDER    Take 17 g by mouth daily as needed for moderate constipation.   PROBIOTIC PRODUCT (PROBIOTIC PO)    Take 1 capsule by mouth daily.   SIMVASTATIN (ZOCOR) 40 MG TABLET    Take 40 mg by mouth at bedtime.  Modified Medications   No medications on file  Discontinued Medications   No medications on file    Subjective: 56 year old with past medical history as below including CAD, diabetes, NASH cirrhosis, C. 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  She was admitted to [facility] 6/18 - 7/11 for GI symptoms of  nausea/vomiting/diarrhea found to have E. coli bacteremia secondary to emphysematous right-sided pyelonephritis. CT on 6/20  has showed small perirenal fluid collection with gas.   Repeat CT on 6/24 showed heterogenous perfusion of the left kidney suggesting pyelonephritis without fluid and emphysematous pyelonephritis right with interval decrease in gas.  collection/abscess, cultures positive E. coli. On 6/26 7 mL of purulent fluid was aspirated from right kidney, unable to leave drain in. She had worsened left flank pain.  Repeat CT on 7/4 showed multifocal fluid attenuating structure in the right kidney consistent with renal abscess secondary to emphysematous pyelonephritis.  She continues to have right-sided pain on day 19 of antibiotics without resolution and leukocytosis of abscess IR was engaged and drain placed.   On 7/6 IR aspirated to amount of purulent fluid sent for cultures, drain placed cultures no growth.  She received ceftriaxone inpatient and discharged on cefadroxil 500 mg p.o. twice daily to complete 2 weeks of antibiotics EOT 7/26 7/21: Pt reports she continues to drain form her right flank drain although decreased(1.5 ml/day over the last couple days). Denies fever, chills. She reports her back on right side persists.  Interim ESR, Crp elevated with K 6.7 from last visit lab. Pt went to ED on 7/24 due to abnormal labs.  Repeat labs showed WBC 11.9 K, potassium 4.5, magnesium 1.6.  CT showed evacuation of perinephric abscess along the upper pole of the right kidney, small collection laterally has also improved measuring 1.6X 1.1 cm previously 2.8X 1.7cm.  She was seen by radiology on 7/25 and drain was called. Televisit 7/28: Pt reports pt is feeling well. Denies fever, chills. Reprots right sided pain has improved.  ESR, crp and mild leukocytosis with small residual collection. Plan to   Today 11/28/21: Pt denies fevers and chills. She reports feeling well.   Review of  Systems: ROS  Past Medical History:  Diagnosis Date   Anxiety    CAD (coronary artery disease)    a.  Inferior STEMI / CAD s/p DES to prox RCA 03/03/14, staged cath 03/05/14 for LAD showing significantly improved stenosis of mid LAD with resolution of prior thrombus.   Clostridium difficile colitis    Diabetes mellitus    Dyslipidemia    Fibromyalgia    GERD (gastroesophageal reflux disease)    H/O hiatal hernia    Ischemic cardiomyopathy    a. EF 45-50% by echo 03/04/14 with suspected mild acute diastolic CHF s/p dose of IV Lasix.   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   Sinus bradycardia    a. During 02/2014 admit, not on BB  due to this.   Upper respiratory infection 2/15   states has dry non productive cough since then- "tickle in my throat" no fever    Social History   Tobacco Use   Smoking status: Former    Packs/day: 0.25    Years: 30.00    Total pack years: 7.50    Types: Cigarettes    Quit date: 04/20/2006    Years since quitting: 15.6   Smokeless tobacco: Never  Substance Use Topics   Alcohol use: Not Currently    Comment: rarely   Drug use: No    Family History  Problem Relation Age of Onset   Arthritis Mother    Diabetes Father    Kidney disease Father    Colon cancer Neg Hx     Allergies  Allergen Reactions   Aspartame And Phenylalanine Anaphylaxis, Shortness Of Breath and Swelling    Throat swells  ANY ARTIFICAL SWEETNERS   Toradol [Ketorolac Tromethamine] Other (See Comments)    Face & neck flushed red & pt felt very hot after IV adm.   Sucralose Rash    Pt.s face broke out in rash after drinking Breeza.  Pt. Claims it is from the artificial sweetener.      Codeine Swelling   Dilaudid [Hydromorphone Hcl] Itching   Hydrocodone Itching   Oxycodone Itching    Health Maintenance  Topic Date Due   COVID-19 Vaccine (1) Never  done   OPHTHALMOLOGY EXAM  Never done   Meningococcal B Vaccine (1 of 4 - Increased Risk) 01/19/1975   TETANUS/TDAP  Never done   FOOT EXAM  05/25/2014   Zoster Vaccines- Shingrix (1 of 2) Never done   MAMMOGRAM  05/14/2017   INFLUENZA VACCINE  11/18/2021   COLONOSCOPY (Pts 45-10yr Insurance coverage will need to be confirmed)  12/06/2021   HEMOGLOBIN A1C  04/06/2022   Hepatitis C Screening  Completed   HIV Screening  Completed   HPV VACCINES  Aged Out    Objective:  Vitals:   11/28/21 1020  Weight: 147 lb (66.7 kg)  Height: 5' 2"  (1.575 m)   Body mass index is 26.89 kg/m.  Physical Exam  Lab Results Lab Results  Component Value Date   WBC 11.9 (H) 11/10/2021   HGB 10.6 (L) 11/10/2021   HCT 32.4 (L) 11/10/2021   MCV 95.0 11/10/2021   PLT 662 (H) 11/10/2021    Lab Results  Component Value Date   CREATININE 1.25 (H) 11/10/2021   BUN 26 (H) 11/10/2021   NA 137 11/10/2021   K 4.5 11/10/2021   CL 99 11/10/2021   CO2 31 11/10/2021    Lab Results  Component Value Date   ALT 24 11/10/2021   AST 18 11/10/2021   ALKPHOS 123 11/10/2021   BILITOT 0.4 11/10/2021    Lab Results  Component Value Date   CHOL 259 (H) 03/03/2014   HDL 38 (L) 03/03/2014   LDLCALC 198 (H) 03/03/2014   TRIG 114 03/03/2014   CHOLHDL 6.8 03/03/2014   No results found for: "LABRPR", "RPRTITER" No results found for: "HIV1RNAQUANT", "HIV1RNAVL", "CD4TABS"   A/P #E. coli bacteremia 2/2 right sided pyelonephritis with aspirate Cx+ Ecoli on 6/26 #Emphysematous right-sided pyelonephritis  status post IR PERC drain placement on 7/6 with negative Cx -She had multiple CTs during hospitalization with last CT on 7/4 showing multifocal fluid attenuating structures in right kidney c/w renal abscesses 2/2 emphysematous cystitis, left sided pyelonephritis. She underwent Perc drain placement in the setting of  leukocytosis and continued flank pain.  Labs on 7/21 visit showed WBC12.3 K, potassium 6.7, ESR  112, CRP 18.1. -Patient went to the ED on 7/24 due to elevated potassium.  Repeat potassium was 4.5, WBC 11.9 K.  CT showed resolution of perinephric abscess along medial aspect of the right kidney, small collection measuring 1.6X 1.1 cm previously 2.8X 1.6 cm. Drains out on 7/25.  -She was treated with ceftriaxone while inpatient and switched to cefadroxil 500 mg PO bid on discharge to complete 3 weeks of antibiotics and drain placement EOT 7/26. At 7/28 ID visit cefadroxil increased to 1gm bid in the setting of leukocytosis and residual abscess. She has completed about 5 weeks of antibiotics, as such will stop. Plan: -D/C cefadroxil -Labs today cbc, cmp, esr, crp. If she has persistent leukocytosis then consider re-imaging.  -Follow-up  PRN       Laurice Record, MD [place] 11/28/2021, 10:21 AM

## 2021-11-29 LAB — CBC WITH DIFFERENTIAL/PLATELET
Absolute Monocytes: 1018 cells/uL — ABNORMAL HIGH (ref 200–950)
Basophils Absolute: 214 cells/uL — ABNORMAL HIGH (ref 0–200)
Basophils Relative: 1.6 %
Eosinophils Absolute: 1072 cells/uL — ABNORMAL HIGH (ref 15–500)
Eosinophils Relative: 8 %
HCT: 38.9 % (ref 35.0–45.0)
Hemoglobin: 13 g/dL (ref 11.7–15.5)
Lymphs Abs: 3886 cells/uL (ref 850–3900)
MCH: 30.6 pg (ref 27.0–33.0)
MCHC: 33.4 g/dL (ref 32.0–36.0)
MCV: 91.5 fL (ref 80.0–100.0)
MPV: 10.7 fL (ref 7.5–12.5)
Monocytes Relative: 7.6 %
Neutro Abs: 7209 cells/uL (ref 1500–7800)
Neutrophils Relative %: 53.8 %
Platelets: 483 10*3/uL — ABNORMAL HIGH (ref 140–400)
RBC: 4.25 10*6/uL (ref 3.80–5.10)
RDW: 14.6 % (ref 11.0–15.0)
Total Lymphocyte: 29 %
WBC: 13.4 10*3/uL — ABNORMAL HIGH (ref 3.8–10.8)

## 2021-11-29 LAB — COMPLETE METABOLIC PANEL WITH GFR
AG Ratio: 1 (calc) (ref 1.0–2.5)
ALT: 18 U/L (ref 6–29)
AST: 16 U/L (ref 10–35)
Albumin: 4 g/dL (ref 3.6–5.1)
Alkaline phosphatase (APISO): 104 U/L (ref 37–153)
BUN: 18 mg/dL (ref 7–25)
CO2: 29 mmol/L (ref 20–32)
Calcium: 10.1 mg/dL (ref 8.6–10.4)
Chloride: 97 mmol/L — ABNORMAL LOW (ref 98–110)
Creat: 0.99 mg/dL (ref 0.50–1.03)
Globulin: 4.2 g/dL (calc) — ABNORMAL HIGH (ref 1.9–3.7)
Glucose, Bld: 211 mg/dL — ABNORMAL HIGH (ref 65–99)
Potassium: 5.4 mmol/L — ABNORMAL HIGH (ref 3.5–5.3)
Sodium: 135 mmol/L (ref 135–146)
Total Bilirubin: 0.3 mg/dL (ref 0.2–1.2)
Total Protein: 8.2 g/dL — ABNORMAL HIGH (ref 6.1–8.1)
eGFR: 67 mL/min/{1.73_m2} (ref >=60)

## 2021-11-29 LAB — C-REACTIVE PROTEIN: CRP: 2.3 mg/L (ref <8.0)

## 2021-11-29 LAB — SEDIMENTATION RATE: Sed Rate: 53 mm/h — ABNORMAL HIGH (ref 0–30)

## 2021-12-03 ENCOUNTER — Other Ambulatory Visit: Payer: Self-pay | Admitting: Internal Medicine

## 2021-12-03 ENCOUNTER — Encounter: Payer: Self-pay | Admitting: *Deleted

## 2021-12-03 DIAGNOSIS — N151 Renal and perinephric abscess: Secondary | ICD-10-CM

## 2023-01-14 NOTE — Progress Notes (Signed)
 [facility]  [place]-  RETURN VISIT      PRIMARY CARE PROVIDER:   Malachy Burnard Helling, NP [address] [place] [facility] HEALTH DEPT [place] [address] [phone]                 [name], [name] 01/13/2023  DOB: [birth year] Age: 57 y.o.   PRIMARY CARDIOLOGIST : Patricia Matsu, MD  HPI   PATIENT PROFILE: [name]  is a 57 y.o. female who presents for evaluation and management of the following problems:  1. Coronary artery disease involving native coronary artery of native heart without angina pectoris   2. Mixed hyperlipidemia   3. Type 2 diabetes mellitus with diabetic chronic kidney disease, unspecified CKD stage, unspecified whether long term insulin  use (CMS/HHS-HCC)       CLINICAL SUMMARY: Interval Histories copied/summarized from previous clinic notes:  Upadhya 11/20/2022: This is a 57 year old with insulin  requiring diabetes mellitus, hypertension, history of STEMI  She had Inferior STEMI in 2015 and s/p DES to prox RCA 03/03/14, staged cath 03/05/14 for LAD showing significantly improved stenosis of mid LAD with resolution of prior thrombus. She also had HFREF  with LVEF 40-45%. Her DM is poorly controlled She has been having multiple falls. She says she clumsy and has peripheral neuropathy. She does not loose consciousness.She does not think this is mechanical fall. She also has palpitations. Which is sudden in onset and associated with chest pain. When this comes this last for 10 minutes. Feels tried She also gets sharp chest pain behind the left breast and lasts few seconds She denies shortness of breath, no orthopnea or PND No hospitalization for HF   INTERVAL HISTORY: 01/13/2023 [name]  [name] tot he clinic today for follow up.  She was last seen by D.r Foster in August.  Since that time she has been doing well.  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  She complains of fatigue and feels like she is so  fatigued that she sleeps a lot.  She is a type II diabetic and is trying to get set up with a endocrinologist.  She is to she her provider in [place] one more time so I will place a referral for her to endocrine today.  She is taking all her medications but has not started Jardiance because of the cost.  Overall she is feeling well and her blood pressure is 148/78 and at recheck is 135/71.  She is also taking 81 mg aspirin .       HISTORY   PROBLEM LIST: There is no problem list on file for this patient.   SOCIAL HX: Social History   Socioeconomic History   Marital status: Single  Tobacco Use   Smoking status: Every Day    Current packs/day: 0.50    Average packs/day: 0.5 packs/day for 46.7 years (23.4 ttl pk-yrs)    Types: Cigarettes    Start date: 3   Smokeless tobacco: Never  Substance and Sexual Activity   Alcohol use: Not Currently   Social Determinants of Health    Received from [employer]   Social Network    FAMILY HX: No family history on file.  CURRENT MEDICATIONS AND ALLERGIES   Current Outpatient Medications  Medication Instructions   atorvastatin  (LIPITOR ) 80 mg, Oral, Daily   DULoxetine  (CYMBALTA ) 60 mg, Oral, Daily   famotidine  (PEPCID ) 20 mg, Oral, 2 times Daily   gabapentin  (NEURONTIN ) 800 mg, Oral, 2 times  Daily   ibuprofen  (MOTRIN ) 100 mg/5 mL suspension Oral, Every 6 hours PRN   lisinopriL  (ZESTRIL ) 10 mg, Oral, Daily   metFORMIN  (GLUCOPHAGE ) 1,000 mg, Oral, 2 times Daily with meals   multivit-minerals/folic acid (ONE-A-DAY WOMEN'S 50 PLUS ORAL) Oral   Saccharomyces boulardii (FLORASTOR) 250 mg, Oral, 2 times Daily   spironolactone (ALDACTONE) 12.5 mg, Oral, Daily   Allergies  Allergen Reactions   Codeine Hives   Dilaudid  [Hydromorphone ] Hives   Morphine  Hives    REVIEW OF SYSTEMS  Comprehensive review of systems is otherwise negative except as indicated below and in the HPI.  PHYSICAL EXAM   Vitals:   01/13/23  1135  BP: 135/71  Pulse: 77  Resp:      Wt Readings from Last 3 Encounters:  01/13/23 70.8 kg (156 lb)  11/20/22 68 kg (150 lb)     BP Readings from Last 3 Encounters:  01/13/23 135/71  11/20/22 136/82       General Appearance:  Alert, cooperative, no distress, appears stated age  HEENT:  PERRL,oropharynx clear  Neck: Carotids 2+ bilaterally, no carotid bruits, JVD is normal  Lungs:   Clear to auscultation bilaterally, respirations unlabored  Heart:  Regular rate and rhythm, normal S1 and S2, no murmurs/rubs/gallops, non-displaced PMI, no RV heave  Abdomen:   Soft, non-tender, bowel sounds active,  no masses, no organomegaly  Extremities: Extremities normal, no cyanosis or edema.  Pulses: Dorsalis pedis 2+ and symmetric bilaterally  Skin: No lower extremity rashes or ulcers  Neurologic: Alert, interactive, and appropriate, grossly moving all 4 extremities   DATA/RESULTS   No results for input(s): CHOLTOTAL, HDL, LDLCALC, LDLDIRECT, LDL, VLDL, TRIG in the last 73719 hours.  Recent Labs    12/02/22 1335 01/13/23 1232  NA 135 139  K 4.8 4.6  CL 105 105  CO2 26 26  BUN 19 16  CREATININE 0.9 0.8  BUNCRE 21 20  GFR 75 86  GLUCOSE 360* 117    No results for input(s): WBC, HGB, HCT, PLT in the last 73719 hours.  No results for input(s): INR, TSH, HGBA1C, HBA1C, PROBNP, BNP in the last 73719 hours.   ASSESSMENT AND PLAN  1. Coronary artery disease involving native coronary artery of native heart without angina pectoris -     Basic Metabolic Panel (BMP)today -     Follow up in Cardiology in November -     Echo complete when she returns in November.    2. Mixed hyperlipidemia -     Basic Metabolic Panel (BMP) today -     Follow up in Cardiology in November  3. Type 2 diabetes mellitus with diabetic chronic kidney disease, unspecified CKD stage, unspecified whether long term insulin  use (CMS/HHS-HCC) -     Ambulatory Referral to  Endocrinology -     Follow up in Cardiology in November    Orders Placed This Encounter  Procedures   Basic Metabolic Panel (BMP)   Ambulatory Referral to Endocrinology   Echo complete    DISPOSITION   Follow up in November  Future Appointments     Date/Time Provider Department Center Visit Type   02/15/2023 8:00 AM Lynwood Allena Denmark, OD [facility] [place] NEW DIABETES   02/24/2023 10:15 AM (Arrive by 9:45 AM) [place] 4 CARD STRESS/ECHO 1 [facility] [place] 4th [facility] [place] ECHO COMPLETE   02/24/2023 11:00 AM (Arrive by 10:30 AM) Vicci Rosaline Glatter, NP [facility] [place] 4th [facility] [place] CARD RETURN  I spent a total of 30 minutes in both face-to-face and non-face-to-face activities, excluding procedures performed, for this visit on the date of this encounter.  Medication list was reviewed with patient, changes/additions updated at time of visit, and prescriptions refilled appropriately per e-prescribing system. There were no barriers to communication. She understands to call with any questions or concerns. She was educated regarding dx, tx plan, medications and possible side effects. She was educated regarding emergent signs and symptoms, and knows to notify our office immediately or seek emergent medical attention if necessary. She voiced understanding and agreement with tx plan.   Attestation Statement:   I personally performed the service, non-incident to. (WP)   MICHELLE MERILEE LOUDER, NP

## 2023-03-05 NOTE — Progress Notes (Signed)
 [facility]  CARDIOLOGY CLINIC AT [address]  OUTPATIENT VISIT DATE 03/05/2023  OUTPATIENT VISIT TYPE FOLLOW-UP VISIT   PRIMARY CARE PHYSICIAN: Malachy Burnard Helling, NP [address] [facility] [place] [address]  REFERRING PHYSICIAN: Vicci Rosaline Glatter, NP [address] [place],  [place] [address]   CHIEF COMPLAINT:  Chest pain with radiation to left arm CAD HFpEF Type II DM  HISTORY OF PRESENT ILLNESS:  Ms. [name] is a 57 y.o. year old woman who presents today to the [facility] at [employer] for follow-up of episode of left sided chest pain with radiation down left arm yesterday while resting.  She states it felt just like when she had her heart attack year ago.  She was at home relaxing, and had not been doing much during the day.  The pain came on in the evening and last 3-4 minutes before resolving on its own.  She has not had anything like this in several years.    Interval History Interval Histories copied/summarized from previous clinic notes:  01/13/23 Vicci: [name]  is a 57 y.o. female who presents for evaluation and management of the following problems:   1. Coronary artery disease involving native coronary artery of native heart without angina pectoris   2. Mixed hyperlipidemia   3. Type 2 diabetes mellitus with diabetic chronic kidney disease, unspecified CKD stage, unspecified whether long term insulin  use (CMS/HHS-HCC)     CLINICAL SUMMARY: Interval Histories copied/summarized from previous clinic notes:   Upadhya 11/20/2022: This is a 57 year old with insulin  requiring diabetes mellitus, hypertension, history of STEMI  She had Inferior STEMI in 2015 and s/p DES to prox RCA 03/03/14, staged cath 03/05/14 for LAD showing significantly improved stenosis of mid LAD with resolution of prior thrombus. She also had HFREF  with  LVEF 40-45%. Her DM is poorly controlled She has been having multiple falls. She says she clumsy and has peripheral neuropathy. She does not loose consciousness.She does not think this is mechanical fall. She also has palpitations. Which is sudden in onset and associated with chest pain. When this comes this last for 10 minutes. Feels tried She also gets sharp chest pain behind the left breast and lasts few seconds She denies shortness of breath, no orthopnea or PND No hospitalization for HF   INTERVAL HISTORY: 01/13/2023 [name]  [name] returns tot he clinic today for follow up.  She was last seen by D.r Dixon in August.  Since that time she has been doing well.  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  She complains of fatigue and feels like she is so fatigued that she sleeps a lot.  She is a type II diabetic and is trying to get set up with a endocrinologist.  She is to she her provider in [place] one more time so I will place a referral for her to endocrine today.  She is taking all her medications but has not started Jardiance because of the cost.  Overall she is feeling well and her blood pressure is 148/78 and at recheck is 135/71.  She is also taking 81 mg aspirin .      PAST MEDICAL AND SURGICAL HISTORY: There is no problem list on file for this patient.  Past Medical History:  Diagnosis Date   Coronary artery disease    Heart attack (CMS/HHS-HCC)    2015    No past surgical  history on file.   SOCIAL HISTORY: Social History   Tobacco Use  Smoking Status Every Day   Current packs/day: 0.50   Average packs/day: 0.5 packs/day for 46.9 years (23.4 ttl pk-yrs)   Types: Cigarettes   Start date: 1978  Smokeless Tobacco Never   Social History   Socioeconomic History   Marital status: Single  Tobacco Use   Smoking status: Every Day    Current packs/day: 0.50    Average packs/day: 0.5 packs/day for 46.9 years (23.4 ttl pk-yrs)    Types:  Cigarettes    Start date: 19   Smokeless tobacco: Never  Vaping Use   Vaping status: Never Used  Substance and Sexual Activity   Alcohol use: Yes    Comment: occas]   Drug use: Never   Social Drivers of Health    Received from [employer]   Social Network    FAMILY HISTORY: No family history on file.  ALLERGIES: Allergies  Allergen Reactions   Codeine Hives   Dilaudid  [Hydromorphone ] Hives   Morphine  Hives    MEDICATIONS: Current Outpatient Medications  Medication Sig Dispense Refill   DULoxetine  (CYMBALTA ) 60 MG DR capsule Take 60 mg by mouth once daily     famotidine  (PEPCID ) 20 MG tablet Take 20 mg by mouth 2 (two) times daily     gabapentin  (NEURONTIN ) 800 MG tablet Take 800 mg by mouth 2 (two) times daily     ibuprofen  (MOTRIN ) 100 mg/5 mL suspension Take by mouth every 6 (six) hours as needed for Fever     metFORMIN  (GLUCOPHAGE ) 1000 MG tablet Take 1,000 mg by mouth 2 (two) times daily with meals     multivit-minerals/folic acid (ONE-A-DAY WOMEN'S 50 PLUS ORAL) Take by mouth     Saccharomyces boulardii (FLORASTOR) 250 mg capsule Take 250 mg by mouth 2 (two) times daily     spironolactone (ALDACTONE) 25 MG tablet Take 0.5 tablets (12.5 mg total) by mouth once daily 45 tablet 3   atorvastatin  (LIPITOR ) 80 MG tablet Take 80 mg by mouth once daily (Patient not taking: Reported on 03/05/2023)     lisinopriL  (ZESTRIL ) 10 MG tablet Take 10 mg by mouth once daily (Patient not taking: Reported on 03/05/2023)     No current facility-administered medications for this visit.   REVIEW OF SYSTEMS:   GENERAL HEALTH []  Reduced acitivity []  Appetite change []  Chills or fever []  Fatigue []  Unexpected weight gain/loss  EYES, EARS, NOSE, THROAT []  Dental problem []  Ear pain/ringing []  Hearing loss []  Facial swelling []  Nose bleeds []  Vision changes []  Trouble swallowing  RESPIRATORY []  Sleep apnea []  Snoring []  Sputum []  Cough []  Chest  tightness []  Shortness of breath []  Wheezing  CARDIOVASCULAR [x]  Chest pain or tightness []  Leg swelling []  Palpitations/heart flutters []  Waking up short of breath []  Sleeping on an incline/more than one pillow []  Leg pain when walking []  Sores on feet/legs []  Pain in feet at night  GASTROINTESTINAL []  Abdominal bloating/swelling []  Abdominal pain []  Red blood on toilet tissue or in bowl []  Blood in stool []  Constipation []  Diarrhea []  Nausea [] Vomiting URINARY []  Difficulty urinating []  Flank pain []  Frequent urination []  Blood in urine []  Nocturia need to urinate more then 1 time at night []  Decreased amount of urine  MUSCULOSKELETAL []  Joint aches that limit exercise []  Back pain that limits exercise []  Muscle aches that limit exercise  SKIN []  Color change []  Rash []  Wound  NEUROLOGIC []  Dizziness (room spinning) []   Headache []  Lightheadedness []  Numbness []  Weakness []  Speech difficulty []  Passing out []  Facial droop  HEMATOLOGIC []  Bruises/bleeds easily  PSYCHIATRIC []  Anxiety []  Confusion []  Depression []  Sleep disturbance []   Memory trouble  ENDOCRINE []  Thyroid  problems        10 of 14 systems reviewed were otherwise negative except as per HPI and above  PHYSICAL EXAMINATION: BP 111/78 (BP Location: Left upper arm, Patient Position: Sitting, BP Cuff Size: Adult)   Pulse 81   Resp 18   Ht 160 cm (5' 3)   Wt 74.6 kg (164 lb 8 oz)   SpO2 95%   BMI 29.14 kg/m  General Appearance:  Well appearing and in no acute distress looking approximately stated age  HEENT:  JVP: Normal with no JVD or HJR Carotids : Normal with normal upstroke and no bruits  LUNGS:   Breath Sounds: Normal with no rales or rhonchi Percussion: Normal and no evidence for egophony  CARDIOVASCULAR:  Regular Rate and Rhythm Palpation: Normal with no lifts, heaves or thrills evident Auscultation:  S1: Normal in intensity  S2: Normal in intensity with normal  splitting of the second sound  Clicks: None  Gallops: None  Murmurs: None  ABDOMEN:   Liver enlargement: No Pulsatile liver: No Pulsatile aorta: No Ascites: None Abdominal bruit: None    PERIPHERAL: Cyanosis: No Clubbing: No Edema: None Pulses: Normal peripheral pulses Femoral Bruits: None  SKIN: No lower extremity rashes or ulcers  NEUROLOGIC: Alert, interactive, and appropriate, grossly moving all 4 extremities   STUDIES: ECG 03/05/23: NSR 73bpm  ECHO 03/05/23: CONCLUSION ------------------------------------------------------------------------------- NORMAL LEFT VENTRICULAR SYSTOLIC FUNCTION WITH NO LVH ESTIMATED EF: >55%, CALC EF(3D): 72% NORMAL LA PRESSURES WITH NORMAL DIASTOLIC FUNCTION NORMAL RIGHT VENTRICULAR SYSTOLIC FUNCTION VALVULAR REGURGITATION: No AR, TRIVIAL MR, No PR, No TR NO VALVULAR STENOSIS ------------------------------------------------------------------------------------------ INSUFFICIENT TR TO ESTIMATE RVSP   Compared with prior Echo study on   12/02/2022 : NO SIGNIFICANT CHANGE    ECHOCARDIOGRAPHIC DESCRIPTIONS ----------------------------------------------------------- AORTIC ROOT         Asc Ao Size: Normal                                Dissection: INDETERMINATE FOR DISSECTION                                                AORTIC VALVE            Leaflets: Tricuspid                               Mobility: Fully Mobile                     Morphology: Normal                                                                                     AR: No AR  AS: No AS                               AV Mass: No Masses                      LEFT VENTRICLE                Size: Normal                                                                                    LVH: None                                 Contraction: Normal                                Closest EF: >55%                                    Calc. EF:  72%(3D)                         LV GLS (GE): -18.7%   Normal Range <-18%     Strain Analysis: GLS < -18% Global longitudinal strain is normal.                                    LV Mass: No Masses                          Dias. FxClass: Normal                                                                      WALL MOTION                            Basal             Mid               Apical               Anterior Septum: Normal            Normal            Normal                 Anterior Wall: Normal            Normal            Normal  Lateral Wall: Normal            Normal            Normal                Posterior Wall: Normal            Normal                                   Inferior Wall: Normal            Normal            Normal               Inferior Septum: Normal            Normal                               Rest Rest Score Index: 1.00     MITRAL VALVE            Leaflets: Normal                                  Mobility: Fully Mobile                     Morphology: Normal                                                                                      MR: TRIVIAL MR                                    MS: No MS                             MV masses: No Masses                      LEFT ATRIUM                Size: Normal                                                                              LA masses: No Masses                      MAIN PA                Size: Normal  Diameter: 1.9 cm                    PULMONIC VALVE            Leaflets: UNKNOWN                                 Mobility: Fully Mobile                     Morphology: Normal                                                            PR: No PR                                         PS: No PS                             PV masses: No Masses                      RIGHT VENTRICLE                Size: Normal                                 Free  Wall: Normal                          Contraction: Normal                                                         TAPSE: 2 cm                                                       RV masses: No Masses                      TRICUSPID VALVE            Leaflets: Normal                                  Mobility: Fully Mobile                     Morphology: Normal                                        TR: No TR  TS: No TS                             TV masses: No Masses                      RIGHT ATRIUM                Size: Normal                                 RA masses: No Masses                      PERICARDIUM               Fluid: No Effusion           INFERIOR VENA CAVA                Size: Normal                              End-Exp Diam: 1.5                                   Sniff Diam: 0.4                                  IVC CI: 73                                                                          Resp.Collapse: Normal Respiratory Collapse                                             RESTING ECHOCARDIOGRAPHIC MEASUREMENTS --------------------------------------------------- AORTA Measurements            Values    Units     Normal Range                               Aorta Sin: 3.1       cm        [2.4 - 3.6]                                Asc.Aorta: 2.8       cm        [1.9 - 3.5]                           Asc. Aorta BSA: 1.6       cm/m2     [1.0 - 2.2]                     LEFT VENTRICLE  LVIDd: 3.4       cm        [3.8 - 5.2]                                    LVIDs: 2         cm        [2.2 - 3.5]                                LVIDd/BSA: 2         cm/m2                                                      SWT: 0.8       cm        [0.6 - 0.9]                                      PWT: 0.8       cm        [0.6 - 0.9]                                 LV EF 3D: 72        %                                                     LV EDV 3D: 68        ml                                                  LV EDV 3Di: 39        ml/m2                                                LV ESV 3D: 19        ml                                                  LV ESV 3Di: 11        ml/m2                                     DIASTOLIC FUNCTION          MV Pk. E Vel.: 84        cm/s  MV Pk. A Vel.: 68.5      cm/s                                                    MV E/A: 1.2                                                                MV DT: 160       msec                                            MV Med E' Vel.: 9.4       cm/s                                               MV Med E/E': 8.9                                                        MV Lat E'Vel.: 11.6      cm/s                                               MV Lat E/E': 7.2                                                 LEFT ATRIUM                LA Diam: 3.7       cm        [2.7 - 3.8]                                  LA Area: 19        cm2       [<= 20]                                    LA Volume: 56        ml        [18 - 58]                                       LAVi: 32        ml/m2     [  16 - 34]                       MAIN PA                Main PA: 1.9       cm        [1.5 - 2.1]                     RIGHT VENTRICLE               RV TAPSE: 2         cm                                                     RV Base: 2.8       cm        [2.5 - 4.1]                                   RV Mid: 1.9       cm        [1.9 - 3.5]                     RIGHT ATRIUM                RA Area: 12.5      cm2       [ <= 20]                           RA ESV 2D MOD A4C: 28        ml                                                        RAVi: 16        ml/m2     [15 - 27]                       INFERIOR VENA CAVA                Max.IVC: 1.5       cm        [ <= 2.1]                                    Min.IVC: 0.4       cm        [ <=  1.7]                                     IVC CI: 73        %  Pressures, Gradients, and DOPPLER ECHO ---------------------------------------------------   Mitral Valve          MV Pk. E Vel.: 84        cm/s                                             MV Pk. A Vel.: 68.5      cm/s                                                    MV E/A: 1.2                                                    MV Annulus E'Vel.: 9.4       cm/s                                                 E/E'Ratio: 9                                                   3D acquisition and reconstructions were performed as part of this examination to more accurately quantify the effects of heart failure regardless of ejection fraction with independent workstation.          Perform By: Damien He, RCS                                                      Entered By: Damien He, RCS                                                     Res. Person: Damien He, RCS                                                 Electronically signed by Donzell CHRISTELLA Mort, M.D. on:03/05/2023 2:50:00 PM with status of Final  ECHO 12/02/22: AORTA          Values     Normal RangeMAIN PA      Values     Normal Range      Annulus:  nm*  cm    [1.9 - 2.7]    PA Main:   1.9 cm    [1.5 - 2.1]    Aorta Sin:   2.7 cm    [2.4 - 3.6]  RIGHT VENTRICLE  ST Junction:  nm*  cm    [2 - 3.2]      RV Base:   3.3 cm    [2.5 - 4.1]    Asc.Aorta:   2.8 cm    [1.9 - 3.5]     RV Mid:   2.2 cm    [1.9 - 3.5] LEFT VENTRICLE                         RV Length:  nm*  cm    [  ]        LVIDd:   3.9 cm    [3.7 - 5.3] RIGHT ATRIUM        LVIDs:   2.1 cm    [2.2 - 3.4]    RA Area:  10   cm2   [ <= 20]       LVEDVi:  52.0 ml/m2 [29 - 61]         RAVi:  11   ml/m2 [15 - 27]       LVESVi:  24.0 ml/m2 [8 - 24]    INFERIOR VENA CAVA           FS:  46   %     [ >= 25]       Max.IVC:   2.0 cm    [ <= 2.1]          SWT:   1.1 cm     [0.6 - 0.9]    Min.IVC:  nm*  cm    [ <= 1.7]          PWT:   1.1 cm    [0.6 - 0.9] __________________ LEFT ATRIUM                           nm* - not measured      LA Diam:   3.6 cm    [2.7 - 3.8]      LA Area:  18   cm2   [ <= 20]    LA Volume:  45   ml    [22 - 52]         LAVi:  26   ml/m2 [16 - 34]   ECHOCARDIOGRAPHIC DESCRIPTIONS ----------------------------------------------- AORTIC ROOT         Size: Normal   Dissection: INDETERM FOR DISSECTION   AORTIC VALVE     Leaflets: Tricuspid             Morphology: Normal     Mobility: Fully Mobile   LEFT VENTRICLE                                      Anterior: Normal         Size: Normal                                 Lateral: Normal  Contraction: Normal                                  Septal: Normal   Closest EF: >55%(Estimated)  Calc.EF: 54% (3D)      Apical: Normal    LV masses: No Masses  Inferior: Normal          LVH: MILD LVH                             Posterior: Normal  LV GLS(GE): -18.7% Normal Range [ <= -16] Dias.FxClass: Normal   MITRAL VALVE     Leaflets: Normal                  Mobility: Fully mobile   Morphology: Normal   LEFT ATRIUM         Size: Normal    LA masses: No masses               Normal IAS   MAIN PA         Size: Normal   PULMONIC VALVE   Morphology: Normal     Mobility: Fully Mobile   RIGHT VENTRICLE         Size: Normal                    Free wall: Normal  Contraction: Normal                    RV masses: No Masses        TAPSE:   1.7 cm,  Normal Range [>= 1.6 cm]      RV Note: RVS'= 13 cm/s   TRICUSPID VALVE     Leaflets: Normal                  Mobility: Fully mobile   Morphology: Normal   RIGHT ATRIUM         Size: Normal                     RA Other: None    RA masses: No masses   PERICARDIUM        Fluid: No effusion   INFERIOR VENACAVA         Size: Normal     Normal respiratory collapse   DOPPLER ECHO and OTHER SPECIAL PROCEDURES  ------------------------------------    Aortic: No AR                  No AS      Mitral: No MR                  No MS    MV Inflow E Vel.= 82.0 cm/s  MV Annulus E'Vel.= 11.0 cm/s  E/E'Ratio= 7   Tricuspid: No TR                  No TS   Pulmonary: No PR                  No PS       Other:   INTERPRETATION ---------------------------------------------------------------   NORMAL LEFT VENTRICULAR FUNCTION WITH MILD LVH   NORMAL LA PRESSURES WITH NORMAL DIASTOLIC FUNCTION   NORMAL RIGHT VENTRICULAR SYSTOLIC FUNCTION   NO VALVULAR REGURGITATION   NO VALVULAR STENOSIS   INSUFFICIENT TR TO ESTIMATE RVSP   NO PRIOR STUDY FOR COMPARISON   3D acquisition and reconstructions were performed as part of this   examination to more accurately quantify the effects of identified   structural abnormalities as part of the exam. (post-processing on an   Independent workstation).  Recent Labs    01/13/23 1232  NA 139  K 4.6  CL  105  CO2 26  CALCIUM  9.6  BUN 16  CREATININE 0.8  GFR 86  BUNCRE 20  ANIONGAP 8  GLUCOSE 117   No results for input(s): AST, ALT, ALKPHOS, TBILI, ALB, TOTALPROTEIN in the last 8760 hours. No results for input(s): HGBA1C in the last 8760 hours. No results for input(s): WBC, HGB, HCT, PLT, MCV, MCH, MCHC, RBC, RDWCV, MPV in the last 8760 hours. No results for input(s): CHOLTOTAL in the last 8760 hours. No results for input(s): TRIG in the last 8760 hours. No results for input(s): HDL in the last 8760 hours. No results for input(s): CHOLTOTAL, TRIG, HDL, LDLCALC, VLDL in the last 8760 hours. No results for input(s): PROBNP in the last 8760 hours. No results for input(s): BNP in the last 8760 hours. No results for input(s): TSH, T4FREE in the last 8760 hours.   STAFF IMPRESSION/RECOMMENDATIONS:  Elzina Devera is a 57 y.o. woman with CAD, Type II DM, who presents today to the [facility]  for follow-up of episode of chest pain.  Chest Pain: - NM Perfusion study ordered given history of CAD and episode of chest pain with radiation down left arm - continue atorvastatin  80mg  daily  HFrEF: - LVEF improved to 72% on today's ECHO (prior 54%) - continue spironolactone 12.5mg  daily  Follow-up will be based upon results of NM perfusion study.   I spent a total of 30 minutes in both face-to-face and non-face-to-face activities, excluding procedures performed, for this visit on the date of this encounter.     POWELL RASE, NP  [facility]  [facility]

## 2023-08-18 ENCOUNTER — Other Ambulatory Visit (HOSPITAL_COMMUNITY): Payer: Self-pay

## 2024-03-30 ENCOUNTER — Emergency Department

## 2024-03-30 ENCOUNTER — Emergency Department
Admission: EM | Admit: 2024-03-30 | Discharge: 2024-03-30 | Disposition: A | Discharge status: Home / Self Care | Attending: Emergency Medicine | Admitting: Emergency Medicine

## 2024-03-30 ENCOUNTER — Other Ambulatory Visit: Payer: Self-pay

## 2024-03-30 DIAGNOSIS — I2510 Atherosclerotic heart disease of native coronary artery without angina pectoris: Secondary | ICD-10-CM | POA: Diagnosis not present

## 2024-03-30 DIAGNOSIS — E119 Type 2 diabetes mellitus without complications: Secondary | ICD-10-CM | POA: Diagnosis not present

## 2024-03-30 DIAGNOSIS — I110 Hypertensive heart disease with heart failure: Secondary | ICD-10-CM | POA: Insufficient documentation

## 2024-03-30 DIAGNOSIS — S4992XA Unspecified injury of left shoulder and upper arm, initial encounter: Secondary | ICD-10-CM | POA: Diagnosis present

## 2024-03-30 DIAGNOSIS — W010XXA Fall on same level from slipping, tripping and stumbling without subsequent striking against object, initial encounter: Secondary | ICD-10-CM | POA: Diagnosis not present

## 2024-03-30 DIAGNOSIS — I509 Heart failure, unspecified: Secondary | ICD-10-CM | POA: Diagnosis not present

## 2024-03-30 DIAGNOSIS — S42292A Other displaced fracture of upper end of left humerus, initial encounter for closed fracture: Secondary | ICD-10-CM | POA: Diagnosis not present

## 2024-03-30 MED ORDER — TRAMADOL HCL 50 MG PO TABS
50.0000 mg | ORAL_TABLET | Freq: Once | ORAL | Status: AC
  Administered 2024-03-30: 50 mg via ORAL
  Filled 2024-03-30: qty 1

## 2024-03-30 MED ORDER — TRAMADOL HCL 50 MG PO TABS: 50.0000 mg | ORAL_TABLET | Freq: Four times a day (QID) | ORAL | 0 refills | Status: DC | PRN

## 2024-03-30 MED ORDER — ACETAMINOPHEN 500 MG PO TABS
500.0000 mg | ORAL_TABLET | Freq: Once | ORAL | Status: DC
  Filled 2024-03-30: qty 1

## 2024-03-30 NOTE — ED Triage Notes (Addendum)
 Pt arrives via POV with c/o left arm pain after a fall this morning where the pt fell backwards. Pt unsure if they hit their head and if they LOC. Pt is supporting arm in triage.   Pt took 1500mg  of tylenol  prior to arrival

## 2024-03-30 NOTE — ED Notes (Signed)
 See triage note Presents with pain to left upper arm  States she fell this am Pain is above the elbow  No deformity noted  Increase pain with movement

## 2024-03-30 NOTE — ED Provider Notes (Signed)
 [facility] Provider Note    Event Date/Time   First MD Initiated Contact with Patient 03/30/24 [phone]     (approximate)   History   Arm Injury   HPI  [name] is a 58 y.o. female right-hand-dominant presents today for evaluation of left arm pain after a fall.  She reports that she tripped and fell and landed on her left upper arm.  There was no head strike or LOC.  She reports that she has had pain in her left upper arm ever since and is unable to move her arm.  There was no head strike or LOC.  She denies any other injury sustained.  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   Thrombocytopenia 10/05/2021   AKI (acute kidney injury) 10/05/2021   Elevated troponin 10/05/2021   Acidosis 10/05/2021   Cirrhosis (HCC) 10/05/2021   Septic shock (HCC) 10/05/2021   Severe sepsis (HCC)    Urinary tract infection without hematuria    Depression 09/21/2014   Cough 09/19/2014   Dyspnea 05/10/2014   Angina decubitus 03/28/2014   Memory loss 03/28/2014   Bleeding gums 03/28/2014   Sinus bradycardia    Ischemic cardiomyopathy    CAD (coronary artery disease)    Hypokalemia 03/05/2014   Essential hypertension    ST elevation myocardial infarction (STEMI) involving right coronary artery with complication (HCC) 03/03/2014   Left rotator cuff tear 07/27/2013   Complete rotator cuff tear of left shoulder 07/27/2013   Otalgia 05/25/2013   Rotator cuff tear, non-traumatic 03/02/2013   Weakness generalized 09/06/2012   Fall 09/06/2012    Adjustment disorder with depressed mood 09/06/2012   Dizziness and giddiness 07/25/2012   Fibromyalgia 05/12/2012   Asplenia 05/12/2012   Diarrhea 01/07/2012   Nonalcoholic fatty liver disease 01/07/2012   GERD (gastroesophageal reflux disease) 09/15/2011   Hyperlipidemia    Lateral epicondylitis  of elbow 12/17/2008          Physical Exam   Triage Vital Signs: ED Triage Vitals  Encounter Vitals Group     BP 03/30/24 0741 136/87     Girls Systolic BP Percentile --      Girls Diastolic BP Percentile --      Boys Systolic BP Percentile --      Boys Diastolic BP Percentile --      Pulse Rate 03/30/24 0741 68     Resp 03/30/24 0741 18     Temp 03/30/24 0741 (!) 97.5 F (36.4 C)     Temp Source 03/30/24 0741 Oral     SpO2 03/30/24 0741 100 %     Weight 03/30/24 0745 148 lb (67.1 kg)     Height 03/30/24 0745 5' 3 (1.6 m)     Head Circumference --      Peak Flow --      Pain Score 03/30/24 0741 10     Pain Loc --      Pain Education --      Exclude from Growth Chart --     Most recent vital signs: Vitals:  03/30/24 0741  BP: 136/87  Pulse: 68  Resp: 18  Temp: (!) 97.5 F (36.4 C)  SpO2: 100%    Physical Exam Vitals and nursing note reviewed.  Constitutional:      General: Awake and alert. No acute distress.    Appearance: Normal appearance. The patient is normal weight.  HENT:     Head: Normocephalic and atraumatic.     Mouth: Mucous membranes are moist.  Eyes:     General: PERRL. Normal EOMs        Right eye: No discharge.        Left eye: No discharge.     Conjunctiva/sclera: Conjunctivae normal.  Cardiovascular:     Rate and Rhythm: Normal rate and regular rhythm.     Pulses: Normal pulses.  Pulmonary:     Effort: Pulmonary effort is normal. No respiratory distress.     Breath sounds: Normal breath sounds.  Abdominal:     Abdomen is soft. There is no abdominal tenderness. No rebound or guarding. No distention. Musculoskeletal:        General: No  swelling. Normal range of motion.     Cervical back: Normal range of motion and neck supple. Left arm: No obvious deformity.  Cradling arm with her opposite hand.  No tenderness at the wrist, forearm, or elbow.  Proximal humerus tenderness present.  Sensation intact over deltoid.  No open wounds.  Normal radial pulse.  Normal grip strength.  Compartment soft and compressible throughout. Skin:    General: Skin is warm and dry.     Capillary Refill: Capillary refill takes less than 2 seconds.     Findings: No rash.  Neurological:     Mental Status: The patient is awake and alert.      ED Results / Procedures / Treatments   Labs (all labs ordered are listed, but only abnormal results are displayed) Labs Reviewed - No data to display   EKG     RADIOLOGY I independently reviewed and interpreted imaging and agree with radiologists findings.     PROCEDURES:  Critical Care performed:   Procedures   MEDICATIONS ORDERED IN ED: Medications  traMADol  (ULTRAM ) tablet 50 mg (50 mg Oral Given 03/30/24 9161)     IMPRESSION / MDM / ASSESSMENT AND PLAN / ED COURSE  I reviewed the triage vital signs and the nursing notes.   Differential diagnosis includes, but is not limited to, proximal humerus fracture, shoulder dislocation, contusion  Patient is awake and alert, hemodynamically stable and afebrile.  She has normal radial pulse, sensation intact throughout arm.  No head strike or LOC, no headache or neck pain.  X-ray obtained does reveal a proximal humerus fracture.  I consulted orthopedics and discussed with Dr. Edie who agrees with plan for shoulder immobilizer/sling and outpatient follow-up.  Patient has multiple allergies to pain medicines, though reports that she tolerates tramadol .  She was given a small amount of tramadol  to use at home.  We discussed that this medication is highly addictive and should only be used for breakthrough pain.  Also discussed that she cannot  drive, operate a machinery, perform any test that require concentration while taking this medication, nor should she drive with her humerus fracture given that she cannot properly use her left arm to hold the steering wheel.  Patient understands and agrees with plan.  She was discharged in stable condition.  Patient's presentation is most consistent with acute complicated illness / injury requiring diagnostic workup.  Clinical Course as of 03/30/24 1150  Thu Mar 30, 2024  9165 Patient has multiple allergies, reports that she has tolerated tramadol . [JP]  [phone] Secure chat sent to orthopedics [JP]    Clinical Course User Index [JP] Zenas Santa E, PA-C     FINAL CLINICAL IMPRESSION(S) / ED DIAGNOSES   Final diagnoses:  Other closed displaced fracture of proximal end of left humerus, initial encounter     Rx / DC Orders   ED Discharge Orders          Ordered    traMADol  (ULTRAM ) 50 MG tablet  Every 6 hours PRN        03/30/24 0854             Note:  This document was prepared using Dragon voice recognition software and may include unintentional dictation errors.   Cesia Orf E, PA-C 03/30/24 1150    Waymond Lorelle Cummins, MD 03/30/24 1314

## 2024-03-30 NOTE — Discharge Instructions (Signed)
 Please follow-up with orthopedics.  Please keep your sling on at all times.  You may take the tramadol  to help with your symptoms, but remember that this medication is highly addictive and should only be used for breakthrough pain when Tylenol  does not work first.  [name] may take Tylenol  500 mg 3 times per day.  Please also ice the area as we discussed.  Remember that you cannot drive, operate heavy machinery, or perform any tasks that require concentration while taking the tramadol .  You should also not drive with your humerus fracture given that you cannot properly hold the steering wheel.  Please return for any new, worsening, or changing symptoms or other concerns.  Was a pleasure caring for you today.

## 2024-04-14 ENCOUNTER — Emergency Department

## 2024-04-14 ENCOUNTER — Inpatient Hospital Stay
Admission: EM | Admit: 2024-04-14 | Discharge: 2024-04-21 | DRG: 637 | Disposition: A | Discharge status: Skilled Nursing Facility | Attending: Internal Medicine | Admitting: Internal Medicine

## 2024-04-14 DIAGNOSIS — R296 Repeated falls: Secondary | ICD-10-CM | POA: Diagnosis present

## 2024-04-14 DIAGNOSIS — S42202A Unspecified fracture of upper end of left humerus, initial encounter for closed fracture: Secondary | ICD-10-CM | POA: Diagnosis present

## 2024-04-14 DIAGNOSIS — Z794 Long term (current) use of insulin: Secondary | ICD-10-CM

## 2024-04-14 DIAGNOSIS — R4182 Altered mental status, unspecified: Principal | ICD-10-CM

## 2024-04-14 DIAGNOSIS — F17210 Nicotine dependence, cigarettes, uncomplicated: Secondary | ICD-10-CM | POA: Diagnosis present

## 2024-04-14 DIAGNOSIS — Z955 Presence of coronary angioplasty implant and graft: Secondary | ICD-10-CM

## 2024-04-14 DIAGNOSIS — E870 Hyperosmolality and hypernatremia: Secondary | ICD-10-CM | POA: Diagnosis present

## 2024-04-14 DIAGNOSIS — F32A Depression, unspecified: Secondary | ICD-10-CM | POA: Diagnosis present

## 2024-04-14 DIAGNOSIS — E1110 Type 2 diabetes mellitus with ketoacidosis without coma: Principal | ICD-10-CM | POA: Diagnosis present

## 2024-04-14 DIAGNOSIS — Z635 Disruption of family by separation and divorce: Secondary | ICD-10-CM

## 2024-04-14 DIAGNOSIS — L89152 Pressure ulcer of sacral region, stage 2: Secondary | ICD-10-CM | POA: Diagnosis present

## 2024-04-14 DIAGNOSIS — E860 Dehydration: Secondary | ICD-10-CM | POA: Diagnosis present

## 2024-04-14 DIAGNOSIS — B961 Klebsiella pneumoniae [K. pneumoniae] as the cause of diseases classified elsewhere: Secondary | ICD-10-CM | POA: Diagnosis present

## 2024-04-14 DIAGNOSIS — E1142 Type 2 diabetes mellitus with diabetic polyneuropathy: Secondary | ICD-10-CM | POA: Diagnosis present

## 2024-04-14 DIAGNOSIS — G8929 Other chronic pain: Secondary | ICD-10-CM | POA: Diagnosis present

## 2024-04-14 DIAGNOSIS — Z1629 Resistance to other single specified antibiotic: Secondary | ICD-10-CM | POA: Diagnosis present

## 2024-04-14 DIAGNOSIS — I252 Old myocardial infarction: Secondary | ICD-10-CM

## 2024-04-14 DIAGNOSIS — I255 Ischemic cardiomyopathy: Secondary | ICD-10-CM | POA: Diagnosis present

## 2024-04-14 DIAGNOSIS — G9341 Metabolic encephalopathy: Secondary | ICD-10-CM | POA: Diagnosis present

## 2024-04-14 DIAGNOSIS — K219 Gastro-esophageal reflux disease without esophagitis: Secondary | ICD-10-CM | POA: Diagnosis present

## 2024-04-14 DIAGNOSIS — Z8619 Personal history of other infectious and parasitic diseases: Secondary | ICD-10-CM

## 2024-04-14 DIAGNOSIS — M797 Fibromyalgia: Secondary | ICD-10-CM | POA: Diagnosis present

## 2024-04-14 DIAGNOSIS — I2510 Atherosclerotic heart disease of native coronary artery without angina pectoris: Secondary | ICD-10-CM | POA: Diagnosis present

## 2024-04-14 DIAGNOSIS — Y92009 Unspecified place in unspecified non-institutional (private) residence as the place of occurrence of the external cause: Secondary | ICD-10-CM

## 2024-04-14 DIAGNOSIS — E1165 Type 2 diabetes mellitus with hyperglycemia: Secondary | ICD-10-CM

## 2024-04-14 DIAGNOSIS — E785 Hyperlipidemia, unspecified: Secondary | ICD-10-CM | POA: Diagnosis present

## 2024-04-14 DIAGNOSIS — K7581 Nonalcoholic steatohepatitis (NASH): Secondary | ICD-10-CM | POA: Diagnosis present

## 2024-04-14 DIAGNOSIS — Z7984 Long term (current) use of oral hypoglycemic drugs: Secondary | ICD-10-CM

## 2024-04-14 DIAGNOSIS — N179 Acute kidney failure, unspecified: Secondary | ICD-10-CM | POA: Diagnosis present

## 2024-04-14 DIAGNOSIS — D75839 Thrombocytosis, unspecified: Secondary | ICD-10-CM | POA: Diagnosis present

## 2024-04-14 DIAGNOSIS — I10 Essential (primary) hypertension: Secondary | ICD-10-CM | POA: Diagnosis present

## 2024-04-14 DIAGNOSIS — Z9081 Acquired absence of spleen: Secondary | ICD-10-CM

## 2024-04-14 DIAGNOSIS — Z1152 Encounter for screening for COVID-19: Secondary | ICD-10-CM | POA: Diagnosis not present

## 2024-04-14 DIAGNOSIS — D649 Anemia, unspecified: Secondary | ICD-10-CM | POA: Diagnosis present

## 2024-04-14 DIAGNOSIS — Z833 Family history of diabetes mellitus: Secondary | ICD-10-CM

## 2024-04-14 DIAGNOSIS — F419 Anxiety disorder, unspecified: Secondary | ICD-10-CM | POA: Diagnosis present

## 2024-04-14 DIAGNOSIS — E876 Hypokalemia: Secondary | ICD-10-CM | POA: Diagnosis present

## 2024-04-14 DIAGNOSIS — W19XXXA Unspecified fall, initial encounter: Secondary | ICD-10-CM | POA: Diagnosis present

## 2024-04-14 DIAGNOSIS — N390 Urinary tract infection, site not specified: Secondary | ICD-10-CM | POA: Diagnosis present

## 2024-04-14 DIAGNOSIS — Z79899 Other long term (current) drug therapy: Secondary | ICD-10-CM

## 2024-04-14 DIAGNOSIS — I951 Orthostatic hypotension: Secondary | ICD-10-CM | POA: Diagnosis present

## 2024-04-14 DIAGNOSIS — E1310 Other specified diabetes mellitus with ketoacidosis without coma: Secondary | ICD-10-CM

## 2024-04-14 DIAGNOSIS — Z791 Long term (current) use of non-steroidal anti-inflammatories (NSAID): Secondary | ICD-10-CM

## 2024-04-14 DIAGNOSIS — Z90710 Acquired absence of both cervix and uterus: Secondary | ICD-10-CM

## 2024-04-14 DIAGNOSIS — Z885 Allergy status to narcotic agent status: Secondary | ICD-10-CM

## 2024-04-14 LAB — BASIC METABOLIC PANEL WITH GFR
Anion gap: 28 — ABNORMAL HIGH (ref 5–15)
Anion gap: 15 (ref 5–15)
Anion gap: 14 (ref 5–15)
BUN: 28 mg/dL — ABNORMAL HIGH (ref 6–20)
BUN: 23 mg/dL — ABNORMAL HIGH (ref 6–20)
BUN: 21 mg/dL — ABNORMAL HIGH (ref 6–20)
CO2: 11 mmol/L — ABNORMAL LOW (ref 22–32)
CO2: 21 mmol/L — ABNORMAL LOW (ref 22–32)
CO2: 24 mmol/L (ref 22–32)
Calcium: 10.2 mg/dL (ref 8.9–10.3)
Calcium: 9.9 mg/dL (ref 8.9–10.3)
Calcium: 10 mg/dL (ref 8.9–10.3)
Chloride: 103 mmol/L (ref 98–111)
Chloride: 111 mmol/L (ref 98–111)
Chloride: 113 mmol/L — ABNORMAL HIGH (ref 98–111)
Creatinine, Ser: 1.54 mg/dL — ABNORMAL HIGH (ref 0.44–1.00)
Creatinine, Ser: 1.26 mg/dL — ABNORMAL HIGH (ref 0.44–1.00)
Creatinine, Ser: 1.14 mg/dL — ABNORMAL HIGH (ref 0.44–1.00)
GFR, Estimated: 38 mL/min — ABNORMAL LOW
GFR, Estimated: 49 mL/min — ABNORMAL LOW
GFR, Estimated: 55 mL/min — ABNORMAL LOW
Glucose, Bld: 854 mg/dL (ref 70–99)
Glucose, Bld: 467 mg/dL — ABNORMAL HIGH (ref 70–99)
Glucose, Bld: 283 mg/dL — ABNORMAL HIGH (ref 70–99)
Potassium: 4.2 mmol/L (ref 3.5–5.1)
Potassium: 3.2 mmol/L — ABNORMAL LOW (ref 3.5–5.1)
Potassium: 3 mmol/L — ABNORMAL LOW (ref 3.5–5.1)
Sodium: 141 mmol/L (ref 135–145)
Sodium: 147 mmol/L — ABNORMAL HIGH (ref 135–145)
Sodium: 151 mmol/L — ABNORMAL HIGH (ref 135–145)

## 2024-04-14 LAB — COMPREHENSIVE METABOLIC PANEL WITH GFR
ALT: 12 U/L (ref 0–44)
AST: <10 U/L — ABNORMAL LOW (ref 15–41)
Albumin: 3.6 g/dL (ref 3.5–5.0)
Alkaline Phosphatase: 208 U/L — ABNORMAL HIGH (ref 38–126)
Anion gap: 30 — ABNORMAL HIGH (ref 5–15)
BUN: 27 mg/dL — ABNORMAL HIGH (ref 6–20)
CO2: 10 mmol/L — ABNORMAL LOW (ref 22–32)
Calcium: 10.4 mg/dL — ABNORMAL HIGH (ref 8.9–10.3)
Chloride: 100 mmol/L (ref 98–111)
Creatinine, Ser: 1.6 mg/dL — ABNORMAL HIGH (ref 0.44–1.00)
GFR, Estimated: 37 mL/min — ABNORMAL LOW
Glucose, Bld: 957 mg/dL (ref 70–99)
Potassium: 4.5 mmol/L (ref 3.5–5.1)
Sodium: 140 mmol/L (ref 135–145)
Total Bilirubin: 0.3 mg/dL (ref 0.0–1.2)
Total Protein: 7 g/dL (ref 6.5–8.1)

## 2024-04-14 LAB — CBG MONITORING, ED
Glucose-Capillary: >600 mg/dL (ref 70–99)
Glucose-Capillary: >600 mg/dL (ref 70–99)
Glucose-Capillary: >600 mg/dL (ref 70–99)
Glucose-Capillary: >600 mg/dL (ref 70–99)
Glucose-Capillary: >600 mg/dL (ref 70–99)
Glucose-Capillary: >600 mg/dL (ref 70–99)
Glucose-Capillary: 518 mg/dL (ref 70–99)
Glucose-Capillary: 543 mg/dL (ref 70–99)

## 2024-04-14 LAB — SALICYLATE LEVEL: Salicylate Lvl: <7 mg/dL — ABNORMAL LOW (ref 7.0–30.0)

## 2024-04-14 LAB — BETA-HYDROXYBUTYRIC ACID
Beta-Hydroxybutyric Acid: >8 mmol/L — ABNORMAL HIGH (ref 0.05–0.27)
Beta-Hydroxybutyric Acid: >4.5 mmol/L — ABNORMAL HIGH (ref 0.05–0.27)
Beta-Hydroxybutyric Acid: 2.18 mmol/L — ABNORMAL HIGH (ref 0.05–0.27)
Beta-Hydroxybutyric Acid: 1.12 mmol/L — ABNORMAL HIGH (ref 0.05–0.27)

## 2024-04-14 LAB — CBC WITH DIFFERENTIAL/PLATELET
Abs Immature Granulocytes: 0.16 K/uL — ABNORMAL HIGH (ref 0.00–0.07)
Basophils Absolute: 0 K/uL (ref 0.0–0.1)
Basophils Relative: 0 %
Eosinophils Absolute: 0 K/uL (ref 0.0–0.5)
Eosinophils Relative: 0 %
HCT: 34.6 % — ABNORMAL LOW (ref 36.0–46.0)
Hemoglobin: 11.1 g/dL — ABNORMAL LOW (ref 12.0–15.0)
Immature Granulocytes: 1 %
Lymphocytes Relative: 6 %
Lymphs Abs: 1.1 K/uL (ref 0.7–4.0)
MCH: 31.7 pg (ref 26.0–34.0)
MCHC: 32.1 g/dL (ref 30.0–36.0)
MCV: 98.9 fL (ref 80.0–100.0)
Monocytes Absolute: 0.8 K/uL (ref 0.1–1.0)
Monocytes Relative: 5 %
Neutro Abs: 14.7 K/uL — ABNORMAL HIGH (ref 1.7–7.7)
Neutrophils Relative %: 88 %
Platelets: 806 K/uL — ABNORMAL HIGH (ref 150–400)
RBC: 3.5 MIL/uL — ABNORMAL LOW (ref 3.87–5.11)
RDW: 16 % — ABNORMAL HIGH (ref 11.5–15.5)
WBC: 16.7 K/uL — ABNORMAL HIGH (ref 4.0–10.5)
nRBC: 0 % (ref 0.0–0.2)

## 2024-04-14 LAB — MAGNESIUM
Magnesium: 2.7 mg/dL — ABNORMAL HIGH (ref 1.7–2.4)
Magnesium: 2.3 mg/dL (ref 1.7–2.4)

## 2024-04-14 LAB — URINALYSIS, ROUTINE W REFLEX MICROSCOPIC
Bilirubin Urine: NEGATIVE
Glucose, UA: >=500 mg/dL — AB
Hgb urine dipstick: NEGATIVE
Ketones, ur: 20 mg/dL — AB
Leukocytes,Ua: NEGATIVE
Nitrite: POSITIVE — AB
Protein, ur: NEGATIVE mg/dL
RBC / HPF: 0 RBC/hpf (ref 0–5)
Specific Gravity, Urine: 1.027 (ref 1.005–1.030)
pH: 5 (ref 5.0–8.0)

## 2024-04-14 LAB — TSH: TSH: <0.1 u[IU]/mL — ABNORMAL LOW (ref 0.350–4.500)

## 2024-04-14 LAB — GLUCOSE, CAPILLARY
Glucose-Capillary: 203 mg/dL — ABNORMAL HIGH (ref 70–99)
Glucose-Capillary: 283 mg/dL — ABNORMAL HIGH (ref 70–99)
Glucose-Capillary: 336 mg/dL — ABNORMAL HIGH (ref 70–99)
Glucose-Capillary: 390 mg/dL — ABNORMAL HIGH (ref 70–99)
Glucose-Capillary: 403 mg/dL — ABNORMAL HIGH (ref 70–99)
Glucose-Capillary: 487 mg/dL — ABNORMAL HIGH (ref 70–99)
Glucose-Capillary: 503 mg/dL (ref 70–99)

## 2024-04-14 LAB — BLOOD GAS, VENOUS
Acid-base deficit: 17 mmol/L — ABNORMAL HIGH (ref 0.0–2.0)
Bicarbonate: 10.9 mmol/L — ABNORMAL LOW (ref 20.0–28.0)
O2 Saturation: 72.3 %
Patient temperature: 37
pCO2, Ven: 32 mmHg — ABNORMAL LOW (ref 44–60)
pH, Ven: 7.14 — CL (ref 7.25–7.43)
pO2, Ven: 43 mmHg (ref 32–45)

## 2024-04-14 LAB — HIV ANTIBODY (ROUTINE TESTING W REFLEX): HIV Screen 4th Generation wRfx: NONREACTIVE

## 2024-04-14 LAB — ETHANOL: Alcohol, Ethyl (B): <15 mg/dL

## 2024-04-14 LAB — AMMONIA: Ammonia: <13 umol/L (ref 9–35)

## 2024-04-14 LAB — RESP PANEL BY RT-PCR (RSV, FLU A&B, COVID)  RVPGX2
Influenza A by PCR: NEGATIVE
Influenza B by PCR: NEGATIVE
Resp Syncytial Virus by PCR: NEGATIVE
SARS Coronavirus 2 by RT PCR: NEGATIVE

## 2024-04-14 LAB — TROPONIN T, HIGH SENSITIVITY
Troponin T High Sensitivity: 24 ng/L — ABNORMAL HIGH (ref 0–19)
Troponin T High Sensitivity: 24 ng/L — ABNORMAL HIGH (ref 0–19)

## 2024-04-14 LAB — MRSA NEXT GEN BY PCR, NASAL: MRSA by PCR Next Gen: NOT DETECTED

## 2024-04-14 LAB — OSMOLALITY: Osmolality: 373 mosm/kg (ref 275–295)

## 2024-04-14 LAB — URINE DRUG SCREEN
Amphetamines: NEGATIVE
Barbiturates: NEGATIVE
Benzodiazepines: NEGATIVE
Cocaine: NEGATIVE
Fentanyl: NEGATIVE
Methadone Scn, Ur: NEGATIVE
Opiates: NEGATIVE
Tetrahydrocannabinol: NEGATIVE

## 2024-04-14 LAB — T4, FREE: Free T4: 1.02 ng/dL (ref 0.80–2.00)

## 2024-04-14 LAB — ACETAMINOPHEN LEVEL: Acetaminophen (Tylenol), Serum: <10 ug/mL — ABNORMAL LOW (ref 10–30)

## 2024-04-14 LAB — CK: Total CK: 69 U/L (ref 38–234)

## 2024-04-14 LAB — PHOSPHORUS: Phosphorus: <1 mg/dL — CL (ref 2.5–4.6)

## 2024-04-14 MED ORDER — HEPARIN SODIUM (PORCINE) 5000 UNIT/ML IJ SOLN
5000.0000 [IU] | Freq: Three times a day (TID) | INTRAMUSCULAR | Status: DC
  Administered 2024-04-14 – 2024-04-17 (×10): 5000 [IU] via SUBCUTANEOUS
  Filled 2024-04-14 (×10): qty 1

## 2024-04-14 MED ORDER — CHLORHEXIDINE GLUCONATE CLOTH 2 % EX PADS
6.0000 | MEDICATED_PAD | Freq: Every day | CUTANEOUS | Status: DC
  Administered 2024-04-15 – 2024-04-21 (×4): 6 via TOPICAL

## 2024-04-14 MED ORDER — LACTATED RINGERS IV BOLUS: 1000.0000 mL | Freq: Once | INTRAVENOUS | Status: DC

## 2024-04-14 MED ORDER — SODIUM BICARBONATE 8.4 % IV SOLN
50.0000 meq | Freq: Once | INTRAVENOUS | Status: AC
  Administered 2024-04-14: 50 meq via INTRAVENOUS
  Filled 2024-04-14: qty 50

## 2024-04-14 MED ORDER — SENNOSIDES-DOCUSATE SODIUM 8.6-50 MG PO TABS: 1.0000 | ORAL_TABLET | Freq: Every evening | ORAL | Status: DC | PRN

## 2024-04-14 MED ORDER — ACETAMINOPHEN 325 MG PO TABS: 650.0000 mg | ORAL_TABLET | Freq: Four times a day (QID) | ORAL | Status: DC | PRN

## 2024-04-14 MED ORDER — ONDANSETRON HCL 4 MG/2ML IJ SOLN: 4.0000 mg | Freq: Four times a day (QID) | INTRAMUSCULAR | Status: DC | PRN

## 2024-04-14 MED ORDER — DEXTROSE IN LACTATED RINGERS 5 % IV SOLN: INTRAVENOUS | Status: DC

## 2024-04-14 MED ORDER — ONDANSETRON HCL 4 MG PO TABS: 4.0000 mg | ORAL_TABLET | Freq: Four times a day (QID) | ORAL | Status: DC | PRN

## 2024-04-14 MED ORDER — SODIUM CHLORIDE 0.9% FLUSH
3.0000 mL | Freq: Two times a day (BID) | INTRAVENOUS | Status: DC
  Administered 2024-04-14 – 2024-04-21 (×15): 3 mL via INTRAVENOUS

## 2024-04-14 MED ORDER — DEXTROSE-SODIUM CHLORIDE 5-0.45 % IV SOLN: INTRAVENOUS | Status: DC

## 2024-04-14 MED ORDER — SODIUM PHOSPHATES 45 MMOLE/15ML IV SOLN
45.0000 mmol | Freq: Once | INTRAVENOUS | Status: DC
  Filled 2024-04-14: qty 15

## 2024-04-14 MED ORDER — DEXTROSE 50 % IV SOLN: 0.0000 mL | INTRAVENOUS | Status: DC | PRN

## 2024-04-14 MED ORDER — POTASSIUM PHOSPHATES 15 MMOLE/5ML IV SOLN
45.0000 mmol | Freq: Once | INTRAVENOUS | Status: AC
  Administered 2024-04-15: 45 mmol via INTRAVENOUS
  Filled 2024-04-14: qty 15

## 2024-04-14 MED ORDER — POTASSIUM CHLORIDE 10 MEQ/100ML IV SOLN
10.0000 meq | INTRAVENOUS | Status: AC
  Administered 2024-04-14 – 2024-04-15 (×6): 10 meq via INTRAVENOUS
  Filled 2024-04-14 (×6): qty 100

## 2024-04-14 MED ORDER — INSULIN REGULAR(HUMAN) IN NACL 100-0.9 UT/100ML-% IV SOLN
INTRAVENOUS | Status: DC
  Administered 2024-04-14: 3.6 [IU]/h via INTRAVENOUS
  Administered 2024-04-14: 10.5 [IU]/h via INTRAVENOUS
  Filled 2024-04-14 (×3): qty 100

## 2024-04-14 MED ORDER — ACETAMINOPHEN 650 MG RE SUPP: 650.0000 mg | Freq: Four times a day (QID) | RECTAL | Status: DC | PRN

## 2024-04-14 MED ORDER — SODIUM CHLORIDE 0.45 % IV BOLUS
500.0000 mL | Freq: Once | INTRAVENOUS | Status: AC
  Administered 2024-04-15: 500 mL via INTRAVENOUS

## 2024-04-14 MED ORDER — POTASSIUM CHLORIDE 10 MEQ/100ML IV SOLN
10.0000 meq | INTRAVENOUS | Status: AC
  Administered 2024-04-14 (×2): 10 meq via INTRAVENOUS
  Filled 2024-04-14 (×2): qty 100

## 2024-04-14 MED ORDER — STERILE WATER FOR INJECTION IV SOLN
INTRAVENOUS | Status: DC
  Filled 2024-04-14: qty 1000
  Filled 2024-04-14: qty 150

## 2024-04-14 MED ORDER — LACTATED RINGERS IV BOLUS
1000.0000 mL | Freq: Once | INTRAVENOUS | Status: AC
  Administered 2024-04-14: 1000 mL via INTRAVENOUS

## 2024-04-14 MED ORDER — LACTATED RINGERS IV SOLN: INTRAVENOUS | Status: DC

## 2024-04-14 NOTE — H&P (Addendum)
 " History and Physical    [name] FMW:[id number] DOB: 1966-01-20 DOA: 04/14/2024  DOS: the patient was seen and examined on 04/14/2024  PCP: [facility] Personal   Patient coming from: Home  I have personally briefly reviewed patient's old medical records in [facility] and CareEverywhere  HPI:   [name] is a 58 y.o. year old female with medical history of hypertension, hyperlipidemia, type 2 diabetes presenting to the ED with confusion and somnolence.  Patient was brought here by her mother who states patient has not had significant p.o. intake in the last 2 days.  Patient is somnolent on exam and unable to contribute to history. On arrival to the ED patient was noted to be HDS stable.  Lab work and imaging obtained.  CBC with leukocytosis at 16.7, mild anemia at 11.1 significant thrombocytosis at 806.  CMP with severe hyperglycemia at 957 with significant anion gap metabolic acidosis with bicarb at 10 and AKI and hypercalcium which are likely mediated by dehydration.  Magnesium  was checked and 2.7.  Serum osmolality elevated at 373, BHB greater than 8.  VBG shows severe acidemia at 7.1 4/32/43.  TSH checked and less than 0.100 likely in setting of acute illness.  UA shows nitrite positive and bacteria.  Urine culture pending.  Ammonia level normal.  Patient's position consistent with DKA so DKA protocol initiated.  Given presentation, [facility] contacted for admission.  Review of Systems: unable to review all systems due to the inability of the patient to answer questions.   Past Medical History:  Diagnosis Date   Anxiety    CAD (coronary artery disease)    a.  Inferior STEMI / CAD s/p DES to prox RCA 03/03/14, staged cath 03/05/14 for LAD showing significantly improved stenosis of mid LAD with resolution of prior thrombus.   Clostridium difficile colitis    Diabetes mellitus    Dyslipidemia    Fibromyalgia    GERD (gastroesophageal reflux disease)     H/O hiatal hernia    Ischemic cardiomyopathy    a. EF 45-50% by echo 03/04/14 with suspected mild acute diastolic CHF s/p dose of IV Lasix .   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   Sinus bradycardia    a. During 02/2014 admit, not on BB due to this.   Upper respiratory infection 2/15   states has dry non productive cough since then- tickle in my throat no fever    Past Surgical History:  Procedure Laterality Date   ABDOMINAL HYSTERECTOMY     partial   CARDIAC CATHETERIZATION  03/05/14   improved LAD   COLONOSCOPY  12/07/2011   Rourk-normal rectum,, colon and terminal ileum   CORONARY ANGIOPLASTY WITH STENT PLACEMENT  03/03/14   DES to RCA,  ulcerated plaque in LAD   ESOPHAGOGASTRODUODENOSCOPY  08/27/2011   Rourk-reactive gastropathy, normal, patent esophagus. Stomach empty. Multiple linear antral erosions. No ulcer or infiltrating process. Small hiatal hernia. Patent pylorus. Normal first and second portion of the duodenum   IR RADIOLOGIST EVAL & MGMT  11/11/2021   LEFT HEART CATH Bilateral 03/03/2014   Procedure: LEFT HEART CATH;  Surgeon: Peter M Jordan, MD;  Location: [facility] CATH LAB;  Service: Cardiovascular;  Laterality: Bilateral;   PERCUTANEOUS CORONARY STENT INTERVENTION (PCI-S) N/A 03/05/2014   Procedure: PERCUTANEOUS CORONARY STENT INTERVENTION (PCI-S);  Surgeon: Debby  DELENA Sor, MD;  Location: [facility] CATH LAB;  Service: Cardiovascular;  Laterality: N/A;   SHOULDER ARTHROSCOPY WITH OPEN ROTATOR CUFF REPAIR Left 03/02/2013   Procedure: LEFT SHOULDER ARTHROSCOPY, SUBACROMIAL DECOMPRESSION, MANIPULATION UNDER ANESTHESIA;  Surgeon: Reyes JAYSON Billing, MD;  Location: [facility] ORS;  Service: Orthopedics;  Laterality: Left;   SHOULDER OPEN ROTATOR CUFF REPAIR Left 07/27/2013   Procedure: LEFT MINI OPEN ROTATOR CUFF REPAIR ;  Surgeon: Reyes JAYSON Billing, MD;  Location: [facility] ORS;   Service: Orthopedics;  Laterality: Left;   SPLENECTOMY     TONSILLECTOMY       Allergies[1]  Family History  Problem Relation Age of Onset   Arthritis Mother    Diabetes Father    Kidney disease Father    Colon cancer Neg Hx     Prior to Admission medications  Medication Sig Start Date End Date Taking? Authorizing Provider  acetaminophen  (TYLENOL ) 325 MG tablet Take 2 tablets (650 mg total) by mouth every 6 (six) hours as needed for mild pain (or Fever >/= 101). 10/28/21   Earley Saucer, MD  albuterol  (VENTOLIN  HFA) 108 (90 Base) MCG/ACT inhaler Inhale 2 puffs into the lungs every 4 (four) hours as needed for shortness of breath or wheezing. Patient not taking: Reported on 11/28/2021 05/27/21   [provider]  cefadroxil  (DURICEF) 1 g tablet Take 1 tablet (1 g total) by mouth 2 (two) times daily. Patient not taking: Reported on 11/28/2021 11/14/21   Dennise Kingsley, MD  Cholecalciferol (VITAMIN D3) 1.25 MG (50000 UT) CAPS Take 50,000 Units by mouth every Sunday. 10/02/21   [provider]  DULoxetine  (CYMBALTA ) 60 MG capsule Take 60 mg by mouth at bedtime. 09/16/21   [provider]  gabapentin  (NEURONTIN ) 300 MG capsule Take 1 capsule (300 mg total) by mouth 3 (three) times daily. 10/28/21 11/27/21  Rizwan, Saima, MD  insulin  aspart (NOVOLOG  FLEXPEN) 100 UNIT/ML FlexPen Inject 1-15 Units into the skin 3 (three) times daily with meals. CBG 121 - 150: 2 units  CBG 151 - 200: 3 units  CBG 201 - 250: 5 units  CBG 251 - 300: 8 units  CBG 301 - 350: 11 units  CBG 351 - 400: 15 units 10/28/21   Rizwan, Saima, MD  Insulin  Glargine (BASAGLAR  KWIKPEN) 100 UNIT/ML Inject 35 Units into the skin at bedtime. Patient taking differently: Inject 35 Units into the skin at bedtime. SLIDING SCALE 10/28/21   Rizwan, Saima, MD  lisinopril  (ZESTRIL ) 10 MG tablet Take 10 mg by mouth daily. 09/16/21   [provider]  metFORMIN  (GLUCOPHAGE ) 1000 MG tablet Take 1,000 mg by mouth 2  (two) times daily. 09/07/21   [provider]  pantoprazole  (PROTONIX ) 40 MG tablet Take 1 tablet (40 mg total) by mouth daily. 10/29/21 11/28/21  Rizwan, Saima, MD  polyethylene glycol powder (GLYCOLAX /MIRALAX ) 17 GM/SCOOP powder Take 17 g by mouth daily as needed for moderate constipation. Patient not taking: Reported on 11/28/2021 10/28/21   Rizwan, Saima, MD  Probiotic Product (PROBIOTIC PO) Take 1 capsule by mouth daily.    [provider]  simvastatin  (ZOCOR ) 40 MG tablet Take 40 mg by mouth at bedtime. 09/14/21   [provider]  traMADol  (ULTRAM ) 50 MG tablet Take 1 tablet (50 mg total) by mouth every 6 (six) hours as needed. 03/30/24 03/30/25  Poggi, Jenna E, PA-C    Social History:  reports that she quit smoking about 17 years ago. Her smoking use included cigarettes. She started smoking about 48  years ago. She has a 7.5 pack-year smoking history. She has never used smokeless tobacco. She reports that she does not currently use alcohol. She reports that she does not use drugs. Lives with mother, smokes cigarette but unknown quantity, occasional use.    Physical Exam: Vitals:   04/14/24 1310  BP: 122/70  Pulse: 93  Resp: 18  Temp: 98.1 F (36.7 C)  TempSrc: Oral  SpO2: 100%    Gen: NAD HENT: NCAT CV: normal heart sounds Lung: CTAB Abd: No TTP, normal bowel sounds MSK: No asymmetry, good bulk and tone Neuro: Somnolent but arouses to voice.   Labs on Admission: I have personally reviewed following labs and imaging studies  CBC: Recent Labs  Lab 04/14/24 1311  WBC 16.7*  NEUTROABS 14.7*  HGB 11.1*  HCT 34.6*  MCV 98.9  PLT 806*   Basic Metabolic Panel: Recent Labs  Lab 04/14/24 1311  NA 140  K 4.5  CL 100  CO2 10*  GLUCOSE 957*  BUN 27*  CREATININE 1.60*  CALCIUM  10.4*  MG 2.7*   GFR: CrCl cannot be calculated (Unknown ideal weight.). Liver Function Tests: Recent Labs  Lab 04/14/24 1311  AST <10*  ALT 12  ALKPHOS 208*   BILITOT 0.3  PROT 7.0  ALBUMIN  3.6   No results for input(s): LIPASE, AMYLASE in the last 168 hours. Recent Labs  Lab 04/14/24 1311  AMMONIA <13   Coagulation Profile: No results for input(s): INR, PROTIME in the last 168 hours. Cardiac Enzymes: Recent Labs  Lab 04/14/24 1311  CKTOTAL 69   BNP (last 3 results) No results for input(s): BNP in the last 8760 hours. HbA1C: No results for input(s): HGBA1C in the last 72 hours. CBG: Recent Labs  Lab 04/14/24 1304 04/14/24 1448 04/14/24 1523 04/14/24 1553  GLUCAP >600* >600* >600* >600*   Lipid Profile: No results for input(s): CHOL, HDL, LDLCALC, TRIG, CHOLHDL, LDLDIRECT in the last 72 hours. Thyroid  Function Tests: Recent Labs    04/14/24 1311  TSH <0.100*  FREET4 1.02   Anemia Panel: No results for input(s): VITAMINB12, FOLATE, FERRITIN, TIBC, IRON, RETICCTPCT in the last 72 hours. Urine analysis:    Component Value Date/Time   COLORURINE STRAW (A) 04/14/2024 1311   APPEARANCEUR CLEAR (A) 04/14/2024 1311   LABSPEC 1.027 04/14/2024 1311   PHURINE 5.0 04/14/2024 1311   GLUCOSEU >=500 (A) 04/14/2024 1311   HGBUR NEGATIVE 04/14/2024 1311   BILIRUBINUR NEGATIVE 04/14/2024 1311   KETONESUR 20 (A) 04/14/2024 1311   PROTEINUR NEGATIVE 04/14/2024 1311   UROBILINOGEN 0.2 01/19/2012 2019   NITRITE POSITIVE (A) 04/14/2024 1311   LEUKOCYTESUR NEGATIVE 04/14/2024 1311    Radiological Exams on Admission: I have personally reviewed images CT ABDOMEN PELVIS WO CONTRAST Result Date: 04/14/2024 CLINICAL DATA:  Acute generalized abdominal pain EXAM: CT ABDOMEN AND PELVIS WITHOUT CONTRAST TECHNIQUE: Multidetector CT imaging of the abdomen and pelvis was performed following the standard protocol without IV contrast. RADIATION DOSE REDUCTION: This exam was performed according to the departmental dose-optimization program which includes automated exposure control, adjustment of the mA and/or kV  according to patient size and/or use of iterative reconstruction technique. COMPARISON:  November 10, 2021 FINDINGS: Lower chest: No acute abnormality. Hepatobiliary: No focal liver abnormality is seen. No gallstones, gallbladder wall thickening, or biliary dilatation. 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 No pancreatic ductal dilatation or surrounding inflammatory changes. Spleen: Status post splenectomy. Adrenals/Urinary Tract: Adrenal glands are unremarkable. Mild right renal atrophy  is noted. No hydronephrosis or renal obstruction is noted. Bladder is unremarkable. Stomach/Bowel: Stomach is within normal limits. Appendix appears normal. No evidence of bowel wall thickening, distention, or inflammatory changes. Vascular/Lymphatic: Aortic atherosclerosis. No enlarged abdominal or pelvic lymph nodes. Reproductive: Status post hysterectomy. No adnexal masses. Other: No abdominal wall hernia or abnormality. No abdominopelvic ascites. Musculoskeletal: No acute or significant osseous findings. IMPRESSION: 1. 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 2. Mild right renal atrophy. 3. No acute abnormality seen in the abdomen or pelvis. 4. Aortic atherosclerosis. Aortic Atherosclerosis (ICD10-I70.0). Electronically Signed   By: Lynwood Landy Raddle M.D.   On: 04/14/2024 14:13   DG Chest Portable 1 View Result Date: 04/14/2024 CLINICAL DATA:  Altered mental status EXAM: PORTABLE CHEST 1 VIEW COMPARISON:  October 09, 2021 FINDINGS: The heart size and mediastinal contours are within normal limits. Both lungs are clear. Moderately displaced and comminuted fracture is seen involving the proximal left humerus. IMPRESSION: No active cardiopulmonary disease. Moderately displaced and comminuted proximal left humeral fracture. Electronically Signed   By: Lynwood Landy Raddle M.D.   On: 04/14/2024 14:06   CT HEAD WO CONTRAST ( ) Result Date:  04/14/2024 EXAM: CT HEAD WITHOUT CONTRAST 04/14/2024 01:40:08 PM TECHNIQUE: CT of the head was performed without the administration of intravenous contrast. Automated exposure control, iterative reconstruction, and/or weight based adjustment of the mA/kV was utilized to reduce the radiation dose to as low as reasonably achievable. COMPARISON: CT head 03/29/2014. MRI brain 10/24/2021. CLINICAL HISTORY: Head trauma, abnormal mental status (Age 25-64y). FINDINGS: BRAIN AND VENTRICLES: No acute hemorrhage. No evidence of acute infarct. No hydrocephalus. No extra-axial collection. No mass effect or midline shift. ORBITS: No acute abnormality. SINUSES: No acute abnormality. SOFT TISSUES AND SKULL: No acute soft tissue abnormality. No skull fracture. IMPRESSION: 1. No acute intracranial abnormality. Electronically signed by: Ryan Chess MD 04/14/2024 01:45 PM EST RP Workstation: [id number]    EKG: My personal interpretation of EKG shows: Sinus rhythm without any acute ST changes.    Assessment/Plan Principal Problem:   DKA (diabetic ketoacidosis) (HCC) Active Problems:   Hyperlipidemia   GERD (gastroesophageal reflux disease)   Fibromyalgia   Essential hypertension   CAD (coronary artery disease)   Uncontrolled type 2 diabetes mellitus with hyperglycemia, with long-term current use of insulin  (HCC)   Pt presented with lethargy and confusion with severe hyperglycemia and found to be in DKA. Secondary to insulin  non-adherence and likely triggered by UTI.  Patient will be admitted to stepdown unit given need for insulin  drip. - Start on insulin  drip per protocol - potassium supplementation as needed to keep K>4 - Normal saline/LR at 150 mL/hr until CBG less than 250 - Switch to D5-1/2 NS when 1 CBG less than 250 - Nothing by mouth  - BMET every 4 hours - CBG Q1H - Once anion gap closed 2, start Carb Modified diet and if able to eat, administer Lantus  per transition protocol - Continue insulin   drip for 1-2 more hours, then discontinue and start SSI-S   AKI: Getting IVF.  Prerenal etiology.  Will trend.  Avoid nephrotoxic agents.  Renally dose meds.  Anion gap metabolic acidosis: Secondary to DKA.  She is getting bicarb.  Will repeat VBG in p.m.  This will improve with treatment of DKA.  UTI?  Could be likely precipitating factor.  UA does show signs of infection.  Urine culture is pending.  Does not appear to be septic.  Will hold off antibiotics until urine  culture results.  Portal venous cavernous thrombosis: chronic. Needs outpatient GI follow up.   Humerus fracture: in sling. Needs outpatient ortho follow up per last EDP note.   Hypertension: Patient on lisinopril  outpatient.  Will hold given AKI.  Use IV meds as needed.  Patient currently normotensive.  Hyperlipidemia: Continue home statin, patient n.p.o. as encephalopathy limits p.o. intake   Fibromyalgia: Continue home SNRI when patient able to take p.o.  GERD: Continue home PPI and patient taking p.o. VTE prophylaxis:  SQ Heparin   Diet: N.p.o. Code Status:  Full Code Telemetry:  Admission status: Inpatient, Step Down Unit Patient is from: Home Anticipated d/c is to: Home Anticipated d/c is in: 2-3 days   Family Communication: Updated at bedside  Consults called: None   Severity of Illness: The appropriate patient status for this patient is INPATIENT. Inpatient status is judged to be reasonable and necessary in order to provide the required intensity of service to ensure the patient's safety. The patient's presenting symptoms, physical exam findings, and initial radiographic and laboratory data in the context of their chronic comorbidities is felt to place them at high risk for further clinical deterioration. Furthermore, it is not anticipated that the patient will be medically stable for discharge from the hospital within 2 midnights of admission.   * I certify that at the point of admission it is my clinical  judgment that the patient will require inpatient hospital care spanning beyond 2 midnights from the point of admission due to high intensity of service, high risk for further deterioration and high frequency of surveillance required.DEWAINE Morene Bathe, MD [facility]. [facility]      [1]  Allergies Allergen Reactions   Aspartame And Phenylalanine Anaphylaxis, Shortness Of Breath and Swelling    Throat swells  ANY ARTIFICAL SWEETNERS   Toradol  [Ketorolac  Tromethamine ] Other (See Comments)    Face & neck flushed red & pt felt very hot after IV adm.   Sucralose Rash    Pt.s face broke out in rash after drinking Breeza.  Pt. Claims it is from the artificial sweetener.      Codeine Swelling   Dilaudid  [Hydromorphone  Hcl] Itching   Hydrocodone  Itching   Oxycodone  Itching   "

## 2024-04-14 NOTE — ED Triage Notes (Signed)
 BIBEMS from home. On EMS arrival pt was altered and lethargic. Pt has hx of diabetes, on CBG check glucometer read HIGH. 300 mL of NS given to pt en route. Pt was seen 3 weeks ago for a fall in ED and was found to have a broken L shoulder. Pt's current caregiver is 58 yo mom, and according to caregiver pt has not had anything solid to eat for about 2 days.   EMS VS:   98 F 128/75 19 RR 102 HR 100% on RA

## 2024-04-14 NOTE — ED Provider Notes (Signed)
 "  [facility] Provider Note    Event Date/Time   First MD Initiated Contact with Patient 04/14/24 1256     (approximate)   History   Altered Mental Status   HPI  [name] is a 58 y.o. female who comes in from home due to altered mental status.  Patient has a history of diabetes.  Patient's glucose monitor was reading as high.  Patient given normal saline and route.  Patient did have a fall 3 weeks ago onto her left shoulders and a sling.  According to the family's mom she is not been able to eat anything in the past 2 days.  Difficult to get full HPI from patient but maybe some abdominal pain able to state that she does she is in the hospital but unclear why she is here unable to tell me the year.     Physical Exam   Triage Vital Signs: ED Triage Vitals  Encounter Vitals Group     BP      Girls Systolic BP Percentile      Girls Diastolic BP Percentile      Boys Systolic BP Percentile      Boys Diastolic BP Percentile      Pulse      Resp      Temp      Temp src      SpO2      Weight      Height      Head Circumference      Peak Flow      Pain Score      Pain Loc      Pain Education      Exclude from Growth Chart     Most recent vital signs: Vitals:   04/14/24 1310  BP: 122/70  Pulse: 93  Resp: 18  Temp: 98.1 F (36.7 C)  SpO2: 100%     General: Awake, no distress.  CV:  Good peripheral perfusion.  Resp:  Normal effort.  Abd:  No distention.  Maybe some tenderness noted but difficult to assess Other:  Able to wiggle her toes left arm is in a sling.  Able to have grip strength but alert and oriented x 1   ED Results / Procedures / Treatments   Labs (all labs ordered are listed, but only abnormal results are displayed) Labs Reviewed  CBG MONITORING, ED - Abnormal; Notable for the following components:      Result Value   Glucose-Capillary >600 (*)    All other components within normal limits     EKG  My  interpretation of EKG:  Sinus rate 94 without any ST elevation, T wave inversions in V3 through V6, normal intervals  RADIOLOGY I have reviewed the xray personally and interpreted no evidence of any pneumonia   PROCEDURES:  Critical Care performed: Yes, see critical care procedure note(s)  .1-3 Lead EKG Interpretation  Performed by: Ernest Ronal BRAVO, MD Authorized by: Ernest Ronal BRAVO, MD     Interpretation: normal     ECG rate:  90   ECG rate assessment: normal     Rhythm: sinus rhythm     Ectopy: none     Conduction: normal   .Critical Care  Performed by: Ernest Ronal BRAVO, MD Authorized by: Ernest Ronal BRAVO, MD   Critical care provider statement:    Critical care time (minutes):  30   Critical care was necessary to treat or prevent imminent or life-threatening  deterioration of the following conditions:  Endocrine crisis   Critical care was time spent personally by me on the following activities:  Development of treatment plan with patient or surrogate, discussions with consultants, evaluation of patient's response to treatment, examination of patient, ordering and review of laboratory studies, ordering and review of radiographic studies, ordering and performing treatments and interventions, pulse oximetry, re-evaluation of patient's condition and review of old charts    MEDICATIONS ORDERED IN ED: Medications  insulin  regular, human (MYXREDLIN ) 100 units/ 100 mL infusion (has no administration in time range)  lactated ringers  infusion (has no administration in time range)  dextrose  5 % in lactated ringers  infusion (has no administration in time range)  dextrose  50 % solution 0-50 mL (has no administration in time range)  potassium chloride  10 mEq in 100 mL IVPB (has no administration in time range)  lactated ringers  bolus 1,000 mL (has no administration in time range)  lactated ringers  bolus 1,000 mL (has no administration in time range)     IMPRESSION / MDM / ASSESSMENT AND PLAN /  ED COURSE  I reviewed the triage vital signs and the nursing notes.   Patient's presentation is most consistent with acute presentation with potential threat to life or bodily function.   Patient comes in with concerns for lethargy, altered mental status no known trauma and patient is able to follow some brief commands but is oriented only x 1 therefore CT imaging or the head to make sure no evidence intracranial hemorrhage I did also order a CT abdomen given his difficult to tell if she is having any abdominal pain.  However I mostly concerned about electrolyte abnormalities, DKA or although I will get workup for altered mental status as well  Patient's white count is elevated but otherwise does not meet any sepsis criteria.  No source for infection.  Waiting on UA - still penidng - can do IO.  Patient is noted to be in DKA with glucoses over 900 with low bicarb and potassium of 4.5.  Patient's VBG does show acidosis CK is normal troponin slightly elevated but more likely demand.  Free T4 is reassuring.  Her CT imaging was negative for acute pathology  Will give patient fluid resuscitation and start patient on insulin  due to concerns for DKA.  Will discuss to hospital team for admission  The patient is on the cardiac monitor to evaluate for evidence of arrhythmia and/or significant heart rate changes.      FINAL CLINICAL IMPRESSION(S) / ED DIAGNOSES   Final diagnoses:  Altered mental status, unspecified altered mental status type  Diabetic ketoacidosis without coma associated with other specified diabetes mellitus (HCC)     Rx / DC Orders   ED Discharge Orders     None        Note:  This document was prepared using Dragon voice recognition software and may include unintentional dictation errors.   Ernest Ronal BRAVO, MD 04/14/24 1425  "

## 2024-04-14 NOTE — Progress Notes (Signed)
"  ° °      Overnight   NAME: [name] MRN: [id number] DOB : Jul 10, 1965    Date of Service   04/14/2024   HPI/Events of Note   HPI:  58 y.o. year old female with medical history of hypertension, hyperlipidemia, type 2 diabetes presenting to the ED with confusion and somnolence.  Patient was brought here by her mother who states patient has not had significant p.o. intake in the last 2 days.  Patient is somnolent on exam and unable to contribute to history. On arrival to the ED patient was noted to be HDS stable.  Lab work and imaging obtained.  CBC with leukocytosis at 16.7, mild anemia at 11.1 significant thrombocytosis at 806.  CMP with severe hyperglycemia at 957 with significant anion gap metabolic acidosis with bicarb at 10 and AKI and hypercalcium which are likely mediated by dehydration.  Magnesium  was checked and 2.7.  Serum osmolality elevated at 373, BHB greater than 8.  VBG shows severe acidemia at 7.1 4/32/43.  TSH checked and less than 0.100 likely in setting of acute illness.  UA shows nitrite positive and bacteria.  Urine culture pending.  Ammonia level normal.  Patient's position consistent with DKA so DKA protocol initiated.    Overnight:  pt remains hypokalemic, hypophosphatemia and hypernatremic.   Physical Exam Vitals and nursing note reviewed.  Eyes:     Pupils: Pupils are equal, round, and reactive to light.  Cardiovascular:     Rate and Rhythm: Normal rate and regular rhythm.     Pulses:          Radial pulses are 2+ on the right side and 2+ on the left side.     Heart sounds: S1 normal and S2 normal.  Pulmonary:     Comments: Plural friction rub noted to upper lobes   Abdominal:     General: Abdomen is flat. Bowel sounds are normal.  Musculoskeletal:        General: No swelling.  Skin:    General: Skin is warm and dry.     Capillary Refill: Capillary refill takes 2 to 3 seconds.  Neurological:     Mental Status: She is lethargic.     GCS: GCS eye subscore  is 4. GCS verbal subscore is 3. GCS motor subscore is 5.       Interventions/ Plan   Potassium replacement  Phos replacement  Change fluid to D5 1/2 normal       Laneta Gardener- Industrial/product Designer CCRN AGACNP-BC Acute Care Nurse Practitioner [facility]  "

## 2024-04-15 ENCOUNTER — Other Ambulatory Visit: Payer: Self-pay

## 2024-04-15 ENCOUNTER — Encounter: Payer: Self-pay | Admitting: Internal Medicine

## 2024-04-15 ENCOUNTER — Inpatient Hospital Stay

## 2024-04-15 DIAGNOSIS — E1110 Type 2 diabetes mellitus with ketoacidosis without coma: Secondary | ICD-10-CM | POA: Diagnosis not present

## 2024-04-15 LAB — GLUCOSE, CAPILLARY
Glucose-Capillary: 123 mg/dL — ABNORMAL HIGH (ref 70–99)
Glucose-Capillary: 140 mg/dL — ABNORMAL HIGH (ref 70–99)
Glucose-Capillary: 159 mg/dL — ABNORMAL HIGH (ref 70–99)
Glucose-Capillary: 162 mg/dL — ABNORMAL HIGH (ref 70–99)
Glucose-Capillary: 164 mg/dL — ABNORMAL HIGH (ref 70–99)
Glucose-Capillary: 170 mg/dL — ABNORMAL HIGH (ref 70–99)
Glucose-Capillary: 173 mg/dL — ABNORMAL HIGH (ref 70–99)
Glucose-Capillary: 186 mg/dL — ABNORMAL HIGH (ref 70–99)
Glucose-Capillary: 187 mg/dL — ABNORMAL HIGH (ref 70–99)
Glucose-Capillary: 187 mg/dL — ABNORMAL HIGH (ref 70–99)
Glucose-Capillary: 190 mg/dL — ABNORMAL HIGH (ref 70–99)
Glucose-Capillary: 194 mg/dL — ABNORMAL HIGH (ref 70–99)
Glucose-Capillary: 196 mg/dL — ABNORMAL HIGH (ref 70–99)
Glucose-Capillary: 199 mg/dL — ABNORMAL HIGH (ref 70–99)
Glucose-Capillary: 205 mg/dL — ABNORMAL HIGH (ref 70–99)
Glucose-Capillary: 208 mg/dL — ABNORMAL HIGH (ref 70–99)
Glucose-Capillary: 213 mg/dL — ABNORMAL HIGH (ref 70–99)
Glucose-Capillary: 227 mg/dL — ABNORMAL HIGH (ref 70–99)
Glucose-Capillary: 239 mg/dL — ABNORMAL HIGH (ref 70–99)
Glucose-Capillary: 89 mg/dL (ref 70–99)
Glucose-Capillary: 94 mg/dL (ref 70–99)

## 2024-04-15 LAB — BLOOD GAS, VENOUS
Acid-Base Excess: 2.8 mmol/L — ABNORMAL HIGH (ref 0.0–2.0)
Bicarbonate: 30.1 mmol/L — ABNORMAL HIGH (ref 20.0–28.0)
O2 Saturation: 86.4 %
Patient temperature: 37
pCO2, Ven: 57 mmHg (ref 44–60)
pH, Ven: 7.33 (ref 7.25–7.43)
pO2, Ven: 52 mmHg — ABNORMAL HIGH (ref 32–45)

## 2024-04-15 LAB — BASIC METABOLIC PANEL WITH GFR
Anion gap: 9 (ref 5–15)
Anion gap: 7 (ref 5–15)
Anion gap: 8 (ref 5–15)
BUN: 18 mg/dL (ref 6–20)
BUN: 17 mg/dL (ref 6–20)
BUN: 14 mg/dL (ref 6–20)
CO2: 27 mmol/L (ref 22–32)
CO2: 29 mmol/L (ref 22–32)
CO2: 27 mmol/L (ref 22–32)
Calcium: 9.5 mg/dL (ref 8.9–10.3)
Calcium: 9.1 mg/dL (ref 8.9–10.3)
Calcium: 8.7 mg/dL — ABNORMAL LOW (ref 8.9–10.3)
Chloride: 111 mmol/L (ref 98–111)
Chloride: 110 mmol/L (ref 98–111)
Chloride: 112 mmol/L — ABNORMAL HIGH (ref 98–111)
Creatinine, Ser: 1.07 mg/dL — ABNORMAL HIGH (ref 0.44–1.00)
Creatinine, Ser: 1.1 mg/dL — ABNORMAL HIGH (ref 0.44–1.00)
Creatinine, Ser: 1.01 mg/dL — ABNORMAL HIGH (ref 0.44–1.00)
GFR, Estimated: 60 mL/min — ABNORMAL LOW
GFR, Estimated: 58 mL/min — ABNORMAL LOW
GFR, Estimated: >60 mL/min
Glucose, Bld: 215 mg/dL — ABNORMAL HIGH (ref 70–99)
Glucose, Bld: 218 mg/dL — ABNORMAL HIGH (ref 70–99)
Glucose, Bld: 95 mg/dL (ref 70–99)
Potassium: 3.7 mmol/L (ref 3.5–5.1)
Potassium: 4 mmol/L (ref 3.5–5.1)
Potassium: 3.6 mmol/L (ref 3.5–5.1)
Sodium: 146 mmol/L — ABNORMAL HIGH (ref 135–145)
Sodium: 145 mmol/L (ref 135–145)
Sodium: 147 mmol/L — ABNORMAL HIGH (ref 135–145)

## 2024-04-15 LAB — CBC
HCT: 28.9 % — ABNORMAL LOW (ref 36.0–46.0)
Hemoglobin: 9.8 g/dL — ABNORMAL LOW (ref 12.0–15.0)
MCH: 31.2 pg (ref 26.0–34.0)
MCHC: 33.9 g/dL (ref 30.0–36.0)
MCV: 92 fL (ref 80.0–100.0)
Platelets: 686 K/uL — ABNORMAL HIGH (ref 150–400)
RBC: 3.14 MIL/uL — ABNORMAL LOW (ref 3.87–5.11)
RDW: 15.2 % (ref 11.5–15.5)
WBC: 19.5 K/uL — ABNORMAL HIGH (ref 4.0–10.5)
nRBC: 0.1 % (ref 0.0–0.2)

## 2024-04-15 LAB — PHOSPHORUS: Phosphorus: 2.6 mg/dL (ref 2.5–4.6)

## 2024-04-15 LAB — LACTIC ACID, PLASMA: Lactic Acid, Venous: 1.7 mmol/L (ref 0.5–1.9)

## 2024-04-15 LAB — BETA-HYDROXYBUTYRIC ACID
Beta-Hydroxybutyric Acid: 0.51 mmol/L — ABNORMAL HIGH (ref 0.05–0.27)
Beta-Hydroxybutyric Acid: 0.29 mmol/L — ABNORMAL HIGH (ref 0.05–0.27)
Beta-Hydroxybutyric Acid: <0.05 mmol/L — ABNORMAL LOW (ref 0.05–0.27)

## 2024-04-15 MED ORDER — ACETAMINOPHEN 650 MG RE SUPP: 650.0000 mg | Freq: Four times a day (QID) | RECTAL | Status: DC | PRN

## 2024-04-15 MED ORDER — ACETAMINOPHEN 10 MG/ML IV SOLN
1000.0000 mg | Freq: Three times a day (TID) | INTRAVENOUS | Status: AC
  Administered 2024-04-15 – 2024-04-16 (×3): 1000 mg via INTRAVENOUS
  Filled 2024-04-15 (×3): qty 100

## 2024-04-15 MED ORDER — ORAL CARE MOUTH RINSE: 15.0000 mL | OROMUCOSAL | Status: DC | PRN

## 2024-04-15 MED ORDER — SODIUM CHLORIDE 0.9 % IV SOLN
1.0000 g | INTRAVENOUS | Status: AC
  Administered 2024-04-15 – 2024-04-19 (×5): 1 g via INTRAVENOUS
  Filled 2024-04-15 (×4): qty 10

## 2024-04-15 MED ORDER — INSULIN GLARGINE-YFGN 100 UNIT/ML ~~LOC~~ SOPN: 10.0000 [IU] | PEN_INJECTOR | Freq: Once | SUBCUTANEOUS | Status: DC

## 2024-04-15 MED ORDER — PANTOPRAZOLE SODIUM 40 MG IV SOLR
40.0000 mg | INTRAVENOUS | Status: DC
  Administered 2024-04-15: 40 mg via INTRAVENOUS
  Filled 2024-04-15: qty 10

## 2024-04-15 MED ORDER — INSULIN ASPART 100 UNIT/ML IJ SOLN
0.0000 [IU] | INTRAMUSCULAR | Status: DC
  Administered 2024-04-15: 2 [IU] via SUBCUTANEOUS
  Administered 2024-04-15: 3 [IU] via SUBCUTANEOUS
  Filled 2024-04-15: qty 3
  Filled 2024-04-15: qty 2

## 2024-04-15 MED ORDER — INSULIN ASPART 100 UNIT/ML IJ SOLN
0.0000 [IU] | INTRAMUSCULAR | Status: DC
  Administered 2024-04-15: 2 [IU] via SUBCUTANEOUS
  Filled 2024-04-15: qty 2

## 2024-04-15 MED ORDER — ACETAMINOPHEN 325 MG PO TABS: 650.0000 mg | ORAL_TABLET | Freq: Four times a day (QID) | ORAL | Status: DC | PRN

## 2024-04-15 MED ORDER — INSULIN ASPART 100 UNIT/ML IJ SOLN
0.0000 [IU] | INTRAMUSCULAR | Status: DC
  Administered 2024-04-16 (×2): 7 [IU] via SUBCUTANEOUS
  Filled 2024-04-15 (×2): qty 7

## 2024-04-15 MED ORDER — INSULIN GLARGINE 100 UNIT/ML ~~LOC~~ SOLN
10.0000 [IU] | Freq: Once | SUBCUTANEOUS | Status: AC
  Administered 2024-04-15: 10 [IU] via SUBCUTANEOUS
  Filled 2024-04-15: qty 0.1

## 2024-04-15 MED ORDER — INSULIN ASPART 100 UNIT/ML IJ SOLN: 0.0000 [IU] | Freq: Three times a day (TID) | INTRAMUSCULAR | Status: DC

## 2024-04-15 MED ORDER — LIDOCAINE 5 % EX PTCH
1.0000 | MEDICATED_PATCH | Freq: Once | CUTANEOUS | Status: AC
  Administered 2024-04-15: 1 via TRANSDERMAL
  Filled 2024-04-15: qty 1

## 2024-04-15 NOTE — Progress Notes (Signed)
 PT Cancellation Note  Patient Details Name: [name] MRN: [id number] DOB: [birth year]   Cancelled Treatment:    Reason Eval/Treat Not Completed: Fatigue/lethargy limiting ability to participate (Consult received and chart reviewed.  Patient grossly lethargic and unable to awaken, maintain alertness for particiaption with session.  Will continue to follow and re-attempt as medically appropriate.)   [name] H. Delores, PT, DPT, NCS 04/15/2024, 1:13 PM [phone]

## 2024-04-15 NOTE — Plan of Care (Signed)
" °  Problem: Clinical Measurements: Goal: Ability to maintain clinical measurements within normal limits will improve Outcome: Progressing Goal: Diagnostic test results will improve Outcome: Progressing Goal: Respiratory complications will improve Outcome: Progressing Goal: Cardiovascular complication will be avoided Outcome: Progressing   Problem: Elimination: Goal: Will not experience complications related to urinary retention Outcome: Progressing   Problem: Pain Managment: Goal: General experience of comfort will improve and/or be controlled Outcome: Progressing   Problem: Activity: Goal: Risk for activity intolerance will decrease Outcome: Not Progressing   "

## 2024-04-15 NOTE — Progress Notes (Signed)
 " PROGRESS NOTE    [name]   FMW:[id number] DOB: 07-May-1965  DOA: 04/14/2024 Date of Service: 04/15/2024 which is hospital day 1  PCP: [facility] Hospital course / significant events:   HPI: [name] is a 58 y.o. year old female with medical history of hypertension, hyperlipidemia, type 2 diabetes presenting to the ED with confusion and somnolence.  Patient was brought here by her mother who states patient has not had significant p.o. intake in the last 2 days.    12/26: On arrival to the ED patient was noted to be HDS stable.  Lab work and imaging obtained.  CBC with leukocytosis at 16.7, mild anemia at 11.1 significant thrombocytosis at 806.  CMP with severe hyperglycemia at 957 with significant anion gap metabolic acidosis with bicarb at 10 and AKI and hypercalcium which are likely mediated by dehydration.  Magnesium  was checked and 2.7.  Serum osmolality elevated at 373, BHB greater than 8.  VBG shows severe acidemia at 7.1 4/32/43.  TSH checked and less than 0.100 likely in setting of acute illness.  UA shows nitrite positive and bacteria.  Urine culture pending.  Ammonia level normal.  Patient's position consistent with DKA so DKA protocol initiated.  Given presentation, [facility] contacted for admission. DKA protocol in stepdown unit 12/27: DKA resolved pt still somnolent though she is following commands, c/o shoulder pain, not answering other questions. Known humerus fx - confirmed w/ ortho on call Dr Lorelle to review XR - shows some displacement but nothing to do operatively at this time. MRI brain given persistent encephalopathy, question TBI / CVA. Echocardiogram ordered to eval possible syncope cause for falling but suspect DKA had been developing for some time and causing weakness/fall.      Consultants:   none  Procedures/Surgeries: none      ASSESSMENT & PLAN:   DKA - resolved AGMA - resolved  Secondary to insulin   non-adherence  insulin  drip --> subcu per protocol  Of note pt still encephalopathic and not safe for po intake but hyperglycemia and AG have resolved so ok for off insulin  gtt.  Low dose basal and low dose SSI while NPO  Maintain D5 fluids while NPO but will lower rate   Acute metabolic encephalopathy Persisting despite resolution DKA MRI brain Consider EEG if no other explanation on MRI  NPO for now   Multiple falls at home Likely weakness/illness related to DKA Possible other syncope etiology Question TBI / concussion  Echo Telemetry Consider CTA H/N or carotid US   AKI - resolved/improved w/ hydration Maintatin IV fluids while NPO  Monitor BMP  Abn UA but no overt UTI Nitrite(+) Pending culture will treat presumptively as might be contributing to encephalopathy/fall  Reocephin   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  Needs outpatient GI follow up.   Humerus fracture Maintain sling.  outpatient ortho follow up  Pain control - opiate allergy swelling, unable to confirm reaction d/t encephalopathy, will give Tylenol  IV prn while npo   Hypertension:  on lisinopril  and HCT outpatient.  Will hold given AKI. IV antihypertensive prn    Hyperlipidemia Resume statin when taking po    Fibromyalgia Chronic pain  Resume home SNRI, tramadol  when patient able to take p.o. ?Lyrica  and/or gapbapentin will confirm but npo for now    GERD  PPI IV while NPO    overweight based on BMI: Body mass index is 27.1 kg/m.SABRA Significantly low or  high BMI is associated with higher medical risk.  Underweight - under 18  overweight - 25 to 29 obese - 30 or more Class 1 obesity: BMI of 30.0 to 34 Class 2 obesity: BMI of 35.0 to 39 Class 3 obesity: BMI of 40.0 to 49 Super Morbid Obesity: BMI 50-59 Super-super Morbid Obesity: BMI 60+ Healthy nutrition and physical activity advised as adjunct to other disease management and risk reduction  treatments    DVT prophylaxis: heparin  w/ AKI IV fluids: D5 0.5NS continuous IV fluids  Nutrition: npo Central lines / other devices: none  Code Status: FULL CODE ACP documentation reviewed:  none on file in [facility]  TOC needs: TBD Medical barriers to dispo: encephalopathy, resolving DKA. Expected medical readiness for discharge few days.              Subjective / Brief ROS:  Patient unable to contibute other than shoulder hurts  Daughter at bedside confirms HPI as able - she notes that her grandmother who lives with the patient is also not a reliable historian   Family Communication: daughter at bedside on rounds     Objective Findings:  Vitals:   04/15/24 0708 04/15/24 0800 04/15/24 0900 04/15/24 1000  BP:  (!) 104/48 (!) 107/55 (!) 95/53  Pulse:  73 69 78  Resp:  17 17 18   Temp: (P) 98.5 F (36.9 C)     TempSrc: (P) Oral     SpO2:  98% 98% 97%  Weight:        Intake/Output Summary (Last 24 hours) at 04/15/2024 1511 Last data filed at 04/15/2024 1300 Gross per 24 hour  Intake 6424.57 ml  Output 750 ml  Net 5674.57 ml   Filed Weights   04/14/24 1830  Weight: 69.4 kg    Examination:  Physical Exam Constitutional:      General: She is not in acute distress.    Appearance: She is not ill-appearing.  Cardiovascular:     Rate and Rhythm: Normal rate and regular rhythm.  Pulmonary:     Effort: Pulmonary effort is normal. No respiratory distress.     Breath sounds: No wheezing.  Abdominal:     General: Abdomen is flat. Bowel sounds are normal.  Musculoskeletal:     Right lower leg: No edema.     Left lower leg: No edema.  Neurological:     Mental Status: She is disoriented.     Comments: Follows commands. Note exam LUE limited d/t humerus fx but can squeeze R hand, move bilateral LE symmetrically          Scheduled Medications:   Chlorhexidine  Gluconate Cloth  6 each Topical Q0600   heparin   5,000 Units Subcutaneous Q8H   insulin   glargine  10 Units Subcutaneous Once   lidocaine   1 patch Transdermal Once   pantoprazole  (PROTONIX ) IV  40 mg Intravenous Q24H   sodium chloride  flush  3 mL Intravenous Q12H    Continuous Infusions:  acetaminophen      cefTRIAXone  (ROCEPHIN )  IV     dextrose  5 % and 0.45 % NaCl 125 mL/hr at 04/15/24 1501   insulin  4.4 Units/hr (04/15/24 1502)    PRN Medications:  dextrose , ondansetron  **OR** ondansetron  (ZOFRAN ) IV, mouth rinse, senna-docusate  Antimicrobials from admission:  Anti-infectives (From admission, onward)    Start     Dose/Rate Route Frequency Ordered Stop   04/15/24 1600  cefTRIAXone  (ROCEPHIN ) 1 g in sodium chloride  0.9 % 100 mL IVPB  1 g 200 mL/hr over 30 Minutes Intravenous Every 24 hours 04/15/24 1509             Data Reviewed:  I have personally reviewed the following...  CBC: Recent Labs  Lab 04/14/24 1311 04/15/24 0318  WBC 16.7* 19.5*  NEUTROABS 14.7*  --   HGB 11.1* 9.8*  HCT 34.6* 28.9*  MCV 98.9 92.0  PLT 806* 686*   Basic Metabolic Panel: Recent Labs  Lab 04/14/24 1311 04/14/24 1616 04/14/24 1956 04/14/24 2208 04/15/24 0318 04/15/24 0632  NA 140 141 147* 151* 146* 145  K 4.5 4.2 3.2* 3.0* 3.7 4.0  CL 100 103 111 113* 111 110  CO2 10* 11* 21* 24 27 29   GLUCOSE 957* 854* 467* 283* 215* 218*  BUN 27* 28* 23* 21* 18 17  CREATININE 1.60* 1.54* 1.26* 1.14* 1.07* 1.10*  CALCIUM  10.4* 10.2 9.9 10.0 9.5 9.1  MG 2.7*  --  2.3  --   --   --   PHOS  --   --   --  <1.0*  --  2.6   GFR: Estimated Creatinine Clearance: 51.5 mL/min (A) (by C-G formula based on SCr of 1.1 mg/dL (H)). Liver Function Tests: Recent Labs  Lab 04/14/24 1311  AST <10*  ALT 12  ALKPHOS 208*  BILITOT 0.3  PROT 7.0  ALBUMIN  3.6   No results for input(s): LIPASE, AMYLASE in the last 168 hours. Recent Labs  Lab 04/14/24 1311  AMMONIA <13   Coagulation Profile: No results for input(s): INR, PROTIME in the last 168 hours. Cardiac  Enzymes: Recent Labs  Lab 04/14/24 1311  CKTOTAL 69   BNP (last 3 results) No results for input(s): PROBNP in the last 8760 hours. HbA1C: No results for input(s): HGBA1C in the last 72 hours. CBG: Recent Labs  Lab 04/15/24 1049 04/15/24 1203 04/15/24 1318 04/15/24 1411 04/15/24 1507  GLUCAP 186* 173* 164* 140* 89   Lipid Profile: No results for input(s): CHOL, HDL, LDLCALC, TRIG, CHOLHDL, LDLDIRECT in the last 72 hours. Thyroid  Function Tests: Recent Labs    04/14/24 1311  TSH <0.100*  FREET4 1.02   Anemia Panel: No results for input(s): VITAMINB12, FOLATE, FERRITIN, TIBC, IRON, RETICCTPCT in the last 72 hours. Most Recent Urinalysis On File:     Component Value Date/Time   COLORURINE STRAW (A) 04/14/2024 1311   APPEARANCEUR CLEAR (A) 04/14/2024 1311   LABSPEC 1.027 04/14/2024 1311   PHURINE 5.0 04/14/2024 1311   GLUCOSEU >=500 (A) 04/14/2024 1311   HGBUR NEGATIVE 04/14/2024 1311   BILIRUBINUR NEGATIVE 04/14/2024 1311   KETONESUR 20 (A) 04/14/2024 1311   PROTEINUR NEGATIVE 04/14/2024 1311   UROBILINOGEN 0.2 01/19/2012 2019   NITRITE POSITIVE (A) 04/14/2024 1311   LEUKOCYTESUR NEGATIVE 04/14/2024 1311   Sepsis Labs: @LABRCNTIP (procalcitonin:4,lacticidven:4) Microbiology: Recent Results (from the past 240 hours)  Resp panel by RT-PCR (RSV, Flu A&B, Covid) Anterior Nasal Swab     Status: None   Collection Time: 04/14/24  1:11 PM   Specimen: Anterior Nasal Swab  Result Value Ref Range Status   SARS Coronavirus 2 by RT PCR NEGATIVE NEGATIVE Final    Comment: (NOTE) SARS-CoV-2 target nucleic acids are NOT DETECTED.  The SARS-CoV-2 RNA is generally detectable in upper respiratory specimens during the acute phase of infection. The lowest concentration of SARS-CoV-2 viral copies this assay can detect is 138 copies/mL. A negative result does not preclude SARS-Cov-2 infection and should not be used as the sole basis for treatment  or  other patient management decisions. A negative result may occur with  improper specimen collection/handling, submission of specimen other than nasopharyngeal swab, presence of viral mutation(s) within the areas targeted by this assay, and inadequate number of viral copies(<138 copies/mL). A negative result must be combined with clinical observations, patient history, and epidemiological information. The expected result is Negative.  Fact Sheet for Patients:  bloggercourse.com  Fact Sheet for Healthcare Providers:  seriousbroker.it  This test is no t yet approved or cleared by the [place]  FDA and  has been authorized for detection and/or diagnosis of SARS-CoV-2 by FDA under an Emergency Use Authorization (EUA). This EUA will remain  in effect (meaning this test can be used) for the duration of the COVID-19 declaration under Section 564(b)(1) of the Act, 21 U.S.C.section 360bbb-3(b)(1), unless the authorization is terminated  or revoked sooner.       Influenza A by PCR NEGATIVE NEGATIVE Final   Influenza B by PCR NEGATIVE NEGATIVE Final    Comment: (NOTE) The Xpert Xpress SARS-CoV-2/FLU/RSV plus assay is intended as an aid in the diagnosis of influenza from Nasopharyngeal swab specimens and should not be used as a sole basis for treatment. Nasal washings and aspirates are unacceptable for Xpert Xpress SARS-CoV-2/FLU/RSV testing.  Fact Sheet for Patients: bloggercourse.com  Fact Sheet for Healthcare Providers: seriousbroker.it  This test is not yet approved or cleared by the [place]  FDA and has been authorized for detection and/or diagnosis of SARS-CoV-2 by FDA under an Emergency Use Authorization (EUA). This EUA will remain in effect (meaning this test can be used) for the duration of the COVID-19 declaration under Section 564(b)(1) of the Act, 21 U.S.C. section  360bbb-3(b)(1), unless the authorization is terminated or revoked.     Resp Syncytial Virus by PCR NEGATIVE NEGATIVE Final    Comment: (NOTE) Fact Sheet for Patients: bloggercourse.com  Fact Sheet for Healthcare Providers: seriousbroker.it  This test is not yet approved or cleared by the [place]  FDA and has been authorized for detection and/or diagnosis of SARS-CoV-2 by FDA under an Emergency Use Authorization (EUA). This EUA will remain in effect (meaning this test can be used) for the duration of the COVID-19 declaration under Section 564(b)(1) of the Act, 21 U.S.C. section 360bbb-3(b)(1), unless the authorization is terminated or revoked.  Performed at [facility], [address] [place], [place] [address]   MRSA Next Gen by PCR, Nasal     Status: None   Collection Time: 04/14/24  6:32 PM   Specimen: Nasal Mucosa; Nasal Swab  Result Value Ref Range Status   MRSA by PCR Next Gen NOT DETECTED NOT DETECTED Final    Comment: (NOTE) The GeneXpert MRSA Assay (FDA approved for NASAL specimens only), is one component of a comprehensive MRSA colonization surveillance program. It is not intended to diagnose MRSA infection nor to guide or monitor treatment for MRSA infections. Test performance is not FDA approved in patients less than 42 years old. Performed at [facility], [address], [place], [place] [address]       Radiology Studies last 3 days: DG Humerus Left Result Date: 04/15/2024 CLINICAL DATA:  Left humeral fracture. EXAM: LEFT HUMERUS - 2+ VIEW COMPARISON:  Radiograph 03/30/2024 FINDINGS: Left proximal humerus fracture demonstrates increased displacement from prior exam. Osseous overriding of 7-8 mm. There is progressive apex lateral angulation. No internal or peripheral callus formation. No evidence of glenohumeral dislocation. The distal humerus is intact. Mild soft tissue edema persists.  IMPRESSION: Increased displacement  and angulation of left proximal humerus fracture. No internal or peripheral callus formation. Electronically Signed   By: Andrea Gasman M.D.   On: 04/15/2024 13:34   CT ABDOMEN PELVIS WO CONTRAST Result Date: 04/14/2024 CLINICAL DATA:  Acute generalized abdominal pain EXAM: CT ABDOMEN AND PELVIS WITHOUT CONTRAST TECHNIQUE: Multidetector CT imaging of the abdomen and pelvis was performed following the standard protocol without IV contrast. RADIATION DOSE REDUCTION: This exam was performed according to the departmental dose-optimization program which includes automated exposure control, adjustment of the mA and/or kV according to patient size and/or use of iterative reconstruction technique. COMPARISON:  November 10, 2021 FINDINGS: Lower chest: No acute abnormality. Hepatobiliary: No focal liver abnormality is seen. No gallstones, gallbladder wall thickening, or biliary dilatation. 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 No pancreatic ductal dilatation or surrounding inflammatory changes. Spleen: Status post splenectomy. Adrenals/Urinary Tract: Adrenal glands are unremarkable. Mild right renal atrophy is noted. No hydronephrosis or renal obstruction is noted. Bladder is unremarkable. Stomach/Bowel: Stomach is within normal limits. Appendix appears normal. No evidence of bowel wall thickening, distention, or inflammatory changes. Vascular/Lymphatic: Aortic atherosclerosis. No enlarged abdominal or pelvic lymph nodes. Reproductive: Status post hysterectomy. No adnexal masses. Other: No abdominal wall hernia or abnormality. No abdominopelvic ascites. Musculoskeletal: No acute or significant osseous findings. IMPRESSION: 1. 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 2. Mild right renal atrophy. 3. No acute abnormality seen in the abdomen or pelvis. 4. Aortic atherosclerosis. Aortic  Atherosclerosis (ICD10-I70.0). Electronically Signed   By: Lynwood Landy Raddle M.D.   On: 04/14/2024 14:13   DG Chest Portable 1 View Result Date: 04/14/2024 CLINICAL DATA:  Altered mental status EXAM: PORTABLE CHEST 1 VIEW COMPARISON:  October 09, 2021 FINDINGS: The heart size and mediastinal contours are within normal limits. Both lungs are clear. Moderately displaced and comminuted fracture is seen involving the proximal left humerus. IMPRESSION: No active cardiopulmonary disease. Moderately displaced and comminuted proximal left humeral fracture. Electronically Signed   By: Lynwood Landy Raddle M.D.   On: 04/14/2024 14:06   CT HEAD WO CONTRAST ( ) Result Date: 04/14/2024 EXAM: CT HEAD WITHOUT CONTRAST 04/14/2024 01:40:08 PM TECHNIQUE: CT of the head was performed without the administration of intravenous contrast. Automated exposure control, iterative reconstruction, and/or weight based adjustment of the mA/kV was utilized to reduce the radiation dose to as low as reasonably achievable. COMPARISON: CT head 03/29/2014. MRI brain 10/24/2021. CLINICAL HISTORY: Head trauma, abnormal mental status (Age 57-64y). FINDINGS: BRAIN AND VENTRICLES: No acute hemorrhage. No evidence of acute infarct. No hydrocephalus. No extra-axial collection. No mass effect or midline shift. ORBITS: No acute abnormality. SINUSES: No acute abnormality. SOFT TISSUES AND SKULL: No acute soft tissue abnormality. No skull fracture. IMPRESSION: 1. No acute intracranial abnormality. Electronically signed by: Ryan Chess MD 04/14/2024 01:45 PM EST RP Workstation: [id number]       Time spent: 50 min    Laneta Blunt, DO [facility] 04/15/2024, 3:11 PM    Dictation software may have been used to generate the above note. Typos may occur and escape review in typed/dictated notes. Please contact Dr Blunt directly for clarity if needed.  Staff may message me via secure chat in Epic  but this may not receive an  immediate response,  please page me for urgent matters!  If 7PM-7AM, please contact night coverage [email or web address]       "

## 2024-04-15 NOTE — Hospital Course (Addendum)
 Hospital course / significant events:   HPI: [name] is a 58 y.o. year old female with medical history of hypertension, hyperlipidemia, type 2 diabetes presenting to the ED with confusion and somnolence.  Patient was brought here by her mother who states patient has not had significant p.o. intake in the last 2 days.    12/26: On arrival to the ED patient was noted to be HDS stable.  Lab work and imaging obtained.  CBC with leukocytosis at 16.7, mild anemia at 11.1 significant thrombocytosis at 806.  CMP with severe hyperglycemia at 957 with significant anion gap metabolic acidosis with bicarb at 10 and AKI and hypercalcium which are likely mediated by dehydration.  Magnesium  was checked and 2.7.  Serum osmolality elevated at 373, BHB greater than 8.  VBG shows severe acidemia at 7.1 4/32/43.  TSH checked and less than 0.100 likely in setting of acute illness.  UA shows nitrite positive and bacteria.  Urine culture pending.  Ammonia level normal.  Patient's position consistent with DKA so DKA protocol initiated.  Given presentation, [facility] contacted for admission. DKA protocol in stepdown unit 12/27: DKA resolved pt still somnolent though she is following commands, c/o shoulder pain, not answering other questions. Known humerus fx - confirmed w/ ortho on call Dr Lorelle to review XR - shows some displacement but nothing to do operatively at this time. MRI brain given persistent encephalopathy, question TBI / CVA. Echocardiogram ordered to eval possible syncope cause for falling but suspect DKA had been developing for some time and causing weakness/fall.  12/28: much more alert today, conversational, oriented. Cannot recall home dose insulin  or other meds, not sure if she was taking insulin  per instructions. Adjusting insulin  as needed. 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 Daughter request discuss w/ ortho re humerus fracture, will try to reach out tomorrow to primary orthopedist      Consultants:    none  Procedures/Surgeries: none      ASSESSMENT & PLAN:   DKA - resolved AGMA - resolved  Secondary to insulin  non-adherence  insulin  drip --> subcu and doing well ,adjust as needed Low dose basal and low dose SSI increased now that not NPO   Acute metabolic encephalopathy - resolved  monitor  Multiple falls at home Likely weakness/illness related to DKA Possible other syncope etiology Question TBI / concussion  Echo no concrns Telemetry can DC  AKI - resolved/improved w/ hydration Monitor BMP  UTI Nitrite(+) Pending culture will treat presumptively as might be contributing to encephalopathy/fall  Rocephin    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  Needs outpatient GI follow up.   Humerus fracture Maintain sling.  outpatient ortho follow up  Pain control    Hypertension:  on lisinopril  and HCT outpatient.  Will hold given AKI. IV antihypertensive prn    Hyperlipidemia Resume statin when taking po    Fibromyalgia Chronic pain  Resume home SNRI, tramadol    GERD  PPI IV while NPO    overweight based on BMI: Body mass index is 27.1 kg/m.SABRA Significantly low or high BMI is associated with higher medical risk.  Underweight - under 18  overweight - 25 to 29 obese - 30 or more Class 1 obesity: BMI of 30.0 to 34 Class 2 obesity: BMI of 35.0 to 39 Class 3 obesity: BMI of 40.0 to 49 Super Morbid Obesity: BMI 50-59 Super-super Morbid Obesity: BMI 60+ Healthy nutrition and physical activity advised as adjunct to other disease management and risk reduction  treatments    DVT prophylaxis: heparin  w/ AKI IV fluids: D5 0.5NS continuous IV fluids  Nutrition: npo Central lines / other devices: none  Code Status: FULL CODE ACP documentation reviewed:  none on file in [facility]  TOC needs: TBD Medical barriers to dispo: encephalopathy, resolving DKA. Expected medical readiness for discharge few days.

## 2024-04-15 NOTE — Progress Notes (Addendum)
"  ° °      Overnight   NAME: [name] MRN: [id number] DOB : Dec 14, 1965    Date of Service   04/15/2024   HPI/Events of Note   HPI:  58 y.o. year old female with medical history of hypertension, hyperlipidemia, type 2 diabetes presenting to the ED with confusion and somnolence.  Patient was brought here by her mother who states patient has not had significant p.o. intake in the last 2 days.     12/26: On arrival to the ED patient was noted to be HDS stable.  Lab work and imaging obtained.  CBC with leukocytosis at 16.7, mild anemia at 11.1 significant thrombocytosis at 806.  CMP with severe hyperglycemia at 957 with significant anion gap metabolic acidosis with bicarb at 10 and AKI and hypercalcium which are likely mediated by dehydration.  Magnesium  was checked and 2.7.  Serum osmolality elevated at 373, BHB greater than 8.  VBG shows severe acidemia at 7.1 4/32/43.  TSH checked and less than 0.100 likely in setting of acute illness.  UA shows nitrite positive and bacteria.  Urine culture pending.  Ammonia level normal.  Patient's position consistent with DKA so DKA protocol initiated.  Given presentation, [facility] contacted for admission. DKA protocol in stepdown unit 12/27: DKA resolved pt still somnolent though she is following commands, c/o shoulder pain, not answering other questions. Known humerus fx - confirmed w/ ortho on call Dr Lorelle to review XR - shows some displacement but nothing to do operatively at this time. MRI brain given persistent encephalopathy, question TBI / CVA. Echocardiogram ordered to eval possible syncope cause for falling but suspect DKA had been developing for some time and causing weakness/fall.      Overnight: Hyperglycemia despite sliding scale insulin  dose.  Patient currently on D5 half-normal saline for hypernatremia/ NPO status @125 /hr.     Interventions/ Plan   Decreased dose of D5 half-normal saline to 75 cc an hour    Updates 2300-patient continues to  be hyperglycemic despite continued sliding scale insulin  dose and reduction of D5 half-normal saline.  Stop D5 half-normal saline Recheck labs at midnight to ensure correction of hypernatremia Increased sliding scale insulin  dose to resistant/obese q4 Remain NPO due to somnolence     Laneta Gardener- Garmon BSN RN CCRN AGACNP-BC Acute Care Nurse Practitioner [facility] [place]  "

## 2024-04-15 NOTE — Plan of Care (Signed)
" °  Problem: Clinical Measurements: Goal: Ability to maintain clinical measurements within normal limits will improve Outcome: Progressing Goal: Will remain free from infection Outcome: Progressing Goal: Diagnostic test results will improve Outcome: Progressing Goal: Respiratory complications will improve Outcome: Progressing Goal: Cardiovascular complication will be avoided Outcome: Progressing   Problem: Nutrition: Goal: Adequate nutrition will be maintained Outcome: Not Progressing   Problem: Elimination: Goal: Will not experience complications related to bowel motility Outcome: Progressing Goal: Will not experience complications related to urinary retention Outcome: Progressing   Problem: Pain Managment: Goal: General experience of comfort will improve and/or be controlled Outcome: Progressing   Problem: Metabolic: Goal: Ability to maintain appropriate glucose levels will improve Outcome: Progressing   "

## 2024-04-16 ENCOUNTER — Inpatient Hospital Stay
Admit: 2024-04-16 | Discharge: 2024-04-16 | Disposition: A | Discharge status: Home / Self Care | Attending: Osteopathic Medicine | Admitting: Osteopathic Medicine

## 2024-04-16 DIAGNOSIS — E1110 Type 2 diabetes mellitus with ketoacidosis without coma: Secondary | ICD-10-CM | POA: Diagnosis not present

## 2024-04-16 LAB — BASIC METABOLIC PANEL WITH GFR
Anion gap: 9 (ref 5–15)
BUN: 13 mg/dL (ref 6–20)
CO2: 27 mmol/L (ref 22–32)
Calcium: 8.7 mg/dL — ABNORMAL LOW (ref 8.9–10.3)
Chloride: 108 mmol/L (ref 98–111)
Creatinine, Ser: 1.04 mg/dL — ABNORMAL HIGH (ref 0.44–1.00)
GFR, Estimated: >60 mL/min
Glucose, Bld: 241 mg/dL — ABNORMAL HIGH (ref 70–99)
Potassium: 4.1 mmol/L (ref 3.5–5.1)
Sodium: 143 mmol/L (ref 135–145)

## 2024-04-16 LAB — CBC
HCT: 27.9 % — ABNORMAL LOW (ref 36.0–46.0)
Hemoglobin: 9.3 g/dL — ABNORMAL LOW (ref 12.0–15.0)
MCH: 31.8 pg (ref 26.0–34.0)
MCHC: 33.3 g/dL (ref 30.0–36.0)
MCV: 95.5 fL (ref 80.0–100.0)
Platelets: 560 K/uL — ABNORMAL HIGH (ref 150–400)
RBC: 2.92 MIL/uL — ABNORMAL LOW (ref 3.87–5.11)
RDW: 15.8 % — ABNORMAL HIGH (ref 11.5–15.5)
WBC: 14.1 K/uL — ABNORMAL HIGH (ref 4.0–10.5)
nRBC: 0.2 % (ref 0.0–0.2)

## 2024-04-16 LAB — ECHOCARDIOGRAM COMPLETE
AR max vel: 2.01 cm2
AV Area VTI: 1.78 cm2
AV Area mean vel: 1.9 cm2
AV Mean grad: 5.5 mmHg
AV Peak grad: 9.7 mmHg
Ao pk vel: 1.56 m/s
Area-P 1/2: 4.31 cm2
Calc EF: 60 %
Height: 65 in
S' Lateral: 2.9 cm
Single Plane A2C EF: 51.9 %
Single Plane A4C EF: 66.5 %
Weight: 2560.86 [oz_av]

## 2024-04-16 LAB — HEMOGLOBIN A1C
Hgb A1c MFr Bld: 16.8 % — ABNORMAL HIGH (ref 4.8–5.6)
Mean Plasma Glucose: 435.46 mg/dL

## 2024-04-16 LAB — URINE CULTURE: Culture: >=100000 — AB

## 2024-04-16 LAB — GLUCOSE, CAPILLARY
Glucose-Capillary: 241 mg/dL — ABNORMAL HIGH (ref 70–99)
Glucose-Capillary: 223 mg/dL — ABNORMAL HIGH (ref 70–99)
Glucose-Capillary: 206 mg/dL — ABNORMAL HIGH (ref 70–99)
Glucose-Capillary: 275 mg/dL — ABNORMAL HIGH (ref 70–99)
Glucose-Capillary: 280 mg/dL — ABNORMAL HIGH (ref 70–99)

## 2024-04-16 MED ORDER — DULOXETINE HCL 30 MG PO CPEP
60.0000 mg | ORAL_CAPSULE | Freq: Every day | ORAL | Status: DC
  Administered 2024-04-16 – 2024-04-20 (×5): 60 mg via ORAL
  Filled 2024-04-16 (×2): qty 2

## 2024-04-16 MED ORDER — INSULIN ASPART 100 UNIT/ML IJ SOLN
5.0000 [IU] | Freq: Three times a day (TID) | INTRAMUSCULAR | Status: DC
  Administered 2024-04-16 – 2024-04-20 (×12): 5 [IU] via SUBCUTANEOUS
  Filled 2024-04-16 (×4): qty 5

## 2024-04-16 MED ORDER — INSULIN GLARGINE 100 UNIT/ML ~~LOC~~ SOLN
15.0000 [IU] | Freq: Every day | SUBCUTANEOUS | Status: DC
  Administered 2024-04-16: 15 [IU] via SUBCUTANEOUS
  Filled 2024-04-16: qty 0.15

## 2024-04-16 MED ORDER — PANTOPRAZOLE SODIUM 40 MG PO TBEC
40.0000 mg | DELAYED_RELEASE_TABLET | Freq: Every day | ORAL | Status: DC
  Administered 2024-04-17 – 2024-04-21 (×5): 40 mg via ORAL
  Filled 2024-04-16: qty 1

## 2024-04-16 MED ORDER — INSULIN GLARGINE 100 UNIT/ML ~~LOC~~ SOLN
20.0000 [IU] | Freq: Every day | SUBCUTANEOUS | Status: DC
  Administered 2024-04-17: 20 [IU] via SUBCUTANEOUS
  Filled 2024-04-16: qty 0.2

## 2024-04-16 MED ORDER — PANTOPRAZOLE SODIUM 40 MG IV SOLR
40.0000 mg | Freq: Every day | INTRAVENOUS | Status: DC
  Administered 2024-04-16: 40 mg via INTRAVENOUS
  Filled 2024-04-16: qty 10

## 2024-04-16 MED ORDER — NAPROXEN 250 MG PO TABS
250.0000 mg | ORAL_TABLET | Freq: Three times a day (TID) | ORAL | Status: DC
  Administered 2024-04-16 – 2024-04-21 (×16): 250 mg via ORAL
  Filled 2024-04-16 (×5): qty 1

## 2024-04-16 MED ORDER — GABAPENTIN 300 MG PO CAPS
300.0000 mg | ORAL_CAPSULE | Freq: Three times a day (TID) | ORAL | Status: DC
  Administered 2024-04-16 – 2024-04-21 (×14): 300 mg via ORAL
  Filled 2024-04-16 (×4): qty 1

## 2024-04-16 MED ORDER — TRAMADOL HCL 50 MG PO TABS
50.0000 mg | ORAL_TABLET | Freq: Four times a day (QID) | ORAL | Status: DC | PRN
  Administered 2024-04-18 – 2024-04-20 (×3): 50 mg via ORAL

## 2024-04-16 MED ORDER — INSULIN ASPART 100 UNIT/ML IJ SOLN
0.0000 [IU] | Freq: Three times a day (TID) | INTRAMUSCULAR | Status: DC
  Administered 2024-04-16: 5 [IU] via SUBCUTANEOUS
  Administered 2024-04-17: 2 [IU] via SUBCUTANEOUS
  Administered 2024-04-17 – 2024-04-18 (×4): 3 [IU] via SUBCUTANEOUS
  Administered 2024-04-18: 5 [IU] via SUBCUTANEOUS
  Administered 2024-04-19 (×3): 3 [IU] via SUBCUTANEOUS
  Administered 2024-04-20: 5 [IU] via SUBCUTANEOUS
  Administered 2024-04-20: 7 [IU] via SUBCUTANEOUS
  Administered 2024-04-20: 3 [IU] via SUBCUTANEOUS
  Administered 2024-04-21 (×2): 7 [IU] via SUBCUTANEOUS
  Administered 2024-04-21: 5 [IU] via SUBCUTANEOUS
  Filled 2024-04-16: qty 3
  Filled 2024-04-16: qty 5
  Filled 2024-04-16: qty 2
  Filled 2024-04-16: qty 3

## 2024-04-16 NOTE — Progress Notes (Signed)
" °   04/16/24 1300  Spiritual Encounters  Type of Visit Initial  Care provided to: Pt and family  Referral source Patient request  Reason for visit Advance directives  OnCall Visit Yes   Chaplain provided and explained Advance Directives paperwork to patient and daughter.  "

## 2024-04-16 NOTE — Progress Notes (Signed)
 " PROGRESS NOTE    CATIA TODOROV   FMW:[id number] DOB: 1965-09-27  DOA: 04/14/2024 Date of Service: 04/16/2024 which is hospital day 2  PCP: Health, [facility] course / significant events:   HPI: Shaneca EVAMARIE RAETZ is a 58 y.o. year old female with medical history of hypertension, hyperlipidemia, type 2 diabetes presenting to the ED with confusion and somnolence.  Patient was brought here by her mother who states patient has not had significant p.o. intake in the last 2 days.    12/26: On arrival to the ED patient was noted to be HDS stable.  Lab work and imaging obtained.  CBC with leukocytosis at 16.7, mild anemia at 11.1 significant thrombocytosis at 806.  CMP with severe hyperglycemia at 957 with significant anion gap metabolic acidosis with bicarb at 10 and AKI and hypercalcium which are likely mediated by dehydration.  Magnesium  was checked and 2.7.  Serum osmolality elevated at 373, BHB greater than 8.  VBG shows severe acidemia at 7.1 4/32/43.  TSH checked and less than 0.100 likely in setting of acute illness.  UA shows nitrite positive and bacteria.  Urine culture pending.  Ammonia level normal.  Patient's position consistent with DKA so DKA protocol initiated.  Given presentation, [facility] contacted for admission. DKA protocol in stepdown unit 12/27: DKA resolved pt still somnolent though she is following commands, c/o shoulder pain, not answering other questions. Known humerus fx - confirmed w/ ortho on call Dr Lorelle to review XR - shows some displacement but nothing to do operatively at this time. MRI brain given persistent encephalopathy, question TBI / CVA. Echocardiogram ordered to eval possible syncope cause for falling but suspect DKA had been developing for some time and causing weakness/fall.  12/28: much more alert today, conversational, oriented. Cannot recall home dose insulin  or other meds, not sure if she was taking insulin  per instructions.  Adjusting insulin  as needed. 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 Daughter request discuss w/ ortho re humerus fracture, will try to reach out tomorrow to primary orthopedist      Consultants:   none  Procedures/Surgeries: none      ASSESSMENT & PLAN:   DKA - resolved AGMA - resolved  Secondary to insulin  non-adherence  insulin  drip --> subcu and doing well ,adjust as needed Low dose basal and low dose SSI increased now that not NPO   Acute metabolic encephalopathy - resolved  monitor  Multiple falls at home Likely weakness/illness related to DKA Possible other syncope etiology Question TBI / concussion  Echo no concrns Telemetry can DC  AKI - resolved/improved w/ hydration Monitor BMP  UTI Nitrite(+) Pending culture will treat presumptively as might be contributing to encephalopathy/fall  Rocephin    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  Needs outpatient GI follow up.   Humerus fracture Maintain sling.  outpatient ortho follow up  Pain control    Hypertension:  on lisinopril  and HCT outpatient.  Will hold given AKI. IV antihypertensive prn    Hyperlipidemia Resume statin when taking po    Fibromyalgia Chronic pain  Resume home SNRI, tramadol    GERD  PPI IV while NPO    overweight based on BMI: Body mass index is 27.1 kg/m.SABRA Significantly low or high BMI is associated with higher medical risk.  Underweight - under 18  overweight - 25 to 29 obese - 30 or more Class 1 obesity: BMI of 30.0 to 34 Class 2 obesity: BMI  of 35.0 to 39 Class 3 obesity: BMI of 40.0 to 49 Super Morbid Obesity: BMI 50-59 Super-super Morbid Obesity: BMI 60+ Healthy nutrition and physical activity advised as adjunct to other disease management and risk reduction treatments    DVT prophylaxis: heparin  w/ AKI IV fluids: D5 0.5NS continuous IV fluids  Nutrition: npo Central lines / other devices: none  Code Status: FULL  CODE ACP documentation reviewed:  none on file in [facility]  TOC needs: TBD Medical barriers to dispo: encephalopathy, resolving DKA. Expected medical readiness for discharge few days.              Subjective / Brief ROS:  Patient alert today and conversing ok Unsure home insulin  or other emds dosing Confirms frequent falls   Family Communication: daughter at bedside on rounds     Objective Findings:  Vitals:   04/16/24 1200 04/16/24 1300 04/16/24 1350 04/16/24 1620  BP: 106/73 107/67 110/69 (!) 117/55  Pulse: 79 77 82 74  Resp: 16 19  18   Temp: 98.2 F (36.8 C)  98.4 F (36.9 C) 98.9 F (37.2 C)  TempSrc: Oral  Oral   SpO2: 97% 94% 95% 96%  Weight:      Height:        Intake/Output Summary (Last 24 hours) at 04/16/2024 1736 Last data filed at 04/16/2024 1200 Gross per 24 hour  Intake 1076.47 ml  Output 1480 ml  Net -403.53 ml   Filed Weights   04/14/24 1830 04/16/24 0345  Weight: 69.4 kg 72.6 kg    Examination:  Physical Exam Constitutional:      General: She is not in acute distress.    Appearance: She is not ill-appearing.  Cardiovascular:     Rate and Rhythm: Normal rate and regular rhythm.  Pulmonary:     Effort: Pulmonary effort is normal. No respiratory distress.     Breath sounds: No wheezing.  Abdominal:     General: Abdomen is flat. Bowel sounds are normal.  Musculoskeletal:     Right lower leg: No edema.     Left lower leg: No edema.  Neurological:     Mental Status: She is alert and oriented to person, place, and time.          Scheduled Medications:   Chlorhexidine  Gluconate Cloth  6 each Topical Q0600   DULoxetine   60 mg Oral QHS   gabapentin   300 mg Oral TID   heparin   5,000 Units Subcutaneous Q8H   insulin  aspart  0-9 Units Subcutaneous TID WC   insulin  glargine  15 Units Subcutaneous Daily   naproxen   250 mg Oral TID WC   [START ON 04/17/2024] pantoprazole   40 mg Oral Daily   sodium chloride  flush  3 mL Intravenous  Q12H    Continuous Infusions:  cefTRIAXone  (ROCEPHIN )  IV Stopped (04/15/24 1702)    PRN Medications:  dextrose , ondansetron  **OR** ondansetron  (ZOFRAN ) IV, mouth rinse, senna-docusate, traMADol   Antimicrobials from admission:  Anti-infectives (From admission, onward)    Start     Dose/Rate Route Frequency Ordered Stop   04/15/24 1600  cefTRIAXone  (ROCEPHIN ) 1 g in sodium chloride  0.9 % 100 mL IVPB        1 g 200 mL/hr over 30 Minutes Intravenous Every 24 hours 04/15/24 1509             Data Reviewed:  I have personally reviewed the following...  CBC: Recent Labs  Lab 04/14/24 1311 04/15/24 0318 04/16/24 0057  WBC 16.7* 19.5* 14.1*  NEUTROABS 14.7*  --   --   HGB 11.1* 9.8* 9.3*  HCT 34.6* 28.9* 27.9*  MCV 98.9 92.0 95.5  PLT 806* 686* 560*   Basic Metabolic Panel: Recent Labs  Lab 04/14/24 1311 04/14/24 1616 04/14/24 1956 04/14/24 2208 04/15/24 0318 04/15/24 0632 04/15/24 1548 04/16/24 0057  NA 140   < > 147* 151* 146* 145 147* 143  K 4.5   < > 3.2* 3.0* 3.7 4.0 3.6 4.1  CL 100   < > 111 113* 111 110 112* 108  CO2 10*   < > 21* 24 27 29 27 27   GLUCOSE 957*   < > 467* 283* 215* 218* 95 241*  BUN 27*   < > 23* 21* 18 17 14 13   CREATININE 1.60*   < > 1.26* 1.14* 1.07* 1.10* 1.01* 1.04*  CALCIUM  10.4*   < > 9.9 10.0 9.5 9.1 8.7* 8.7*  MG 2.7*  --  2.3  --   --   --   --   --   PHOS  --   --   --  <1.0*  --  2.6  --   --    < > = values in this interval not displayed.   GFR: Estimated Creatinine Clearance: 58.1 mL/min (A) (by C-G formula based on SCr of 1.04 mg/dL (H)). Liver Function Tests: Recent Labs  Lab 04/14/24 1311  AST <10*  ALT 12  ALKPHOS 208*  BILITOT 0.3  PROT 7.0  ALBUMIN  3.6   No results for input(s): LIPASE, AMYLASE in the last 168 hours. Recent Labs  Lab 04/14/24 1311  AMMONIA <13   Coagulation Profile: No results for input(s): INR, PROTIME in the last 168 hours. Cardiac Enzymes: Recent Labs  Lab 04/14/24 1311   CKTOTAL 69   BNP (last 3 results) No results for input(s): PROBNP in the last 8760 hours. HbA1C: Recent Labs    04/15/24 1811  HGBA1C 16.8*   CBG: Recent Labs  Lab 04/15/24 2306 04/16/24 0339 04/16/24 0806 04/16/24 1155 04/16/24 1621  GLUCAP 239* 241* 223* 206* 275*   Lipid Profile: No results for input(s): CHOL, HDL, LDLCALC, TRIG, CHOLHDL, LDLDIRECT in the last 72 hours. Thyroid  Function Tests: Recent Labs    04/14/24 1311  TSH <0.100*  FREET4 1.02   Anemia Panel: No results for input(s): VITAMINB12, FOLATE, FERRITIN, TIBC, IRON, RETICCTPCT in the last 72 hours. Most Recent Urinalysis On File:     Component Value Date/Time   COLORURINE STRAW (A) 04/14/2024 1311   APPEARANCEUR CLEAR (A) 04/14/2024 1311   LABSPEC 1.027 04/14/2024 1311   PHURINE 5.0 04/14/2024 1311   GLUCOSEU >=500 (A) 04/14/2024 1311   HGBUR NEGATIVE 04/14/2024 1311   BILIRUBINUR NEGATIVE 04/14/2024 1311   KETONESUR 20 (A) 04/14/2024 1311   PROTEINUR NEGATIVE 04/14/2024 1311   UROBILINOGEN 0.2 01/19/2012 2019   NITRITE POSITIVE (A) 04/14/2024 1311   LEUKOCYTESUR NEGATIVE 04/14/2024 1311   Sepsis Labs: @LABRCNTIP (procalcitonin:4,lacticidven:4) Microbiology: Recent Results (from the past 240 hours)  Resp panel by RT-PCR (RSV, Flu A&B, Covid) Anterior Nasal Swab     Status: None   Collection Time: 04/14/24  1:11 PM   Specimen: Anterior Nasal Swab  Result Value Ref Range Status   SARS Coronavirus 2 by RT PCR NEGATIVE NEGATIVE Final    Comment: (NOTE) SARS-CoV-2 target nucleic acids are NOT DETECTED.  The SARS-CoV-2 RNA is generally detectable in upper respiratory specimens during the acute phase of infection. The lowest concentration of SARS-CoV-2 viral copies this  assay can detect is 138 copies/mL. A negative result does not preclude SARS-Cov-2 infection and should not be used as the sole basis for treatment or other patient management decisions. A negative  result may occur with  improper specimen collection/handling, submission of specimen other than nasopharyngeal swab, presence of viral mutation(s) within the areas targeted by this assay, and inadequate number of viral copies(<138 copies/mL). A negative result must be combined with clinical observations, patient history, and epidemiological information. The expected result is Negative.  Fact Sheet for Patients:  bloggercourse.com  Fact Sheet for Healthcare Providers:  seriousbroker.it  This test is no t yet approved or cleared by the [place]  FDA and  has been authorized for detection and/or diagnosis of SARS-CoV-2 by FDA under an Emergency Use Authorization (EUA). This EUA will remain  in effect (meaning this test can be used) for the duration of the COVID-19 declaration under Section 564(b)(1) of the Act, 21 U.S.C.section 360bbb-3(b)(1), unless the authorization is terminated  or revoked sooner.       Influenza A by PCR NEGATIVE NEGATIVE Final   Influenza B by PCR NEGATIVE NEGATIVE Final    Comment: (NOTE) The Xpert Xpress SARS-CoV-2/FLU/RSV plus assay is intended as an aid in the diagnosis of influenza from Nasopharyngeal swab specimens and should not be used as a sole basis for treatment. Nasal washings and aspirates are unacceptable for Xpert Xpress SARS-CoV-2/FLU/RSV testing.  Fact Sheet for Patients: bloggercourse.com  Fact Sheet for Healthcare Providers: seriousbroker.it  This test is not yet approved or cleared by the [place]  FDA and has been authorized for detection and/or diagnosis of SARS-CoV-2 by FDA under an Emergency Use Authorization (EUA). This EUA will remain in effect (meaning this test can be used) for the duration of the COVID-19 declaration under Section 564(b)(1) of the Act, 21 U.S.C. section 360bbb-3(b)(1), unless the authorization is  terminated or revoked.     Resp Syncytial Virus by PCR NEGATIVE NEGATIVE Final    Comment: (NOTE) Fact Sheet for Patients: bloggercourse.com  Fact Sheet for Healthcare Providers: seriousbroker.it  This test is not yet approved or cleared by the [place]  FDA and has been authorized for detection and/or diagnosis of SARS-CoV-2 by FDA under an Emergency Use Authorization (EUA). This EUA will remain in effect (meaning this test can be used) for the duration of the COVID-19 declaration under Section 564(b)(1) of the Act, 21 U.S.C. section 360bbb-3(b)(1), unless the authorization is terminated or revoked.  Performed at [facility], [address], [place], [place] [address]   Urine Culture (for pregnant, neutropenic or urologic patients or patients with an indwelling urinary catheter)     Status: Abnormal   Collection Time: 04/14/24  1:11 PM   Specimen: Urine, Clean Catch  Result Value Ref Range Status   Specimen Description   Final    URINE, CLEAN CATCH Performed at [facility], [address] [place], [place] [address]    Special Requests   Final    NONE Performed at [facility], [address], [place], [place] [address]    Culture >=100,000 COLONIES/mL KLEBSIELLA PNEUMONIAE (A)  Final   Report Status 04/16/2024 FINAL  Final   Organism ID, Bacteria KLEBSIELLA PNEUMONIAE (A)  Final      Susceptibility   Klebsiella pneumoniae - MIC*    AMPICILLIN RESISTANT Resistant     CEFAZOLIN  (URINE) Value in next row Sensitive      <=1 SENSITIVEThis is a modified FDA-approved test that has been validated and  its performance characteristics determined by the reporting laboratory.  This laboratory is certified under the Clinical Laboratory Improvement Amendments CLIA as qualified to perform high complexity clinical laboratory testing.    CEFEPIME Value in next row Sensitive      <=1 SENSITIVEThis is a  modified FDA-approved test that has been validated and its performance characteristics determined by the reporting laboratory.  This laboratory is certified under the Clinical Laboratory Improvement Amendments CLIA as qualified to perform high complexity clinical laboratory testing.    ERTAPENEM Value in next row Sensitive      <=1 SENSITIVEThis is a modified FDA-approved test that has been validated and its performance characteristics determined by the reporting laboratory.  This laboratory is certified under the Clinical Laboratory Improvement Amendments CLIA as qualified to perform high complexity clinical laboratory testing.    CEFTRIAXONE  Value in next row Sensitive      <=1 SENSITIVEThis is a modified FDA-approved test that has been validated and its performance characteristics determined by the reporting laboratory.  This laboratory is certified under the Clinical Laboratory Improvement Amendments CLIA as qualified to perform high complexity clinical laboratory testing.    CIPROFLOXACIN  Value in next row Sensitive      <=1 SENSITIVEThis is a modified FDA-approved test that has been validated and its performance characteristics determined by the reporting laboratory.  This laboratory is certified under the Clinical Laboratory Improvement Amendments CLIA as qualified to perform high complexity clinical laboratory testing.    GENTAMICIN Value in next row Sensitive      <=1 SENSITIVEThis is a modified FDA-approved test that has been validated and its performance characteristics determined by the reporting laboratory.  This laboratory is certified under the Clinical Laboratory Improvement Amendments CLIA as qualified to perform high complexity clinical laboratory testing.    NITROFURANTOIN Value in next row Resistant      <=1 SENSITIVEThis is a modified FDA-approved test that has been validated and its performance characteristics determined by the reporting laboratory.  This laboratory is certified under  the Clinical Laboratory Improvement Amendments CLIA as qualified to perform high complexity clinical laboratory testing.    TRIMETH/SULFA Value in next row Sensitive      <=1 SENSITIVEThis is a modified FDA-approved test that has been validated and its performance characteristics determined by the reporting laboratory.  This laboratory is certified under the Clinical Laboratory Improvement Amendments CLIA as qualified to perform high complexity clinical laboratory testing.    AMPICILLIN/SULBACTAM Value in next row Sensitive      <=1 SENSITIVEThis is a modified FDA-approved test that has been validated and its performance characteristics determined by the reporting laboratory.  This laboratory is certified under the Clinical Laboratory Improvement Amendments CLIA as qualified to perform high complexity clinical laboratory testing.    PIP/TAZO Value in next row Sensitive      <=4 SENSITIVEThis is a modified FDA-approved test that has been validated and its performance characteristics determined by the reporting laboratory.  This laboratory is certified under the Clinical Laboratory Improvement Amendments CLIA as qualified to perform high complexity clinical laboratory testing.    MEROPENEM  Value in next row Sensitive      <=4 SENSITIVEThis is a modified FDA-approved test that has been validated and its performance characteristics determined by the reporting laboratory.  This laboratory is certified under the Clinical Laboratory Improvement Amendments CLIA as qualified to perform high complexity clinical laboratory testing.    * >=100,000 COLONIES/mL KLEBSIELLA PNEUMONIAE  MRSA Next Gen by PCR, Nasal  Status: None   Collection Time: 04/14/24  6:32 PM   Specimen: Nasal Mucosa; Nasal Swab  Result Value Ref Range Status   MRSA by PCR Next Gen NOT DETECTED NOT DETECTED Final    Comment: (NOTE) The GeneXpert MRSA Assay (FDA approved for NASAL specimens only), is one component of a comprehensive MRSA  colonization surveillance program. It is not intended to diagnose MRSA infection nor to guide or monitor treatment for MRSA infections. Test performance is not FDA approved in patients less than 66 years old. Performed at [facility], [address], [place], [place] [address]       Radiology Studies last 3 days: ECHOCARDIOGRAM COMPLETE Result Date: 04/16/2024    ECHOCARDIOGRAM REPORT   Patient Name:   KAINAT PIZANA Date of Exam: 04/16/2024 Medical Rec #:  [id number]       Height:       65.0 in Accession #:    [id number]      Weight:       160.1 lb Date of Birth:  01/09/1966       BSA:          1.799 m Patient Age:    58 years        BP:           110/45 mmHg Patient Gender: F               HR:           71 bpm. Exam Location:  [facility] Procedure: 2D Echo, Color Doppler and Cardiac Doppler (Both Spectral and Color            Flow Doppler were utilized during procedure). Indications:     R55 Syncope  History:         Patient has prior history of Echocardiogram examinations.                  Previous Myocardial Infarction; Risk Factors:Hypertension,                  Diabetes and Dyslipidemia.  Sonographer:     L. Thornton-Maynard, RDCS Referring Phys:  [id number] LANETA BLUNT Diagnosing Phys: Cara JONETTA Lovelace MD  Sonographer Comments: Global longitudinal strain was attempted. IMPRESSIONS  1. Left ventricular ejection fraction, by estimation, is 60 to 65%. The left ventricle has normal function. The left ventricle has no regional wall motion abnormalities. Left ventricular diastolic parameters are consistent with Grade I diastolic dysfunction (impaired relaxation). The average left ventricular global longitudinal strain is 18.0 %. The global longitudinal strain is normal.  2. Right ventricular systolic function is normal. The right ventricular size is normal. There is normal pulmonary artery systolic pressure.  3. The mitral valve is normal in structure. Trivial mitral valve regurgitation.  4.  The aortic valve is normal in structure. Aortic valve regurgitation is not visualized. FINDINGS  Left Ventricle: Left ventricular ejection fraction, by estimation, is 60 to 65%. The left ventricle has normal function. The left ventricle has no regional wall motion abnormalities. The average left ventricular global longitudinal strain is 18.0 %. Strain was performed and the global longitudinal strain is normal. The left ventricular internal cavity size was normal in size. There is borderline left ventricular hypertrophy. Left ventricular diastolic parameters are consistent with Grade I diastolic  dysfunction (impaired relaxation). Right Ventricle: The right ventricular size is normal. No increase in right ventricular wall thickness. Right ventricular systolic function is normal. There is normal pulmonary artery systolic pressure. The  tricuspid regurgitant velocity is 1.67 m/s, and  with an assumed right atrial pressure of 3 mmHg, the estimated right ventricular systolic pressure is 14.2 mmHg. Left Atrium: Left atrial size was normal in size. Right Atrium: Right atrial size was normal in size. Pericardium: There is no evidence of pericardial effusion. Mitral Valve: The mitral valve is normal in structure. Trivial mitral valve regurgitation. Tricuspid Valve: The tricuspid valve is normal in structure. Tricuspid valve regurgitation is trivial. Aortic Valve: The aortic valve is normal in structure. Aortic valve regurgitation is not visualized. Aortic valve mean gradient measures 5.5 mmHg. Aortic valve peak gradient measures 9.7 mmHg. Aortic valve area, by VTI measures 1.78 cm. Pulmonic Valve: The pulmonic valve was not well visualized. Pulmonic valve regurgitation is not visualized. Aorta: The ascending aorta was not well visualized. IAS/Shunts: No atrial level shunt detected by color flow Doppler. Additional Comments: 3D was performed not requiring image post processing on an independent workstation and was  indeterminate.  LEFT VENTRICLE PLAX 2D LVIDd:         4.50 cm     Diastology LVIDs:         2.90 cm     LV e' medial:    7.94 cm/s LV PW:         1.00 cm     LV E/e' medial:  10.1 LV IVS:        1.00 cm     LV e' lateral:   11.20 cm/s LVOT diam:     2.00 cm     LV E/e' lateral: 7.2 LV SV:         54 LV SV Index:   30          2D Longitudinal Strain LVOT Area:     3.14 cm    2D Strain GLS (A4C):   19.9 % LV IVRT:       85 msec     2D Strain GLS (A3C):   15.4 %                            2D Strain GLS (A2C):   18.6 %                            2D Strain GLS Avg:     18.0 % LV Volumes (MOD) LV vol d, MOD A2C: 52.4 ml LV vol d, MOD A4C: 81.0 ml LV vol s, MOD A2C: 25.2 ml LV vol s, MOD A4C: 27.1 ml LV SV MOD A2C:     27.2 ml LV SV MOD A4C:     81.0 ml LV SV MOD BP:      39.9 ml RIGHT VENTRICLE RV Basal diam:  2.40 cm RV Mid diam:    2.10 cm RV S prime:     10.00 cm/s TAPSE (M-mode): 1.6 cm LEFT ATRIUM             Index        RIGHT ATRIUM          Index LA diam:        2.50 cm 1.39 cm/m   RA Area:     9.56 cm LA Vol (A2C):   48.1 ml 26.73 ml/m  RA Volume:   18.20 ml 10.11 ml/m LA Vol (A4C):   26.1 ml 14.50 ml/m LA Biplane Vol: 35.9 ml 19.95 ml/m  AORTIC VALVE  PULMONIC VALVE AV Area (Vmax):    2.01 cm      PV Vmax:       0.94 m/s AV Area (Vmean):   1.90 cm      PV Peak grad:  3.5 mmHg AV Area (VTI):     1.78 cm AV Vmax:           156.00 cm/s AV Vmean:          106.500 cm/s AV VTI:            0.302 m AV Peak Grad:      9.7 mmHg AV Mean Grad:      5.5 mmHg LVOT Vmax:         100.00 cm/s LVOT Vmean:        64.500 cm/s LVOT VTI:          0.171 m LVOT/AV VTI ratio: 0.57  AORTA Ao Root diam: 3.10 cm Ao Asc diam:  3.10 cm MITRAL VALVE               TRICUSPID VALVE MV Area (PHT): 4.31 cm    TR Peak grad:   11.2 mmHg MV Decel Time: 176 msec    TR Vmax:        167.00 cm/s MV E velocity: 80.20 cm/s MV A velocity: 84.90 cm/s  SHUNTS MV E/A ratio:  0.94        Systemic VTI:  0.17 m                             Systemic Diam: 2.00 cm Cara JONETTA Lovelace MD Electronically signed by Cara JONETTA Lovelace MD Signature Date/Time: 04/16/2024/11:15:53 AM    Final    MR BRAIN WO CONTRAST Result Date: 04/15/2024 EXAM: MRI BRAIN WITHOUT CONTRAST 04/15/2024 02:43:23 PM TECHNIQUE: Multiplanar multisequence MRI of the head/brain was performed without the administration of intravenous contrast. COMPARISON: CT head 04/14/2024 and MRI head 10/24/2021. CLINICAL HISTORY: Neuro deficit, acute, stroke suspected. FINDINGS: BRAIN AND VENTRICLES: No acute infarct. No intracranial hemorrhage. No mass. No midline shift. No hydrocephalus. Mild areas of T2 and FLAIR hyperintensity in the periventricular white matter which are slightly increased since the 2023 MRI suggestive of mild chronic microvascular ischemic changes. 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 The sella is unremarkable. Normal flow voids. ORBITS: No acute abnormality. SINUSES AND MASTOIDS: Trace fluid in the mastoid air cells. BONES AND SOFT TISSUES: Normal marrow signal. No acute soft tissue abnormality. IMPRESSION: 1. No acute intracranial abnormality. 2. 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 3. Mild chronic microvascular ischemic changes, slightly increased since the 2023 MRI. Electronically signed by: Donnice Mania MD 04/15/2024 03:43 PM EST RP Workstation: [id number]   DG Humerus Left Result Date: 04/15/2024 CLINICAL DATA:  Left humeral fracture. EXAM: LEFT HUMERUS - 2+ VIEW COMPARISON:  Radiograph 03/30/2024 FINDINGS: Left proximal humerus fracture demonstrates increased displacement from prior exam. Osseous overriding of 7-8 mm. There is progressive apex lateral angulation. No internal or peripheral callus formation. No evidence of glenohumeral dislocation. The distal humerus is intact. Mild soft tissue edema persists. IMPRESSION:  Increased displacement and angulation of left proximal humerus fracture. No internal or peripheral callus formation. Electronically Signed   By: Andrea Gasman M.D.   On: 04/15/2024 13:34   CT ABDOMEN PELVIS WO CONTRAST Result Date: 04/14/2024 CLINICAL DATA:  Acute  generalized abdominal pain EXAM: CT ABDOMEN AND PELVIS WITHOUT CONTRAST TECHNIQUE: Multidetector CT imaging of the abdomen and pelvis was performed following the standard protocol without IV contrast. RADIATION DOSE REDUCTION: This exam was performed according to the departmental dose-optimization program which includes automated exposure control, adjustment of the mA and/or kV according to patient size and/or use of iterative reconstruction technique. COMPARISON:  November 10, 2021 FINDINGS: Lower chest: No acute abnormality. Hepatobiliary: No focal liver abnormality is seen. No gallstones, gallbladder wall thickening, or biliary dilatation. 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 No pancreatic ductal dilatation or surrounding inflammatory changes. Spleen: Status post splenectomy. Adrenals/Urinary Tract: Adrenal glands are unremarkable. Mild right renal atrophy is noted. No hydronephrosis or renal obstruction is noted. Bladder is unremarkable. Stomach/Bowel: Stomach is within normal limits. Appendix appears normal. No evidence of bowel wall thickening, distention, or inflammatory changes. Vascular/Lymphatic: Aortic atherosclerosis. No enlarged abdominal or pelvic lymph nodes. Reproductive: Status post hysterectomy. No adnexal masses. Other: No abdominal wall hernia or abnormality. No abdominopelvic ascites. Musculoskeletal: No acute or significant osseous findings. IMPRESSION: 1. 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 2. Mild right renal atrophy. 3. No acute abnormality seen in the abdomen or pelvis. 4. Aortic atherosclerosis. Aortic Atherosclerosis  (ICD10-I70.0). Electronically Signed   By: Lynwood Landy Raddle M.D.   On: 04/14/2024 14:13   DG Chest Portable 1 View Result Date: 04/14/2024 CLINICAL DATA:  Altered mental status EXAM: PORTABLE CHEST 1 VIEW COMPARISON:  October 09, 2021 FINDINGS: The heart size and mediastinal contours are within normal limits. Both lungs are clear. Moderately displaced and comminuted fracture is seen involving the proximal left humerus. IMPRESSION: No active cardiopulmonary disease. Moderately displaced and comminuted proximal left humeral fracture. Electronically Signed   By: Lynwood Landy Raddle M.D.   On: 04/14/2024 14:06   CT HEAD WO CONTRAST ( ) Result Date: 04/14/2024 EXAM: CT HEAD WITHOUT CONTRAST 04/14/2024 01:40:08 PM TECHNIQUE: CT of the head was performed without the administration of intravenous contrast. Automated exposure control, iterative reconstruction, and/or weight based adjustment of the mA/kV was utilized to reduce the radiation dose to as low as reasonably achievable. COMPARISON: CT head 03/29/2014. MRI brain 10/24/2021. CLINICAL HISTORY: Head trauma, abnormal mental status (Age 46-64y). FINDINGS: BRAIN AND VENTRICLES: No acute hemorrhage. No evidence of acute infarct. No hydrocephalus. No extra-axial collection. No mass effect or midline shift. ORBITS: No acute abnormality. SINUSES: No acute abnormality. SOFT TISSUES AND SKULL: No acute soft tissue abnormality. No skull fracture. IMPRESSION: 1. No acute intracranial abnormality. Electronically signed by: Ryan Chess MD 04/14/2024 01:45 PM EST RP Workstation: [id number]       Time spent: 50 min    Laneta Blunt, DO [facility] 04/16/2024, 5:36 PM    Dictation software may have been used to generate the above note. Typos may occur and escape review in typed/dictated notes. Please contact Dr Blunt directly for clarity if needed.  Staff may message me via secure chat in Epic  but this may not receive an immediate response,   please page me for urgent matters!  If 7PM-7AM, please contact night coverage [email or web address]       "

## 2024-04-16 NOTE — Progress Notes (Signed)
 Pt to be transferred to  224 per MD order. Report called to [facility], all questions answered.daughter at bedside and aware.All belongings sent with pt.

## 2024-04-16 NOTE — Progress Notes (Signed)
 Per MD okay to remove foley. Pt tolerated well.

## 2024-04-16 NOTE — Plan of Care (Signed)
" °  Problem: Education: Goal: Knowledge of General Education information will improve Description: Including pain rating scale, medication(s)/side effects and non-pharmacologic comfort measures Outcome: Progressing   Problem: Health Behavior/Discharge Planning: Goal: Ability to manage health-related needs will improve Outcome: Progressing   Problem: Clinical Measurements: Goal: Diagnostic test results will improve Outcome: Progressing   Problem: Nutrition: Goal: Adequate nutrition will be maintained Outcome: Progressing   Problem: Skin Integrity: Goal: Risk for impaired skin integrity will decrease Outcome: Progressing   Problem: Nutritional: Goal: Maintenance of adequate nutrition will improve Outcome: Progressing   "

## 2024-04-16 NOTE — Evaluation (Signed)
 Physical Therapy Evaluation Patient Details Name: MARCEIL WELP MRN: [id number] DOB: 1965-04-21 Today's Date: 04/16/2024  History of Present Illness  58 y/o female presented to ED on 04/14/24 for AMS and lethargy. Recent ED visit 12/11 for fall with L comminuted fracture of humeral neck. Admitted for DKA and acute metabolic encephalopathy. MRI negative. PMH: HTN, T2DM, NASH, peripheral neuropathy, fibromylagia.  Clinical Impression  Patient admitted with the above. PTA, patient lives with elderly mother and has been using a cane for mobility since her recent L humeral fx from a fall. Daughter reports patient has had many falls and has baseline balance deficits prior to L humeral fx. Patient presents with weakness, impaired balance, and decreased activity tolerance. Required mod-maxA for bed mobility and minA for sit to stand with HHAx1. Stood x 2 during session and on second attempt, patient able to take 4 steps towards L with HHAx1. Patient reports difficulty advancing LLE at baseline. Patient will benefit from skilled PT services during acute stay to address listed deficits. Patient will benefit from ongoing therapy at discharge to maximize functional independence and safety.         If plan is discharge home, recommend the following: A little help with walking and/or transfers;A lot of help with bathing/dressing/bathroom;Assistance with cooking/housework;Assist for transportation   Can travel by private vehicle   Yes    Equipment Recommendations Other (comment) (TBD)  Recommendations for Other Services       Functional Status Assessment Patient has had a recent decline in their functional status and demonstrates the ability to make significant improvements in function in a reasonable and predictable amount of time.     Precautions / Restrictions Precautions Precautions: Fall Recall of Precautions/Restrictions: Intact Required Braces or Orthoses: Sling Restrictions Weight Bearing  Restrictions Per Provider Order: Yes LUE Weight Bearing Per Provider Order: Non weight bearing      Mobility  Bed Mobility Overal bed mobility: Needs Assistance Bed Mobility: Supine to Sit, Sit to Supine     Supine to sit: Mod assist Sit to supine: Max assist        Transfers Overall transfer level: Needs assistance Equipment used: 1 person hand held assist Transfers: Sit to/from Stand Sit to Stand: Min assist           General transfer comment: Able to take sidesteps at EOB with HHAx1 and minA    Ambulation/Gait                  Stairs            Wheelchair Mobility     Tilt Bed    Modified Rankin (Stroke Patients Only)       Balance Overall balance assessment: Needs assistance Sitting-balance support: Feet supported, No upper extremity supported Sitting balance-Leahy Scale: Fair     Standing balance support: Single extremity supported, During functional activity Standing balance-Leahy Scale: Fair                               Pertinent Vitals/Pain Pain Assessment Pain Assessment: Faces Faces Pain Scale: Hurts whole lot Pain Location: L shoulder Pain Descriptors / Indicators: Guarding, Grimacing, Sharp Pain Intervention(s): Limited activity within patient's tolerance, Monitored during session, Repositioned    Home Living Family/patient expects to be discharged to:: Private residence Living Arrangements: Parent Available Help at Discharge: Family Type of Home: Apartment Home Access: Stairs to enter Entrance Stairs-Rails: None Entrance Stairs-Number of Steps: 1 curb  from parking lot   Home Layout: One level Home Equipment: [place] - single point      Prior Function Prior Level of Function : Independent/Modified Independent;History of Falls (last six months)             Mobility Comments: per daughter, patient has had many falls. has been using cane since L humeral fx ADLs Comments: sponge bathes due to difficulty  getting in/out of bathtub with L shoulder.     Extremity/Trunk Assessment   Upper Extremity Assessment Upper Extremity Assessment: LUE deficits/detail LUE Deficits / Details: L humeral fx, non-op, NWB - sling provided by [facility] LUE: Shoulder pain at rest    Lower Extremity Assessment Lower Extremity Assessment: Generalized weakness    Cervical / Trunk Assessment Cervical / Trunk Assessment: Normal  Communication   Communication Communication: No apparent difficulties    Cognition Arousal: Alert Behavior During Therapy: WFL for tasks assessed/performed   PT - Cognitive impairments: Awareness, Problem solving                         Following commands: Impaired Following commands impaired: Follows one step commands with increased time     Cueing Cueing Techniques: Verbal cues, Visual cues     General Comments      Exercises     Assessment/Plan    PT Assessment Patient needs continued PT services  PT Problem List Decreased strength;Decreased activity tolerance;Decreased balance;Decreased coordination;Decreased mobility;Decreased knowledge of use of DME;Decreased safety awareness;Decreased knowledge of precautions;Cardiopulmonary status limiting activity       PT Treatment Interventions DME instruction;Gait training;Functional mobility training;Therapeutic activities;Therapeutic exercise;Neuromuscular re-education;Balance training;Patient/family education    PT Goals (Current goals can be found in the Care Plan section)  Acute Rehab PT Goals Patient Stated Goal: to reduce pain PT Goal Formulation: With patient/family Time For Goal Achievement: 04/30/24 Potential to Achieve Goals: Good    Frequency Min 2X/week     Co-evaluation               AM-PAC PT 6 Clicks Mobility  Outcome Measure Help needed turning from your back to your side while in a flat bed without using bedrails?: A Lot Help needed moving from lying on your back to  sitting on the side of a flat bed without using bedrails?: A Lot Help needed moving to and from a bed to a chair (including a wheelchair)?: A Lot Help needed standing up from a chair using your arms (e.g., wheelchair or bedside chair)?: A Little Help needed to walk in hospital room?: A Lot Help needed climbing 3-5 steps with a railing? : Total 6 Click Score: 12    End of Session   Activity Tolerance: Patient limited by fatigue Patient left: in bed;with call bell/phone within reach;with bed alarm set;with family/visitor present Nurse Communication: Mobility status PT Visit Diagnosis: Unsteadiness on feet (R26.81);History of falling (Z91.81);Muscle weakness (generalized) (M62.81);Difficulty in walking, not elsewhere classified (R26.2)    Time: 8896-8854 PT Time Calculation (min) (ACUTE ONLY): 42 min   Charges:   PT Evaluation $PT Eval Moderate Complexity: 1 Mod PT Treatments $Therapeutic Activity: 23-37 mins PT General Charges $$ ACUTE PT VISIT: 1 Visit         Maryanne Finder, PT, DPT Physical Therapist - [facility]  [facility]   Aileen Amore A Corrion Stirewalt 04/16/2024, 1:17 PM

## 2024-04-17 DIAGNOSIS — E1110 Type 2 diabetes mellitus with ketoacidosis without coma: Secondary | ICD-10-CM | POA: Diagnosis not present

## 2024-04-17 LAB — CBC
HCT: 30.3 % — ABNORMAL LOW (ref 36.0–46.0)
Hemoglobin: 10.3 g/dL — ABNORMAL LOW (ref 12.0–15.0)
MCH: 32 pg (ref 26.0–34.0)
MCHC: 34 g/dL (ref 30.0–36.0)
MCV: 94.1 fL (ref 80.0–100.0)
Platelets: 478 K/uL — ABNORMAL HIGH (ref 150–400)
RBC: 3.22 MIL/uL — ABNORMAL LOW (ref 3.87–5.11)
RDW: 15.3 % (ref 11.5–15.5)
WBC: 12.6 K/uL — ABNORMAL HIGH (ref 4.0–10.5)
nRBC: 0.4 % — ABNORMAL HIGH (ref 0.0–0.2)

## 2024-04-17 LAB — GLUCOSE, CAPILLARY
Glucose-Capillary: 240 mg/dL — ABNORMAL HIGH (ref 70–99)
Glucose-Capillary: 206 mg/dL — ABNORMAL HIGH (ref 70–99)
Glucose-Capillary: 182 mg/dL — ABNORMAL HIGH (ref 70–99)
Glucose-Capillary: 289 mg/dL — ABNORMAL HIGH (ref 70–99)

## 2024-04-17 LAB — BASIC METABOLIC PANEL WITH GFR
Anion gap: 10 (ref 5–15)
BUN: 16 mg/dL (ref 6–20)
CO2: 24 mmol/L (ref 22–32)
Calcium: 9 mg/dL (ref 8.9–10.3)
Chloride: 105 mmol/L (ref 98–111)
Creatinine, Ser: 0.92 mg/dL (ref 0.44–1.00)
GFR, Estimated: >60 mL/min
Glucose, Bld: 271 mg/dL — ABNORMAL HIGH (ref 70–99)
Potassium: 4 mmol/L (ref 3.5–5.1)
Sodium: 138 mmol/L (ref 135–145)

## 2024-04-17 MED ORDER — INSULIN ASPART 100 UNIT/ML IJ SOLN
0.0000 [IU] | Freq: Every day | INTRAMUSCULAR | Status: DC
  Administered 2024-04-17 – 2024-04-18 (×2): 3 [IU] via SUBCUTANEOUS
  Administered 2024-04-19 – 2024-04-20 (×2): 4 [IU] via SUBCUTANEOUS
  Filled 2024-04-17: qty 4
  Filled 2024-04-17 (×2): qty 3
  Filled 2024-04-17: qty 4

## 2024-04-17 MED ORDER — ATORVASTATIN CALCIUM 20 MG PO TABS
80.0000 mg | ORAL_TABLET | Freq: Every day | ORAL | Status: DC
  Administered 2024-04-17 – 2024-04-20 (×4): 80 mg via ORAL
  Filled 2024-04-17 (×4): qty 4

## 2024-04-17 MED ORDER — LIVING WELL WITH DIABETES BOOK
Freq: Once | Status: AC
  Filled 2024-04-17: qty 1

## 2024-04-17 MED ORDER — MIDODRINE HCL 5 MG PO TABS
2.5000 mg | ORAL_TABLET | Freq: Three times a day (TID) | ORAL | Status: DC
  Administered 2024-04-17 – 2024-04-18 (×2): 2.5 mg via ORAL
  Filled 2024-04-17 (×2): qty 1

## 2024-04-17 MED ORDER — ENOXAPARIN SODIUM 40 MG/0.4ML IJ SOSY
40.0000 mg | PREFILLED_SYRINGE | INTRAMUSCULAR | Status: DC
  Administered 2024-04-17 – 2024-04-20 (×4): 40 mg via SUBCUTANEOUS
  Filled 2024-04-17 (×4): qty 0.4

## 2024-04-17 MED ORDER — INSULIN ASPART 100 UNIT/ML IJ SOLN: 0.0000 [IU] | Freq: Three times a day (TID) | INTRAMUSCULAR | Status: DC

## 2024-04-17 MED ORDER — INSULIN GLARGINE 100 UNIT/ML ~~LOC~~ SOLN
25.0000 [IU] | Freq: Every day | SUBCUTANEOUS | Status: DC
  Administered 2024-04-18 – 2024-04-20 (×3): 25 [IU] via SUBCUTANEOUS
  Filled 2024-04-17 (×4): qty 0.25

## 2024-04-17 NOTE — Inpatient Diabetes Management (Addendum)
 Inpatient Diabetes Program Recommendations  AACE/ADA: New Consensus Statement on Inpatient Glycemic Control (2015)  Target Ranges:  Prepandial:   less than 140 mg/dL      Peak postprandial:   less than 180 mg/dL (1-2 hours)      Critically ill patients:  140 - 180 mg/dL   Lab Results  Component Value Date   GLUCAP 240 (H) 04/17/2024   HGBA1C 16.8 (H) 04/15/2024    Latest Reference Range & Units 04/16/24 08:06 04/16/24 11:55 04/16/24 16:21 04/16/24 22:58 04/17/24 07:52  Glucose-Capillary 70 - 99 mg/dL 776 (H) 793 (H) 724 (H) 280 (H) 240 (H)  (H): Data is abnormally high  Latest Reference Range & Units 04/14/24 13:11  Sodium 135 - 145 mmol/L 140  Potassium 3.5 - 5.1 mmol/L 4.5  Chloride 98 - 111 mmol/L 100  CO2 22 - 32 mmol/L 10 (L)  Glucose 70 - 99 mg/dL 042 (HH)  BUN 6 - 20 mg/dL 27 (H)  Creatinine 9.55 - 1.00 mg/dL 8.39 (H)  Calcium  8.9 - 10.3 mg/dL 89.5 (H)  Anion gap 5 - 15  30 (H)  (HH): Data is critically high (L): Data is abnormally low (H): Data is abnormally high  Latest Reference Range & Units 04/17/24 06:46  Sodium 135 - 145 mmol/L 138  Potassium 3.5 - 5.1 mmol/L 4.0  Chloride 98 - 111 mmol/L 105  CO2 22 - 32 mmol/L 24  Glucose 70 - 99 mg/dL 728 (H)  BUN 6 - 20 mg/dL 16  Creatinine 9.55 - 8.99 mg/dL 9.07  Calcium  8.9 - 10.3 mg/dL 9.0  Anion gap 5 - 15  10  (H): Data is abnormally high  Diabetes history: DM2 Outpatient Diabetes medications:  Basaglar  35 units daily Novolog  0-15 units tid meal coverage Metformin  1 gm bid Current orders for Inpatient glycemic control: Lantus  20 units daily Novolog  5 units tid meal coverage Novolog  0-9 units tid  Inpatient Diabetes Program Recommendations:   Please consider: -Increase Lantus  to 25 units daily -Add Novolog  0-5 units hs correction  Ordered Living Well With Diabetes booklet for patient review and plan to speak with patient today regarding diabetes management. A1c of 16.8 (average blood glucose of 435 over the  past 2-3 months).  1:00 pm Spoke with patient @ bedside. Patient states she has been checking her CBGs @ home but never ran as high as 400. Reviewed with patient normal range of A1c 6.5-7. Patient states she only drinks water , eats very little sugar or carbohydrates with high sugar or high glycemic index. Explained to patient need to adjust insulin  according to her glucose levels. Patient does not know if she wants to try a CGM and does not have a cell phone here with her @ this time. Reviewed insulin  administration and refrigeration and patient is following guidelines with both. Unsure why glucose levels so high @ this time according to patient.  Thank you, Kwan Shellhammer E. Delaney Perona, RN, MSN, CNS, CDCES  Diabetes Coordinator Inpatient Glycemic Control Team Team Pager [phone] (8am-5pm) 04/17/2024 10:32 AM

## 2024-04-17 NOTE — Progress Notes (Signed)
 " PROGRESS NOTE    [name]   FMW:[id number] DOB: 30-Jun-1965  DOA: 04/14/2024 Date of Service: 04/17/2024 which is hospital day 3  PCP: [facility] course / significant events:   HPI: [name] is a 58 y.o. year old female with medical history of hypertension, hyperlipidemia, type 2 diabetes presenting to the ED with confusion and somnolence.  Patient was brought here by her mother who states patient has not had significant p.o. intake in the last 2 days.    12/26: On arrival to the ED patient was noted to be HDS stable.  Lab work and imaging obtained.  CBC with leukocytosis at 16.7, mild anemia at 11.1 significant thrombocytosis at 806.  CMP with severe hyperglycemia at 957 with significant anion gap metabolic acidosis with bicarb at 10 and AKI and hypercalcium which are likely mediated by dehydration.  Magnesium  was checked and 2.7.  Serum osmolality elevated at 373, BHB greater than 8.  VBG shows severe acidemia at 7.1 4/32/43.  TSH checked and less than 0.100 likely in setting of acute illness.  UA shows nitrite positive and bacteria.  Urine culture pending.  Ammonia level normal.  Patient's position consistent with DKA so DKA protocol initiated.  Given presentation, [facility] contacted for admission. DKA protocol in stepdown unit 12/27: DKA resolved pt still somnolent though she is following commands, c/o shoulder pain, not answering other questions. Known humerus fx - confirmed w/ ortho on call Dr Lorelle to review XR - shows some displacement but nothing to do operatively at this time. MRI brain given persistent encephalopathy, question TBI / CVA. Echocardiogram ordered to eval possible syncope cause for falling but suspect DKA had been developing for some time and causing weakness/fall.  12/28: much more alert today, conversational, oriented. Cannot recall home dose insulin  or other meds, not sure if she was taking insulin  per instructions.  Adjusting insulin  as needed. 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 Daughter request discuss w/ ortho re humerus fracture, will try to reach out to primary orthopedist 12/29: Adjusting insulin , awaiting SNF rehab placement, patient otherwise stable/doing well     Consultants:   none  Procedures/Surgeries: none      ASSESSMENT & PLAN:   DKA - resolved AGMA - resolved  Secondary to insulin  non-adherence  insulin  drip --> subcu and doing well ,adjust as needed Low dose basal and low dose SSI increased now that not NPO   Acute metabolic encephalopathy - resolved  monitor  Multiple falls at home Likely weakness/illness related to DKA Possible other syncope etiology Question TBI / concussion  Echo no concrns Telemetry can DC  AKI - resolved/improved w/ hydration Monitor BMP  UTI -Klebsiella resistant to nitrofurantoin and ampicillin Rocephin  5 days total  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  Needs outpatient GI follow up.   Humerus fracture Maintain sling.  outpatient ortho follow up -patient/family have asked to speak to orthopedics here regarding surgical option to better understand medical decision making around this, possibly pursue repair/second opinion Pain control    Essential hypertension Blood pressure has been low here Question overmedication outpatient on lisinopril  and HCT outpatient.  hold given AKI. IV antihypertensive prn  Added midodrine  today due to persistent low BP   Hyperlipidemia statin   Fibromyalgia Chronic pain  Resume home SNRI, tramadol    GERD  PPI     overweight based on BMI: Body mass index is 27.1 kg/m.[name] Significantly low or high  BMI is associated with higher medical risk.  Underweight - under 18  overweight - 25 to 29 obese - 30 or more Class 1 obesity: BMI of 30.0 to 34 Class 2 obesity: BMI of 35.0 to 39 Class 3 obesity: BMI of 40.0 to 49 Super Morbid Obesity: BMI  50-59 Super-super Morbid Obesity: BMI 60+ Healthy nutrition and physical activity advised as adjunct to other disease management and risk reduction treatments    DVT prophylaxis: heparin  w/ AKI IV fluids: D5 0.5NS continuous IV fluids  Nutrition: npo Central lines / other devices: none  Code Status: FULL CODE ACP documentation reviewed:  none on file in [facility]  TOC needs: TBD Medical barriers to dispo: encephalopathy, resolving DKA. Expected medical readiness for discharge few days.           Subjective / Brief ROS:  Patient remains alert Only complaint at this time is on pain   Family Communication: none at bedside on rounds     Objective Findings:  Vitals:   04/17/24 0500 04/17/24 0750 04/17/24 0951 04/17/24 1428  BP: (!) 128/55 (!) 113/54  (!) 94/49  Pulse: 62 64  72  Resp: 18 16  16   Temp: 98.3 F (36.8 C) 98.9 F (37.2 C)  98.6 F (37 C)  TempSrc: Oral     SpO2: 95% 97%  100%  Weight:   73.1 kg   Height:        Intake/Output Summary (Last 24 hours) at 04/17/2024 1846 Last data filed at 04/17/2024 1447 Gross per 24 hour  Intake 200 ml  Output 1700 ml  Net -1500 ml   Filed Weights   04/14/24 1830 04/16/24 0345 04/17/24 0951  Weight: 69.4 kg 72.6 kg 73.1 kg    Examination:  Physical Exam Constitutional:      General: She is not in acute distress.    Appearance: She is not ill-appearing.  Cardiovascular:     Rate and Rhythm: Normal rate and regular rhythm.  Pulmonary:     Effort: Pulmonary effort is normal. No respiratory distress.     Breath sounds: No wheezing.  Abdominal:     General: Abdomen is flat. Bowel sounds are normal.  Musculoskeletal:     Right lower leg: No edema.     Left lower leg: No edema.  Neurological:     Mental Status: She is alert and oriented to person, place, and time.          Scheduled Medications:   atorvastatin   80 mg Oral QHS   Chlorhexidine  Gluconate Cloth  6 each Topical Q0600   DULoxetine   60 mg  Oral QHS   enoxaparin  (LOVENOX ) injection  40 mg Subcutaneous Q24H   gabapentin   300 mg Oral TID   insulin  aspart  0-5 Units Subcutaneous QHS   insulin  aspart  0-9 Units Subcutaneous TID WC   insulin  aspart  5 Units Subcutaneous TID WC   [START ON 04/18/2024] insulin  glargine  25 Units Subcutaneous Daily   midodrine   2.5 mg Oral TID WC   naproxen   250 mg Oral TID WC   pantoprazole   40 mg Oral Daily   sodium chloride  flush  3 mL Intravenous Q12H    Continuous Infusions:  cefTRIAXone  (ROCEPHIN )  IV 1 g (04/17/24 1627)    PRN Medications:  dextrose , ondansetron  **OR** ondansetron  (ZOFRAN ) IV, mouth rinse, senna-docusate, traMADol   Antimicrobials from admission:  Anti-infectives (From admission, onward)    Start     Dose/Rate Route Frequency Ordered Stop  04/15/24 1600  cefTRIAXone  (ROCEPHIN ) 1 g in sodium chloride  0.9 % 100 mL IVPB        1 g 200 mL/hr over 30 Minutes Intravenous Every 24 hours 04/15/24 1509             Data Reviewed:  I have personally reviewed the following...  CBC: Recent Labs  Lab 04/14/24 1311 04/15/24 0318 04/16/24 0057 04/17/24 0646  WBC 16.7* 19.5* 14.1* 12.6*  NEUTROABS 14.7*  --   --   --   HGB 11.1* 9.8* 9.3* 10.3*  HCT 34.6* 28.9* 27.9* 30.3*  MCV 98.9 92.0 95.5 94.1  PLT 806* 686* 560* 478*   Basic Metabolic Panel: Recent Labs  Lab 04/14/24 1311 04/14/24 1616 04/14/24 1956 04/14/24 2208 04/15/24 0318 04/15/24 0632 04/15/24 1548 04/16/24 0057 04/17/24 0646  NA 140   < > 147* 151* 146* 145 147* 143 138  K 4.5   < > 3.2* 3.0* 3.7 4.0 3.6 4.1 4.0  CL 100   < > 111 113* 111 110 112* 108 105  CO2 10*   < > 21* 24 27 29 27 27 24   GLUCOSE 957*   < > 467* 283* 215* 218* 95 241* 271*  BUN 27*   < > 23* 21* 18 17 14 13 16   CREATININE 1.60*   < > 1.26* 1.14* 1.07* 1.10* 1.01* 1.04* 0.92  CALCIUM  10.4*   < > 9.9 10.0 9.5 9.1 8.7* 8.7* 9.0  MG 2.7*  --  2.3  --   --   --   --   --   --   PHOS  --   --   --  <1.0*  --  2.6  --   --    --    < > = values in this interval not displayed.   GFR: Estimated Creatinine Clearance: 65.9 mL/min (by C-G formula based on SCr of 0.92 mg/dL). Liver Function Tests: Recent Labs  Lab 04/14/24 1311  AST <10*  ALT 12  ALKPHOS 208*  BILITOT 0.3  PROT 7.0  ALBUMIN  3.6   No results for input(s): LIPASE, AMYLASE in the last 168 hours. Recent Labs  Lab 04/14/24 1311  AMMONIA <13   Coagulation Profile: No results for input(s): INR, PROTIME in the last 168 hours. Cardiac Enzymes: Recent Labs  Lab 04/14/24 1311  CKTOTAL 69   BNP (last 3 results) No results for input(s): PROBNP in the last 8760 hours. HbA1C: Recent Labs    04/15/24 1811  HGBA1C 16.8*   CBG: Recent Labs  Lab 04/16/24 1621 04/16/24 2258 04/17/24 0752 04/17/24 1129 04/17/24 1716  GLUCAP 275* 280* 240* 206* 182*   Lipid Profile: No results for input(s): CHOL, HDL, LDLCALC, TRIG, CHOLHDL, LDLDIRECT in the last 72 hours. Thyroid  Function Tests: No results for input(s): TSH, T4TOTAL, FREET4, T3FREE, THYROIDAB in the last 72 hours.  Anemia Panel: No results for input(s): VITAMINB12, FOLATE, FERRITIN, TIBC, IRON, RETICCTPCT in the last 72 hours. Most Recent Urinalysis On File:     Component Value Date/Time   COLORURINE STRAW (A) 04/14/2024 1311   APPEARANCEUR CLEAR (A) 04/14/2024 1311   LABSPEC 1.027 04/14/2024 1311   PHURINE 5.0 04/14/2024 1311   GLUCOSEU >=500 (A) 04/14/2024 1311   HGBUR NEGATIVE 04/14/2024 1311   BILIRUBINUR NEGATIVE 04/14/2024 1311   KETONESUR 20 (A) 04/14/2024 1311   PROTEINUR NEGATIVE 04/14/2024 1311   UROBILINOGEN 0.2 01/19/2012 2019   NITRITE POSITIVE (A) 04/14/2024 1311   LEUKOCYTESUR NEGATIVE 04/14/2024 1311  Sepsis Labs: @LABRCNTIP (procalcitonin:4,lacticidven:4) Microbiology: Recent Results (from the past 240 hours)  Resp panel by RT-PCR (RSV, Flu A&B, Covid) Anterior Nasal Swab     Status: None   Collection Time:  04/14/24  1:11 PM   Specimen: Anterior Nasal Swab  Result Value Ref Range Status   SARS Coronavirus 2 by RT PCR NEGATIVE NEGATIVE Final    Comment: (NOTE) SARS-CoV-2 target nucleic acids are NOT DETECTED.  The SARS-CoV-2 RNA is generally detectable in upper respiratory specimens during the acute phase of infection. The lowest concentration of SARS-CoV-2 viral copies this assay can detect is 138 copies/mL. A negative result does not preclude SARS-Cov-2 infection and should not be used as the sole basis for treatment or other patient management decisions. A negative result may occur with  improper specimen collection/handling, submission of specimen other than nasopharyngeal swab, presence of viral mutation(s) within the areas targeted by this assay, and inadequate number of viral copies(<138 copies/mL). A negative result must be combined with clinical observations, patient history, and epidemiological information. The expected result is Negative.  Fact Sheet for Patients:  bloggercourse.com  Fact Sheet for Healthcare Providers:  seriousbroker.it  This test is no t yet approved or cleared by the [place]  FDA and  has been authorized for detection and/or diagnosis of SARS-CoV-2 by FDA under an Emergency Use Authorization (EUA). This EUA will remain  in effect (meaning this test can be used) for the duration of the COVID-19 declaration under Section 564(b)(1) of the Act, 21 U.S.C.section 360bbb-3(b)(1), unless the authorization is terminated  or revoked sooner.       Influenza A by PCR NEGATIVE NEGATIVE Final   Influenza B by PCR NEGATIVE NEGATIVE Final    Comment: (NOTE) The Xpert Xpress SARS-CoV-2/FLU/RSV plus assay is intended as an aid in the diagnosis of influenza from Nasopharyngeal swab specimens and should not be used as a sole basis for treatment. Nasal washings and aspirates are unacceptable for Xpert Xpress  SARS-CoV-2/FLU/RSV testing.  Fact Sheet for Patients: bloggercourse.com  Fact Sheet for Healthcare Providers: seriousbroker.it  This test is not yet approved or cleared by the [place]  FDA and has been authorized for detection and/or diagnosis of SARS-CoV-2 by FDA under an Emergency Use Authorization (EUA). This EUA will remain in effect (meaning this test can be used) for the duration of the COVID-19 declaration under Section 564(b)(1) of the Act, 21 U.S.C. section 360bbb-3(b)(1), unless the authorization is terminated or revoked.     Resp Syncytial Virus by PCR NEGATIVE NEGATIVE Final    Comment: (NOTE) Fact Sheet for Patients: bloggercourse.com  Fact Sheet for Healthcare Providers: seriousbroker.it  This test is not yet approved or cleared by the [place]  FDA and has been authorized for detection and/or diagnosis of SARS-CoV-2 by FDA under an Emergency Use Authorization (EUA). This EUA will remain in effect (meaning this test can be used) for the duration of the COVID-19 declaration under Section 564(b)(1) of the Act, 21 U.S.C. section 360bbb-3(b)(1), unless the authorization is terminated or revoked.  Performed at [facility], [address] [place], [place] [address]   Urine Culture (for pregnant, neutropenic or urologic patients or patients with an indwelling urinary catheter)     Status: Abnormal   Collection Time: 04/14/24  1:11 PM   Specimen: Urine, Clean Catch  Result Value Ref Range Status   Specimen Description   Final    URINE, CLEAN CATCH Performed at [facility], [address], [place], [place] [address]  Special Requests   Final    NONE Performed at [facility], [address] [place], [place] [address]    Culture >=100,000 COLONIES/mL KLEBSIELLA PNEUMONIAE (A)  Final   Report Status 04/16/2024 FINAL  Final    Organism ID, Bacteria KLEBSIELLA PNEUMONIAE (A)  Final      Susceptibility   Klebsiella pneumoniae - MIC*    AMPICILLIN RESISTANT Resistant     CEFAZOLIN  (URINE) Value in next row Sensitive      <=1 SENSITIVEThis is a modified FDA-approved test that has been validated and its performance characteristics determined by the reporting laboratory.  This laboratory is certified under the Clinical Laboratory Improvement Amendments CLIA as qualified to perform high complexity clinical laboratory testing.    CEFEPIME Value in next row Sensitive      <=1 SENSITIVEThis is a modified FDA-approved test that has been validated and its performance characteristics determined by the reporting laboratory.  This laboratory is certified under the Clinical Laboratory Improvement Amendments CLIA as qualified to perform high complexity clinical laboratory testing.    ERTAPENEM Value in next row Sensitive      <=1 SENSITIVEThis is a modified FDA-approved test that has been validated and its performance characteristics determined by the reporting laboratory.  This laboratory is certified under the Clinical Laboratory Improvement Amendments CLIA as qualified to perform high complexity clinical laboratory testing.    CEFTRIAXONE  Value in next row Sensitive      <=1 SENSITIVEThis is a modified FDA-approved test that has been validated and its performance characteristics determined by the reporting laboratory.  This laboratory is certified under the Clinical Laboratory Improvement Amendments CLIA as qualified to perform high complexity clinical laboratory testing.    CIPROFLOXACIN  Value in next row Sensitive      <=1 SENSITIVEThis is a modified FDA-approved test that has been validated and its performance characteristics determined by the reporting laboratory.  This laboratory is certified under the Clinical Laboratory Improvement Amendments CLIA as qualified to perform high complexity clinical laboratory testing.    GENTAMICIN  Value in next row Sensitive      <=1 SENSITIVEThis is a modified FDA-approved test that has been validated and its performance characteristics determined by the reporting laboratory.  This laboratory is certified under the Clinical Laboratory Improvement Amendments CLIA as qualified to perform high complexity clinical laboratory testing.    NITROFURANTOIN Value in next row Resistant      <=1 SENSITIVEThis is a modified FDA-approved test that has been validated and its performance characteristics determined by the reporting laboratory.  This laboratory is certified under the Clinical Laboratory Improvement Amendments CLIA as qualified to perform high complexity clinical laboratory testing.    TRIMETH/SULFA Value in next row Sensitive      <=1 SENSITIVEThis is a modified FDA-approved test that has been validated and its performance characteristics determined by the reporting laboratory.  This laboratory is certified under the Clinical Laboratory Improvement Amendments CLIA as qualified to perform high complexity clinical laboratory testing.    AMPICILLIN/SULBACTAM Value in next row Sensitive      <=1 SENSITIVEThis is a modified FDA-approved test that has been validated and its performance characteristics determined by the reporting laboratory.  This laboratory is certified under the Clinical Laboratory Improvement Amendments CLIA as qualified to perform high complexity clinical laboratory testing.    PIP/TAZO Value in next row Sensitive      <=4 SENSITIVEThis is a modified FDA-approved test that has been validated and its performance characteristics determined by the reporting  laboratory.  This laboratory is certified under the Clinical Laboratory Improvement Amendments CLIA as qualified to perform high complexity clinical laboratory testing.    MEROPENEM  Value in next row Sensitive      <=4 SENSITIVEThis is a modified FDA-approved test that has been validated and its performance characteristics determined  by the reporting laboratory.  This laboratory is certified under the Clinical Laboratory Improvement Amendments CLIA as qualified to perform high complexity clinical laboratory testing.    * >=100,000 COLONIES/mL KLEBSIELLA PNEUMONIAE  MRSA Next Gen by PCR, Nasal     Status: None   Collection Time: 04/14/24  6:32 PM   Specimen: Nasal Mucosa; Nasal Swab  Result Value Ref Range Status   MRSA by PCR Next Gen NOT DETECTED NOT DETECTED Final    Comment: (NOTE) The GeneXpert MRSA Assay (FDA approved for NASAL specimens only), is one component of a comprehensive MRSA colonization surveillance program. It is not intended to diagnose MRSA infection nor to guide or monitor treatment for MRSA infections. Test performance is not FDA approved in patients less than 37 years old. Performed at [facility], [address] [place], [place] [address]       Radiology Studies last 3 days: ECHOCARDIOGRAM COMPLETE Result Date: 04/16/2024    ECHOCARDIOGRAM REPORT   Patient Name:   ZANAI MALLARI Date of Exam: 04/16/2024 Medical Rec #:  [id number]       Height:       65.0 in Accession #:    [id number]      Weight:       160.1 lb Date of Birth:  [birth year]       BSA:          1.799 m Patient Age:    58 years        BP:           110/45 mmHg Patient Gender: F               HR:           71 bpm. Exam Location:  [facility] Procedure: 2D Echo, Color Doppler and Cardiac Doppler (Both Spectral and Color            Flow Doppler were utilized during procedure). Indications:     R55 Syncope  History:         Patient has prior history of Echocardiogram examinations.                  Previous Myocardial Infarction; Risk Factors:Hypertension,                  Diabetes and Dyslipidemia.  Sonographer:     L. Thornton-Maynard, RDCS Referring Phys:  [id number] LANETA BLUNT Diagnosing Phys: Cara JONETTA Lovelace MD  Sonographer Comments: Global longitudinal strain was attempted. IMPRESSIONS  1. Left ventricular ejection fraction, by  estimation, is 60 to 65%. The left ventricle has normal function. The left ventricle has no regional wall motion abnormalities. Left ventricular diastolic parameters are consistent with Grade I diastolic dysfunction (impaired relaxation). The average left ventricular global longitudinal strain is 18.0 %. The global longitudinal strain is normal.  2. Right ventricular systolic function is normal. The right ventricular size is normal. There is normal pulmonary artery systolic pressure.  3. The mitral valve is normal in structure. Trivial mitral valve regurgitation.  4. The aortic valve is normal in structure. Aortic valve regurgitation is not visualized. FINDINGS  Left Ventricle: Left ventricular ejection fraction, by estimation, is 60  to 65%. The left ventricle has normal function. The left ventricle has no regional wall motion abnormalities. The average left ventricular global longitudinal strain is 18.0 %. Strain was performed and the global longitudinal strain is normal. The left ventricular internal cavity size was normal in size. There is borderline left ventricular hypertrophy. Left ventricular diastolic parameters are consistent with Grade I diastolic  dysfunction (impaired relaxation). Right Ventricle: The right ventricular size is normal. No increase in right ventricular wall thickness. Right ventricular systolic function is normal. There is normal pulmonary artery systolic pressure. The tricuspid regurgitant velocity is 1.67 m/s, and  with an assumed right atrial pressure of 3 mmHg, the estimated right ventricular systolic pressure is 14.2 mmHg. Left Atrium: Left atrial size was normal in size. Right Atrium: Right atrial size was normal in size. Pericardium: There is no evidence of pericardial effusion. Mitral Valve: The mitral valve is normal in structure. Trivial mitral valve regurgitation. Tricuspid Valve: The tricuspid valve is normal in structure. Tricuspid valve regurgitation is trivial. Aortic Valve:  The aortic valve is normal in structure. Aortic valve regurgitation is not visualized. Aortic valve mean gradient measures 5.5 mmHg. Aortic valve peak gradient measures 9.7 mmHg. Aortic valve area, by VTI measures 1.78 cm. Pulmonic Valve: The pulmonic valve was not well visualized. Pulmonic valve regurgitation is not visualized. Aorta: The ascending aorta was not well visualized. IAS/Shunts: No atrial level shunt detected by color flow Doppler. Additional Comments: 3D was performed not requiring image post processing on an independent workstation and was indeterminate.  LEFT VENTRICLE PLAX 2D LVIDd:         4.50 cm     Diastology LVIDs:         2.90 cm     LV e' medial:    7.94 cm/s LV PW:         1.00 cm     LV E/e' medial:  10.1 LV IVS:        1.00 cm     LV e' lateral:   11.20 cm/s LVOT diam:     2.00 cm     LV E/e' lateral: 7.2 LV SV:         54 LV SV Index:   30          2D Longitudinal Strain LVOT Area:     3.14 cm    2D Strain GLS (A4C):   19.9 % LV IVRT:       85 msec     2D Strain GLS (A3C):   15.4 %                            2D Strain GLS (A2C):   18.6 %                            2D Strain GLS Avg:     18.0 % LV Volumes (MOD) LV vol d, MOD A2C: 52.4 ml LV vol d, MOD A4C: 81.0 ml LV vol s, MOD A2C: 25.2 ml LV vol s, MOD A4C: 27.1 ml LV SV MOD A2C:     27.2 ml LV SV MOD A4C:     81.0 ml LV SV MOD BP:      39.9 ml RIGHT VENTRICLE RV Basal diam:  2.40 cm RV Mid diam:    2.10 cm RV S prime:     10.00 cm/s TAPSE (M-mode): 1.6 cm LEFT  ATRIUM             Index        RIGHT ATRIUM          Index LA diam:        2.50 cm 1.39 cm/m   RA Area:     9.56 cm LA Vol (A2C):   48.1 ml 26.73 ml/m  RA Volume:   18.20 ml 10.11 ml/m LA Vol (A4C):   26.1 ml 14.50 ml/m LA Biplane Vol: 35.9 ml 19.95 ml/m  AORTIC VALVE                     PULMONIC VALVE AV Area (Vmax):    2.01 cm      PV Vmax:       0.94 m/s AV Area (Vmean):   1.90 cm      PV Peak grad:  3.5 mmHg AV Area (VTI):     1.78 cm AV Vmax:           156.00 cm/s  AV Vmean:          106.500 cm/s AV VTI:            0.302 m AV Peak Grad:      9.7 mmHg AV Mean Grad:      5.5 mmHg LVOT Vmax:         100.00 cm/s LVOT Vmean:        64.500 cm/s LVOT VTI:          0.171 m LVOT/AV VTI ratio: 0.57  AORTA Ao Root diam: 3.10 cm Ao Asc diam:  3.10 cm MITRAL VALVE               TRICUSPID VALVE MV Area (PHT): 4.31 cm    TR Peak grad:   11.2 mmHg MV Decel Time: 176 msec    TR Vmax:        167.00 cm/s MV E velocity: 80.20 cm/s MV A velocity: 84.90 cm/s  SHUNTS MV E/A ratio:  0.94        Systemic VTI:  0.17 m                            Systemic Diam: 2.00 cm Cara JONETTA Lovelace MD Electronically signed by Cara JONETTA Lovelace MD Signature Date/Time: 04/16/2024/11:15:53 AM    Final    MR BRAIN WO CONTRAST Result Date: 04/15/2024 EXAM: MRI BRAIN WITHOUT CONTRAST 04/15/2024 02:43:23 PM TECHNIQUE: Multiplanar multisequence MRI of the head/brain was performed without the administration of intravenous contrast. COMPARISON: CT head 04/14/2024 and MRI head 10/24/2021. CLINICAL HISTORY: Neuro deficit, acute, stroke suspected. FINDINGS: BRAIN AND VENTRICLES: No acute infarct. No intracranial hemorrhage. No mass. No midline shift. No hydrocephalus. Mild areas of T2 and FLAIR hyperintensity in the periventricular white matter which are slightly increased since the 2023 MRI suggestive of mild chronic microvascular ischemic changes. 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 The sella is unremarkable. Normal flow voids. ORBITS: No acute abnormality. SINUSES AND MASTOIDS: Trace fluid in the mastoid air cells. BONES AND SOFT TISSUES: Normal marrow signal. No acute soft tissue abnormality. IMPRESSION: 1. No acute intracranial abnormality. 2. 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 3. Mild chronic microvascular ischemic changes, slightly increased since the 2023 MRI. Electronically  signed by: Donnice Mania MD 04/15/2024 03:43 PM EST RP  Workstation: [id number]   DG Humerus Left Result Date: 04/15/2024 CLINICAL DATA:  Left humeral fracture. EXAM: LEFT HUMERUS - 2+ VIEW COMPARISON:  Radiograph 03/30/2024 FINDINGS: Left proximal humerus fracture demonstrates increased displacement from prior exam. Osseous overriding of 7-8 mm. There is progressive apex lateral angulation. No internal or peripheral callus formation. No evidence of glenohumeral dislocation. The distal humerus is intact. Mild soft tissue edema persists. IMPRESSION: Increased displacement and angulation of left proximal humerus fracture. No internal or peripheral callus formation. Electronically Signed   By: Andrea Gasman M.D.   On: 04/15/2024 13:34   CT ABDOMEN PELVIS WO CONTRAST Result Date: 04/14/2024 CLINICAL DATA:  Acute generalized abdominal pain EXAM: CT ABDOMEN AND PELVIS WITHOUT CONTRAST TECHNIQUE: Multidetector CT imaging of the abdomen and pelvis was performed following the standard protocol without IV contrast. RADIATION DOSE REDUCTION: This exam was performed according to the departmental dose-optimization program which includes automated exposure control, adjustment of the mA and/or kV according to patient size and/or use of iterative reconstruction technique. COMPARISON:  November 10, 2021 FINDINGS: Lower chest: No acute abnormality. Hepatobiliary: No focal liver abnormality is seen. No gallstones, gallbladder wall thickening, or biliary dilatation. 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 No pancreatic ductal dilatation or surrounding inflammatory changes. Spleen: Status post splenectomy. Adrenals/Urinary Tract: Adrenal glands are unremarkable. Mild right renal atrophy is noted. No hydronephrosis or renal obstruction is noted. Bladder is unremarkable. Stomach/Bowel: Stomach is within normal limits. Appendix appears normal. No evidence of bowel wall  thickening, distention, or inflammatory changes. Vascular/Lymphatic: Aortic atherosclerosis. No enlarged abdominal or pelvic lymph nodes. Reproductive: Status post hysterectomy. No adnexal masses. Other: No abdominal wall hernia or abnormality. No abdominopelvic ascites. Musculoskeletal: No acute or significant osseous findings. IMPRESSION: 1. 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 2. Mild right renal atrophy. 3. No acute abnormality seen in the abdomen or pelvis. 4. Aortic atherosclerosis. Aortic Atherosclerosis (ICD10-I70.0). Electronically Signed   By: Lynwood Landy Raddle M.D.   On: 04/14/2024 14:13   DG Chest Portable 1 View Result Date: 04/14/2024 CLINICAL DATA:  Altered mental status EXAM: PORTABLE CHEST 1 VIEW COMPARISON:  October 09, 2021 FINDINGS: The heart size and mediastinal contours are within normal limits. Both lungs are clear. Moderately displaced and comminuted fracture is seen involving the proximal left humerus. IMPRESSION: No active cardiopulmonary disease. Moderately displaced and comminuted proximal left humeral fracture. Electronically Signed   By: Lynwood Landy Raddle M.D.   On: 04/14/2024 14:06   CT HEAD WO CONTRAST ( ) Result Date: 04/14/2024 EXAM: CT HEAD WITHOUT CONTRAST 04/14/2024 01:40:08 PM TECHNIQUE: CT of the head was performed without the administration of intravenous contrast. Automated exposure control, iterative reconstruction, and/or weight based adjustment of the mA/kV was utilized to reduce the radiation dose to as low as reasonably achievable. COMPARISON: CT head 03/29/2014. MRI brain 10/24/2021. CLINICAL HISTORY: Head trauma, abnormal mental status (Age 38-64y). FINDINGS: BRAIN AND VENTRICLES: No acute hemorrhage. No evidence of acute infarct. No hydrocephalus. No extra-axial collection. No mass effect or midline shift. ORBITS: No acute abnormality. SINUSES: No acute abnormality. SOFT TISSUES AND SKULL: No acute soft tissue abnormality.  No skull fracture. IMPRESSION: 1. No acute intracranial abnormality. Electronically signed by: Ryan Chess MD 04/14/2024 01:45 PM EST RP Workstation: [id number]        Laneta Blunt, DO [facility] 04/17/2024, 6:46 PM    Dictation software may have been used to generate the above note. Typos may occur and escape review in typed/dictated  notes. Please contact Dr Marsa directly for clarity if needed.  Staff may message me via secure chat in Epic  but this may not receive an immediate response,  please page me for urgent matters!  If 7PM-7AM, please contact night coverage [email or web address]       "

## 2024-04-17 NOTE — TOC Initial Note (Signed)
 Transition of Care [facility]) - Initial/Assessment Note    Patient Details  Name: [name] MRN: [id number] Date of Birth: [birth year]  Transition of [facility]) CM/SW Contact:    Alfonso Rummer, LCSW Phone Number: 04/17/2024, 4:52 PM  Clinical Narrative:                    KEN DELENA Rummer completed toc visit with pt at bedside room 224. Pt is informed of recommendations and agree to transition to snf. Pt reports she lives with her mother and will transition home after she discharges from snf. Pt reports she does not have a preference of snf. Pt reports daughter Ulanda is involved in her care [phone].      Patient Goals and CMS Choice            Expected Discharge Plan and Services                                              Prior Living Arrangements/Services                       Activities of Daily Living   ADL Screening (condition at time of admission) Independently performs ADLs?: Yes (appropriate for developmental age) Is the patient deaf or have difficulty hearing?: No Does the patient have difficulty seeing, even when wearing glasses/contacts?: No Does the patient have difficulty concentrating, remembering, or making decisions?: No  Permission Sought/Granted                  Emotional Assessment              Admission diagnosis:  DKA (diabetic ketoacidosis) (HCC) [E11.10] Altered mental status, unspecified altered mental status type [R41.82] Diabetic ketoacidosis without coma associated with other specified diabetes mellitus (HCC) [E13.10] Patient Active Problem List   Diagnosis Date Noted   DKA (diabetic ketoacidosis) (HCC) 04/14/2024   Uncontrolled type 2 diabetes mellitus with hyperglycemia, with long-term current use of insulin  (HCC) 10/28/2021   Obesity (BMI 30-39.9) 10/28/2021   Anemia of infection and chronic disease 10/28/2021   Hypoalbuminemia 10/28/2021   Acute HFrEF (heart failure with reduced ejection  fraction) (HCC) 10/28/2021   E coli bacteremia    Acute respiratory failure with hypoxia (HCC)    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   Thrombocytopenia 10/05/2021   AKI (acute kidney injury) 10/05/2021   Elevated troponin 10/05/2021   Acidosis 10/05/2021   Cirrhosis (HCC) 10/05/2021   Septic shock (HCC) 10/05/2021   Severe sepsis (HCC)    Urinary tract infection without hematuria    Depression 09/21/2014   Cough 09/19/2014   Dyspnea 05/10/2014   Angina decubitus 03/28/2014   Memory loss 03/28/2014   Bleeding gums 03/28/2014   Sinus bradycardia    Ischemic cardiomyopathy    CAD (coronary artery disease)    Hypokalemia 03/05/2014   Essential hypertension    ST elevation myocardial infarction (STEMI) involving right coronary artery with complication (HCC) 03/03/2014   Left rotator cuff tear 07/27/2013   Complete rotator cuff tear of left shoulder 07/27/2013   Otalgia 05/25/2013   Rotator cuff tear, non-traumatic 03/02/2013   Weakness generalized 09/06/2012   Fall 09/06/2012   Adjustment disorder with depressed mood 09/06/2012   Dizziness and giddiness 07/25/2012   Fibromyalgia 05/12/2012   Asplenia 05/12/2012   Diarrhea 01/07/2012   Nonalcoholic fatty liver  disease 01/07/2012   GERD (gastroesophageal reflux disease) 09/15/2011   Hyperlipidemia    Lateral epicondylitis  of elbow 12/17/2008   PCP:  [facility] Personal Pharmacy:   [facility] [address] [place] - [address] [place] [address] Phone: [phone] Fax: [phone]  [facility] [address] [place] [address] Phone: [phone] Fax: [phone]  [id number] [place], [place] - [address] AT [facility] OF [address] [place] [address] Phone: [phone] Fax: [phone]     Social Drivers of Health (SDOH) Social History: SDOH Screenings    Depression (PHQ2-9): Low Risk (11/28/2021)  Tobacco Use: Medium Risk (04/15/2024)   SDOH Interventions:     Readmission Risk Interventions    10/28/2021   10:28 AM  Readmission Risk Prevention Plan  Post Dischage Appt Complete  Medication Screening Complete  Transportation Screening Complete

## 2024-04-18 DIAGNOSIS — E1110 Type 2 diabetes mellitus with ketoacidosis without coma: Secondary | ICD-10-CM | POA: Diagnosis not present

## 2024-04-18 LAB — BASIC METABOLIC PANEL WITH GFR
Anion gap: 9 (ref 5–15)
BUN: 17 mg/dL (ref 6–20)
CO2: 26 mmol/L (ref 22–32)
Calcium: 8.9 mg/dL (ref 8.9–10.3)
Chloride: 103 mmol/L (ref 98–111)
Creatinine, Ser: 0.95 mg/dL (ref 0.44–1.00)
GFR, Estimated: >60 mL/min
Glucose, Bld: 233 mg/dL — ABNORMAL HIGH (ref 70–99)
Potassium: 4 mmol/L (ref 3.5–5.1)
Sodium: 138 mmol/L (ref 135–145)

## 2024-04-18 LAB — CBC
HCT: 31.4 % — ABNORMAL LOW (ref 36.0–46.0)
Hemoglobin: 10.6 g/dL — ABNORMAL LOW (ref 12.0–15.0)
MCH: 31.8 pg (ref 26.0–34.0)
MCHC: 33.8 g/dL (ref 30.0–36.0)
MCV: 94.3 fL (ref 80.0–100.0)
Platelets: 412 K/uL — ABNORMAL HIGH (ref 150–400)
RBC: 3.33 MIL/uL — ABNORMAL LOW (ref 3.87–5.11)
RDW: 15.5 % (ref 11.5–15.5)
WBC: 11.4 K/uL — ABNORMAL HIGH (ref 4.0–10.5)
nRBC: 0.3 % — ABNORMAL HIGH (ref 0.0–0.2)

## 2024-04-18 LAB — GLUCOSE, CAPILLARY
Glucose-Capillary: 214 mg/dL — ABNORMAL HIGH (ref 70–99)
Glucose-Capillary: 207 mg/dL — ABNORMAL HIGH (ref 70–99)
Glucose-Capillary: 281 mg/dL — ABNORMAL HIGH (ref 70–99)
Glucose-Capillary: 296 mg/dL — ABNORMAL HIGH (ref 70–99)

## 2024-04-18 MED ORDER — MIDODRINE HCL 5 MG PO TABS
5.0000 mg | ORAL_TABLET | Freq: Three times a day (TID) | ORAL | Status: DC
  Administered 2024-04-18 – 2024-04-21 (×10): 5 mg via ORAL
  Filled 2024-04-18 (×10): qty 1

## 2024-04-18 NOTE — Progress Notes (Signed)
 Physical Therapy Treatment Patient Details Name: [name] MRN: [id number] DOB: [birth year] Today's Date: 04/18/2024   History of Present Illness 58 y/o female presented to ED on 04/14/24 for AMS and lethargy. Recent ED visit 12/11 for fall with L comminuted fracture of humeral neck. Admitted for DKA and acute metabolic encephalopathy. MRI negative. PMH: HTN, T2DM, NASH, peripheral neuropathy, fibromylagia.    PT Comments  Pt received in bed, dinner just arrived, but pt agreed to OOB. MinA to transition to EOB with heavy reliance on rail w/ HOB raised. (L UE in sling) No dizziness sitting EOB, Sit to stand with ModA, side stepping with single UE support and ModA to safely side step to bedside chair. Will continue to progress mobility acutely and monitor symptoms for Orthostatic Hypotension.    If plan is discharge home, recommend the following: A little help with walking and/or transfers;A lot of help with bathing/dressing/bathroom;Assistance with cooking/housework;Assist for transportation   Can travel by private vehicle     Yes  Equipment Recommendations  Other (comment) (TBD)    Recommendations for Other Services       Precautions / Restrictions Precautions Precautions: Fall Recall of Precautions/Restrictions: Intact Precaution/Restrictions Comments:  (L proximal humerus fx) Required Braces or Orthoses: Sling Restrictions Weight Bearing Restrictions Per Provider Order: Yes LUE Weight Bearing Per Provider Order: Non weight bearing     Mobility  Bed Mobility Overal bed mobility: Needs Assistance Bed Mobility: Supine to Sit     Supine to sit: Min assist, HOB elevated, Used rails     General bed mobility comments:  (Heavy use of bed rail)    Transfers Overall transfer level: Needs assistance Equipment used: 1 person hand held assist Transfers: Sit to/from Stand Sit to Stand: Min assist, Mod assist           General transfer comment:  (Mod/MinA to side step  and transfer bed to chair for dinner. No dizziness in sitting/standing)    Ambulation/Gait               General Gait Details:  (NT this visit)   Stairs             Wheelchair Mobility     Tilt Bed    Modified Rankin (Stroke Patients Only)       Balance Overall balance assessment: Needs assistance Sitting-balance support: Feet supported, No upper extremity supported Sitting balance-Leahy Scale: Fair     Standing balance support: Single extremity supported, During functional activity Standing balance-Leahy Scale: Fair                              Hotel Manager: No apparent difficulties  Cognition Arousal: Alert Behavior During Therapy: WFL for tasks assessed/performed   PT - Cognitive impairments: Awareness, Problem solving                         Following commands: Impaired Following commands impaired: Follows one step commands with increased time    Cueing Cueing Techniques: Verbal cues, Visual cues  Exercises      General Comments General comments (skin integrity, edema, etc.):  (L UE comfortably supported in sling)      Pertinent Vitals/Pain Pain Assessment Pain Assessment: Faces Faces Pain Scale: Hurts little more Pain Location: L shoulder Pain Descriptors / Indicators: Guarding, Grimacing, Sharp Pain Intervention(s): Monitored during session    Home Living  Prior Function            PT Goals (current goals can now be found in the care plan section) Acute Rehab PT Goals Patient Stated Goal: to reduce pain    Frequency    Min 2X/week      PT Plan      Co-evaluation              AM-PAC PT 6 Clicks Mobility   Outcome Measure  Help needed turning from your back to your side while in a flat bed without using bedrails?: A Lot Help needed moving from lying on your back to sitting on the side of a flat bed without using bedrails?: A  Lot Help needed moving to and from a bed to a chair (including a wheelchair)?: A Lot Help needed standing up from a chair using your arms (e.g., wheelchair or bedside chair)?: A Little Help needed to walk in hospital room?: A Lot Help needed climbing 3-5 steps with a railing? : Total 6 Click Score: 12    End of Session Equipment Utilized During Treatment: Gait belt Activity Tolerance: Patient limited by fatigue Patient left: in chair;with call bell/phone within reach;with family/visitor present Nurse Communication: Mobility status PT Visit Diagnosis: Unsteadiness on feet (R26.81);History of falling (Z91.81);Muscle weakness (generalized) (M62.81);Difficulty in walking, not elsewhere classified (R26.2)     Time: 8285-8266 PT Time Calculation (min) (ACUTE ONLY): 19 min  Charges:    $Therapeutic Activity: 8-22 mins PT General Charges $$ ACUTE PT VISIT: 1 Visit                    Darice Bohr, PTA  Darice JAYSON Bohr 04/18/2024, 5:58 PM

## 2024-04-18 NOTE — TOC Progression Note (Addendum)
" °  Transition of Care [facility]) - Progression Note    Patient Details  Name: [name] MRN: [id number] Date of Birth: [birth year]  [facility]) CM/SW Contact  Shasta DELENA Daring, RN Phone Number: 04/18/2024, 11:45 AM  Clinical Narrative:    PASSR complete and under review. FL2 completed and signature requested.  Will provide required info to [employer].    12:47 PM Signed documents uploaded to PASSR review. Will follow for PASSR number. Search for SNF initated. Included facilities in [place] per family request.  Notified patient of expanded bed search.    2:55 PM Spoke with daughter at length. She lives in [place] and prefers her mom get placed closer there. Daughter works full time and has young public affairs consultant. Advised we will extend the search to include facilities in [place], [address], [place] and [place].  Advised the CM tomorrow might have more current info on bed offers.              Expected Discharge Plan and Services                                               Social Drivers of Health (SDOH) Interventions SDOH Screenings   Depression [id number]): Low Risk (11/28/2021)  Tobacco Use: Medium Risk (04/15/2024)    Readmission Risk Interventions    10/28/2021   10:28 AM  Readmission Risk Prevention Plan  Post Dischage Appt Complete  Medication Screening Complete  Transportation Screening Complete    "

## 2024-04-18 NOTE — TOC CM/SW Note (Deleted)
 30

## 2024-04-18 NOTE — Consult Note (Addendum)
 "                                                                ORTHOPAEDIC CONSULTATION  REQUESTING PHYSICIAN: Marsa Edelman, DO  Chief Complaint:   Left shoulder pain secondary to proximal humerus fracture  History of Present Illness: [name] is a 58 y.o. female with multiple medical problems including coronary artery disease, fully controlled diabetes, fibromyalgia, ischemic cardiomyopathy, dyslipidemia, and fibromyalgia who normally lives independently.  Apparently, the patient fell in her home on 03/30/2024.  She presented to the emergency room where x-rays demonstrated a minimally displaced proximal humerus fracture.  The patient was placed into a shoulder immobilizer and referred to orthopedics.  She followed up with [facility] last week where apparently x-rays showed satisfactory maintenance of fracture alignment and so continued nonsurgical treatment was recommended.  The patient was admitted on 04/14/2024 with altered mental status changes due to diabetic ketoacidosis.  Over the past few days, she has cleared mentally.  However, she continues to complain of left shoulder pain, but denies any numbness or paresthesias down her arm to her hand.  Repeat x-ray shows mild worsening of the overall fracture alignment.  The patient and her family have requested additional conversation with orthopedics regarding the best course of care for this left proximal humerus fracture.  Therefore, orthopedic consultation has been requested.  Past Medical History:  Diagnosis Date   Anxiety    CAD (coronary artery disease)    a.  Inferior STEMI / CAD s/p DES to prox RCA 03/03/14, staged cath 03/05/14 for LAD showing significantly improved stenosis of mid LAD with resolution of prior thrombus.   Clostridium difficile colitis    Diabetes mellitus    Dyslipidemia    Fibromyalgia    GERD (gastroesophageal reflux disease)    H/O hiatal hernia    Ischemic cardiomyopathy    a. EF 45-50% by echo  03/04/14 with suspected mild acute diastolic CHF s/p dose of IV Lasix .   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   Sinus bradycardia    a. During 02/2014 admit, not on BB due to this.   Upper respiratory infection 2/15   states has dry non productive cough since then- tickle in my throat no fever   Past Surgical History:  Procedure Laterality Date   ABDOMINAL HYSTERECTOMY     partial   CARDIAC CATHETERIZATION  03/05/14   improved LAD   COLONOSCOPY  12/07/2011   Rourk-normal rectum,, colon and terminal ileum   CORONARY ANGIOPLASTY WITH STENT PLACEMENT  03/03/14   DES to RCA,  ulcerated plaque in LAD   ESOPHAGOGASTRODUODENOSCOPY  08/27/2011   Rourk-reactive gastropathy, normal, patent esophagus. Stomach empty. Multiple linear antral erosions. No ulcer or infiltrating process. Small hiatal hernia. Patent pylorus. Normal first and second portion of the duodenum   IR RADIOLOGIST EVAL & MGMT  11/11/2021   LEFT HEART CATH Bilateral 03/03/2014   Procedure: LEFT HEART CATH;  Surgeon: Peter M Jordan, MD;  Location: [facility] CATH LAB;  Service: Cardiovascular;  Laterality: Bilateral;   PERCUTANEOUS CORONARY STENT INTERVENTION (PCI-S) N/A 03/05/2014   Procedure: PERCUTANEOUS CORONARY STENT INTERVENTION (PCI-S);  Surgeon: Debby DELENA Sor, MD;  Location: [facility] CATH LAB;  Service: Cardiovascular;  Laterality: N/A;   SHOULDER ARTHROSCOPY WITH OPEN ROTATOR CUFF REPAIR Left 03/02/2013   Procedure: LEFT SHOULDER ARTHROSCOPY, SUBACROMIAL DECOMPRESSION, MANIPULATION UNDER ANESTHESIA;  Surgeon: Reyes JAYSON Billing, MD;  Location: [facility] ORS;  Service: Orthopedics;  Laterality: Left;   SHOULDER OPEN ROTATOR CUFF REPAIR Left 07/27/2013   Procedure: LEFT MINI OPEN ROTATOR CUFF REPAIR ;  Surgeon: Reyes JAYSON Billing, MD;  Location: [facility] ORS;  Service: Orthopedics;  Laterality: Left;   SPLENECTOMY     TONSILLECTOMY      Social History   Socioeconomic History   Marital status: Legally Separated    Spouse name: Not on file   Number of children: 2   Years of education: Not on file   Highest education level: Not on file  Occupational History   Occupation: homemaker  Tobacco Use   Smoking status: Former    Current packs/day: 0.00    Average packs/day: 0.3 packs/day for 30.0 years (7.5 ttl pk-yrs)    Types: Cigarettes    Start date: 04/20/1976    Quit date: 04/20/2006    Years since quitting: 18.0   Smokeless tobacco: Never  Substance and Sexual Activity   Alcohol use: Not Currently    Comment: rarely   Drug use: No   Sexual activity: Yes    Birth control/protection: None, Surgical  Other Topics Concern   Not on file  Social History Narrative   Lives w/ husband & step daughter   Also has grandchildren from her son & daughter   Social Drivers of Health   Tobacco Use: Medium Risk (04/15/2024)   Patient History    Smoking Tobacco Use: Former    Smokeless Tobacco Use: Never    Passive Exposure: Not on Actuary Strain: Not on file  Food Insecurity: Not on file  Transportation Needs: Not on file  Physical Activity: Not on file  Stress: Not on file  Social Connections: Not on file  Depression (PHQ2-9): Low Risk (11/28/2021)   Depression (PHQ2-9)    PHQ-2 Score: 0  Alcohol Screen: Not on file  Housing: Not on file  Utilities: Not on file  Health Literacy: Not on file   Family History  Problem Relation Age of Onset   Arthritis Mother    Diabetes Father    Kidney disease Father    Colon cancer Neg Hx    Allergies[1] Prior to Admission medications  Medication Sig Start Date End Date Taking? Authorizing Provider  acetaminophen  (TYLENOL ) 325 MG tablet Take 2 tablets (650 mg total) by mouth every 6 (six) hours as needed for mild pain (or Fever >/= 101). 10/28/21  Yes Rizwan, True, MD  albuterol  (VENTOLIN  HFA) 108 (90 Base) MCG/ACT inhaler Inhale 2 puffs into the lungs every  4 (four) hours as needed for shortness of breath or wheezing. 05/27/21  Yes [provider]  Cholecalciferol (VITAMIN D3) 1.25 MG (50000 UT) CAPS Take 50,000 Units by mouth every Sunday. 10/02/21  Yes [provider]  DULoxetine  (CYMBALTA ) 60 MG capsule Take 60 mg by mouth at bedtime. 09/16/21  Yes [provider]  insulin  aspart (NOVOLOG  FLEXPEN) 100 UNIT/ML FlexPen Inject 1-15 Units into the skin 3 (three) times daily with meals. CBG 121 - 150: 2 units  CBG 151 - 200: 3 units  CBG 201 - 250: 5 units  CBG 251 - 300: 8 units  CBG 301 - 350: 11 units  CBG 351 -  400: 15 units 10/28/21  Yes Rizwan, Saima, MD  Insulin  Glargine (BASAGLAR  KWIKPEN) 100 UNIT/ML Inject 35 Units into the skin at bedtime. 10/28/21  Yes Rizwan, True, MD  JARDIANCE 10 MG TABS tablet Take 10 mg by mouth daily. 02/08/24  Yes [provider]  lisinopril  (ZESTRIL ) 10 MG tablet Take 10 mg by mouth daily. 09/16/21  Yes [provider]  meloxicam (MOBIC) 15 MG tablet Take 15 mg by mouth daily.   Yes [provider]  metFORMIN  (GLUCOPHAGE ) 1000 MG tablet Take 1,000 mg by mouth 2 (two) times daily. 09/07/21  Yes [provider]  pregabalin  (LYRICA ) 150 MG capsule Take 150 mg by mouth 3 (three) times daily. 03/20/24  Yes [provider]  Probiotic Product (PROBIOTIC PO) Take 1 capsule by mouth daily.   Yes [provider]  traMADol  (ULTRAM ) 50 MG tablet Take 1 tablet (50 mg total) by mouth every 6 (six) hours as needed. 03/30/24 03/30/25 Yes Tamesha Ellerbrock, Jenna E, PA-C  atorvastatin  (LIPITOR ) 80 MG tablet Take 80 mg by mouth at bedtime.    [provider]  pantoprazole  (PROTONIX ) 40 MG tablet Take 1 tablet (40 mg total) by mouth daily. 10/29/21 11/28/21  Rizwan, Saima, MD   No results found.  Positive ROS: All other systems have been reviewed and were otherwise negative with the exception of those mentioned in the HPI and as above.  Physical Exam: General:   Alert, no acute distress Psychiatric:  Patient is competent for consent with normal mood and affect   Cardiovascular:  No pedal edema Respiratory:  No wheezing, non-labored breathing GI:  Abdomen is soft and non-tender Skin:  No lesions in the area of chief complaint Neurologic:  Sensation intact distally Lymphatic:  No axillary or cervical lymphadenopathy  Orthopedic Exam:  Orthopedic examination is limited to the left shoulder and upper extremity.  Skin inspection around the left shoulder is notable for swelling and resolving ecchymosis, but otherwise is unremarkable.  No erythema, abrasions, or other skin abnormalities are identified.  She has moderate tenderness to palpation over the anterior and lateral aspects of the shoulder.  She has more severe pain with any attempted active or passive motion of the shoulder.  She is grossly neurovascularly intact to the left upper extremity and hand, including intact sensation to the axillary and musculocutaneous nerve distributions.  X-rays:  Recent AP and lateral x-rays of the left humerus are available for review and have been reviewed by myself.  These films demonstrate a mildly angulated/displaced fracture of the left proximal humerus shaft.  Overall alignment is satisfactory.  The glenohumeral joint is properly maintained.  No significant degenerative changes or lytic lesions are identified.  Assessment: Mildly displaced extra-articular left proximal humerus fracture.  Plan: The treatment options have been discussed with the patient.  Based on her recent left shoulder x-rays, I feel that the fracture still is sufficiently well aligned that this injury may continue to be adequately managed nonsurgically with a high likelihood of healing and providing excellent function.  The patient states that she would prefer to avoid surgery if possible.  Per the patient's request, I did try to contact the patient's daughter but the call went directly to  voicemail and her mailbox was full so I could not leave her a message.  The shoulder immobilizer that the patient is presently wearing does not appear to be fitting properly.  Therefore, she is fitted with a somewhat larger shoulder immobilizer to better accommodate the swelling in her  upper arm.  The patient states that this does make her arm feel more comfortable.  She may continue to be mobilized with physical therapy, avoiding any weightbearing through the left upper extremity or hand.  She may use the hand for limited activities as symptoms permit, such as holding her phone, holding a remote, or holding a cup of coffee.   Thank you for asked me to participate in the care of this most pleasant yet unfortunate woman.  I will be happy to follow her with you.   DOROTHA Reyes Maltos, MD  Beeper #:  [phone]  04/18/2024 12:35 PM   Addendum: After several attempts, I was able to contact the patient's daughter and discussed the potential benefits and drawbacks of surgical intervention versus nonsurgical intervention.  The patient's daughter states that she is quite comfortable with continuing with non-surgical treatment for her mother's shoulder injury.  She has also stated that she would like to continue care with myself and [employer], so arranges will be made for her to follow-up with our office in 2 to 3 weeks.   DOROTHA Reyes Maltos, MD  Beeper #:  [phone]  04/18/2024 16:09 PM    [1]  Allergies Allergen Reactions   Aspartame And Phenylalanine Anaphylaxis, Shortness Of Breath and Swelling    Throat swells  ANY ARTIFICAL SWEETNERS   Hydromorphone  Hives   Sucralose Rash and Dermatitis    Pt.s face broke out in rash after drinking Breeza.  Pt. Claims it is from the artificial sweetener.  sucralose   Toradol  [Ketorolac  Tromethamine ] Other (See Comments)    Face & neck flushed red & pt felt very hot after IV adm.   Codeine Swelling   Dilaudid  [Hydromorphone  Hcl]  Itching   Hydrocodone  Itching   Oxycodone  Itching   "

## 2024-04-18 NOTE — NC FL2 (Signed)
 " [address] [place]  MEDICAID [id number] LEVEL OF CARE FORM     IDENTIFICATION  Patient Name: [name] Birthdate: Jul 26, 1965 Sex: female Admission Date (Current Location): 04/14/2024  [place] and [place] Number:  Chiropodist and Address:  [facility], [address], [place], [place] [address]      Provider Number: [id number]  Attending Physician Name and Address:  Marsa Edelman, DO  Relative Name and Phone Number:       Current Level of Care: SNF Recommended Level of Care: Skilled Nursing Facility Prior Approval Number:    Date Approved/Denied:   PASRR Number: pending  Discharge Plan: SNF    Current Diagnoses: Patient Active Problem List   Diagnosis Date Noted   DKA (diabetic ketoacidosis) (HCC) 04/14/2024   Uncontrolled type 2 diabetes mellitus with hyperglycemia, with long-term current use of insulin  (HCC) 10/28/2021   Obesity (BMI 30-39.9) 10/28/2021   Anemia of infection and chronic disease 10/28/2021   Hypoalbuminemia 10/28/2021   Acute HFrEF (heart failure with reduced ejection fraction) (HCC) 10/28/2021   E coli bacteremia    Acute respiratory failure with hypoxia (HCC)    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   Thrombocytopenia 10/05/2021   AKI (acute kidney injury) 10/05/2021   Elevated troponin 10/05/2021   Acidosis 10/05/2021   Cirrhosis (HCC) 10/05/2021   Septic shock (HCC) 10/05/2021   Severe sepsis (HCC)    Urinary tract infection without hematuria    Depression 09/21/2014   Cough 09/19/2014   Dyspnea 05/10/2014   Angina decubitus 03/28/2014   Memory loss 03/28/2014   Bleeding gums 03/28/2014   Sinus bradycardia    Ischemic cardiomyopathy    CAD (coronary artery disease)    Hypokalemia 03/05/2014   Essential hypertension    ST elevation myocardial infarction (STEMI) involving right coronary artery with complication (HCC) 03/03/2014   Left rotator cuff tear 07/27/2013   Complete rotator cuff tear of left shoulder  07/27/2013   Otalgia 05/25/2013   Rotator cuff tear, non-traumatic 03/02/2013   Weakness generalized 09/06/2012   Fall 09/06/2012   Adjustment disorder with depressed mood 09/06/2012   Dizziness and giddiness 07/25/2012   Fibromyalgia 05/12/2012   Asplenia 05/12/2012   Diarrhea 01/07/2012   Nonalcoholic fatty liver disease 01/07/2012   GERD (gastroesophageal reflux disease) 09/15/2011   Hyperlipidemia    Lateral epicondylitis  of elbow 12/17/2008    Orientation RESPIRATION BLADDER Height & Weight     Self, Time, Situation, Place  Normal Incontinent Weight: 73.1 kg Height:  5' 5 (165.1 cm)  BEHAVIORAL SYMPTOMS/MOOD NEUROLOGICAL BOWEL NUTRITION STATUS   (none)  (none) Continent Diet (carb modified)  AMBULATORY STATUS COMMUNICATION OF NEEDS Skin   Limited Assist Verbally Other (Comment) (redness to buttocks and shoulders)                       Personal Care Assistance Level of Assistance  Bathing, Feeding, Dressing Bathing Assistance: Limited assistance Feeding assistance: Independent Dressing Assistance: Maximum assistance     Functional Limitations Info  Sight, Hearing, Speech Sight Info: Impaired Hearing Info: Adequate Speech Info: Adequate    SPECIAL CARE FACTORS FREQUENCY  PT (By licensed PT), OT (By licensed OT)     PT Frequency: 5x week OT Frequency: 5x week            Contractures Contractures Info: Not present    Additional Factors Info  Code Status, Allergies Code Status Info: Full Allergies Info: Aspartame And Phenylalanine, Hydromorphone , Sucralose, Toradol  (Ketorolac  Tromethamine ), Codeine,  Dilaudid  (Hydromorphone  Hcl), Hydrocodone , Oxycodone            Current Medications (04/18/2024):  This is the current hospital active medication list Current Facility-Administered Medications  Medication Dose Route Frequency Provider Last Rate Last Admin   atorvastatin  (LIPITOR ) tablet 80 mg  80 mg Oral QHS Alexander, Natalie, DO   80 mg at 04/17/24  2129   cefTRIAXone  (ROCEPHIN ) 1 g in sodium chloride  0.9 % 100 mL IVPB  1 g Intravenous Q24H Alexander, Natalie, DO 200 mL/hr at 04/17/24 1627 1 g at 04/17/24 1627   Chlorhexidine  Gluconate Cloth 2 % PADS 6 each  6 each Topical Q0600 Fernand Prost, MD   6 each at 04/18/24 0540   dextrose  50 % solution 0-50 mL  0-50 mL Intravenous PRN Ernest Ronal BRAVO, MD       DULoxetine  (CYMBALTA ) DR capsule 60 mg  60 mg Oral QHS Alexander, Natalie, DO   60 mg at 04/17/24 2129   enoxaparin  (LOVENOX ) injection 40 mg  40 mg Subcutaneous Q24H Alexander, Natalie, DO   40 mg at 04/17/24 2024   gabapentin  (NEURONTIN ) capsule 300 mg  300 mg Oral TID Alexander, Natalie, DO   300 mg at 04/18/24 9085   insulin  aspart (novoLOG ) injection 0-5 Units  0-5 Units Subcutaneous QHS Alexander, Natalie, DO   3 Units at 04/17/24 2130   insulin  aspart (novoLOG ) injection 0-9 Units  0-9 Units Subcutaneous TID WC Alexander, Natalie, DO   3 Units at 04/18/24 9085   insulin  aspart (novoLOG ) injection 5 Units  5 Units Subcutaneous TID WC Alexander, Natalie, DO   5 Units at 04/18/24 9083   insulin  glargine (LANTUS ) injection 25 Units  25 Units Subcutaneous Daily Alexander, Natalie, DO   25 Units at 04/18/24 0915   midodrine  (PROAMATINE ) tablet 2.5 mg  2.5 mg Oral TID WC Alexander, Natalie, DO   2.5 mg at 04/17/24 2024   naproxen  (NAPROSYN ) tablet 250 mg  250 mg Oral TID WC Alexander, Natalie, DO   250 mg at 04/18/24 9085   ondansetron  (ZOFRAN ) tablet 4 mg  4 mg Oral Q6H PRN Fernand Prost, MD       Or   ondansetron  (ZOFRAN ) injection 4 mg  4 mg Intravenous Q6H PRN Khan, Ghalib, MD       Oral care mouth rinse  15 mL Mouth Rinse PRN Marsa Edelman, DO       pantoprazole  (PROTONIX ) EC tablet 40 mg  40 mg Oral Daily Alexander, Natalie, DO   40 mg at 04/18/24 9085   senna-docusate (Senokot-S) tablet 1 tablet  1 tablet Oral QHS PRN Fernand Prost, MD       sodium chloride  flush (NS) 0.9 % injection 3 mL  3 mL Intravenous Q12H Khan, Ghalib, MD   3 mL  at 04/18/24 9078   traMADol  (ULTRAM ) tablet 50 mg  50 mg Oral Q6H PRN Alexander, Natalie, DO   50 mg at 04/18/24 9455     Discharge Medications: Please see discharge summary for a list of discharge medications.  Relevant Imaging Results:  Relevant Lab Results:   Additional Information SS#: [id number]  Shasta DELENA Daring, RN     "

## 2024-04-18 NOTE — TOC CM/SW Note (Signed)
To Whom It May Concern:  Please be advised that the above-named patient will require a short-term nursing home stay - anticipated 30 days or less for rehabilitation and strengthening.  The plan is for return home.  

## 2024-04-18 NOTE — Progress Notes (Signed)
 " PROGRESS NOTE    RUTHIE BERCH   FMW:[id number] DOB: 1966-01-17  DOA: 04/14/2024 Date of Service: 04/18/2024 which is hospital day 4  PCP: [facility] course / significant events:   HPI: Caterine KETRA DUCHESNE is a 58 y.o. year old female with medical history of hypertension, hyperlipidemia, type 2 diabetes presenting to the ED with confusion and somnolence.  Patient was brought here by her mother who states patient has not had significant p.o. intake in the last 2 days.    12/26: On arrival to the ED patient was noted to be HDS stable.  Lab work and imaging obtained.  CBC with leukocytosis at 16.7, mild anemia at 11.1 significant thrombocytosis at 806.  CMP with severe hyperglycemia at 957 with significant anion gap metabolic acidosis with bicarb at 10 and AKI and hypercalcium which are likely mediated by dehydration.  Magnesium  was checked and 2.7.  Serum osmolality elevated at 373, BHB greater than 8.  VBG shows severe acidemia at 7.1 4/32/43.  TSH checked and less than 0.100 likely in setting of acute illness.  UA shows nitrite positive and bacteria.  Urine culture pending.  Ammonia level normal.  Patient's position consistent with DKA so DKA protocol initiated.  Given presentation, [facility] contacted for admission. DKA protocol in stepdown unit 12/27: DKA resolved pt still somnolent though she is following commands, c/o shoulder pain, not answering other questions. Known humerus fx - confirmed w/ ortho on call Dr Lorelle to review XR - shows some displacement but nothing to do operatively at this time. MRI brain given persistent encephalopathy, question TBI / CVA. Echocardiogram ordered to eval possible syncope cause for falling but suspect DKA had been developing for some time and causing weakness/fall.  12/28: much more alert today, conversational, oriented. Cannot recall home dose insulin  or other meds, not sure if she was taking insulin  per instructions.  Adjusting insulin  as needed. 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 Daughter request discuss w/ ortho re humerus fracture, will try to reach out to primary orthopedist 12/29: Adjusting insulin , awaiting SNF rehab placement, patient otherwise stable/doing well 12/30: Remains fairly hypotensive, some orthostatic hypotension.  Continue midodrine .  Appreciate Ortho seeing patient, remain non-operative.      Consultants:  Orthopedic surgery  Procedures/Surgeries: none      ASSESSMENT & PLAN:   DKA - resolved AGMA - resolved  Secondary to insulin  non-adherence  insulin  drip --> subcu and doing well ,adjust as needed Low dose basal and low dose SSI increased now that not NPO   Acute metabolic encephalopathy - resolved  monitor  Multiple falls at home Likely weakness/illness related to DKA Possible other syncope etiology Question TBI / concussion Suspect underlying hypotension /orthostatic hypotension Echo no concrns Telemetry can DC  AKI - resolved/improved w/ hydration Monitor BMP  UTI -Klebsiella resistant to nitrofurantoin and ampicillin Rocephin  5 days total  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  Needs outpatient GI follow up.   Humerus fracture Maintain sling.  outpatient ortho follow up -Ortho consulted here per family request, remains nonoperative.  See consult note 04/18/2024 Pain control    Essential hypertension  Orthostatic hypotension here Hypotension here, question mild autonomic dysfunction Blood pressure has been low here Question overmedication outpatient on lisinopril  and HCT outpatient.  hold given AKI. IV antihypertensive prn  Added midodrine  due to persistent low BP   Hyperlipidemia statin   Fibromyalgia Chronic pain  Resume home SNRI, tramadol   GERD  PPI     overweight based on BMI: Body mass index is 27.1 kg/m.SABRA Significantly low or high BMI is associated with higher medical risk.   Underweight - under 18  overweight - 25 to 29 obese - 30 or more Class 1 obesity: BMI of 30.0 to 34 Class 2 obesity: BMI of 35.0 to 39 Class 3 obesity: BMI of 40.0 to 49 Super Morbid Obesity: BMI 50-59 Super-super Morbid Obesity: BMI 60+ Healthy nutrition and physical activity advised as adjunct to other disease management and risk reduction treatments    DVT prophylaxis: heparin  w/ AKI IV fluids: none Nutrition: carb det  Central lines / other devices: none  Code Status: FULL CODE ACP documentation reviewed:  none on file in [facility]  TOC needs: SNF rehab Medical barriers to dispo: none          Subjective / Brief ROS:  Patient remains alert Only complaint at this time is arm pain Reports some dizziness, cannot recall if any lightheadedness on standing is associated with any falls that she had at home I was just falling a lot and is not really able to give much more of description   Family Communication: Daughter is at bedside on rounds     Objective Findings:  Vitals:   04/18/24 0243 04/18/24 0539 04/18/24 0756 04/18/24 1453  BP: 118/60  112/67 (!) 82/51  Pulse: (!) 55  (!) 58 66  Resp: 18  18 17   Temp: (!) 97.5 F (36.4 C) 98.1 F (36.7 C) 98.4 F (36.9 C) 97.9 F (36.6 C)  TempSrc: Oral Oral Oral   SpO2: 95%  100% 100%  Weight:      Height:        Intake/Output Summary (Last 24 hours) at 04/18/2024 1820 Last data filed at 04/18/2024 1406 Gross per 24 hour  Intake 480 ml  Output 1200 ml  Net -720 ml   Filed Weights   04/14/24 1830 04/16/24 0345 04/17/24 0951  Weight: 69.4 kg 72.6 kg 73.1 kg    Examination:  Physical Exam Constitutional:      General: She is not in acute distress.    Appearance: She is not ill-appearing.  Cardiovascular:     Rate and Rhythm: Normal rate and regular rhythm.  Pulmonary:     Effort: Pulmonary effort is normal. No respiratory distress.     Breath sounds: No wheezing.  Abdominal:     General: Abdomen is  flat. Bowel sounds are normal.  Musculoskeletal:     Right lower leg: No edema.     Left lower leg: No edema.  Neurological:     Mental Status: She is alert and oriented to person, place, and time.          Scheduled Medications:   atorvastatin   80 mg Oral QHS   Chlorhexidine  Gluconate Cloth  6 each Topical Q0600   DULoxetine   60 mg Oral QHS   enoxaparin  (LOVENOX ) injection  40 mg Subcutaneous Q24H   gabapentin   300 mg Oral TID   insulin  aspart  0-5 Units Subcutaneous QHS   insulin  aspart  0-9 Units Subcutaneous TID WC   insulin  aspart  5 Units Subcutaneous TID WC   insulin  glargine  25 Units Subcutaneous Daily   midodrine   5 mg Oral TID WC   naproxen   250 mg Oral TID WC   pantoprazole   40 mg Oral Daily   sodium chloride  flush  3 mL Intravenous Q12H    Continuous Infusions:  cefTRIAXone  (  ROCEPHIN )  IV 1 g (04/18/24 1607)    PRN Medications:  dextrose , ondansetron  **OR** ondansetron  (ZOFRAN ) IV, mouth rinse, senna-docusate, traMADol   Antimicrobials from admission:  Anti-infectives (From admission, onward)    Start     Dose/Rate Route Frequency Ordered Stop   04/15/24 1600  cefTRIAXone  (ROCEPHIN ) 1 g in sodium chloride  0.9 % 100 mL IVPB        1 g 200 mL/hr over 30 Minutes Intravenous Every 24 hours 04/15/24 1509 04/20/24 1559           Data Reviewed:  I have personally reviewed the following...  CBC: Recent Labs  Lab 04/14/24 1311 04/15/24 0318 04/16/24 0057 04/17/24 0646 04/18/24 0513  WBC 16.7* 19.5* 14.1* 12.6* 11.4*  NEUTROABS 14.7*  --   --   --   --   HGB 11.1* 9.8* 9.3* 10.3* 10.6*  HCT 34.6* 28.9* 27.9* 30.3* 31.4*  MCV 98.9 92.0 95.5 94.1 94.3  PLT 806* 686* 560* 478* 412*   Basic Metabolic Panel: Recent Labs  Lab 04/14/24 1311 04/14/24 1616 04/14/24 1956 04/14/24 2208 04/15/24 0318 04/15/24 0632 04/15/24 1548 04/16/24 0057 04/17/24 0646 04/18/24 0513  NA 140   < > 147* 151*   < > 145 147* 143 138 138  K 4.5   < > 3.2* 3.0*    < > 4.0 3.6 4.1 4.0 4.0  CL 100   < > 111 113*   < > 110 112* 108 105 103  CO2 10*   < > 21* 24   < > 29 27 27 24 26   GLUCOSE 957*   < > 467* 283*   < > 218* 95 241* 271* 233*  BUN 27*   < > 23* 21*   < > 17 14 13 16 17   CREATININE 1.60*   < > 1.26* 1.14*   < > 1.10* 1.01* 1.04* 0.92 0.95  CALCIUM  10.4*   < > 9.9 10.0   < > 9.1 8.7* 8.7* 9.0 8.9  MG 2.7*  --  2.3  --   --   --   --   --   --   --   PHOS  --   --   --  <1.0*  --  2.6  --   --   --   --    < > = values in this interval not displayed.   GFR: Estimated Creatinine Clearance: 63.8 mL/min (by C-G formula based on SCr of 0.95 mg/dL). Liver Function Tests: Recent Labs  Lab 04/14/24 1311  AST <10*  ALT 12  ALKPHOS 208*  BILITOT 0.3  PROT 7.0  ALBUMIN  3.6   No results for input(s): LIPASE, AMYLASE in the last 168 hours. Recent Labs  Lab 04/14/24 1311  AMMONIA <13   Coagulation Profile: No results for input(s): INR, PROTIME in the last 168 hours. Cardiac Enzymes: Recent Labs  Lab 04/14/24 1311  CKTOTAL 69   BNP (last 3 results) No results for input(s): PROBNP in the last 8760 hours. HbA1C: No results for input(s): HGBA1C in the last 72 hours.  CBG: Recent Labs  Lab 04/17/24 1716 04/17/24 2124 04/18/24 0751 04/18/24 1143 04/18/24 1625  GLUCAP 182* 289* 214* 207* 281*   Lipid Profile: No results for input(s): CHOL, HDL, LDLCALC, TRIG, CHOLHDL, LDLDIRECT in the last 72 hours. Thyroid  Function Tests: No results for input(s): TSH, T4TOTAL, FREET4, T3FREE, THYROIDAB in the last 72 hours.  Anemia Panel: No results for input(s): VITAMINB12, FOLATE, FERRITIN, TIBC, IRON,  RETICCTPCT in the last 72 hours. Most Recent Urinalysis On File:     Component Value Date/Time   COLORURINE STRAW (A) 04/14/2024 1311   APPEARANCEUR CLEAR (A) 04/14/2024 1311   LABSPEC 1.027 04/14/2024 1311   PHURINE 5.0 04/14/2024 1311   GLUCOSEU >=500 (A) 04/14/2024 1311   HGBUR NEGATIVE  04/14/2024 1311   BILIRUBINUR NEGATIVE 04/14/2024 1311   KETONESUR 20 (A) 04/14/2024 1311   PROTEINUR NEGATIVE 04/14/2024 1311   UROBILINOGEN 0.2 01/19/2012 2019   NITRITE POSITIVE (A) 04/14/2024 1311   LEUKOCYTESUR NEGATIVE 04/14/2024 1311   Sepsis Labs: @LABRCNTIP (procalcitonin:4,lacticidven:4) Microbiology: Recent Results (from the past 240 hours)  Resp panel by RT-PCR (RSV, Flu A&B, Covid) Anterior Nasal Swab     Status: None   Collection Time: 04/14/24  1:11 PM   Specimen: Anterior Nasal Swab  Result Value Ref Range Status   SARS Coronavirus 2 by RT PCR NEGATIVE NEGATIVE Final    Comment: (NOTE) SARS-CoV-2 target nucleic acids are NOT DETECTED.  The SARS-CoV-2 RNA is generally detectable in upper respiratory specimens during the acute phase of infection. The lowest concentration of SARS-CoV-2 viral copies this assay can detect is 138 copies/mL. A negative result does not preclude SARS-Cov-2 infection and should not be used as the sole basis for treatment or other patient management decisions. A negative result may occur with  improper specimen collection/handling, submission of specimen other than nasopharyngeal swab, presence of viral mutation(s) within the areas targeted by this assay, and inadequate number of viral copies(<138 copies/mL). A negative result must be combined with clinical observations, patient history, and epidemiological information. The expected result is Negative.  Fact Sheet for Patients:  bloggercourse.com  Fact Sheet for Healthcare Providers:  seriousbroker.it  This test is no t yet approved or cleared by the [place]  FDA and  has been authorized for detection and/or diagnosis of SARS-CoV-2 by FDA under an Emergency Use Authorization (EUA). This EUA will remain  in effect (meaning this test can be used) for the duration of the COVID-19 declaration under Section 564(b)(1) of the Act,  21 U.S.C.section 360bbb-3(b)(1), unless the authorization is terminated  or revoked sooner.       Influenza A by PCR NEGATIVE NEGATIVE Final   Influenza B by PCR NEGATIVE NEGATIVE Final    Comment: (NOTE) The Xpert Xpress SARS-CoV-2/FLU/RSV plus assay is intended as an aid in the diagnosis of influenza from Nasopharyngeal swab specimens and should not be used as a sole basis for treatment. Nasal washings and aspirates are unacceptable for Xpert Xpress SARS-CoV-2/FLU/RSV testing.  Fact Sheet for Patients: bloggercourse.com  Fact Sheet for Healthcare Providers: seriousbroker.it  This test is not yet approved or cleared by the [place]  FDA and has been authorized for detection and/or diagnosis of SARS-CoV-2 by FDA under an Emergency Use Authorization (EUA). This EUA will remain in effect (meaning this test can be used) for the duration of the COVID-19 declaration under Section 564(b)(1) of the Act, 21 U.S.C. section 360bbb-3(b)(1), unless the authorization is terminated or revoked.     Resp Syncytial Virus by PCR NEGATIVE NEGATIVE Final    Comment: (NOTE) Fact Sheet for Patients: bloggercourse.com  Fact Sheet for Healthcare Providers: seriousbroker.it  This test is not yet approved or cleared by the [place]  FDA and has been authorized for detection and/or diagnosis of SARS-CoV-2 by FDA under an Emergency Use Authorization (EUA). This EUA will remain in effect (meaning this test can be used) for the duration of the COVID-19 declaration under Section 564(b)(1)  of the Act, 21 U.S.C. section 360bbb-3(b)(1), unless the authorization is terminated or revoked.  Performed at [facility], [address] [place], [place] [address]   Urine Culture (for pregnant, neutropenic or urologic patients or patients with an indwelling urinary catheter)     Status: Abnormal    Collection Time: 04/14/24  1:11 PM   Specimen: Urine, Clean Catch  Result Value Ref Range Status   Specimen Description   Final    URINE, CLEAN CATCH Performed at [facility], [address], [place], [place] [address]    Special Requests   Final    NONE Performed at [facility], [address], [place], [place] [address]    Culture >=100,000 COLONIES/mL KLEBSIELLA PNEUMONIAE (A)  Final   Report Status 04/16/2024 FINAL  Final   Organism ID, Bacteria KLEBSIELLA PNEUMONIAE (A)  Final      Susceptibility   Klebsiella pneumoniae - MIC*    AMPICILLIN RESISTANT Resistant     CEFAZOLIN  (URINE) Value in next row Sensitive      <=1 SENSITIVEThis is a modified FDA-approved test that has been validated and its performance characteristics determined by the reporting laboratory.  This laboratory is certified under the Clinical Laboratory Improvement Amendments CLIA as qualified to perform high complexity clinical laboratory testing.    CEFEPIME Value in next row Sensitive      <=1 SENSITIVEThis is a modified FDA-approved test that has been validated and its performance characteristics determined by the reporting laboratory.  This laboratory is certified under the Clinical Laboratory Improvement Amendments CLIA as qualified to perform high complexity clinical laboratory testing.    ERTAPENEM Value in next row Sensitive      <=1 SENSITIVEThis is a modified FDA-approved test that has been validated and its performance characteristics determined by the reporting laboratory.  This laboratory is certified under the Clinical Laboratory Improvement Amendments CLIA as qualified to perform high complexity clinical laboratory testing.    CEFTRIAXONE  Value in next row Sensitive      <=1 SENSITIVEThis is a modified FDA-approved test that has been validated and its performance characteristics determined by the reporting laboratory.  This laboratory is certified under the Clinical Laboratory  Improvement Amendments CLIA as qualified to perform high complexity clinical laboratory testing.    CIPROFLOXACIN  Value in next row Sensitive      <=1 SENSITIVEThis is a modified FDA-approved test that has been validated and its performance characteristics determined by the reporting laboratory.  This laboratory is certified under the Clinical Laboratory Improvement Amendments CLIA as qualified to perform high complexity clinical laboratory testing.    GENTAMICIN Value in next row Sensitive      <=1 SENSITIVEThis is a modified FDA-approved test that has been validated and its performance characteristics determined by the reporting laboratory.  This laboratory is certified under the Clinical Laboratory Improvement Amendments CLIA as qualified to perform high complexity clinical laboratory testing.    NITROFURANTOIN Value in next row Resistant      <=1 SENSITIVEThis is a modified FDA-approved test that has been validated and its performance characteristics determined by the reporting laboratory.  This laboratory is certified under the Clinical Laboratory Improvement Amendments CLIA as qualified to perform high complexity clinical laboratory testing.    TRIMETH/SULFA Value in next row Sensitive      <=1 SENSITIVEThis is a modified FDA-approved test that has been validated and its performance characteristics determined by the reporting laboratory.  This laboratory is certified under the Clinical Laboratory Improvement Amendments  CLIA as qualified to perform high complexity clinical laboratory testing.    AMPICILLIN/SULBACTAM Value in next row Sensitive      <=1 SENSITIVEThis is a modified FDA-approved test that has been validated and its performance characteristics determined by the reporting laboratory.  This laboratory is certified under the Clinical Laboratory Improvement Amendments CLIA as qualified to perform high complexity clinical laboratory testing.    PIP/TAZO Value in next row Sensitive      <=4  SENSITIVEThis is a modified FDA-approved test that has been validated and its performance characteristics determined by the reporting laboratory.  This laboratory is certified under the Clinical Laboratory Improvement Amendments CLIA as qualified to perform high complexity clinical laboratory testing.    MEROPENEM  Value in next row Sensitive      <=4 SENSITIVEThis is a modified FDA-approved test that has been validated and its performance characteristics determined by the reporting laboratory.  This laboratory is certified under the Clinical Laboratory Improvement Amendments CLIA as qualified to perform high complexity clinical laboratory testing.    * >=100,000 COLONIES/mL KLEBSIELLA PNEUMONIAE  MRSA Next Gen by PCR, Nasal     Status: None   Collection Time: 04/14/24  6:32 PM   Specimen: Nasal Mucosa; Nasal Swab  Result Value Ref Range Status   MRSA by PCR Next Gen NOT DETECTED NOT DETECTED Final    Comment: (NOTE) The GeneXpert MRSA Assay (FDA approved for NASAL specimens only), is one component of a comprehensive MRSA colonization surveillance program. It is not intended to diagnose MRSA infection nor to guide or monitor treatment for MRSA infections. Test performance is not FDA approved in patients less than 45 years old. Performed at [facility], [address], [place], [place] [address]       Radiology Studies last 3 days: ECHOCARDIOGRAM COMPLETE Result Date: 04/16/2024    ECHOCARDIOGRAM REPORT   Patient Name:   ILEEN KAHRE Date of Exam: 04/16/2024 Medical Rec #:  [id number]       Height:       65.0 in Accession #:    [id number]      Weight:       160.1 lb Date of Birth:  1966/01/08       BSA:          1.799 m Patient Age:    58 years        BP:           110/45 mmHg Patient Gender: F               HR:           71 bpm. Exam Location:  [facility] Procedure: 2D Echo, Color Doppler and Cardiac Doppler (Both Spectral and Color            Flow Doppler were utilized during  procedure). Indications:     R55 Syncope  History:         Patient has prior history of Echocardiogram examinations.                  Previous Myocardial Infarction; Risk Factors:Hypertension,                  Diabetes and Dyslipidemia.  Sonographer:     L. Thornton-Maynard, RDCS Referring Phys:  [id number] LANETA BLUNT Diagnosing Phys: Cara JONETTA Lovelace MD  Sonographer Comments: Global longitudinal strain was attempted. IMPRESSIONS  1. Left ventricular ejection fraction, by estimation, is 60 to 65%. The left ventricle has normal function. The  left ventricle has no regional wall motion abnormalities. Left ventricular diastolic parameters are consistent with Grade I diastolic dysfunction (impaired relaxation). The average left ventricular global longitudinal strain is 18.0 %. The global longitudinal strain is normal.  2. Right ventricular systolic function is normal. The right ventricular size is normal. There is normal pulmonary artery systolic pressure.  3. The mitral valve is normal in structure. Trivial mitral valve regurgitation.  4. The aortic valve is normal in structure. Aortic valve regurgitation is not visualized. FINDINGS  Left Ventricle: Left ventricular ejection fraction, by estimation, is 60 to 65%. The left ventricle has normal function. The left ventricle has no regional wall motion abnormalities. The average left ventricular global longitudinal strain is 18.0 %. Strain was performed and the global longitudinal strain is normal. The left ventricular internal cavity size was normal in size. There is borderline left ventricular hypertrophy. Left ventricular diastolic parameters are consistent with Grade I diastolic  dysfunction (impaired relaxation). Right Ventricle: The right ventricular size is normal. No increase in right ventricular wall thickness. Right ventricular systolic function is normal. There is normal pulmonary artery systolic pressure. The tricuspid regurgitant velocity is 1.67 m/s, and   with an assumed right atrial pressure of 3 mmHg, the estimated right ventricular systolic pressure is 14.2 mmHg. Left Atrium: Left atrial size was normal in size. Right Atrium: Right atrial size was normal in size. Pericardium: There is no evidence of pericardial effusion. Mitral Valve: The mitral valve is normal in structure. Trivial mitral valve regurgitation. Tricuspid Valve: The tricuspid valve is normal in structure. Tricuspid valve regurgitation is trivial. Aortic Valve: The aortic valve is normal in structure. Aortic valve regurgitation is not visualized. Aortic valve mean gradient measures 5.5 mmHg. Aortic valve peak gradient measures 9.7 mmHg. Aortic valve area, by VTI measures 1.78 cm. Pulmonic Valve: The pulmonic valve was not well visualized. Pulmonic valve regurgitation is not visualized. Aorta: The ascending aorta was not well visualized. IAS/Shunts: No atrial level shunt detected by color flow Doppler. Additional Comments: 3D was performed not requiring image post processing on an independent workstation and was indeterminate.  LEFT VENTRICLE PLAX 2D LVIDd:         4.50 cm     Diastology LVIDs:         2.90 cm     LV e' medial:    7.94 cm/s LV PW:         1.00 cm     LV E/e' medial:  10.1 LV IVS:        1.00 cm     LV e' lateral:   11.20 cm/s LVOT diam:     2.00 cm     LV E/e' lateral: 7.2 LV SV:         54 LV SV Index:   30          2D Longitudinal Strain LVOT Area:     3.14 cm    2D Strain GLS (A4C):   19.9 % LV IVRT:       85 msec     2D Strain GLS (A3C):   15.4 %                            2D Strain GLS (A2C):   18.6 %                            2D Strain GLS Avg:  18.0 % LV Volumes (MOD) LV vol d, MOD A2C: 52.4 ml LV vol d, MOD A4C: 81.0 ml LV vol s, MOD A2C: 25.2 ml LV vol s, MOD A4C: 27.1 ml LV SV MOD A2C:     27.2 ml LV SV MOD A4C:     81.0 ml LV SV MOD BP:      39.9 ml RIGHT VENTRICLE RV Basal diam:  2.40 cm RV Mid diam:    2.10 cm RV S prime:     10.00 cm/s TAPSE (M-mode): 1.6 cm LEFT  ATRIUM             Index        RIGHT ATRIUM          Index LA diam:        2.50 cm 1.39 cm/m   RA Area:     9.56 cm LA Vol (A2C):   48.1 ml 26.73 ml/m  RA Volume:   18.20 ml 10.11 ml/m LA Vol (A4C):   26.1 ml 14.50 ml/m LA Biplane Vol: 35.9 ml 19.95 ml/m  AORTIC VALVE                     PULMONIC VALVE AV Area (Vmax):    2.01 cm      PV Vmax:       0.94 m/s AV Area (Vmean):   1.90 cm      PV Peak grad:  3.5 mmHg AV Area (VTI):     1.78 cm AV Vmax:           156.00 cm/s AV Vmean:          106.500 cm/s AV VTI:            0.302 m AV Peak Grad:      9.7 mmHg AV Mean Grad:      5.5 mmHg LVOT Vmax:         100.00 cm/s LVOT Vmean:        64.500 cm/s LVOT VTI:          0.171 m LVOT/AV VTI ratio: 0.57  AORTA Ao Root diam: 3.10 cm Ao Asc diam:  3.10 cm MITRAL VALVE               TRICUSPID VALVE MV Area (PHT): 4.31 cm    TR Peak grad:   11.2 mmHg MV Decel Time: 176 msec    TR Vmax:        167.00 cm/s MV E velocity: 80.20 cm/s MV A velocity: 84.90 cm/s  SHUNTS MV E/A ratio:  0.94        Systemic VTI:  0.17 m                            Systemic Diam: 2.00 cm Cara JONETTA Lovelace MD Electronically signed by Cara JONETTA Lovelace MD Signature Date/Time: 04/16/2024/11:15:53 AM    Final    MR BRAIN WO CONTRAST Result Date: 04/15/2024 EXAM: MRI BRAIN WITHOUT CONTRAST 04/15/2024 02:43:23 PM TECHNIQUE: Multiplanar multisequence MRI of the head/brain was performed without the administration of intravenous contrast. COMPARISON: CT head 04/14/2024 and MRI head 10/24/2021. CLINICAL HISTORY: Neuro deficit, acute, stroke suspected. FINDINGS: BRAIN AND VENTRICLES: No acute infarct. No intracranial hemorrhage. No mass. No midline shift. No hydrocephalus. Mild areas of T2 and FLAIR hyperintensity in the periventricular white matter which are slightly increased since the 2023 MRI suggestive of mild chronic microvascular ischemic changes. 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 The sella is unremarkable. Normal flow voids. ORBITS: No acute abnormality. SINUSES AND MASTOIDS: Trace fluid in the mastoid air cells. BONES AND SOFT TISSUES: Normal marrow signal. No acute soft tissue abnormality. IMPRESSION: 1. No acute intracranial abnormality. 2. 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 3. Mild chronic microvascular ischemic changes, slightly increased since the 2023 MRI. Electronically signed by: Donnice Mania MD 04/15/2024 03:43 PM EST RP Workstation: [id number]   DG Humerus Left Result Date: 04/15/2024 CLINICAL DATA:  Left humeral fracture. EXAM: LEFT HUMERUS - 2+ VIEW COMPARISON:  Radiograph 03/30/2024 FINDINGS: Left proximal humerus fracture demonstrates increased displacement from prior exam. Osseous overriding of 7-8 mm. There is progressive apex lateral angulation. No internal or peripheral callus formation. No evidence of glenohumeral dislocation. The distal humerus is intact. Mild soft tissue edema persists. IMPRESSION: Increased displacement and angulation of left proximal humerus fracture. No internal or peripheral callus formation. Electronically Signed   By: Andrea Gasman M.D.   On: 04/15/2024 13:34        Malakie Balis, DO [facility] 04/18/2024, 6:20 PM    Dictation software may have been used to generate the above note. Typos may occur and escape review in typed/dictated notes. Please contact Dr Marsa directly for clarity if needed.  Staff may message me via secure chat in Epic  but this may not receive an immediate response,  please page me for urgent matters!  If 7PM-7AM, please contact night coverage [email or web address]       "

## 2024-04-19 DIAGNOSIS — E1110 Type 2 diabetes mellitus with ketoacidosis without coma: Secondary | ICD-10-CM | POA: Diagnosis not present

## 2024-04-19 LAB — BLOOD GAS, VENOUS
Acid-base deficit: 3.5 mmol/L — ABNORMAL HIGH (ref 0.0–2.0)
Bicarbonate: 22.7 mmol/L (ref 20.0–28.0)
Patient temperature: 37
pCO2, Ven: 44 mmHg (ref 44–60)
pH, Ven: 7.32 (ref 7.25–7.43)
pO2, Ven: 48 mmHg — ABNORMAL HIGH (ref 32–45)

## 2024-04-19 LAB — GLUCOSE, CAPILLARY
Glucose-Capillary: 236 mg/dL — ABNORMAL HIGH (ref 70–99)
Glucose-Capillary: 224 mg/dL — ABNORMAL HIGH (ref 70–99)
Glucose-Capillary: 247 mg/dL — ABNORMAL HIGH (ref 70–99)
Glucose-Capillary: 325 mg/dL — ABNORMAL HIGH (ref 70–99)

## 2024-04-19 MED ORDER — ACETAMINOPHEN 325 MG PO TABS
650.0000 mg | ORAL_TABLET | Freq: Four times a day (QID) | ORAL | Status: DC | PRN
  Administered 2024-04-19 – 2024-04-20 (×2): 650 mg via ORAL
  Filled 2024-04-19 (×2): qty 2

## 2024-04-19 NOTE — Progress Notes (Signed)
 Physical Therapy Treatment Patient Details Name: [name] MRN: [id number] DOB: 1965-05-30 Today's Date: 04/19/2024   History of Present Illness 58 y/o female presented to ED on 04/14/24 for AMS and lethargy. Recent ED visit 12/11 for fall with L comminuted fracture of humeral neck. Admitted for DKA and acute metabolic encephalopathy. MRI negative. PMH: HTN, T2DM, NASH, peripheral neuropathy, fibromylagia.    PT Comments  Patient seen for PT session focused on OOB mobility.Tolerated session fair with mild signs of exertion with 8' feet ambulation. Patient has significant gait and mobility impairments characterized by left-sided lower extremity (LLE) weakness, and mild orthostatic hypotension. Functional mobility is currently limited by reduced single-leg load tolerance on the LLE and the need for manual trunk support to maintain balance. Patient shows fair potential to make progress with continued acute level rehab. Patient continues to demonstrate moderate activity restrictions and poor tolerance for progressive mobility. Continued skilled PT recommended to progress toward functional goals and support discharge readiness. Pt making good progress toward goals, will continue to follow POC. Discharge recommendation remains appropriate       If plan is discharge home, recommend the following: A little help with walking and/or transfers;A lot of help with bathing/dressing/bathroom;Assistance with cooking/housework;Assist for transportation   Can travel by private vehicle     Yes  Equipment Recommendations  Other (comment)    Recommendations for Other Services       Precautions / Restrictions Precautions Precautions: Fall Recall of Precautions/Restrictions: Intact Required Braces or Orthoses: Sling Restrictions Weight Bearing Restrictions Per Provider Order: Yes LUE Weight Bearing Per Provider Order: Non weight bearing     Mobility  Bed Mobility Overal bed mobility: Needs  Assistance Bed Mobility: Supine to Sit     Supine to sit: Min assist, HOB elevated, Used rails          Transfers Overall transfer level: Needs assistance Equipment used: 1 person hand held assist Transfers: Sit to/from Stand Sit to Stand: Min assist, Mod assist                Ambulation/Gait Ambulation/Gait assistance: Mod assist Gait Distance (Feet): 8 Feet Assistive device: 1 person hand held assist Gait Pattern/deviations: Step-to pattern Gait velocity: decreased   Pre-gait activities: marching at bedside to increase BP; reduced cleareance of LLE and reduced single leg load tolerance General Gait Details: limited use of L UE; 1+ hand held support provided at trunk; very unstable and limited strength in LLE compared to RLE; mildly orthostatic with initial standing   Stairs             Wheelchair Mobility     Tilt Bed    Modified Rankin (Stroke Patients Only)       Balance Overall balance assessment: Needs assistance Sitting-balance support: Feet supported, No upper extremity supported Sitting balance-Leahy Scale: Fair     Standing balance support: Single extremity supported, During functional activity Standing balance-Leahy Scale: Poor                              Communication Communication Communication: No apparent difficulties  Cognition Arousal: Alert Behavior During Therapy: WFL for tasks assessed/performed   PT - Cognitive impairments: No apparent impairments                         Following commands: Intact      Cueing Cueing Techniques: Verbal cues  Exercises  General Comments        Pertinent Vitals/Pain Pain Assessment Pain Assessment: Faces Faces Pain Scale: Hurts little more Pain Location: L shoulder Pain Descriptors / Indicators: Guarding, Grimacing, Sharp Pain Intervention(s): Monitored during session, Limited activity within patient's tolerance    Home Living                           Prior Function            PT Goals (current goals can now be found in the care plan section) Acute Rehab PT Goals Patient Stated Goal: to reduce pain PT Goal Formulation: With patient/family Time For Goal Achievement: 04/30/24 Potential to Achieve Goals: Good Progress towards PT goals: Progressing toward goals    Frequency    Min 2X/week      PT Plan      Co-evaluation              AM-PAC PT 6 Clicks Mobility   Outcome Measure  Help needed turning from your back to your side while in a flat bed without using bedrails?: A Little Help needed moving from lying on your back to sitting on the side of a flat bed without using bedrails?: A Little Help needed moving to and from a bed to a chair (including a wheelchair)?: A Little Help needed standing up from a chair using your arms (e.g., wheelchair or bedside chair)?: A Little Help needed to walk in hospital room?: A Lot Help needed climbing 3-5 steps with a railing? : Total 6 Click Score: 15    End of Session   Activity Tolerance: Patient limited by fatigue Patient left: in chair;with call bell/phone within reach;with family/visitor present Nurse Communication: Mobility status PT Visit Diagnosis: Unsteadiness on feet (R26.81);History of falling (Z91.81);Muscle weakness (generalized) (M62.81);Difficulty in walking, not elsewhere classified (R26.2)     Time: 8786-8761 PT Time Calculation (min) (ACUTE ONLY): 25 min  Charges:    $Therapeutic Activity: 8-22 mins PT General Charges $$ ACUTE PT VISIT: 1 Visit                     Sherlean Lesches DPT, PT     Sherlean A Darlena Koval 04/19/2024, 1:05 PM

## 2024-04-19 NOTE — TOC Progression Note (Addendum)
 Transition of Care [facility]) - Progression Note    Patient Details  Name: [name] MRN: [id number] Date of Birth: [birth year]  [facility] CM/SW Contact  Corean ONEIDA Haddock, RN Phone Number: 04/19/2024, 3:46 PM  Clinical Narrative:     DONALD pending Jamie RNCM has sent updated clinical to [facility]  Bed offer of [facility].  Patient states I'm not going there, However she also says its not an option for her to go home.  She request that I call her daughter Rosina Charlott Rosina.  She request that I call [address], [place], and [facility] [place] [facility] to see if they are in network with [employer] (they have not yet responded in the [place]). I spoke with each admissions coordinator and they all confirmed they are not in network with patient's insurance.  Daughter states she discussed with the patient that she is going to have to go somewhere, but that ultimately its the patient's decision.   Followed up with patient.  She accepts bed at [facility] accepted bed in [place] and notified [place] at [place] [facility].  Message sent to MD to determine if patient is medically ready for auth to be started    Update:  Per MD patient medically appropriate for auth to be started.  Requested Isaiah at [facility] to start auth                 Expected Discharge Plan and Services                                               Social Drivers of Health (SDOH) Interventions SDOH Screenings   Depression [phone]): Low Risk (11/28/2021)  Tobacco Use: Medium Risk (04/15/2024)    Readmission Risk Interventions    10/28/2021   10:28 AM  Readmission Risk Prevention Plan  Post Dischage Appt Complete  Medication Screening Complete  Transportation Screening Complete

## 2024-04-19 NOTE — Inpatient Diabetes Management (Signed)
 Inpatient Diabetes Program Recommendations  AACE/ADA: New Consensus Statement on Inpatient Glycemic Control (2015)  Target Ranges:  Prepandial:   less than 140 mg/dL      Peak postprandial:   less than 180 mg/dL (1-2 hours)      Critically ill patients:  140 - 180 mg/dL    Latest Reference Range & Units 04/18/24 07:51 04/18/24 11:43 04/18/24 16:25 04/18/24 22:00  Glucose-Capillary 70 - 99 mg/dL 785 (H)  8 units Novolog   25 units Lantus   207 (H)  8 units Novolog   281 (H)  10 units Novolog   296 (H)  3 units Novolog    (H): Data is abnormally high  Latest Reference Range & Units 04/19/24 07:58  Glucose-Capillary 70 - 99 mg/dL 763 (H)  (H): Data is abnormally high     Home DM Meds: Basaglar  35 units daily Novolog  0-15 units TID per SSI Metformin  1000 mg BID Jardiance 10 mg daily  Current Orders: Lantus  25 units daily Novolog  Sensitive Correction Scale/ SSI (0-9 units) TID AC + HS Novolog  5 units TID with meals    MD- Please consider:  1. Increase Lantus  to 30 units daily  2. Increase Novolog  Meal Coverage to 8 units TID with meals   --Will follow patient during hospitalization--  Adina Rudolpho Arrow RN, MSN, CDCES Diabetes Coordinator Inpatient Glycemic Control Team Team Pager: [phone] (8a-5p)

## 2024-04-19 NOTE — Plan of Care (Signed)

## 2024-04-19 NOTE — Progress Notes (Signed)
 "    Progress Note    [name]  FMW:[id number] DOB: 1965/05/12  DOA: 04/14/2024 PCP: [facility] Personal      Brief Narrative:    Medical records reviewed and are as summarized below:  [name] is a 58 y.o. female with medical history of hypertension, hyperlipidemia, type 2 diabetes, who was brought to the hospital by her mother because of confusion, somnolence and poor oral intake.  She was severely hyperglycemic with glucose of 957.     Assessment/Plan:   Principal Problem:   DKA (diabetic ketoacidosis) (HCC) Active Problems:   Hyperlipidemia   GERD (gastroesophageal reflux disease)   Fibromyalgia   Essential hypertension   CAD (coronary artery disease)   Uncontrolled type 2 diabetes mellitus with hyperglycemia, with long-term current use of insulin  (HCC)    Body mass index is 27.73 kg/m.   Type II DM with DKA: DKA resolved. Continue Lantus , scheduled NovoLog  and NovoLog  as needed. Hemoglobin A1c 16.8   Acute metabolic encephalopathy: Resolved   AKI: Resolved   UTI: Urine culture showed Klebsiella. Completed 5 days of Rocephin .   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   Left humerus fracture: She was evaluated by orthopedic surgeon.  No plan for surgery.  Maintain sling.   Multiple falls at home, general weakness: PT recommended discharge to SNF   Orthostatic hypotension: Continue midodrine . History of hypertension.  Lisinopril  on hold   Comorbidities include fibromyalgia, hyperlipidemia, GERD  Diet Order             Diet Carb Modified Room service appropriate? Yes  Diet effective now                                  Consultants: Orthopedic surgeon  Procedures: None    Medications:    atorvastatin   80 mg Oral QHS   Chlorhexidine  Gluconate Cloth  6 each Topical Q0600   DULoxetine   60 mg  Oral QHS   enoxaparin  (LOVENOX ) injection  40 mg Subcutaneous Q24H   gabapentin   300 mg Oral TID   insulin  aspart  0-5 Units Subcutaneous QHS   insulin  aspart  0-9 Units Subcutaneous TID WC   insulin  aspart  5 Units Subcutaneous TID WC   insulin  glargine  25 Units Subcutaneous Daily   midodrine   5 mg Oral TID WC   naproxen   250 mg Oral TID WC   pantoprazole   40 mg Oral Daily   sodium chloride  flush  3 mL Intravenous Q12H   Continuous Infusions:  cefTRIAXone  (ROCEPHIN )  IV 1 g (04/18/24 1607)     Anti-infectives (From admission, onward)    Start     Dose/Rate Route Frequency Ordered Stop   04/15/24 1600  cefTRIAXone  (ROCEPHIN ) 1 g in sodium chloride  0.9 % 100 mL IVPB        1 g 200 mL/hr over 30 Minutes Intravenous Every 24 hours 04/15/24 1509 04/20/24 1559              Family Communication/Anticipated D/C date and plan/Code Status   DVT prophylaxis: enoxaparin  (LOVENOX ) injection 40 mg Start: 04/17/24 1930     Code Status: Full Code  Family Communication: None Disposition Plan: Plan for discharge to SNF   Status is: Inpatient Remains inpatient appropriate because: Awaiting placement to SNF       Subjective:   Interval events noted.  No new  complaints.  She feels better.  Objective:    Vitals:   04/18/24 1951 04/19/24 0252 04/19/24 0303 04/19/24 0759  BP: 106/60 128/80  (!) 124/51  Pulse: 60 60  (!) 59  Resp: 16 17  16   Temp: 98.4 F (36.9 C)   98 F (36.7 C)  TempSrc: Oral     SpO2: 100% 96%  97%  Weight:   75.6 kg   Height:       No data found.   Intake/Output Summary (Last 24 hours) at 04/19/2024 1123 Last data filed at 04/19/2024 0303 Gross per 24 hour  Intake 480 ml  Output 400 ml  Net 80 ml   Filed Weights   04/16/24 0345 04/17/24 0951 04/19/24 0303  Weight: 72.6 kg 73.1 kg 75.6 kg    Exam:  GEN: NAD SKIN: Warm and dry EYES: No pallor or icterus ENT: MMM CV: RRR PULM: CTA B ABD: soft, ND, NT, +BS CNS: AAO x 3, non  focal EXT: Limited range of motion of left shoulder joint        Data Reviewed:   I have personally reviewed following labs and imaging studies:  Labs: Labs show the following:   Basic Metabolic Panel: Recent Labs  Lab 04/14/24 1311 04/14/24 1616 04/14/24 1956 04/14/24 2208 04/15/24 0318 04/15/24 0632 04/15/24 1548 04/16/24 0057 04/17/24 0646 04/18/24 0513  NA 140   < > 147* 151*   < > 145 147* 143 138 138  K 4.5   < > 3.2* 3.0*   < > 4.0 3.6 4.1 4.0 4.0  CL 100   < > 111 113*   < > 110 112* 108 105 103  CO2 10*   < > 21* 24   < > 29 27 27 24 26   GLUCOSE 957*   < > 467* 283*   < > 218* 95 241* 271* 233*  BUN 27*   < > 23* 21*   < > 17 14 13 16 17   CREATININE 1.60*   < > 1.26* 1.14*   < > 1.10* 1.01* 1.04* 0.92 0.95  CALCIUM  10.4*   < > 9.9 10.0   < > 9.1 8.7* 8.7* 9.0 8.9  MG 2.7*  --  2.3  --   --   --   --   --   --   --   PHOS  --   --   --  <1.0*  --  2.6  --   --   --   --    < > = values in this interval not displayed.   GFR Estimated Creatinine Clearance: 64.8 mL/min (by C-G formula based on SCr of 0.95 mg/dL). Liver Function Tests: Recent Labs  Lab 04/14/24 1311  AST <10*  ALT 12  ALKPHOS 208*  BILITOT 0.3  PROT 7.0  ALBUMIN  3.6   No results for input(s): LIPASE, AMYLASE in the last 168 hours. Recent Labs  Lab 04/14/24 1311  AMMONIA <13   Coagulation profile No results for input(s): INR, PROTIME in the last 168 hours.  CBC: Recent Labs  Lab 04/14/24 1311 04/15/24 0318 04/16/24 0057 04/17/24 0646 04/18/24 0513  WBC 16.7* 19.5* 14.1* 12.6* 11.4*  NEUTROABS 14.7*  --   --   --   --   HGB 11.1* 9.8* 9.3* 10.3* 10.6*  HCT 34.6* 28.9* 27.9* 30.3* 31.4*  MCV 98.9 92.0 95.5 94.1 94.3  PLT 806* 686* 560* 478* 412*   Cardiac Enzymes: Recent Labs  Lab  04/14/24 1311  CKTOTAL 69   BNP (last 3 results) No results for input(s): PROBNP in the last 8760 hours. CBG: Recent Labs  Lab 04/18/24 0751 04/18/24 1143 04/18/24 1625  04/18/24 2200 04/19/24 0758  GLUCAP 214* 207* 281* 296* 236*   D-Dimer: No results for input(s): DDIMER in the last 72 hours. Hgb A1c: No results for input(s): HGBA1C in the last 72 hours. Lipid Profile: No results for input(s): CHOL, HDL, LDLCALC, TRIG, CHOLHDL, LDLDIRECT in the last 72 hours. Thyroid  function studies: No results for input(s): TSH, T4TOTAL, T3FREE, THYROIDAB in the last 72 hours.  Invalid input(s): FREET3 Anemia work up: No results for input(s): VITAMINB12, FOLATE, FERRITIN, TIBC, IRON, RETICCTPCT in the last 72 hours. Sepsis Labs: Recent Labs  Lab 04/15/24 0016 04/15/24 0318 04/16/24 0057 04/17/24 0646 04/18/24 0513  WBC  --  19.5* 14.1* 12.6* 11.4*  LATICACIDVEN 1.7  --   --   --   --     Microbiology Recent Results (from the past 240 hours)  Resp panel by RT-PCR (RSV, Flu A&B, Covid) Anterior Nasal Swab     Status: None   Collection Time: 04/14/24  1:11 PM   Specimen: Anterior Nasal Swab  Result Value Ref Range Status   SARS Coronavirus 2 by RT PCR NEGATIVE NEGATIVE Final    Comment: (NOTE) SARS-CoV-2 target nucleic acids are NOT DETECTED.  The SARS-CoV-2 RNA is generally detectable in upper respiratory specimens during the acute phase of infection. The lowest concentration of SARS-CoV-2 viral copies this assay can detect is 138 copies/mL. A negative result does not preclude SARS-Cov-2 infection and should not be used as the sole basis for treatment or other patient management decisions. A negative result may occur with  improper specimen collection/handling, submission of specimen other than nasopharyngeal swab, presence of viral mutation(s) within the areas targeted by this assay, and inadequate number of viral copies(<138 copies/mL). A negative result must be combined with clinical observations, patient history, and epidemiological information. The expected result is Negative.  Fact Sheet for Patients:   bloggercourse.com  Fact Sheet for Healthcare Providers:  seriousbroker.it  This test is no t yet approved or cleared by the [place]  FDA and  has been authorized for detection and/or diagnosis of SARS-CoV-2 by FDA under an Emergency Use Authorization (EUA). This EUA will remain  in effect (meaning this test can be used) for the duration of the COVID-19 declaration under Section 564(b)(1) of the Act, 21 U.S.C.section 360bbb-3(b)(1), unless the authorization is terminated  or revoked sooner.       Influenza A by PCR NEGATIVE NEGATIVE Final   Influenza B by PCR NEGATIVE NEGATIVE Final    Comment: (NOTE) The Xpert Xpress SARS-CoV-2/FLU/RSV plus assay is intended as an aid in the diagnosis of influenza from Nasopharyngeal swab specimens and should not be used as a sole basis for treatment. Nasal washings and aspirates are unacceptable for Xpert Xpress SARS-CoV-2/FLU/RSV testing.  Fact Sheet for Patients: bloggercourse.com  Fact Sheet for Healthcare Providers: seriousbroker.it  This test is not yet approved or cleared by the [place]  FDA and has been authorized for detection and/or diagnosis of SARS-CoV-2 by FDA under an Emergency Use Authorization (EUA). This EUA will remain in effect (meaning this test can be used) for the duration of the COVID-19 declaration under Section 564(b)(1) of the Act, 21 U.S.C. section 360bbb-3(b)(1), unless the authorization is terminated or revoked.     Resp Syncytial Virus by PCR NEGATIVE NEGATIVE Final    Comment: (NOTE)  Fact Sheet for Patients: bloggercourse.com  Fact Sheet for Healthcare Providers: seriousbroker.it  This test is not yet approved or cleared by the [place]  FDA and has been authorized for detection and/or diagnosis of SARS-CoV-2 by FDA under an Emergency Use  Authorization (EUA). This EUA will remain in effect (meaning this test can be used) for the duration of the COVID-19 declaration under Section 564(b)(1) of the Act, 21 U.S.C. section 360bbb-3(b)(1), unless the authorization is terminated or revoked.  Performed at [facility], [address], [place], [place] [address]   Urine Culture (for pregnant, neutropenic or urologic patients or patients with an indwelling urinary catheter)     Status: Abnormal   Collection Time: 04/14/24  1:11 PM   Specimen: Urine, Clean Catch  Result Value Ref Range Status   Specimen Description   Final    URINE, CLEAN CATCH Performed at [facility], [address], [place], [place] [address]    Special Requests   Final    NONE Performed at [facility], [address] [place], [place] [address]    Culture >=100,000 COLONIES/mL KLEBSIELLA PNEUMONIAE (A)  Final   Report Status 04/16/2024 FINAL  Final   Organism ID, Bacteria KLEBSIELLA PNEUMONIAE (A)  Final      Susceptibility   Klebsiella pneumoniae - MIC*    AMPICILLIN RESISTANT Resistant     CEFAZOLIN  (URINE) Value in next row Sensitive      <=1 SENSITIVEThis is a modified FDA-approved test that has been validated and its performance characteristics determined by the reporting laboratory.  This laboratory is certified under the Clinical Laboratory Improvement Amendments CLIA as qualified to perform high complexity clinical laboratory testing.    CEFEPIME Value in next row Sensitive      <=1 SENSITIVEThis is a modified FDA-approved test that has been validated and its performance characteristics determined by the reporting laboratory.  This laboratory is certified under the Clinical Laboratory Improvement Amendments CLIA as qualified to perform high complexity clinical laboratory testing.    ERTAPENEM Value in next row Sensitive      <=1 SENSITIVEThis is a modified FDA-approved test that has been validated and its performance  characteristics determined by the reporting laboratory.  This laboratory is certified under the Clinical Laboratory Improvement Amendments CLIA as qualified to perform high complexity clinical laboratory testing.    CEFTRIAXONE  Value in next row Sensitive      <=1 SENSITIVEThis is a modified FDA-approved test that has been validated and its performance characteristics determined by the reporting laboratory.  This laboratory is certified under the Clinical Laboratory Improvement Amendments CLIA as qualified to perform high complexity clinical laboratory testing.    CIPROFLOXACIN  Value in next row Sensitive      <=1 SENSITIVEThis is a modified FDA-approved test that has been validated and its performance characteristics determined by the reporting laboratory.  This laboratory is certified under the Clinical Laboratory Improvement Amendments CLIA as qualified to perform high complexity clinical laboratory testing.    GENTAMICIN Value in next row Sensitive      <=1 SENSITIVEThis is a modified FDA-approved test that has been validated and its performance characteristics determined by the reporting laboratory.  This laboratory is certified under the Clinical Laboratory Improvement Amendments CLIA as qualified to perform high complexity clinical laboratory testing.    NITROFURANTOIN Value in next row Resistant      <=1 SENSITIVEThis is a modified FDA-approved test that has been validated and its performance characteristics determined by the reporting laboratory.  This laboratory is certified under the Clinical Laboratory Improvement Amendments CLIA as qualified to perform high complexity clinical laboratory testing.    TRIMETH/SULFA Value in next row Sensitive      <=1 SENSITIVEThis is a modified FDA-approved test that has been validated and its performance characteristics determined by the reporting laboratory.  This laboratory is certified under the Clinical Laboratory Improvement Amendments CLIA as qualified  to perform high complexity clinical laboratory testing.    AMPICILLIN/SULBACTAM Value in next row Sensitive      <=1 SENSITIVEThis is a modified FDA-approved test that has been validated and its performance characteristics determined by the reporting laboratory.  This laboratory is certified under the Clinical Laboratory Improvement Amendments CLIA as qualified to perform high complexity clinical laboratory testing.    PIP/TAZO Value in next row Sensitive      <=4 SENSITIVEThis is a modified FDA-approved test that has been validated and its performance characteristics determined by the reporting laboratory.  This laboratory is certified under the Clinical Laboratory Improvement Amendments CLIA as qualified to perform high complexity clinical laboratory testing.    MEROPENEM  Value in next row Sensitive      <=4 SENSITIVEThis is a modified FDA-approved test that has been validated and its performance characteristics determined by the reporting laboratory.  This laboratory is certified under the Clinical Laboratory Improvement Amendments CLIA as qualified to perform high complexity clinical laboratory testing.    * >=100,000 COLONIES/mL KLEBSIELLA PNEUMONIAE  MRSA Next Gen by PCR, Nasal     Status: None   Collection Time: 04/14/24  6:32 PM   Specimen: Nasal Mucosa; Nasal Swab  Result Value Ref Range Status   MRSA by PCR Next Gen NOT DETECTED NOT DETECTED Final    Comment: (NOTE) The GeneXpert MRSA Assay (FDA approved for NASAL specimens only), is one component of a comprehensive MRSA colonization surveillance program. It is not intended to diagnose MRSA infection nor to guide or monitor treatment for MRSA infections. Test performance is not FDA approved in patients less than 28 years old. Performed at [facility], [address] [place], [place] [address]     Procedures and diagnostic studies:  No results found.             LOS: 5 days   Kvon Mcilhenny  [facility]   Pager on [email or web address]. If 7PM-7AM, please contact night-coverage at [email or web address]     04/19/2024, 11:23 AM           "

## 2024-04-19 NOTE — Progress Notes (Signed)
" °   04/19/24 0915  Spiritual Encounters  Type of Visit Initial  Care provided to: Patient  Referral source Physician  Reason for visit Advance directives  Interventions  Spiritual Care Interventions Made Established relationship of care and support;Compassionate presence  Intervention Outcomes  Outcomes Connection to spiritual care;Awareness of support  Spiritual Care Plan  Spiritual Care Issues Still Outstanding No further spiritual care needs at this time (see row info)   Chaplain completed Advance Directives for pt "

## 2024-04-19 NOTE — Progress Notes (Signed)
 Patient ID: [name], female   DOB: [birth year], 58 y.o.   MRN: [id number]  Subjective: The patient still notes mild to moderate shoulder discomfort, especially with motion, but does feel that she is doing well overall and feels that the present shoulder immobilizer is more comfortable than the previous one was.  She is tolerating mobilization with physical therapy, but admits that her weak left lower extremity is causing some difficulties with her mobility.  She has no new complaints pertaining to her left shoulder or upper extremity.   Objective: Vital signs in last 24 hours: Temp:  [97.9 F (36.6 C)-98.4 F (36.9 C)] 98 F (36.7 C) (12/31 0759) Pulse Rate:  [59-66] 59 (12/31 0759) Resp:  [16-17] 16 (12/31 0759) BP: (82-128)/(51-80) 124/51 (12/31 0759) SpO2:  [96 %-100 %] 97 % (12/31 0759) Weight:  [75.6 kg] 75.6 kg (12/31 0303)  Intake/Output from previous day: 12/30 0701 - 12/31 0700 In: 480 [P.O.:480] Out: 1100 [Urine:1100] Intake/Output this shift: Total I/O In: 240 [P.O.:240] Out: -   Recent Labs    04/17/24 0646 04/18/24 0513  HGB 10.3* 10.6*   Recent Labs    04/17/24 0646 04/18/24 0513  WBC 12.6* 11.4*  RBC 3.22* 3.33*  HCT 30.3* 31.4*  PLT 478* 412*   Recent Labs    04/17/24 0646 04/18/24 0513  NA 138 138  K 4.0 4.0  CL 105 103  CO2 24 26  BUN 16 17  CREATININE 0.92 0.95  GLUCOSE 271* 233*  CALCIUM  9.0 8.9   No results for input(s): LABPT, INR in the last 72 hours.  Physical Exam: Orthopedic examination again is limited to the left shoulder and upper extremity.  The findings are unchanged as compared to yesterday.  She remains neurovascularly intact to the left upper extremity and hand.  Assessment: Mildly displaced extra-articular left proximal humerus fracture.  Plan: The treatment options been reviewed with the patient.  We will continue with nonsurgical treatment at this time.  The patient was is to remain in her shoulder  immobilizer at all times, removing it only for bathing purposes.  She may be mobilized with physical therapy as tolerated, and may continue to receive medication for pain as deemed appropriate from a medical standpoint.  I agree with the present plan for transfer to a skilled nursing facility for further rehabilitation.  Thank you for asking me to participate in the care of this most pleasant unfortunate woman.  I will sign off at this time.  Please make arrangements for her to follow-up in our office in the next 2 to 3 weeks with one of our PAs, preferably Gustavo Level, PA-C.  If you have further need of orthopedic input during this hospitalization, please reconsult me.   Tywana Robotham J Budd Freiermuth 04/19/2024, 2:46 PM

## 2024-04-20 DIAGNOSIS — E1110 Type 2 diabetes mellitus with ketoacidosis without coma: Secondary | ICD-10-CM | POA: Diagnosis not present

## 2024-04-20 LAB — GLUCOSE, CAPILLARY
Glucose-Capillary: 265 mg/dL — ABNORMAL HIGH (ref 70–99)
Glucose-Capillary: 320 mg/dL — ABNORMAL HIGH (ref 70–99)
Glucose-Capillary: 241 mg/dL — ABNORMAL HIGH (ref 70–99)
Glucose-Capillary: 313 mg/dL — ABNORMAL HIGH (ref 70–99)

## 2024-04-20 MED ORDER — INSULIN ASPART 100 UNIT/ML IJ SOLN
8.0000 [IU] | Freq: Three times a day (TID) | INTRAMUSCULAR | Status: DC
  Administered 2024-04-20 – 2024-04-21 (×4): 8 [IU] via SUBCUTANEOUS
  Filled 2024-04-20 (×4): qty 8

## 2024-04-20 NOTE — Plan of Care (Signed)
   Problem: Education: Goal: Knowledge of General Education information will improve Description Including pain rating scale, medication(s)/side effects and non-pharmacologic comfort measures Outcome: Progressing   Problem: Health Behavior/Discharge Planning: Goal: Ability to manage health-related needs will improve Outcome: Progressing

## 2024-04-20 NOTE — Progress Notes (Addendum)
 "    Progress Note    SIBBIE FLAMMIA  FMW:[id number] DOB: 1966/03/04  DOA: 04/14/2024 PCP: [facility] Dept Personal      Brief Narrative:    Medical records reviewed and are as summarized below:  Dream JOURNEY CASTONGUAY is a 59 y.o. female with medical history of hypertension, hyperlipidemia, type 2 diabetes, who was brought to the hospital by her mother because of confusion, somnolence and poor oral intake.  She was severely hyperglycemic with glucose of 957.     Assessment/Plan:   Principal Problem:   DKA (diabetic ketoacidosis) (HCC) Active Problems:   Hyperlipidemia   GERD (gastroesophageal reflux disease)   Fibromyalgia   Essential hypertension   CAD (coronary artery disease)   Uncontrolled type 2 diabetes mellitus with hyperglycemia, with long-term current use of insulin  (HCC)    Body mass index is 27.73 kg/m.   Type II DM with DKA: DKA resolved.  She is still hyperglycemic. Continue Lantus  25 units daily Increase NovoLog  from 5 units to 8 units 3 times daily with meals.  Use NovoLog  as needed for hyperglycemia. Hemoglobin A1c 16.8   Acute metabolic encephalopathy: Resolved   AKI: Resolved   UTI: Urine culture showed Klebsiella. Completed 5 days of Rocephin .   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   Left humerus fracture: She was evaluated by orthopedic surgeon.  No plan for surgery.  Maintain sling.   Multiple falls at home, general weakness: PT recommended discharge to SNF   Orthostatic hypotension: Continue midodrine . History of hypertension.  Lisinopril  on hold   Comorbidities include fibromyalgia, hyperlipidemia, GERD  Diet Order             Diet Carb Modified Room service appropriate? Yes  Diet effective now                                  Consultants: Orthopedic  surgeon  Procedures: None    Medications:    atorvastatin   80 mg Oral QHS   Chlorhexidine  Gluconate Cloth  6 each Topical Q0600   DULoxetine   60 mg Oral QHS   enoxaparin  (LOVENOX ) injection  40 mg Subcutaneous Q24H   gabapentin   300 mg Oral TID   insulin  aspart  0-5 Units Subcutaneous QHS   insulin  aspart  0-9 Units Subcutaneous TID WC   insulin  aspart  8 Units Subcutaneous TID WC   insulin  glargine  25 Units Subcutaneous Daily   midodrine   5 mg Oral TID WC   naproxen   250 mg Oral TID WC   pantoprazole   40 mg Oral Daily   sodium chloride  flush  3 mL Intravenous Q12H   Continuous Infusions:     Anti-infectives (From admission, onward)    Start     Dose/Rate Route Frequency Ordered Stop   04/15/24 1600  cefTRIAXone  (ROCEPHIN ) 1 g in sodium chloride  0.9 % 100 mL IVPB        1 g 200 mL/hr over 30 Minutes Intravenous Every 24 hours 04/15/24 1509 04/20/24 1002              Family Communication/Anticipated D/C date and plan/Code Status   DVT prophylaxis: enoxaparin  (LOVENOX ) injection 40 mg Start: 04/17/24 1930     Code Status: Full Code  Family Communication: None Disposition Plan: Plan for discharge to SNF   Status is: Inpatient Remains inpatient appropriate because: Awaiting placement to SNF  Subjective:   No acute event reported.  No new complaints.  She said her BP dropped yesterday when she was up with PT.  Objective:    Vitals:   04/19/24 0759 04/19/24 1536 04/19/24 2157 04/20/24 0410  BP: (!) 124/51 (!) 112/57 117/66 116/85  Pulse: (!) 59 66 (!) 56 62  Resp: 16 17 18 20   Temp: 98 F (36.7 C) 97.9 F (36.6 C) 98.4 F (36.9 C) 98.3 F (36.8 C)  TempSrc:      SpO2: 97% 98% 96% 95%  Weight:      Height:       No data found.   Intake/Output Summary (Last 24 hours) at 04/20/2024 1416 Last data filed at 04/20/2024 0900 Gross per 24 hour  Intake 480 ml  Output 1800 ml  Net -1320 ml   Filed Weights   04/16/24 0345 04/17/24 0951  04/19/24 0303  Weight: 72.6 kg 73.1 kg 75.6 kg    Exam:  GEN: NAD SKIN: Warm and dry EYES: No pallor or icterus ENT: MMM CV: RRR PULM: CTA B ABD: soft, ND, NT, +BS CNS: AAO x 3, non focal EXT: No edema or tenderness.  Limited range of motion of left shoulder joint       Data Reviewed:   I have personally reviewed following labs and imaging studies:  Labs: Labs show the following:   Basic Metabolic Panel: Recent Labs  Lab 04/14/24 1311 04/14/24 1616 04/14/24 1956 04/14/24 2208 04/15/24 0318 04/15/24 0632 04/15/24 1548 04/16/24 0057 04/17/24 0646 04/18/24 0513  NA 140   < > 147* 151*   < > 145 147* 143 138 138  K 4.5   < > 3.2* 3.0*   < > 4.0 3.6 4.1 4.0 4.0  CL 100   < > 111 113*   < > 110 112* 108 105 103  CO2 10*   < > 21* 24   < > 29 27 27 24 26   GLUCOSE 957*   < > 467* 283*   < > 218* 95 241* 271* 233*  BUN 27*   < > 23* 21*   < > 17 14 13 16 17   CREATININE 1.60*   < > 1.26* 1.14*   < > 1.10* 1.01* 1.04* 0.92 0.95  CALCIUM  10.4*   < > 9.9 10.0   < > 9.1 8.7* 8.7* 9.0 8.9  MG 2.7*  --  2.3  --   --   --   --   --   --   --   PHOS  --   --   --  <1.0*  --  2.6  --   --   --   --    < > = values in this interval not displayed.   GFR Estimated Creatinine Clearance: 64.8 mL/min (by C-G formula based on SCr of 0.95 mg/dL). Liver Function Tests: Recent Labs  Lab 04/14/24 1311  AST <10*  ALT 12  ALKPHOS 208*  BILITOT 0.3  PROT 7.0  ALBUMIN  3.6   No results for input(s): LIPASE, AMYLASE in the last 168 hours. Recent Labs  Lab 04/14/24 1311  AMMONIA <13   Coagulation profile No results for input(s): INR, PROTIME in the last 168 hours.  CBC: Recent Labs  Lab 04/14/24 1311 04/15/24 0318 04/16/24 0057 04/17/24 0646 04/18/24 0513  WBC 16.7* 19.5* 14.1* 12.6* 11.4*  NEUTROABS 14.7*  --   --   --   --   HGB 11.1* 9.8* 9.3* 10.3*  10.6*  HCT 34.6* 28.9* 27.9* 30.3* 31.4*  MCV 98.9 92.0 95.5 94.1 94.3  PLT 806* 686* 560* 478* 412*    Cardiac Enzymes: Recent Labs  Lab 04/14/24 1311  CKTOTAL 69   BNP (last 3 results) No results for input(s): PROBNP in the last 8760 hours. CBG: Recent Labs  Lab 04/19/24 1135 04/19/24 1536 04/19/24 2136 04/20/24 0745 04/20/24 1208  GLUCAP 224* 247* 325* 265* 320*   D-Dimer: No results for input(s): DDIMER in the last 72 hours. Hgb A1c: No results for input(s): HGBA1C in the last 72 hours. Lipid Profile: No results for input(s): CHOL, HDL, LDLCALC, TRIG, CHOLHDL, LDLDIRECT in the last 72 hours. Thyroid  function studies: No results for input(s): TSH, T4TOTAL, T3FREE, THYROIDAB in the last 72 hours.  Invalid input(s): FREET3 Anemia work up: No results for input(s): VITAMINB12, FOLATE, FERRITIN, TIBC, IRON, RETICCTPCT in the last 72 hours. Sepsis Labs: Recent Labs  Lab 04/15/24 0016 04/15/24 0318 04/16/24 0057 04/17/24 0646 04/18/24 0513  WBC  --  19.5* 14.1* 12.6* 11.4*  LATICACIDVEN 1.7  --   --   --   --     Microbiology Recent Results (from the past 240 hours)  Resp panel by RT-PCR (RSV, Flu A&B, Covid) Anterior Nasal Swab     Status: None   Collection Time: 04/14/24  1:11 PM   Specimen: Anterior Nasal Swab  Result Value Ref Range Status   SARS Coronavirus 2 by RT PCR NEGATIVE NEGATIVE Final    Comment: (NOTE) SARS-CoV-2 target nucleic acids are NOT DETECTED.  The SARS-CoV-2 RNA is generally detectable in upper respiratory specimens during the acute phase of infection. The lowest concentration of SARS-CoV-2 viral copies this assay can detect is 138 copies/mL. A negative result does not preclude SARS-Cov-2 infection and should not be used as the sole basis for treatment or other patient management decisions. A negative result may occur with  improper specimen collection/handling, submission of specimen other than nasopharyngeal swab, presence of viral mutation(s) within the areas targeted by this assay, and  inadequate number of viral copies(<138 copies/mL). A negative result must be combined with clinical observations, patient history, and epidemiological information. The expected result is Negative.  Fact Sheet for Patients:  bloggercourse.com  Fact Sheet for Healthcare Providers:  seriousbroker.it  This test is no t yet approved or cleared by the [place]  FDA and  has been authorized for detection and/or diagnosis of SARS-CoV-2 by FDA under an Emergency Use Authorization (EUA). This EUA will remain  in effect (meaning this test can be used) for the duration of the COVID-19 declaration under Section 564(b)(1) of the Act, 21 U.S.C.section 360bbb-3(b)(1), unless the authorization is terminated  or revoked sooner.       Influenza A by PCR NEGATIVE NEGATIVE Final   Influenza B by PCR NEGATIVE NEGATIVE Final    Comment: (NOTE) The Xpert Xpress SARS-CoV-2/FLU/RSV plus assay is intended as an aid in the diagnosis of influenza from Nasopharyngeal swab specimens and should not be used as a sole basis for treatment. Nasal washings and aspirates are unacceptable for Xpert Xpress SARS-CoV-2/FLU/RSV testing.  Fact Sheet for Patients: bloggercourse.com  Fact Sheet for Healthcare Providers: seriousbroker.it  This test is not yet approved or cleared by the [place]  FDA and has been authorized for detection and/or diagnosis of SARS-CoV-2 by FDA under an Emergency Use Authorization (EUA). This EUA will remain in effect (meaning this test can be used) for the duration of the COVID-19 declaration under Section 564(b)(1) of  the Act, 21 U.S.C. section 360bbb-3(b)(1), unless the authorization is terminated or revoked.     Resp Syncytial Virus by PCR NEGATIVE NEGATIVE Final    Comment: (NOTE) Fact Sheet for Patients: bloggercourse.com  Fact Sheet for Healthcare  Providers: seriousbroker.it  This test is not yet approved or cleared by the [place]  FDA and has been authorized for detection and/or diagnosis of SARS-CoV-2 by FDA under an Emergency Use Authorization (EUA). This EUA will remain in effect (meaning this test can be used) for the duration of the COVID-19 declaration under Section 564(b)(1) of the Act, 21 U.S.C. section 360bbb-3(b)(1), unless the authorization is terminated or revoked.  Performed at [facility], [address] [place], [place] [address]   Urine Culture (for pregnant, neutropenic or urologic patients or patients with an indwelling urinary catheter)     Status: Abnormal   Collection Time: 04/14/24  1:11 PM   Specimen: Urine, Clean Catch  Result Value Ref Range Status   Specimen Description   Final    URINE, CLEAN CATCH Performed at [facility], [address] [place], [place] [address]    Special Requests   Final    NONE Performed at [facility], [address], [place], [place] [address]    Culture >=100,000 COLONIES/mL KLEBSIELLA PNEUMONIAE (A)  Final   Report Status 04/16/2024 FINAL  Final   Organism ID, Bacteria KLEBSIELLA PNEUMONIAE (A)  Final      Susceptibility   Klebsiella pneumoniae - MIC*    AMPICILLIN RESISTANT Resistant     CEFAZOLIN  (URINE) Value in next row Sensitive      <=1 SENSITIVEThis is a modified FDA-approved test that has been validated and its performance characteristics determined by the reporting laboratory.  This laboratory is certified under the Clinical Laboratory Improvement Amendments CLIA as qualified to perform high complexity clinical laboratory testing.    CEFEPIME Value in next row Sensitive      <=1 SENSITIVEThis is a modified FDA-approved test that has been validated and its performance characteristics determined by the reporting laboratory.  This laboratory is certified under the Clinical Laboratory Improvement  Amendments CLIA as qualified to perform high complexity clinical laboratory testing.    ERTAPENEM Value in next row Sensitive      <=1 SENSITIVEThis is a modified FDA-approved test that has been validated and its performance characteristics determined by the reporting laboratory.  This laboratory is certified under the Clinical Laboratory Improvement Amendments CLIA as qualified to perform high complexity clinical laboratory testing.    CEFTRIAXONE  Value in next row Sensitive      <=1 SENSITIVEThis is a modified FDA-approved test that has been validated and its performance characteristics determined by the reporting laboratory.  This laboratory is certified under the Clinical Laboratory Improvement Amendments CLIA as qualified to perform high complexity clinical laboratory testing.    CIPROFLOXACIN  Value in next row Sensitive      <=1 SENSITIVEThis is a modified FDA-approved test that has been validated and its performance characteristics determined by the reporting laboratory.  This laboratory is certified under the Clinical Laboratory Improvement Amendments CLIA as qualified to perform high complexity clinical laboratory testing.    GENTAMICIN Value in next row Sensitive      <=1 SENSITIVEThis is a modified FDA-approved test that has been validated and its performance characteristics determined by the reporting laboratory.  This laboratory is certified under the Clinical Laboratory Improvement Amendments CLIA as qualified to perform high complexity clinical laboratory testing.    NITROFURANTOIN  Value in next row Resistant      <=1 SENSITIVEThis is a modified FDA-approved test that has been validated and its performance characteristics determined by the reporting laboratory.  This laboratory is certified under the Clinical Laboratory Improvement Amendments CLIA as qualified to perform high complexity clinical laboratory testing.    TRIMETH/SULFA Value in next row Sensitive      <=1 SENSITIVEThis is a  modified FDA-approved test that has been validated and its performance characteristics determined by the reporting laboratory.  This laboratory is certified under the Clinical Laboratory Improvement Amendments CLIA as qualified to perform high complexity clinical laboratory testing.    AMPICILLIN/SULBACTAM Value in next row Sensitive      <=1 SENSITIVEThis is a modified FDA-approved test that has been validated and its performance characteristics determined by the reporting laboratory.  This laboratory is certified under the Clinical Laboratory Improvement Amendments CLIA as qualified to perform high complexity clinical laboratory testing.    PIP/TAZO Value in next row Sensitive      <=4 SENSITIVEThis is a modified FDA-approved test that has been validated and its performance characteristics determined by the reporting laboratory.  This laboratory is certified under the Clinical Laboratory Improvement Amendments CLIA as qualified to perform high complexity clinical laboratory testing.    MEROPENEM  Value in next row Sensitive      <=4 SENSITIVEThis is a modified FDA-approved test that has been validated and its performance characteristics determined by the reporting laboratory.  This laboratory is certified under the Clinical Laboratory Improvement Amendments CLIA as qualified to perform high complexity clinical laboratory testing.    * >=100,000 COLONIES/mL KLEBSIELLA PNEUMONIAE  MRSA Next Gen by PCR, Nasal     Status: None   Collection Time: 04/14/24  6:32 PM   Specimen: Nasal Mucosa; Nasal Swab  Result Value Ref Range Status   MRSA by PCR Next Gen NOT DETECTED NOT DETECTED Final    Comment: (NOTE) The GeneXpert MRSA Assay (FDA approved for NASAL specimens only), is one component of a comprehensive MRSA colonization surveillance program. It is not intended to diagnose MRSA infection nor to guide or monitor treatment for MRSA infections. Test performance is not FDA approved in patients less than 34  years old. Performed at [facility], [address] [place], [place] [address]     Procedures and diagnostic studies:  No results found.             LOS: 6 days   Johnnette Laux  [facility]   Pager on [email or web address]. If 7PM-7AM, please contact night-coverage at [email or web address]     04/20/2024, 2:16 PM           "

## 2024-04-21 DIAGNOSIS — E1110 Type 2 diabetes mellitus with ketoacidosis without coma: Secondary | ICD-10-CM | POA: Diagnosis not present

## 2024-04-21 LAB — GLUCOSE, CAPILLARY
Glucose-Capillary: 314 mg/dL — ABNORMAL HIGH (ref 70–99)
Glucose-Capillary: 362 mg/dL — ABNORMAL HIGH (ref 70–99)
Glucose-Capillary: 323 mg/dL — ABNORMAL HIGH (ref 70–99)
Glucose-Capillary: 267 mg/dL — ABNORMAL HIGH (ref 70–99)

## 2024-04-21 MED ORDER — INSULIN GLARGINE 100 UNIT/ML ~~LOC~~ SOLN
30.0000 [IU] | Freq: Every day | SUBCUTANEOUS | Status: DC
  Administered 2024-04-21: 30 [IU] via SUBCUTANEOUS
  Filled 2024-04-21: qty 0.3

## 2024-04-21 MED ORDER — PREGABALIN 150 MG PO CAPS: 150.0000 mg | ORAL_CAPSULE | Freq: Three times a day (TID) | ORAL | 0 refills | Status: AC

## 2024-04-21 MED ORDER — INSULIN ASPART 100 UNIT/ML IJ SOLN: 8.0000 [IU] | Freq: Three times a day (TID) | INTRAMUSCULAR | Status: AC

## 2024-04-21 MED ORDER — BASAGLAR KWIKPEN 100 UNIT/ML ~~LOC~~ SOPN: 40.0000 [IU] | PEN_INJECTOR | Freq: Every day | SUBCUTANEOUS | Status: AC

## 2024-04-21 MED ORDER — MIDODRINE HCL 5 MG PO TABS: 5.0000 mg | ORAL_TABLET | Freq: Three times a day (TID) | ORAL | Status: AC

## 2024-04-21 NOTE — TOC Transition Note (Addendum)
 Transition of Care [facility]) - Discharge Note   Patient Details  Name: [name] MRN: [id number] Date of Birth: [birth year]  [facility] CM/SW Contact:  Shasta DELENA Daring, RN Phone Number: 04/21/2024, 2:38 PM   Clinical Narrative:     Patient has discharge orders. Bed placement at [facility]. Transportation arranged via [employer] [facility]. Ride # [id number]  Phone # for mode of care:  [phone]  Patient and family aware.   No additional TOC needs  RNCM signing off.     Barriers to Discharge: Barriers Resolved   Patient Goals and CMS Choice            Discharge Placement              Patient chooses bed at: [facility] Patient to be transferred to facility by: [employer] Name of family member notified: AShley Patient and family notified of of transfer: 04/21/24  Discharge Plan and Services Additional resources added to the After Visit Summary for                                       Social Drivers of Health (SDOH) Interventions SDOH Screenings   Depression (PHQ2-9): Low Risk (11/28/2021)  Tobacco Use: Medium Risk (04/15/2024)     Readmission Risk Interventions    10/28/2021   10:28 AM  Readmission Risk Prevention Plan  Post Dischage Appt Complete  Medication Screening Complete  Transportation Screening Complete

## 2024-04-21 NOTE — Progress Notes (Signed)
 Patient non-compliant with diet restrictions for diabetes control. Noted eating snacks and putting sweetened cream in the coffee, drinks coffee regularly.  [name] V Duffy Dantonio

## 2024-04-21 NOTE — Progress Notes (Signed)
 "    Progress Note    [name]  FMW:[id number] DOB: 1965/06/09  DOA: 04/14/2024 PCP: [facility] Personal      Brief Narrative:    Medical records reviewed and are as summarized below:  [name] is a 59 y.o. female with medical history of hypertension, hyperlipidemia, type 2 diabetes, who was brought to the hospital by her mother because of confusion, somnolence and poor oral intake.  She was severely hyperglycemic with glucose of 957.     Assessment/Plan:   Principal Problem:   DKA (diabetic ketoacidosis) (HCC) Active Problems:   Hyperlipidemia   GERD (gastroesophageal reflux disease)   Fibromyalgia   Essential hypertension   CAD (coronary artery disease)   Uncontrolled type 2 diabetes mellitus with hyperglycemia, with long-term current use of insulin  (HCC)    Body mass index is 26.16 kg/m.   Type II DM with DKA: DKA resolved.  She is still hyperglycemic. Crease Lantus  from 25 units to 30 units with plan to increase to 35 to 40 units daily in the next day or 2.  Continue NovoLog  8 units 3 times daily with meals. Hemoglobin A1c 16.8   Acute metabolic encephalopathy: Resolved   AKI: Resolved   UTI: Urine culture showed Klebsiella. Completed 5 days of Rocephin .   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   Left humerus fracture: She was evaluated by orthopedic surgeon.  No plan for surgery.  Maintain sling.   Multiple falls at home, general weakness: PT recommended discharge to SNF   Orthostatic hypotension: Continue midodrine . History of hypertension.  Lisinopril  on hold   Comorbidities include fibromyalgia, hyperlipidemia, GERD  Diet Order             Diet Carb Modified Room service appropriate? Yes  Diet effective now                                  Consultants: Orthopedic  surgeon  Procedures: None    Medications:    atorvastatin   80 mg Oral QHS   Chlorhexidine  Gluconate Cloth  6 each Topical Q0600   DULoxetine   60 mg Oral QHS   enoxaparin  (LOVENOX ) injection  40 mg Subcutaneous Q24H   gabapentin   300 mg Oral TID   insulin  aspart  0-5 Units Subcutaneous QHS   insulin  aspart  0-9 Units Subcutaneous TID WC   insulin  aspart  8 Units Subcutaneous TID WC   insulin  glargine  30 Units Subcutaneous Daily   midodrine   5 mg Oral TID WC   naproxen   250 mg Oral TID WC   pantoprazole   40 mg Oral Daily   sodium chloride  flush  3 mL Intravenous Q12H   Continuous Infusions:     Anti-infectives (From admission, onward)    Start     Dose/Rate Route Frequency Ordered Stop   04/15/24 1600  cefTRIAXone  (ROCEPHIN ) 1 g in sodium chloride  0.9 % 100 mL IVPB        1 g 200 mL/hr over 30 Minutes Intravenous Every 24 hours 04/15/24 1509 04/20/24 1002              Family Communication/Anticipated D/C date and plan/Code Status   DVT prophylaxis: enoxaparin  (LOVENOX ) injection 40 mg Start: 04/17/24 1930     Code Status: Full Code  Family Communication: None Disposition Plan: Plan for discharge to SNF   Status is: Inpatient Remains  inpatient appropriate because: Awaiting placement to SNF       Subjective:   Interval events noted.  She still has some pain in the left shoulder and unable to move the left shoulder as much.  No other complaints.  Objective:    Vitals:   04/20/24 2005 04/21/24 0451 04/21/24 0454 04/21/24 0923  BP: (!) 113/51 (!) 107/52  (!) 103/53  Pulse: 60 64  64  Resp: 17 17  18   Temp: 98.2 F (36.8 C) 98.3 F (36.8 C)  98.2 F (36.8 C)  TempSrc: Oral Oral  Oral  SpO2: 96% 100%  98%  Weight:   71.3 kg   Height:       No data found.   Intake/Output Summary (Last 24 hours) at 04/21/2024 1102 Last data filed at 04/21/2024 0500 Gross per 24 hour  Intake 963 ml  Output 1050 ml  Net -87 ml   Filed Weights   04/17/24  0951 04/19/24 0303 04/21/24 0454  Weight: 73.1 kg 75.6 kg 71.3 kg    Exam:   GEN: NAD SKIN: Warm and dry EYES: No pallor or icterus ENT: MMM CV: RRR PULM: CTA B ABD: soft, ND, NT, +BS CNS: AAO x 3, non focal EXT: Left shoulder tenderness without swelling or erythema.  Limited range of motion of left shoulder joint.      Data Reviewed:   I have personally reviewed following labs and imaging studies:  Labs: Labs show the following:   Basic Metabolic Panel: Recent Labs  Lab 04/14/24 1311 04/14/24 1616 04/14/24 1956 04/14/24 2208 04/15/24 0318 04/15/24 9367 04/15/24 1548 04/16/24 0057 04/17/24 0646 04/18/24 0513  NA 140   < > 147* 151*   < > 145 147* 143 138 138  K 4.5   < > 3.2* 3.0*   < > 4.0 3.6 4.1 4.0 4.0  CL 100   < > 111 113*   < > 110 112* 108 105 103  CO2 10*   < > 21* 24   < > 29 27 27 24 26   GLUCOSE 957*   < > 467* 283*   < > 218* 95 241* 271* 233*  BUN 27*   < > 23* 21*   < > 17 14 13 16 17   CREATININE 1.60*   < > 1.26* 1.14*   < > 1.10* 1.01* 1.04* 0.92 0.95  CALCIUM  10.4*   < > 9.9 10.0   < > 9.1 8.7* 8.7* 9.0 8.9  MG 2.7*  --  2.3  --   --   --   --   --   --   --   PHOS  --   --   --  <1.0*  --  2.6  --   --   --   --    < > = values in this interval not displayed.   GFR Estimated Creatinine Clearance: 63.1 mL/min (by C-G formula based on SCr of 0.95 mg/dL). Liver Function Tests: Recent Labs  Lab 04/14/24 1311  AST <10*  ALT 12  ALKPHOS 208*  BILITOT 0.3  PROT 7.0  ALBUMIN  3.6   No results for input(s): LIPASE, AMYLASE in the last 168 hours. Recent Labs  Lab 04/14/24 1311  AMMONIA <13   Coagulation profile No results for input(s): INR, PROTIME in the last 168 hours.  CBC: Recent Labs  Lab 04/14/24 1311 04/15/24 0318 04/16/24 0057 04/17/24 0646 04/18/24 0513  WBC 16.7* 19.5* 14.1* 12.6* 11.4*  NEUTROABS 14.7*  --   --   --   --  HGB 11.1* 9.8* 9.3* 10.3* 10.6*  HCT 34.6* 28.9* 27.9* 30.3* 31.4*  MCV 98.9 92.0  95.5 94.1 94.3  PLT 806* 686* 560* 478* 412*   Cardiac Enzymes: Recent Labs  Lab 04/14/24 1311  CKTOTAL 69   BNP (last 3 results) No results for input(s): PROBNP in the last 8760 hours. CBG: Recent Labs  Lab 04/20/24 1208 04/20/24 1538 04/20/24 2217 04/21/24 0448 04/21/24 0925  GLUCAP 320* 241* 313* 314* 362*   D-Dimer: No results for input(s): DDIMER in the last 72 hours. Hgb A1c: No results for input(s): HGBA1C in the last 72 hours. Lipid Profile: No results for input(s): CHOL, HDL, LDLCALC, TRIG, CHOLHDL, LDLDIRECT in the last 72 hours. Thyroid  function studies: No results for input(s): TSH, T4TOTAL, T3FREE, THYROIDAB in the last 72 hours.  Invalid input(s): FREET3 Anemia work up: No results for input(s): VITAMINB12, FOLATE, FERRITIN, TIBC, IRON, RETICCTPCT in the last 72 hours. Sepsis Labs: Recent Labs  Lab 04/15/24 0016 04/15/24 0318 04/16/24 0057 04/17/24 0646 04/18/24 0513  WBC  --  19.5* 14.1* 12.6* 11.4*  LATICACIDVEN 1.7  --   --   --   --     Microbiology Recent Results (from the past 240 hours)  Resp panel by RT-PCR (RSV, Flu A&B, Covid) Anterior Nasal Swab     Status: None   Collection Time: 04/14/24  1:11 PM   Specimen: Anterior Nasal Swab  Result Value Ref Range Status   SARS Coronavirus 2 by RT PCR NEGATIVE NEGATIVE Final    Comment: (NOTE) SARS-CoV-2 target nucleic acids are NOT DETECTED.  The SARS-CoV-2 RNA is generally detectable in upper respiratory specimens during the acute phase of infection. The lowest concentration of SARS-CoV-2 viral copies this assay can detect is 138 copies/mL. A negative result does not preclude SARS-Cov-2 infection and should not be used as the sole basis for treatment or other patient management decisions. A negative result may occur with  improper specimen collection/handling, submission of specimen other than nasopharyngeal swab, presence of viral mutation(s) within  the areas targeted by this assay, and inadequate number of viral copies(<138 copies/mL). A negative result must be combined with clinical observations, patient history, and epidemiological information. The expected result is Negative.  Fact Sheet for Patients:  bloggercourse.com  Fact Sheet for Healthcare Providers:  seriousbroker.it  This test is no t yet approved or cleared by the [place]  FDA and  has been authorized for detection and/or diagnosis of SARS-CoV-2 by FDA under an Emergency Use Authorization (EUA). This EUA will remain  in effect (meaning this test can be used) for the duration of the COVID-19 declaration under Section 564(b)(1) of the Act, 21 U.S.C.section 360bbb-3(b)(1), unless the authorization is terminated  or revoked sooner.       Influenza A by PCR NEGATIVE NEGATIVE Final   Influenza B by PCR NEGATIVE NEGATIVE Final    Comment: (NOTE) The Xpert Xpress SARS-CoV-2/FLU/RSV plus assay is intended as an aid in the diagnosis of influenza from Nasopharyngeal swab specimens and should not be used as a sole basis for treatment. Nasal washings and aspirates are unacceptable for Xpert Xpress SARS-CoV-2/FLU/RSV testing.  Fact Sheet for Patients: bloggercourse.com  Fact Sheet for Healthcare Providers: seriousbroker.it  This test is not yet approved or cleared by the [place]  FDA and has been authorized for detection and/or diagnosis of SARS-CoV-2 by FDA under an Emergency Use Authorization (EUA). This EUA will remain in effect (meaning this test can be used) for the duration of the COVID-19  declaration under Section 564(b)(1) of the Act, 21 U.S.C. section 360bbb-3(b)(1), unless the authorization is terminated or revoked.     Resp Syncytial Virus by PCR NEGATIVE NEGATIVE Final    Comment: (NOTE) Fact Sheet for  Patients: bloggercourse.com  Fact Sheet for Healthcare Providers: seriousbroker.it  This test is not yet approved or cleared by the [place]  FDA and has been authorized for detection and/or diagnosis of SARS-CoV-2 by FDA under an Emergency Use Authorization (EUA). This EUA will remain in effect (meaning this test can be used) for the duration of the COVID-19 declaration under Section 564(b)(1) of the Act, 21 U.S.C. section 360bbb-3(b)(1), unless the authorization is terminated or revoked.  Performed at [facility], [address], [place], [place] [address]   Urine Culture (for pregnant, neutropenic or urologic patients or patients with an indwelling urinary catheter)     Status: Abnormal   Collection Time: 04/14/24  1:11 PM   Specimen: Urine, Clean Catch  Result Value Ref Range Status   Specimen Description   Final    URINE, CLEAN CATCH Performed at [facility], [address], [place], [place] [address]    Special Requests   Final    NONE Performed at [facility], [address], [place], [place] [address]    Culture >=100,000 COLONIES/mL KLEBSIELLA PNEUMONIAE (A)  Final   Report Status 04/16/2024 FINAL  Final   Organism ID, Bacteria KLEBSIELLA PNEUMONIAE (A)  Final      Susceptibility   Klebsiella pneumoniae - MIC*    AMPICILLIN RESISTANT Resistant     CEFAZOLIN  (URINE) Value in next row Sensitive      <=1 SENSITIVEThis is a modified FDA-approved test that has been validated and its performance characteristics determined by the reporting laboratory.  This laboratory is certified under the Clinical Laboratory Improvement Amendments CLIA as qualified to perform high complexity clinical laboratory testing.    CEFEPIME Value in next row Sensitive      <=1 SENSITIVEThis is a modified FDA-approved test that has been validated and its performance characteristics determined by the reporting  laboratory.  This laboratory is certified under the Clinical Laboratory Improvement Amendments CLIA as qualified to perform high complexity clinical laboratory testing.    ERTAPENEM Value in next row Sensitive      <=1 SENSITIVEThis is a modified FDA-approved test that has been validated and its performance characteristics determined by the reporting laboratory.  This laboratory is certified under the Clinical Laboratory Improvement Amendments CLIA as qualified to perform high complexity clinical laboratory testing.    CEFTRIAXONE  Value in next row Sensitive      <=1 SENSITIVEThis is a modified FDA-approved test that has been validated and its performance characteristics determined by the reporting laboratory.  This laboratory is certified under the Clinical Laboratory Improvement Amendments CLIA as qualified to perform high complexity clinical laboratory testing.    CIPROFLOXACIN  Value in next row Sensitive      <=1 SENSITIVEThis is a modified FDA-approved test that has been validated and its performance characteristics determined by the reporting laboratory.  This laboratory is certified under the Clinical Laboratory Improvement Amendments CLIA as qualified to perform high complexity clinical laboratory testing.    GENTAMICIN Value in next row Sensitive      <=1 SENSITIVEThis is a modified FDA-approved test that has been validated and its performance characteristics determined by the reporting laboratory.  This laboratory is certified under the Clinical Laboratory Improvement Amendments CLIA as qualified to perform high complexity clinical laboratory  testing.    NITROFURANTOIN Value in next row Resistant      <=1 SENSITIVEThis is a modified FDA-approved test that has been validated and its performance characteristics determined by the reporting laboratory.  This laboratory is certified under the Clinical Laboratory Improvement Amendments CLIA as qualified to perform high complexity clinical laboratory  testing.    TRIMETH/SULFA Value in next row Sensitive      <=1 SENSITIVEThis is a modified FDA-approved test that has been validated and its performance characteristics determined by the reporting laboratory.  This laboratory is certified under the Clinical Laboratory Improvement Amendments CLIA as qualified to perform high complexity clinical laboratory testing.    AMPICILLIN/SULBACTAM Value in next row Sensitive      <=1 SENSITIVEThis is a modified FDA-approved test that has been validated and its performance characteristics determined by the reporting laboratory.  This laboratory is certified under the Clinical Laboratory Improvement Amendments CLIA as qualified to perform high complexity clinical laboratory testing.    PIP/TAZO Value in next row Sensitive      <=4 SENSITIVEThis is a modified FDA-approved test that has been validated and its performance characteristics determined by the reporting laboratory.  This laboratory is certified under the Clinical Laboratory Improvement Amendments CLIA as qualified to perform high complexity clinical laboratory testing.    MEROPENEM  Value in next row Sensitive      <=4 SENSITIVEThis is a modified FDA-approved test that has been validated and its performance characteristics determined by the reporting laboratory.  This laboratory is certified under the Clinical Laboratory Improvement Amendments CLIA as qualified to perform high complexity clinical laboratory testing.    * >=100,000 COLONIES/mL KLEBSIELLA PNEUMONIAE  MRSA Next Gen by PCR, Nasal     Status: None   Collection Time: 04/14/24  6:32 PM   Specimen: Nasal Mucosa; Nasal Swab  Result Value Ref Range Status   MRSA by PCR Next Gen NOT DETECTED NOT DETECTED Final    Comment: (NOTE) The GeneXpert MRSA Assay (FDA approved for NASAL specimens only), is one component of a comprehensive MRSA colonization surveillance program. It is not intended to diagnose MRSA infection nor to guide or monitor treatment  for MRSA infections. Test performance is not FDA approved in patients less than 74 years old. Performed at [facility], [address] [place], [place] [address]     Procedures and diagnostic studies:  No results found.             LOS: 7 days   Simmone Cape  [facility]   Pager on [email or web address]. If 7PM-7AM, please contact night-coverage at [email or web address]     04/21/2024, 11:02 AM           "

## 2024-04-21 NOTE — Progress Notes (Signed)
 Patient notified charge nurse that she received a text message cancelling her ride. She stated she did not see previous messages that stated she should be at the curb. No staff was notified of the above. Notified MD of above. Messaged Jami with TOC and requested assistance.

## 2024-04-21 NOTE — Discharge Summary (Signed)
 " Physician Discharge Summary   Patient: [name] MRN: [id number] DOB: 20-Apr-1966  Admit date:     04/14/2024  Discharge date: 04/21/2024  Discharge Physician: AIDA CHO   PCP: [facility]   Recommendations at discharge:   Follow-up with physician in the next room within 3 days of discharge Follow-up with orthopedic surgeon in 2 weeks.  Discharge Diagnoses: Principal Problem:   DKA (diabetic ketoacidosis) (HCC) Active Problems:   Hyperlipidemia   GERD (gastroesophageal reflux disease)   Fibromyalgia   Essential hypertension   CAD (coronary artery disease)   Uncontrolled type 2 diabetes mellitus with hyperglycemia, with long-term current use of insulin  (HCC)  Resolved Problems:   * No resolved hospital problems. [facility] Course  [name] is a 59 y.o. female with medical history of hypertension, hyperlipidemia, type 2 diabetes, who was brought to the hospital by her mother because of confusion, somnolence and poor oral intake.   She was severely hyperglycemic with glucose of 957.   Assessment and Plan:  Type II DM with DKA: DKA resolved.  She is still hyperglycemic. She will be discharged on Basaglar  40 units daily.  Continue NovoLog  8 units 3 times daily and NovoLog  as needed per sliding scale. Patient confirmed she was taking Basaglar  35 units daily in addition to sliding scale insulin  at home. Hemoglobin A1c 16.8     Acute metabolic encephalopathy: Resolved     AKI: Resolved     UTI: Urine culture showed Klebsiella. Completed 5 days of Rocephin .     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     Left humerus fracture: She was evaluated by orthopedic surgeon.  No plan for surgery.  Maintain sling.  Outpatient follow-up with orthopedic surgeon in 2 weeks     Multiple falls at home, general weakness: PT recommended discharge to  SNF     Orthostatic hypotension: Continue midodrine . History of hypertension.  Lisinopril  had been on hold in the hospital.  Discontinue lisinopril  at discharge     Comorbidities include fibromyalgia, hyperlipidemia, GERD    Her condition has improved and she is deemed stable for discharge to SNF today.      Consultants: Orthopedic surgeon Procedures performed: None Disposition: Skilled nursing facility Diet recommendation:  Cardiac and Carb modified diet DISCHARGE MEDICATION: Allergies as of 04/21/2024       Reactions   Aspartame And Phenylalanine Anaphylaxis, Shortness Of Breath, Swelling   Throat swells  ANY ARTIFICAL SWEETNERS   Hydromorphone  Hives   Sucralose Rash, Dermatitis   Pt.s face broke out in rash after drinking Breeza.  Pt. Claims it is from the artificial sweetener. sucralose   Toradol  [ketorolac  Tromethamine ] Other (See Comments)   Face & neck flushed red & pt felt very hot after IV adm.   Codeine Swelling   Dilaudid  [hydromorphone  Hcl] Itching   Hydrocodone  Itching   Oxycodone  Itching        Medication List     STOP taking these medications    lisinopril  10 MG tablet Commonly known as: ZESTRIL    PROBIOTIC PO   traMADol  50 MG tablet Commonly known as: Ultram        TAKE these medications    acetaminophen  325 MG tablet Commonly known as: TYLENOL  Take 2 tablets (650 mg total) by mouth every 6 (six) hours as needed for mild pain (or Fever >/= 101).   albuterol  108 (90 Base) MCG/ACT inhaler Commonly known as: VENTOLIN  HFA Inhale  2 puffs into the lungs every 4 (four) hours as needed for shortness of breath or wheezing.   atorvastatin  80 MG tablet Commonly known as: LIPITOR  Take 80 mg by mouth at bedtime.   Basaglar  KwikPen 100 UNIT/ML Inject 40 Units into the skin daily. Start taking on: April 22, 2024 What changed:  how much to take when to take this   DULoxetine  60 MG capsule Commonly known as: CYMBALTA  Take 60 mg by mouth at  bedtime.   Jardiance 10 MG Tabs tablet Generic drug: empagliflozin Take 10 mg by mouth daily.   meloxicam 15 MG tablet Commonly known as: MOBIC Take 15 mg by mouth daily.   metFORMIN  1000 MG tablet Commonly known as: GLUCOPHAGE  Take 1,000 mg by mouth 2 (two) times daily.   midodrine  5 MG tablet Commonly known as: PROAMATINE  Take 1 tablet (5 mg total) by mouth 3 (three) times daily with meals.   NovoLOG  FlexPen 100 UNIT/ML FlexPen Generic drug: insulin  aspart Inject 1-15 Units into the skin 3 (three) times daily with meals. CBG 121 - 150: 2 units  CBG 151 - 200: 3 units  CBG 201 - 250: 5 units  CBG 251 - 300: 8 units  CBG 301 - 350: 11 units  CBG 351 - 400: 15 units What changed: Another medication with the same name was added. Make sure you understand how and when to take each.   insulin  aspart 100 UNIT/ML injection Commonly known as: novoLOG  Inject 8 Units into the skin 3 (three) times daily with meals. What changed: You were already taking a medication with the same name, and this prescription was added. Make sure you understand how and when to take each.   pantoprazole  40 MG tablet Commonly known as: PROTONIX  Take 1 tablet (40 mg total) by mouth daily.   pregabalin  150 MG capsule Commonly known as: LYRICA  Take 150 mg by mouth 3 (three) times daily.   Vitamin D3 1.25 MG (50000 UT) Caps Take 50,000 Units by mouth every Sunday.               Discharge Care Instructions  (From admission, onward)           Start     Ordered   04/21/24 0000  Discharge wound care:       Comments: Apply Mepilex border to coccyx wound   04/21/24 1348            Contact information for after-discharge care     Destination     [facility] .   Service: Skilled Nursing Contact information: [address] [place]  [address] [phone]                    Discharge Exam: Filed Weights   04/17/24 0951 04/19/24 0303 04/21/24  0454  Weight: 73.1 kg 75.6 kg 71.3 kg   GEN: NAD SKIN: Warm and dry EYES: No pallor or icterus ENT: MMM CV: RRR PULM: CTA B ABD: soft, ND, NT, +BS CNS: AAO x 3, non focal EXT: Left shoulder tenderness.  Limited range of motion of left shoulder joint.  No swelling or erythema.   Condition at discharge: good  The results of significant diagnostics from this hospitalization (including imaging, microbiology, ancillary and laboratory) are listed below for reference.   Imaging Studies: ECHOCARDIOGRAM COMPLETE Result Date: 04/16/2024    ECHOCARDIOGRAM REPORT   Patient Name:   MARLANA MCKOWEN Date of Exam: 04/16/2024 Medical Rec #:  [id number]  Height:       65.0 in Accession #:    [id number]      Weight:       160.1 lb Date of Birth:  [birth year]       BSA:          1.799 m Patient Age:    58 years        BP:           110/45 mmHg Patient Gender: F               HR:           71 bpm. Exam Location:  [facility] Procedure: 2D Echo, Color Doppler and Cardiac Doppler (Both Spectral and Color            Flow Doppler were utilized during procedure). Indications:     R55 Syncope  History:         Patient has prior history of Echocardiogram examinations.                  Previous Myocardial Infarction; Risk Factors:Hypertension,                  Diabetes and Dyslipidemia.  Sonographer:     L. Thornton-Maynard, RDCS Referring Phys:  [id number] LANETA BLUNT Diagnosing Phys: Cara JONETTA Lovelace MD  Sonographer Comments: Global longitudinal strain was attempted. IMPRESSIONS  1. Left ventricular ejection fraction, by estimation, is 60 to 65%. The left ventricle has normal function. The left ventricle has no regional wall motion abnormalities. Left ventricular diastolic parameters are consistent with Grade I diastolic dysfunction (impaired relaxation). The average left ventricular global longitudinal strain is 18.0 %. The global longitudinal strain is normal.  2. Right ventricular systolic function is normal. The right  ventricular size is normal. There is normal pulmonary artery systolic pressure.  3. The mitral valve is normal in structure. Trivial mitral valve regurgitation.  4. The aortic valve is normal in structure. Aortic valve regurgitation is not visualized. FINDINGS  Left Ventricle: Left ventricular ejection fraction, by estimation, is 60 to 65%. The left ventricle has normal function. The left ventricle has no regional wall motion abnormalities. The average left ventricular global longitudinal strain is 18.0 %. Strain was performed and the global longitudinal strain is normal. The left ventricular internal cavity size was normal in size. There is borderline left ventricular hypertrophy. Left ventricular diastolic parameters are consistent with Grade I diastolic  dysfunction (impaired relaxation). Right Ventricle: The right ventricular size is normal. No increase in right ventricular wall thickness. Right ventricular systolic function is normal. There is normal pulmonary artery systolic pressure. The tricuspid regurgitant velocity is 1.67 m/s, and  with an assumed right atrial pressure of 3 mmHg, the estimated right ventricular systolic pressure is 14.2 mmHg. Left Atrium: Left atrial size was normal in size. Right Atrium: Right atrial size was normal in size. Pericardium: There is no evidence of pericardial effusion. Mitral Valve: The mitral valve is normal in structure. Trivial mitral valve regurgitation. Tricuspid Valve: The tricuspid valve is normal in structure. Tricuspid valve regurgitation is trivial. Aortic Valve: The aortic valve is normal in structure. Aortic valve regurgitation is not visualized. Aortic valve mean gradient measures 5.5 mmHg. Aortic valve peak gradient measures 9.7 mmHg. Aortic valve area, by VTI measures 1.78 cm. Pulmonic Valve: The pulmonic valve was not well visualized. Pulmonic valve regurgitation is not visualized. Aorta: The ascending aorta was not well visualized. IAS/Shunts: No atrial  level shunt detected by color  flow Doppler. Additional Comments: 3D was performed not requiring image post processing on an independent workstation and was indeterminate.  LEFT VENTRICLE PLAX 2D LVIDd:         4.50 cm     Diastology LVIDs:         2.90 cm     LV e' medial:    7.94 cm/s LV PW:         1.00 cm     LV E/e' medial:  10.1 LV IVS:        1.00 cm     LV e' lateral:   11.20 cm/s LVOT diam:     2.00 cm     LV E/e' lateral: 7.2 LV SV:         54 LV SV Index:   30          2D Longitudinal Strain LVOT Area:     3.14 cm    2D Strain GLS (A4C):   19.9 % LV IVRT:       85 msec     2D Strain GLS (A3C):   15.4 %                            2D Strain GLS (A2C):   18.6 %                            2D Strain GLS Avg:     18.0 % LV Volumes (MOD) LV vol d, MOD A2C: 52.4 ml LV vol d, MOD A4C: 81.0 ml LV vol s, MOD A2C: 25.2 ml LV vol s, MOD A4C: 27.1 ml LV SV MOD A2C:     27.2 ml LV SV MOD A4C:     81.0 ml LV SV MOD BP:      39.9 ml RIGHT VENTRICLE RV Basal diam:  2.40 cm RV Mid diam:    2.10 cm RV S prime:     10.00 cm/s TAPSE (M-mode): 1.6 cm LEFT ATRIUM             Index        RIGHT ATRIUM          Index LA diam:        2.50 cm 1.39 cm/m   RA Area:     9.56 cm LA Vol (A2C):   48.1 ml 26.73 ml/m  RA Volume:   18.20 ml 10.11 ml/m LA Vol (A4C):   26.1 ml 14.50 ml/m LA Biplane Vol: 35.9 ml 19.95 ml/m  AORTIC VALVE                     PULMONIC VALVE AV Area (Vmax):    2.01 cm      PV Vmax:       0.94 m/s AV Area (Vmean):   1.90 cm      PV Peak grad:  3.5 mmHg AV Area (VTI):     1.78 cm AV Vmax:           156.00 cm/s AV Vmean:          106.500 cm/s AV VTI:            0.302 m AV Peak Grad:      9.7 mmHg AV Mean Grad:      5.5 mmHg LVOT Vmax:         100.00 cm/s LVOT Vmean:  64.500 cm/s LVOT VTI:          0.171 m LVOT/AV VTI ratio: 0.57  AORTA Ao Root diam: 3.10 cm Ao Asc diam:  3.10 cm MITRAL VALVE               TRICUSPID VALVE MV Area (PHT): 4.31 cm    TR Peak grad:   11.2 mmHg MV Decel Time: 176 msec    TR  Vmax:        167.00 cm/s MV E velocity: 80.20 cm/s MV A velocity: 84.90 cm/s  SHUNTS MV E/A ratio:  0.94        Systemic VTI:  0.17 m                            Systemic Diam: 2.00 cm Cara JONETTA Lovelace MD Electronically signed by Cara JONETTA Lovelace MD Signature Date/Time: 04/16/2024/11:15:53 AM    Final    MR BRAIN WO CONTRAST Result Date: 04/15/2024 EXAM: MRI BRAIN WITHOUT CONTRAST 04/15/2024 02:43:23 PM TECHNIQUE: Multiplanar multisequence MRI of the head/brain was performed without the administration of intravenous contrast. COMPARISON: CT head 04/14/2024 and MRI head 10/24/2021. CLINICAL HISTORY: Neuro deficit, acute, stroke suspected. FINDINGS: BRAIN AND VENTRICLES: No acute infarct. No intracranial hemorrhage. No mass. No midline shift. No hydrocephalus. Mild areas of T2 and FLAIR hyperintensity in the periventricular white matter which are slightly increased since the 2023 MRI suggestive of mild chronic microvascular ischemic changes. 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 The sella is unremarkable. Normal flow voids. ORBITS: No acute abnormality. SINUSES AND MASTOIDS: Trace fluid in the mastoid air cells. BONES AND SOFT TISSUES: Normal marrow signal. No acute soft tissue abnormality. IMPRESSION: 1. No acute intracranial abnormality. 2. 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 3. Mild chronic microvascular ischemic changes, slightly increased since the 2023 MRI. Electronically signed by: Donnice Mania MD 04/15/2024 03:43 PM EST RP Workstation: [id number]   DG Humerus Left Result Date: 04/15/2024 CLINICAL DATA:  Left humeral fracture. EXAM: LEFT HUMERUS - 2+ VIEW COMPARISON:  Radiograph 03/30/2024 FINDINGS: Left proximal humerus fracture demonstrates increased displacement from prior exam. Osseous overriding of 7-8 mm. There is progressive apex lateral angulation. No  internal or peripheral callus formation. No evidence of glenohumeral dislocation. The distal humerus is intact. Mild soft tissue edema persists. IMPRESSION: Increased displacement and angulation of left proximal humerus fracture. No internal or peripheral callus formation. Electronically Signed   By: Andrea Gasman M.D.   On: 04/15/2024 13:34   CT ABDOMEN PELVIS WO CONTRAST Result Date: 04/14/2024 CLINICAL DATA:  Acute generalized abdominal pain EXAM: CT ABDOMEN AND PELVIS WITHOUT CONTRAST TECHNIQUE: Multidetector CT imaging of the abdomen and pelvis was performed following the standard protocol without IV contrast. RADIATION DOSE REDUCTION: This exam was performed according to the departmental dose-optimization program which includes automated exposure control, adjustment of the mA and/or kV according to patient size and/or use of iterative reconstruction technique. COMPARISON:  November 10, 2021 FINDINGS: Lower chest: No acute abnormality. Hepatobiliary: No focal liver abnormality is seen. No gallstones, gallbladder wall thickening, or biliary dilatation. 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 No pancreatic ductal dilatation or surrounding inflammatory changes. Spleen: Status post splenectomy. Adrenals/Urinary Tract: Adrenal glands are unremarkable. Mild right renal atrophy is noted. No hydronephrosis or renal obstruction is noted. Bladder is unremarkable. Stomach/Bowel: Stomach  is within normal limits. Appendix appears normal. No evidence of bowel wall thickening, distention, or inflammatory changes. Vascular/Lymphatic: Aortic atherosclerosis. No enlarged abdominal or pelvic lymph nodes. Reproductive: Status post hysterectomy. No adnexal masses. Other: No abdominal wall hernia or abnormality. No abdominopelvic ascites. Musculoskeletal: No acute or significant osseous findings. IMPRESSION: 1. Stable findings consistent with chronic portal venous  cavernous transformation. 2. Mild right renal atrophy. 3. No acute abnormality seen in the abdomen or pelvis. 4. Aortic atherosclerosis. Aortic Atherosclerosis (ICD10-I70.0). Electronically Signed   By: Lynwood Landy Raddle M.D.   On: 04/14/2024 14:13   DG Chest Portable 1 View Result Date: 04/14/2024 CLINICAL DATA:  Altered mental status EXAM: PORTABLE CHEST 1 VIEW COMPARISON:  October 09, 2021 FINDINGS: The heart size and mediastinal contours are within normal limits. Both lungs are clear. Moderately displaced and comminuted fracture is seen involving the proximal left humerus. IMPRESSION: No active cardiopulmonary disease. Moderately displaced and comminuted proximal left humeral fracture. Electronically Signed   By: Lynwood Landy Raddle M.D.   On: 04/14/2024 14:06   CT HEAD WO CONTRAST ( ) Result Date: 04/14/2024 EXAM: CT HEAD WITHOUT CONTRAST 04/14/2024 01:40:08 PM TECHNIQUE: CT of the head was performed without the administration of intravenous contrast. Automated exposure control, iterative reconstruction, and/or weight based adjustment of the mA/kV was utilized to reduce the radiation dose to as low as reasonably achievable. COMPARISON: CT head 03/29/2014. MRI brain 10/24/2021. CLINICAL HISTORY: Head trauma, abnormal mental status (Age 59-64y). FINDINGS: BRAIN AND VENTRICLES: No acute hemorrhage. No evidence of acute infarct. No hydrocephalus. No extra-axial collection. No mass effect or midline shift. ORBITS: No acute abnormality. SINUSES: No acute abnormality. SOFT TISSUES AND SKULL: No acute soft tissue abnormality. No skull fracture. IMPRESSION: 1. No acute intracranial abnormality. Electronically signed by: Ryan Chess MD 04/14/2024 01:45 PM EST RP Workstation: [id number]   DG Humerus Left Result Date: 03/30/2024 CLINICAL DATA:  Fall.  Pain. EXAM: LEFT HUMERUS - 2+ VIEW COMPARISON:  None Available. FINDINGS: Comminuted fracture of the humeral neck with mild angulation. No other fracture evident.  IMPRESSION: Comminuted fracture of the humeral neck with mild angulation. Electronically Signed   By: Camellia Candle M.D.   On: 03/30/2024 08:42   DG Shoulder Left Result Date: 03/30/2024 CLINICAL DATA:  Fall.  Left arm pain. EXAM: LEFT SHOULDER - 2+ VIEW COMPARISON:  None Available. FINDINGS: Comminuted fracture of the left humeral neck evident with mild angulation. No dislocation of the humeral head fragment. No findings to suggest shoulder separation. IMPRESSION: Comminuted fracture of the left humeral neck with mild angulation. Electronically Signed   By: Camellia Candle M.D.   On: 03/30/2024 08:39    Microbiology: Results for orders placed or performed during the hospital encounter of 04/14/24  Resp panel by RT-PCR (RSV, Flu A&B, Covid) Anterior Nasal Swab     Status: None   Collection Time: 04/14/24  1:11 PM   Specimen: Anterior Nasal Swab  Result Value Ref Range Status   SARS Coronavirus 2 by RT PCR NEGATIVE NEGATIVE Final    Comment: (NOTE) SARS-CoV-2 target nucleic acids are NOT DETECTED.  The SARS-CoV-2 RNA is generally detectable in upper respiratory specimens during the acute phase of infection. The lowest concentration of SARS-CoV-2 viral copies this assay can detect is 138 copies/mL. A negative result does not preclude SARS-Cov-2 infection and should not be used as the sole basis for treatment or other patient management decisions. A negative result may occur with  improper specimen collection/handling, submission of specimen other than  nasopharyngeal swab, presence of viral mutation(s) within the areas targeted by this assay, and inadequate number of viral copies(<138 copies/mL). A negative result must be combined with clinical observations, patient history, and epidemiological information. The expected result is Negative.  Fact Sheet for Patients:  bloggercourse.com  Fact Sheet for Healthcare Providers:   seriousbroker.it  This test is no t yet approved or cleared by the [place]  FDA and  has been authorized for detection and/or diagnosis of SARS-CoV-2 by FDA under an Emergency Use Authorization (EUA). This EUA will remain  in effect (meaning this test can be used) for the duration of the COVID-19 declaration under Section 564(b)(1) of the Act, 21 U.S.C.section 360bbb-3(b)(1), unless the authorization is terminated  or revoked sooner.       Influenza A by PCR NEGATIVE NEGATIVE Final   Influenza B by PCR NEGATIVE NEGATIVE Final    Comment: (NOTE) The Xpert Xpress SARS-CoV-2/FLU/RSV plus assay is intended as an aid in the diagnosis of influenza from Nasopharyngeal swab specimens and should not be used as a sole basis for treatment. Nasal washings and aspirates are unacceptable for Xpert Xpress SARS-CoV-2/FLU/RSV testing.  Fact Sheet for Patients: bloggercourse.com  Fact Sheet for Healthcare Providers: seriousbroker.it  This test is not yet approved or cleared by the [place]  FDA and has been authorized for detection and/or diagnosis of SARS-CoV-2 by FDA under an Emergency Use Authorization (EUA). This EUA will remain in effect (meaning this test can be used) for the duration of the COVID-19 declaration under Section 564(b)(1) of the Act, 21 U.S.C. section 360bbb-3(b)(1), unless the authorization is terminated or revoked.     Resp Syncytial Virus by PCR NEGATIVE NEGATIVE Final    Comment: (NOTE) Fact Sheet for Patients: bloggercourse.com  Fact Sheet for Healthcare Providers: seriousbroker.it  This test is not yet approved or cleared by the [place]  FDA and has been authorized for detection and/or diagnosis of SARS-CoV-2 by FDA under an Emergency Use Authorization (EUA). This EUA will remain in effect (meaning this test can be used) for  the duration of the COVID-19 declaration under Section 564(b)(1) of the Act, 21 U.S.C. section 360bbb-3(b)(1), unless the authorization is terminated or revoked.  Performed at [facility], [address], [place], [place] [address]   Urine Culture (for pregnant, neutropenic or urologic patients or patients with an indwelling urinary catheter)     Status: Abnormal   Collection Time: 04/14/24  1:11 PM   Specimen: Urine, Clean Catch  Result Value Ref Range Status   Specimen Description   Final    URINE, CLEAN CATCH Performed at [facility], [address] [place], [place] [address]    Special Requests   Final    NONE Performed at [facility], [address] [place], [place] [address]    Culture >=100,000 COLONIES/mL KLEBSIELLA PNEUMONIAE (A)  Final   Report Status 04/16/2024 FINAL  Final   Organism ID, Bacteria KLEBSIELLA PNEUMONIAE (A)  Final      Susceptibility   Klebsiella pneumoniae - MIC*    AMPICILLIN RESISTANT Resistant     CEFAZOLIN  (URINE) Value in next row Sensitive      <=1 SENSITIVEThis is a modified FDA-approved test that has been validated and its performance characteristics determined by the reporting laboratory.  This laboratory is certified under the Clinical Laboratory Improvement Amendments CLIA as qualified to perform high complexity clinical laboratory testing.    CEFEPIME Value in next row Sensitive      <=1 SENSITIVEThis  is a modified FDA-approved test that has been validated and its performance characteristics determined by the reporting laboratory.  This laboratory is certified under the Clinical Laboratory Improvement Amendments CLIA as qualified to perform high complexity clinical laboratory testing.    ERTAPENEM Value in next row Sensitive      <=1 SENSITIVEThis is a modified FDA-approved test that has been validated and its performance characteristics determined by the reporting laboratory.  This laboratory is certified under  the Clinical Laboratory Improvement Amendments CLIA as qualified to perform high complexity clinical laboratory testing.    CEFTRIAXONE  Value in next row Sensitive      <=1 SENSITIVEThis is a modified FDA-approved test that has been validated and its performance characteristics determined by the reporting laboratory.  This laboratory is certified under the Clinical Laboratory Improvement Amendments CLIA as qualified to perform high complexity clinical laboratory testing.    CIPROFLOXACIN  Value in next row Sensitive      <=1 SENSITIVEThis is a modified FDA-approved test that has been validated and its performance characteristics determined by the reporting laboratory.  This laboratory is certified under the Clinical Laboratory Improvement Amendments CLIA as qualified to perform high complexity clinical laboratory testing.    GENTAMICIN Value in next row Sensitive      <=1 SENSITIVEThis is a modified FDA-approved test that has been validated and its performance characteristics determined by the reporting laboratory.  This laboratory is certified under the Clinical Laboratory Improvement Amendments CLIA as qualified to perform high complexity clinical laboratory testing.    NITROFURANTOIN Value in next row Resistant      <=1 SENSITIVEThis is a modified FDA-approved test that has been validated and its performance characteristics determined by the reporting laboratory.  This laboratory is certified under the Clinical Laboratory Improvement Amendments CLIA as qualified to perform high complexity clinical laboratory testing.    TRIMETH/SULFA Value in next row Sensitive      <=1 SENSITIVEThis is a modified FDA-approved test that has been validated and its performance characteristics determined by the reporting laboratory.  This laboratory is certified under the Clinical Laboratory Improvement Amendments CLIA as qualified to perform high complexity clinical laboratory testing.    AMPICILLIN/SULBACTAM Value in  next row Sensitive      <=1 SENSITIVEThis is a modified FDA-approved test that has been validated and its performance characteristics determined by the reporting laboratory.  This laboratory is certified under the Clinical Laboratory Improvement Amendments CLIA as qualified to perform high complexity clinical laboratory testing.    PIP/TAZO Value in next row Sensitive      <=4 SENSITIVEThis is a modified FDA-approved test that has been validated and its performance characteristics determined by the reporting laboratory.  This laboratory is certified under the Clinical Laboratory Improvement Amendments CLIA as qualified to perform high complexity clinical laboratory testing.    MEROPENEM  Value in next row Sensitive      <=4 SENSITIVEThis is a modified FDA-approved test that has been validated and its performance characteristics determined by the reporting laboratory.  This laboratory is certified under the Clinical Laboratory Improvement Amendments CLIA as qualified to perform high complexity clinical laboratory testing.    * >=100,000 COLONIES/mL KLEBSIELLA PNEUMONIAE  MRSA Next Gen by PCR, Nasal     Status: None   Collection Time: 04/14/24  6:32 PM   Specimen: Nasal Mucosa; Nasal Swab  Result Value Ref Range Status   MRSA by PCR Next Gen NOT DETECTED NOT DETECTED Final    Comment: (NOTE) The GeneXpert MRSA Assay (FDA  approved for NASAL specimens only), is one component of a comprehensive MRSA colonization surveillance program. It is not intended to diagnose MRSA infection nor to guide or monitor treatment for MRSA infections. Test performance is not FDA approved in patients less than 44 years old. Performed at [facility], [address] [place], [place] [address]     Labs: CBC: Recent Labs  Lab 04/15/24 [phone] 04/16/24 0057 04/17/24 0646 04/18/24 0513  WBC 19.5* 14.1* 12.6* 11.4*  HGB 9.8* 9.3* 10.3* 10.6*  HCT 28.9* 27.9* 30.3* 31.4*  MCV 92.0 95.5 94.1 94.3  PLT 686*  560* 478* 412*   Basic Metabolic Panel: Recent Labs  Lab 04/14/24 1956 04/14/24 2208 04/15/24 0318 04/15/24 0632 04/15/24 1548 04/16/24 0057 04/17/24 0646 04/18/24 0513  NA 147* 151*   < > 145 147* 143 138 138  K 3.2* 3.0*   < > 4.0 3.6 4.1 4.0 4.0  CL 111 113*   < > 110 112* 108 105 103  CO2 21* 24   < > 29 27 27 24 26   GLUCOSE 467* 283*   < > 218* 95 241* 271* 233*  BUN 23* 21*   < > 17 14 13 16 17   CREATININE 1.26* 1.14*   < > 1.10* 1.01* 1.04* 0.92 0.95  CALCIUM  9.9 10.0   < > 9.1 8.7* 8.7* 9.0 8.9  MG 2.3  --   --   --   --   --   --   --   PHOS  --  <1.0*  --  2.6  --   --   --   --    < > = values in this interval not displayed.   Liver Function Tests: No results for input(s): AST, ALT, ALKPHOS, BILITOT, PROT, ALBUMIN  in the last 168 hours. CBG: Recent Labs  Lab 04/20/24 1538 04/20/24 2217 04/21/24 0448 04/21/24 0925 04/21/24 1253  GLUCAP 241* 313* 314* 362* 323*    Discharge time spent: greater than 30 minutes.  Signed: AIDA CHO, MD [facility] 04/21/2024 "

## 2024-04-21 NOTE — Progress Notes (Addendum)
 Ride share called the desk to notify staff they were at hospital. Harlene NT went to both ED entrance and Medical mall. No vehicle was waiting. This RN called [employer] at [phone] and provided the 2nd [place]ervation [place] of [id number]. The operator at [employer], Randine, stated that the ride had been cancelled. Requested her to resend the ride d/t the fact the patient and staff are now waiting 30 mins for ride at the medical mall entrance. New trip requested (Number [phone]) and run through [employer]. Lyft driver called desk to state that they would arrive in 6 minutes. Notified Marco NT via telephone of new ride and verified with Randine at [employer] that the ride was indeed 6 minutes away. Eva with [employer] notified staff that he was downstairs. Pt called back to floor to state that she did not want to ride in the [employer] with 2 men. Velvet CN spoke with patient on the phone, provided her with license plate number to verify that the car was correct. Pt decided to get in the car and is on the way to the facility now.

## 2024-04-21 NOTE — Progress Notes (Deleted)
 SATURATION QUALIFICATIONS: (This note is used to comply with regulatory documentation for home oxygen)   Patient Saturations on Room Air at Rest = 90%   Patient Saturations on [employer] while Stand, Pivot, Transfer to the Bedside Commode = 85%   Patient Saturations on 4 Liters of oxygen while Transferring = 95%   Please briefly explain why patient needs home oxygen:The patient has been on home oxygen chronically, but she states she has not been using portable oxygen when she is in the car going places, she only has an oxygen concentrator. Patient is not safe to transport home on room air, she needs a portable oxygen cylinder for when she is transporting.   [name] V [name]

## 2024-04-21 NOTE — Plan of Care (Signed)

## 2024-04-21 NOTE — Progress Notes (Signed)
 Ride setup a 2nd time. Hospital number provided to ride company again. Requested they call the floor for notification. Educated patient on the current plan.

## 2024-04-21 NOTE — TOC Progression Note (Addendum)
 Transition of Care [facility]) - Progression Note    Patient Details  Name: [name] MRN: [id number] Date of Birth: [birth year]  [facility]) CM/SW Contact  Shasta DELENA Daring, RN Phone Number: 04/21/2024, 12:34 PM  Clinical Narrative:     [facility]: [phone] [id number]  RNCM met with patient and her daughter. Agreed to send referral to [facility] in [facility] for review.  Sent referral and followed up with phone call. Was advised by admission director that they do not have any medicaid beds available. Called pateint's daughter and relayed info.  Daughter advised she is unable to provide transportation today and if the patient does not qualify for an ambulance ride then she will likely have to wait until tomorrow.  [place] I would notify MD and enquire about discharge readiness.  2:12 PM Discharge orders entered. Contacted Insurance mode of care with her insurance company and reqeusted transport to [employer] via [place]. Ride number [phone].  Notified daughter. Notified patient.                        Expected Discharge Plan and Services                                               Social Drivers of Health (SDOH) Interventions SDOH Screenings   Depression [phone]): Low Risk (11/28/2021)  Tobacco Use: Medium Risk (04/15/2024)    Readmission Risk Interventions    10/28/2021   10:28 AM  Readmission Risk Prevention Plan  Post Dischage Appt Complete  Medication Screening Complete  Transportation Screening Complete

## 2024-04-21 NOTE — TOC Progression Note (Signed)
 Transition of Care [facility]) - Progression Note    Patient Details  Name: BINTOU LAFATA MRN: [id number] Date of Birth: 06-18-1965  Transition of Care [facility]) CM/SW Contact  Erlean Mealor L Mardi [place], [place] Phone Number: 04/21/2024, 5:50 PM  Clinical Narrative:     CSW contacted [employer] [phone] [facility] [phone]) to advise that driver left the patient. CSW provided the number to the nurses station. Reservation rescheduled and patient should expect a ride within an hour. The rep advised that a driver has already picked up the trip and should be at [facility] within the hour.   CSW reiterated that the nurses station needs to be called to alert staff to arrival.   Secure chat updated and medical staff notified.    Barriers to Discharge: Barriers Resolved               Expected Discharge Plan and Services         Expected Discharge Date: 04/21/24                                     Social Drivers of Health (SDOH) Interventions SDOH Screenings   Depression (PHQ2-9): Low Risk (11/28/2021)  Tobacco Use: Medium Risk (04/15/2024)    Readmission Risk Interventions    10/28/2021   10:28 AM  Readmission Risk Prevention Plan  Post Dischage Appt Complete  Medication Screening Complete  Transportation Screening Complete

## 2024-04-21 NOTE — Plan of Care (Signed)
